# Patient Record
Sex: Female | Born: 1950 | Race: White | Hispanic: No | Marital: Married | Smoking: Never smoker
Health system: Southern US, Community
[De-identification: ages and names within clinical notes are randomized; demographics above are authoritative.]

## PROBLEM LIST (undated history)

## (undated) DIAGNOSIS — M1990 Unspecified osteoarthritis, unspecified site: Secondary | ICD-10-CM

## (undated) DIAGNOSIS — M19079 Primary osteoarthritis, unspecified ankle and foot: Secondary | ICD-10-CM

## (undated) DIAGNOSIS — K2270 Barrett's esophagus without dysplasia: Secondary | ICD-10-CM

## (undated) DIAGNOSIS — N951 Menopausal and female climacteric states: Secondary | ICD-10-CM

## (undated) DIAGNOSIS — J45990 Exercise induced bronchospasm: Secondary | ICD-10-CM

## (undated) DIAGNOSIS — Z860100 Personal history of colon polyps, unspecified: Secondary | ICD-10-CM

## (undated) DIAGNOSIS — IMO0002 Reserved for concepts with insufficient information to code with codable children: Secondary | ICD-10-CM

## (undated) DIAGNOSIS — N6019 Diffuse cystic mastopathy of unspecified breast: Secondary | ICD-10-CM

## (undated) DIAGNOSIS — D649 Anemia, unspecified: Secondary | ICD-10-CM

## (undated) DIAGNOSIS — B019 Varicella without complication: Secondary | ICD-10-CM

## (undated) DIAGNOSIS — D72819 Decreased white blood cell count, unspecified: Secondary | ICD-10-CM

## (undated) DIAGNOSIS — G4700 Insomnia, unspecified: Secondary | ICD-10-CM

## (undated) DIAGNOSIS — K5909 Other constipation: Secondary | ICD-10-CM

## (undated) DIAGNOSIS — E669 Obesity, unspecified: Secondary | ICD-10-CM

## (undated) DIAGNOSIS — I10 Essential (primary) hypertension: Secondary | ICD-10-CM

## (undated) DIAGNOSIS — K219 Gastro-esophageal reflux disease without esophagitis: Secondary | ICD-10-CM

## (undated) DIAGNOSIS — A630 Anogenital (venereal) warts: Secondary | ICD-10-CM

## (undated) DIAGNOSIS — C50919 Malignant neoplasm of unspecified site of unspecified female breast: Secondary | ICD-10-CM

## (undated) DIAGNOSIS — B059 Measles without complication: Secondary | ICD-10-CM

## (undated) DIAGNOSIS — B269 Mumps without complication: Secondary | ICD-10-CM

## (undated) DIAGNOSIS — E785 Hyperlipidemia, unspecified: Secondary | ICD-10-CM

## (undated) DIAGNOSIS — H269 Unspecified cataract: Secondary | ICD-10-CM

## (undated) DIAGNOSIS — F329 Major depressive disorder, single episode, unspecified: Secondary | ICD-10-CM

## (undated) DIAGNOSIS — J45909 Unspecified asthma, uncomplicated: Secondary | ICD-10-CM

## (undated) DIAGNOSIS — J40 Bronchitis, not specified as acute or chronic: Secondary | ICD-10-CM

## (undated) DIAGNOSIS — F419 Anxiety disorder, unspecified: Secondary | ICD-10-CM

## (undated) DIAGNOSIS — F3289 Other specified depressive episodes: Secondary | ICD-10-CM

## (undated) DIAGNOSIS — G43909 Migraine, unspecified, not intractable, without status migrainosus: Secondary | ICD-10-CM

## (undated) HISTORY — PX: OTHER SURGICAL HISTORY: SHX169

## (undated) HISTORY — DX: Unspecified cataract: H26.9

## (undated) HISTORY — PX: CHOLECYSTECTOMY: SHX55

## (undated) HISTORY — PX: NASAL SEPTUM SURGERY: SHX37

## (undated) HISTORY — DX: Menopausal and female climacteric states: N95.1

## (undated) HISTORY — DX: Essential (primary) hypertension: I10

## (undated) HISTORY — PX: COLONOSCOPY: SHX174

## (undated) HISTORY — DX: Obesity, unspecified: E66.9

## (undated) HISTORY — DX: Unspecified asthma, uncomplicated: J45.909

## (undated) HISTORY — DX: Measles without complication: B05.9

## (undated) HISTORY — DX: Anemia, unspecified: D64.9

## (undated) HISTORY — DX: Malignant neoplasm of unspecified site of unspecified female breast: C50.919

## (undated) HISTORY — PX: FOOT SURGERY: SHX648

## (undated) HISTORY — DX: Personal history of colonic polyps: Z86.010

## (undated) HISTORY — DX: Hyperlipidemia, unspecified: E78.5

## (undated) HISTORY — DX: Primary osteoarthritis, unspecified ankle and foot: M19.079

## (undated) HISTORY — DX: Varicella without complication: B01.9

## (undated) HISTORY — PX: CATARACT EXTRACTION, BILATERAL: SHX1313

## (undated) HISTORY — DX: Unspecified osteoarthritis, unspecified site: M19.90

## (undated) HISTORY — DX: Other specified depressive episodes: F32.89

## (undated) HISTORY — DX: Decreased white blood cell count, unspecified: D72.819

## (undated) HISTORY — DX: Diffuse cystic mastopathy of unspecified breast: N60.19

## (undated) HISTORY — PX: EYE SURGERY: SHX253

## (undated) HISTORY — PX: JOINT REPLACEMENT: SHX530

## (undated) HISTORY — PX: BUNIONECTOMY: SHX129

## (undated) HISTORY — DX: Mumps without complication: B26.9

## (undated) HISTORY — DX: Anogenital (venereal) warts: A63.0

## (undated) HISTORY — DX: Gastro-esophageal reflux disease without esophagitis: K21.9

## (undated) HISTORY — DX: Anxiety disorder, unspecified: F41.9

## (undated) HISTORY — DX: Insomnia, unspecified: G47.00

## (undated) HISTORY — PX: BREAST SURGERY: SHX581

## (undated) HISTORY — PX: TOTAL HIP ARTHROPLASTY: SHX124

## (undated) HISTORY — DX: Reserved for concepts with insufficient information to code with codable children: IMO0002

## (undated) HISTORY — DX: Migraine, unspecified, not intractable, without status migrainosus: G43.909

## (undated) HISTORY — DX: Personal history of colon polyps, unspecified: Z86.0100

## (undated) HISTORY — DX: Barrett's esophagus without dysplasia: K22.70

## (undated) HISTORY — DX: Major depressive disorder, single episode, unspecified: F32.9

## (undated) HISTORY — DX: Exercise induced bronchospasm: J45.990

---

## 1976-06-22 HISTORY — PX: DILATION AND CURETTAGE OF UTERUS: SHX78

## 1998-01-02 ENCOUNTER — Other Ambulatory Visit: Admission: RE | Admit: 1998-01-02 | Discharge: 1998-01-02 | Payer: Self-pay | Admitting: Gastroenterology

## 1999-02-05 ENCOUNTER — Other Ambulatory Visit: Admission: RE | Admit: 1999-02-05 | Discharge: 1999-02-05 | Payer: Self-pay | Admitting: Obstetrics and Gynecology

## 2000-03-01 ENCOUNTER — Other Ambulatory Visit: Admission: RE | Admit: 2000-03-01 | Discharge: 2000-03-01 | Payer: Self-pay | Admitting: Gastroenterology

## 2000-09-30 ENCOUNTER — Encounter: Payer: Self-pay | Admitting: Orthopaedic Surgery

## 2000-10-07 ENCOUNTER — Inpatient Hospital Stay (HOSPITAL_COMMUNITY): Admission: RE | Admit: 2000-10-07 | Discharge: 2000-10-12 | Payer: Self-pay | Admitting: Orthopaedic Surgery

## 2001-04-25 ENCOUNTER — Other Ambulatory Visit: Admission: RE | Admit: 2001-04-25 | Discharge: 2001-04-25 | Payer: Self-pay | Admitting: Obstetrics and Gynecology

## 2002-04-27 ENCOUNTER — Other Ambulatory Visit: Admission: RE | Admit: 2002-04-27 | Discharge: 2002-04-27 | Payer: Self-pay | Admitting: Obstetrics and Gynecology

## 2003-05-22 ENCOUNTER — Other Ambulatory Visit: Admission: RE | Admit: 2003-05-22 | Discharge: 2003-05-22 | Payer: Self-pay | Admitting: Obstetrics and Gynecology

## 2009-04-22 LAB — HM MAMMOGRAPHY

## 2010-04-16 LAB — HM PAP SMEAR: HM Pap smear: NORMAL

## 2011-03-23 LAB — HM PAP SMEAR: HM Pap smear: NORMAL

## 2011-07-03 ENCOUNTER — Ambulatory Visit (INDEPENDENT_AMBULATORY_CARE_PROVIDER_SITE_OTHER): Payer: 59

## 2011-07-03 ENCOUNTER — Encounter: Payer: Self-pay | Admitting: Gastroenterology

## 2011-07-03 DIAGNOSIS — D649 Anemia, unspecified: Secondary | ICD-10-CM

## 2011-08-04 ENCOUNTER — Ambulatory Visit (AMBULATORY_SURGERY_CENTER): Payer: Self-pay | Admitting: *Deleted

## 2011-08-04 VITALS — Ht 64.0 in | Wt 154.0 lb

## 2011-08-04 DIAGNOSIS — Z1211 Encounter for screening for malignant neoplasm of colon: Secondary | ICD-10-CM

## 2011-08-04 MED ORDER — PEG-KCL-NACL-NASULF-NA ASC-C 100 G PO SOLR: ORAL | Status: DC

## 2011-08-05 LAB — LIPID PANEL
HDL: 88 mg/dL — AB (ref 35–70)
LDL Cholesterol: 95 mg/dL
Triglycerides: 60 mg/dL (ref 40–160)

## 2011-08-13 ENCOUNTER — Other Ambulatory Visit: Payer: Self-pay | Admitting: Gastroenterology

## 2011-08-21 LAB — HM COLONOSCOPY

## 2011-08-25 ENCOUNTER — Encounter: Payer: Self-pay | Admitting: Gastroenterology

## 2011-08-25 ENCOUNTER — Ambulatory Visit (AMBULATORY_SURGERY_CENTER): Payer: 59 | Admitting: Gastroenterology

## 2011-08-25 VITALS — BP 118/56 | HR 57 | Temp 96.3°F | Resp 18 | Ht 64.0 in | Wt 154.0 lb

## 2011-08-25 DIAGNOSIS — Z1211 Encounter for screening for malignant neoplasm of colon: Secondary | ICD-10-CM

## 2011-08-25 MED ORDER — SODIUM CHLORIDE 0.9 % IV SOLN: 500.0000 mL | INTRAVENOUS | Status: DC

## 2011-08-25 MED ORDER — DEXTROSE 5 % IV SOLN: INTRAVENOUS | Status: DC

## 2011-08-25 NOTE — Progress Notes (Signed)
Patient did not have preoperative order for IV antibiotic SSI prophylaxis. (G8918)  Patient did not experience any of the following events: a burn prior to discharge; a fall within the facility; wrong site/side/patient/procedure/implant event; or a hospital transfer or hospital admission upon discharge from the facility. (G8907)  

## 2011-08-25 NOTE — Progress Notes (Signed)
PT STATES THAT SHE HAS LOST 75LBS AND HER DIABETES IS UNDER CONTROL BUT SHE STILL TAKES HER METFORMIN HERE AND THERE. SHE HASNT TAKEN IT IN SEVERAL DAYS .  BLOOD SUGAR IS 75 IN THE ADMITTING AREA. PT DENIES ANY S/S. STATES SHE FEELS FINE. DR Russella Dar MADE AWARE OF SITUATION AND GAVE ORDER TO HANG D5W ONLY. EWM  REPEAT BLOOD SUGAR 109. PT CONTINUES TO STATE SHE IS ASYMPTOMATIC. EWM

## 2011-08-25 NOTE — Op Note (Signed)
[facility] [address] [place], [place]  [address]  COLONOSCOPY PROCEDURE REPORT  PATIENT:  Alisha, Matthews  MR#:  [id number] BIRTHDATE:  [birth year], 60 yrs. old  GENDER:  female ENDOSCOPIST:  Judie Petit T. Russella Dar, MD, [facility]  PROCEDURE DATE:  08/25/2011 PROCEDURE:  Colonoscopy 19147 ASA CLASS:  Class II INDICATIONS:  1) Routine Risk Screening MEDICATIONS:   MAC sedation, administered by CRNA, propofol (Diprivan) 120 mg IV DESCRIPTION OF PROCEDURE:   After the risks benefits and alternatives of the procedure were thoroughly explained, informed consent was obtained.  Digital rectal exam was performed and revealed external hemorrhoids.   The LB [id number]-[id number] endoscope was introduced through the anus and advanced to the cecum, which was identified by both the appendix and ileocecal valve, without limitations.  The quality of the prep was good, using MoviPrep.  The instrument was then slowly withdrawn as the colon was fully examined. <<PROCEDUREIMAGES>> FINDINGS:  Moderate diverticulosis was found in the sigmoid colon. Otherwise normal colonoscopy without other polyps, masses, vascular ectasias, or inflammatory changes.   Retroflexed views in the rectum revealed internal hemorrhoids, moderate.  The time to cecum =  3  minutes. The scope was then withdrawn (time =  11 min) from the patient and the procedure completed.  COMPLICATIONS:  None  ENDOSCOPIC IMPRESSION: 1) Moderate diverticulosis in the sigmoid colon 2) Internal and external hemorrhoids  RECOMMENDATIONS: 1) High fiber diet with liberal fluid intake. 2) Continue current colorectal screening for "routine risk" patients with a repeat colonoscopy in 10 years.  Venita Lick. Russella Dar, MD, Clement[facility] Graham  CC:  Nilda Simmer, MD  n. Rosalie DoctorVenita Lick. Tahjanae Blankenburg at 08/25/2011 11:18 AM  [place], [name], [place] [phone]

## 2011-08-25 NOTE — Patient Instructions (Signed)

## 2011-08-26 ENCOUNTER — Telehealth: Payer: Self-pay | Admitting: *Deleted

## 2011-08-26 NOTE — Telephone Encounter (Signed)
  Follow up Call-  Call back number 08/25/2011  Post procedure Call Back phone  # [phone]  Permission to leave phone message Yes     Patient questions:  Do you have a fever, pain , or abdominal swelling? no Pain Score  0 *  Have you tolerated food without any problems? yes  Have you been able to return to your normal activities? yes  Do you have any questions about your discharge instructions: Diet   no Medications  no Follow up visit  no  Do you have questions or concerns about your Care? no  Actions: * If pain score is 4 or above: No action needed, pain <4.

## 2011-11-04 LAB — BASIC METABOLIC PANEL
Glucose: 101 mg/dL
Potassium: 4.2 mmol/L (ref 3.4–5.3)

## 2011-11-04 LAB — HEMOGLOBIN A1C: Hgb A1c MFr Bld: 6 % (ref 4.0–6.0)

## 2011-11-04 LAB — LIPID PANEL
LDL Cholesterol: 98 mg/dL
Triglycerides: 61 mg/dL (ref 40–160)

## 2011-11-04 LAB — CBC AND DIFFERENTIAL
HCT: 39 % (ref 36–46)
Neutrophils Absolute: 1 /uL
Platelets: 202 10*3/uL (ref 150–399)
WBC: 3.3 10^3/mL

## 2012-01-31 ENCOUNTER — Encounter: Payer: Self-pay | Admitting: Family Medicine

## 2012-02-02 ENCOUNTER — Encounter: Payer: Self-pay | Admitting: *Deleted

## 2012-02-08 ENCOUNTER — Ambulatory Visit (INDEPENDENT_AMBULATORY_CARE_PROVIDER_SITE_OTHER): Payer: 59 | Admitting: Family Medicine

## 2012-02-08 VITALS — BP 112/70 | HR 68 | Temp 99.0°F | Resp 18 | Ht 62.5 in | Wt 149.0 lb

## 2012-02-08 DIAGNOSIS — I10 Essential (primary) hypertension: Secondary | ICD-10-CM

## 2012-02-08 DIAGNOSIS — E7800 Pure hypercholesterolemia, unspecified: Secondary | ICD-10-CM

## 2012-02-08 DIAGNOSIS — F411 Generalized anxiety disorder: Secondary | ICD-10-CM

## 2012-02-08 DIAGNOSIS — E119 Type 2 diabetes mellitus without complications: Secondary | ICD-10-CM

## 2012-02-08 DIAGNOSIS — E785 Hyperlipidemia, unspecified: Secondary | ICD-10-CM

## 2012-02-08 DIAGNOSIS — F329 Major depressive disorder, single episode, unspecified: Secondary | ICD-10-CM

## 2012-02-08 LAB — CK: Total CK: 153 U/L (ref 7–177)

## 2012-02-08 LAB — CBC WITH DIFFERENTIAL/PLATELET
HCT: 36.8 % (ref 36.0–46.0)
Hemoglobin: 12.6 g/dL (ref 12.0–15.0)
Lymphs Abs: 1.6 10*3/uL (ref 0.7–4.0)
Monocytes Absolute: 0.3 10*3/uL (ref 0.1–1.0)
Monocytes Relative: 8 % (ref 3–12)
Neutro Abs: 1.7 10*3/uL (ref 1.7–7.7)
Neutrophils Relative %: 45 % (ref 43–77)
RBC: 4.27 MIL/uL (ref 3.87–5.11)

## 2012-02-08 LAB — COMPREHENSIVE METABOLIC PANEL
AST: 24 U/L (ref 0–37)
Alkaline Phosphatase: 53 U/L (ref 39–117)
BUN: 7 mg/dL (ref 6–23)
Creat: 0.69 mg/dL (ref 0.50–1.10)
Potassium: 4.3 mEq/L (ref 3.5–5.3)
Sodium: 136 mEq/L (ref 135–145)
Total Bilirubin: 0.7 mg/dL (ref 0.3–1.2)

## 2012-02-08 LAB — LIPID PANEL: HDL: 79 mg/dL (ref >39)

## 2012-02-08 LAB — POCT GLYCOSYLATED HEMOGLOBIN (HGB A1C): Hemoglobin A1C: 5.7

## 2012-02-08 NOTE — Progress Notes (Signed)
Subjective:    Patient ID: Alisha Matthews, female    DOB: 1950/07/05, 61 y.o.   MRN: [id number]  HPIThis 61 y.o. female presents for three month follow-up for the following:  1.  DMII:  No change in medications at last visit three months ago; taking500mg  Metformin daily; will take one extra tablet if splurges. Last HgbA1c of 6.0.  Compliance with medication; good tolerance of medication; good symptom control.  No hypoglycemic episodes.  Denies polydipsia, polyuria, weight changes, numbness/tingling of extremities.   Exercises stair stepper daily.    2. Hyperlipidemia:  Last visit three months ago; no changes to management made at last visit; reports good compliance with medication, good tolerance of medication, and good symptom control. Denies chest pain, palpitations, DOE, leg swelling, headache, dizziness, focal weakness, or numbness/tingling.  3.  Depression: three month follow-up for depression; plan at last visit was to decrease Paxil to 40mg  1/2 tablet daily; always worried about husband and his commute.  Going down to 2 days of work per week on 06/2012.  Taking Paxil 20mg  daily.  Taking Benadryl and Xanax alternating for insomnia; needs to sleep.  Excessive worry.  Working 32 hours per week currently; work is main stressor at this time.  Has started painting again; also taking [place] lessons with husband; plans to take dance lessons with husband.  Has lots of plans when goes down to part time.  Wants husband to retire in 07/2012.  4.  Migraines: having one every three months due to intensity of the job; stress is job related.  Hopes that decreasing number of work hours per week will decrease stress.  5.  HTN: three month follow-up of HTN; home/work blood pressure running 116/80-100/68.  Rare dizziness.  Compliance with medication; good tolerance of medication; good symptom control.      Review of Systems  Constitutional: Negative for fever, fatigue and unexpected weight change.  Eyes:  Negative for visual disturbance.  Respiratory: Negative for cough, chest tightness and shortness of breath.   Cardiovascular: Negative for chest pain, palpitations and leg swelling.  Musculoskeletal: Positive for arthralgias.  Neurological: Positive for headaches. Negative for dizziness, syncope, facial asymmetry, speech difficulty, weakness, light-headedness and numbness.  Psychiatric/Behavioral: Positive for disturbed wake/sleep cycle. Negative for suicidal ideas, self-injury and dysphoric mood. The patient is nervous/anxious.     Past Medical History  Diagnosis Date  . Anxiety   . Arthritis   . Diabetes mellitus     borderline  . GERD (gastroesophageal reflux disease)   . Hyperlipidemia   . Insomnia, unspecified   . Leukocytopenia, unspecified   . Barrett's esophagus   . Breast fibrocystic disorder   . Genital warts   . Osteoarthrosis, unspecified whether generalized or localized, ankle and foot     ankle/foot  . Exercise induced bronchospasm   . Personal history of colonic polyps   . Migraine, unspecified, without mention of intractable migraine without mention of status migrainosus   . Symptomatic menopausal or female climacteric states   . Obesity, unspecified   . Essential hypertension, benign   . Depressive disorder, not elsewhere classified   . Anemia, unspecified   . Chicken pox   . Measles   . Mumps     Past Surgical History  Procedure Date  . Cholecystectomy   . Bunionectomy     3 surgeries on both feet  . Nasal septum surgery   . Total hip arthroplasty     left      Perthes disease  .  Foot surgery     Left-revision  . Dilation and curettage of uterus 1978    Prior to Admission medications   Medication Sig Start Date End Date Taking? Authorizing Provider  albuterol (PROVENTIL HFA;VENTOLIN HFA) 108 (90 BASE) MCG/ACT inhaler Inhale 2 puffs into the lungs every 6 (six) hours as needed. Asthmatic attack   Yes Historical Provider, MD  ALPRAZolam Prudy Feeler) 0.5  MG tablet Take 0.5 mg by mouth at bedtime as needed.   Yes Historical Provider, MD  atorvastatin (LIPITOR) 40 MG tablet Take 40 mg by mouth daily.   Yes Historical Provider, MD  clindamycin (CLEOCIN) 150 MG capsule Take 150 mg by mouth as needed. Takes 4 as needed prior to dental appt   Yes Historical Provider, MD  diclofenac sodium (VOLTAREN) 1 % GEL Apply 1 application topically as needed. Joint pain ankle   Yes Historical Provider, MD  Diclofenac-Misoprostol (ARTHROTEC PO) Take 1 tablet by mouth 3 (three) times daily.   Yes Historical Provider, MD  diphenhydrAMINE (BENADRYL) 25 MG tablet Take 25 mg by mouth at bedtime as needed. When not taking xanax.   Yes Historical Provider, MD  esomeprazole (NEXIUM) 40 MG capsule Take 40 mg by mouth 2 (two) times daily.   Yes Historical Provider, MD  estradiol (ESTRACE) 0.1 MG/GM vaginal cream Place 2 g vaginally once a week.   Yes Historical Provider, MD  frovatriptan (FROVA) 2.5 MG tablet Take 2.5 mg by mouth as needed. If recurs, may repeat after 2 hours. Max of 3 tabs in 24 hours.   Yes Historical Provider, MD  lisinopril (PRINIVIL,ZESTRIL) 5 MG tablet Take 5 mg by mouth daily.   Yes Historical Provider, MD  metFORMIN (GLUMETZA) 500 MG (MOD) 24 hr tablet Take 500 mg by mouth daily with breakfast.   Yes Historical Provider, MD  PARoxetine (PAXIL) 40 MG tablet Take 40 mg by mouth daily.   Yes Historical Provider, MD  aspirin 81 MG tablet Take 81 mg by mouth daily.    Historical Provider, MD    Allergies  Allergen Reactions  . Penicillins Rash    History   Social History  . Marital Status: Married    Spouse Name: N/A    Number of Children: 0  . 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 Smoking status: Never Smoker   . Smokeless tobacco: Never Used  . Alcohol Use: No  . Drug Use: No  . Sexually Active: Not on file   Other Topics Concern  . Not on file   Social  History Narrative   Married x 16 years happily, second marriage. No domestic abuse; 6 cats; no children. Moderate caffeine use. Exercise plan: gardening and walking.    Family History  Problem Relation Age of Onset  . Hypertension Mother   . Diabetes Mother   . Heart failure Father   . Heart failure Mother   . Depression Brother        Objective:   Physical Exam  Constitutional: She is oriented to person, place, and time. She appears well-developed and well-nourished. No distress.  HENT:  Head: Normocephalic and atraumatic.  Eyes: Conjunctivae and EOM are normal. Pupils are equal, round, and reactive to light.  Neck: Normal range of motion. Neck supple. No thyromegaly present.  Cardiovascular: Normal rate, regular rhythm, normal heart sounds and intact distal pulses.   No murmur heard. Pulmonary/Chest: Effort normal and breath sounds normal.  Abdominal: Soft. Bowel sounds are normal. She exhibits no distension and no mass. There is no tenderness. There is no rebound and no guarding.  Lymphadenopathy:    She has no cervical adenopathy.  Neurological: She is alert and oriented to person, place, and time. No cranial nerve deficit. She exhibits normal muscle tone.  Skin: Skin is warm and dry. She is not diaphoretic.  Psychiatric: She has a normal mood and affect. Her behavior is normal. Judgment and thought content normal.           Results for orders placed in visit on 02/08/12  CBC WITH DIFFERENTIAL      Component Value Range   WBC 3.7 (*) 4.0 - 10.5 K/uL   RBC 4.27  3.87 - 5.11 MIL/uL   Hemoglobin 12.6  12.0 - 15.0 g/dL   HCT 91.4  78.2 - 95.6 %   MCV 86.2  78.0 - 100.0 fL   MCH 29.5  26.0 - 34.0 pg   MCHC 34.2  30.0 - 36.0 g/dL   RDW 21.3  08.6 - 57.8 %   Platelets 202  150 - 400 K/uL   Neutrophils Relative 45  43 - 77 %   Neutro Abs 1.7  1.7 - 7.7 K/uL   Lymphocytes Relative 43  12 - 46 %   Lymphs Abs 1.6  0.7 - 4.0 K/uL   Monocytes Relative 8  3 - 12 %    Monocytes Absolute 0.3  0.1 - 1.0 K/uL   Eosinophils Relative 3  0 - 5 %   Eosinophils Absolute 0.1  0.0 - 0.7 K/uL   Basophils Relative 1  0 - 1 %   Basophils Absolute 0.0  0.0 - 0.1 K/uL   Smear Review Criteria for review not met    CK      Component Value Range   Total CK 153  7 - 177 U/L  COMPREHENSIVE METABOLIC PANEL      Component Value Range   Sodium 136  135 - 145 mEq/L   Potassium 4.3  3.5 - 5.3 mEq/L   Chloride 102  96 - 112 mEq/L   CO2 28  19 - 32 mEq/L   Glucose, Bld 95  70 - 99 mg/dL   BUN 7  6 - 23 mg/dL   Creat 4.69  6.29 - 5.28 mg/dL   Total Bilirubin 0.7  0.3 - 1.2 mg/dL   Alkaline Phosphatase 53  39 - 117 U/L   AST 24  0 - 37 U/L   ALT 22  0 - 35 U/L   Total Protein 6.7  6.0 - 8.3 g/dL   Albumin 4.4  3.5 - 5.2 g/dL   Calcium 9.3  8.4 - 41.3 mg/dL  POCT GLYCOSYLATED HEMOGLOBIN (HGB A1C)      Component Value Range   Hemoglobin A1C 5.7    LIPID PANEL      Component Value Range   Cholesterol 186  0 - 200 mg/dL   Triglycerides 75  <244 mg/dL   HDL 79  >01 mg/dL   Total CHOL/HDL Ratio 2.4     VLDL 15  0 - 40 mg/dL   LDL Cholesterol 92  0 - 99 mg/dL   Assessment & Plan:   1. Diabetes mellitus  CBC with Differential, CK, Comprehensive metabolic panel, HgB A1c  2. Hyperlipidemia  CBC with Differential, CK, Comprehensive metabolic panel, Lipid panel  3. Hypertension    4. Anxiety and depression  1. DMII:  Controlled; no change in medications; obtain labs.  F/u 3 months. 2.  HTN: controlled; no change in medications; obtain labs; goal BP<130/80 due to DMII. 3.  Hyperlipidemia:  Controlled; no change in medications; obtain labs.  Goal LDL< 100 due to DMII. 4.  Depression with anxiety: controlled; no change in medications; continue Paxil 20mg  daily.  Continue Xanax alternating with Benadryl qhs PRN insomnia.  Plans to decrease work hours to two days per week in 06/2012.

## 2012-02-11 NOTE — Progress Notes (Signed)
Reviewed and agree.

## 2012-04-25 ENCOUNTER — Telehealth: Payer: Self-pay | Admitting: Family Medicine

## 2012-04-25 NOTE — Telephone Encounter (Signed)
DR Katrinka Blazing,  THIS IS JUST A TEST

## 2012-05-09 ENCOUNTER — Ambulatory Visit (INDEPENDENT_AMBULATORY_CARE_PROVIDER_SITE_OTHER): Payer: 59 | Admitting: Family Medicine

## 2012-05-09 ENCOUNTER — Encounter: Payer: Self-pay | Admitting: Family Medicine

## 2012-05-09 VITALS — BP 106/74 | HR 70 | Temp 99.4°F | Resp 16 | Ht 62.5 in | Wt 149.0 lb

## 2012-05-09 DIAGNOSIS — E119 Type 2 diabetes mellitus without complications: Secondary | ICD-10-CM

## 2012-05-09 DIAGNOSIS — F418 Other specified anxiety disorders: Secondary | ICD-10-CM

## 2012-05-09 DIAGNOSIS — F341 Dysthymic disorder: Secondary | ICD-10-CM

## 2012-05-09 DIAGNOSIS — I10 Essential (primary) hypertension: Secondary | ICD-10-CM

## 2012-05-09 DIAGNOSIS — E7800 Pure hypercholesterolemia, unspecified: Secondary | ICD-10-CM

## 2012-05-09 LAB — CBC WITH DIFFERENTIAL/PLATELET
Eosinophils Relative: 2 % (ref 0–5)
HCT: 36 % (ref 36.0–46.0)
Lymphocytes Relative: 48 % — ABNORMAL HIGH (ref 12–46)
Lymphs Abs: 2 10*3/uL (ref 0.7–4.0)
MCV: 88 fL (ref 78.0–100.0)
Monocytes Absolute: 0.3 10*3/uL (ref 0.1–1.0)
RDW: 12.9 % (ref 11.5–15.5)
WBC: 4.3 10*3/uL (ref 4.0–10.5)

## 2012-05-09 LAB — COMPREHENSIVE METABOLIC PANEL
Albumin: 4.5 g/dL (ref 3.5–5.2)
BUN: 9 mg/dL (ref 6–23)
CO2: 28 mEq/L (ref 19–32)
Calcium: 9.4 mg/dL (ref 8.4–10.5)
Chloride: 102 mEq/L (ref 96–112)
Glucose, Bld: 95 mg/dL (ref 70–99)
Potassium: 4 mEq/L (ref 3.5–5.3)

## 2012-05-09 LAB — HEMOGLOBIN A1C: Mean Plasma Glucose: 128 mg/dL — ABNORMAL HIGH (ref <117)

## 2012-05-09 LAB — LIPID PANEL: Cholesterol: 181 mg/dL (ref 0–200)

## 2012-05-09 NOTE — Patient Instructions (Addendum)
1. Type II or unspecified type diabetes mellitus without mention of complication, not stated as uncontrolled  CBC with Differential, Hemoglobin A1c  2. Pure hypercholesterolemia  Lipid panel  3. Essential hypertension, benign  CK, Comprehensive metabolic panel  4. Depression with anxiety

## 2012-05-09 NOTE — Progress Notes (Signed)
[address]   [place], [place]  [address]   [phone]  Subjective:    Patient ID: Alisha Matthews, female    DOB: 1950-09-06, 61 y.o.   MRN: [id number]  HPIThis 61 y.o. female presents for evaluation of the following:  1.  DMII:  Three month follow-up; no changes to management made at last visit.  Not checking sugars regularly.  Reports good compliance with medication; good tolerance to medication; good symptom control.  2.  HTN:  Three month follow-up; no changes to management made at last visit; reports good compliance and good tolerance to medications.  3.  Hyperlipidemia:  Three month follow-up; reports good tolerance to medication; good symptom control; good compliance.  Asymptomatic.  4.  Depression with anxiety:  Lots of stressors and struggling with stressors.  Previously worked at [employer]; great team work; worked there x 15 years.  Faith helps with positive attitude.  Increased Paxil to 40mg  daily.  Had a rough time.  Still struggling with death of father.   Review of Systems  Constitutional: Negative for fever, chills, diaphoresis and fatigue.  Respiratory: Negative for shortness of breath, wheezing and stridor.   Cardiovascular: Negative for chest pain, palpitations and leg swelling.  Gastrointestinal: Negative for nausea, vomiting, abdominal pain and constipation.  Skin: Negative for rash and wound.  Neurological: Negative for dizziness, tremors, syncope, facial asymmetry, speech difficulty, weakness, light-headedness, numbness and headaches.  Psychiatric/Behavioral: Positive for sleep disturbance and dysphoric mood. Negative for suicidal ideas and self-injury. The patient is nervous/anxious.         Past Medical History  Diagnosis Date  . Anxiety   . Arthritis   . Diabetes mellitus     borderline  . GERD (gastroesophageal reflux disease)   . Hyperlipidemia   . Insomnia, unspecified   . Leukocytopenia, unspecified   . Barrett's esophagus   . Breast  fibrocystic disorder   . Genital warts   . Osteoarthrosis, unspecified whether generalized or localized, ankle and foot     ankle/foot  . Exercise induced bronchospasm   . Personal history of colonic polyps   . Migraine, unspecified, without mention of intractable migraine without mention of status migrainosus   . Symptomatic menopausal or female climacteric states   . Obesity, unspecified   . Essential hypertension, benign   . Depressive disorder, not elsewhere classified   . Anemia, unspecified   . Chicken pox   . Measles   . Mumps     Past Surgical History  Procedure Date  . Cholecystectomy   . Bunionectomy     3 surgeries on both feet  . Nasal septum surgery   . Total hip arthroplasty     left      Perthes disease  . Foot surgery     Left-revision  . Dilation and curettage of uterus 1978    Prior to Admission medications   Medication Sig Start Date End Date Taking? Authorizing Provider  albuterol (PROVENTIL HFA;VENTOLIN HFA) 108 (90 BASE) MCG/ACT inhaler Inhale 2 puffs into the lungs every 6 (six) hours as needed. Asthmatic attack   Yes Historical Provider, MD  ALPRAZolam Prudy Feeler) 0.5 MG tablet Take 0.5 mg by mouth at bedtime as needed.   Yes Historical Provider, MD  aspirin 81 MG tablet Take 81 mg by mouth daily.   Yes Historical Provider, MD  atorvastatin (LIPITOR) 40 MG tablet Take 40 mg by mouth daily.   Yes Historical Provider, MD  clindamycin (CLEOCIN) 150 MG capsule  Take 150 mg by mouth as needed. Takes 4 as needed prior to dental appt   Yes Historical Provider, MD  diclofenac sodium (VOLTAREN) 1 % GEL Apply 1 application topically as needed. Joint pain ankle   Yes Historical Provider, MD  Diclofenac-Misoprostol (ARTHROTEC PO) Take 1 tablet by mouth 3 (three) times daily.   Yes Historical Provider, MD  diphenhydrAMINE (BENADRYL) 25 MG tablet Take 25 mg by mouth at bedtime as needed. When not taking xanax.   Yes Historical Provider, MD  esomeprazole (NEXIUM) 40 MG  capsule Take 40 mg by mouth 2 (two) times daily.   Yes Historical Provider, MD  estradiol (ESTRACE) 0.1 MG/GM vaginal cream Place 2 g vaginally once a week.   Yes Historical Provider, MD  frovatriptan (FROVA) 2.5 MG tablet Take 2.5 mg by mouth as needed. If recurs, may repeat after 2 hours. Max of 3 tabs in 24 hours.   Yes Historical Provider, MD  lisinopril (PRINIVIL,ZESTRIL) 5 MG tablet Take 5 mg by mouth daily.   Yes Historical Provider, MD  metFORMIN (GLUMETZA) 500 MG (MOD) 24 hr tablet Take 500 mg by mouth daily with breakfast.   Yes Historical Provider, MD  PARoxetine (PAXIL) 40 MG tablet Take 40 mg by mouth daily.   Yes Historical Provider, MD    Allergies  Allergen Reactions  . Penicillins Rash    History   Social History  . Marital Status: Married    Spouse Name: N/A    Number of Children: 0  . 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 Smoking status: Never Smoker   . Smokeless tobacco: Never Used  . Alcohol Use: No  . Drug Use: No  . Sexually Active: Not on file   Other Topics Concern  . Not on file   Social History Narrative   Married x 16 years happily, second marriage. No domestic abuse; 6 cats; no children. Moderate caffeine use. Exercise plan: gardening and walking.    Family History  Problem Relation Age of Onset  . Hypertension Mother   . Diabetes Mother   . Heart failure Father   . Heart failure Mother   . Depression Brother     Objective:   Physical Exam  Nursing note and vitals reviewed. Constitutional: She is oriented to person, place, and time. She appears well-developed and well-nourished. No distress.  Eyes: Conjunctivae are normal. Pupils are equal, round, and reactive to light.  Neck: Normal range of motion. Neck supple. No JVD present. No thyromegaly present.  Cardiovascular: Normal rate, regular rhythm, normal heart sounds and intact distal pulses.  Exam reveals  no gallop and no friction rub.   No murmur heard. Pulmonary/Chest: Effort normal and breath sounds normal. She has no wheezes. She has no rales.  Abdominal: Soft. Bowel sounds are normal. There is no tenderness. There is no rebound and no guarding.  Lymphadenopathy:    She has no cervical adenopathy.  Neurological: She is alert and oriented to person, place, and time. No cranial nerve deficit. She exhibits normal muscle tone. Coordination normal.  Skin: Skin is warm and dry. No rash noted. She is not diaphoretic. No erythema.  Psychiatric: She has a normal mood and affect. Her behavior is normal. Judgment and thought content normal.       Assessment & Plan:  Type II or unspecified type diabetes mellitus without mention of complication, not stated as uncontrolled - Plan: CBC  with Differential, Hemoglobin A1c  Pure hypercholesterolemia - Plan: Lipid panel  Essential hypertension, benign - Plan: CK, Comprehensive metabolic panel  Depression with anxiety   1.  DMII: controlled; obtain labs; continue current medications. 2.  Hyperlipidemia:  Controlled; obtain labs; continue current medications. 3.  HTN: controlled; obtain labs; continue current medications. 4.  Depression with anxiety: worsening which is expected with multiple stressors over past 3 years; counseling provided; no change to medications at this time.   No orders of the defined types were placed in this encounter.

## 2012-05-12 ENCOUNTER — Encounter: Payer: Self-pay | Admitting: Radiology

## 2012-05-12 ENCOUNTER — Other Ambulatory Visit: Payer: Self-pay | Admitting: Radiology

## 2012-05-16 ENCOUNTER — Other Ambulatory Visit (HOSPITAL_COMMUNITY): Payer: Self-pay | Admitting: Radiology

## 2012-05-16 DIAGNOSIS — C50919 Malignant neoplasm of unspecified site of unspecified female breast: Secondary | ICD-10-CM

## 2012-05-20 ENCOUNTER — Ambulatory Visit (HOSPITAL_COMMUNITY)
Admission: RE | Admit: 2012-05-20 | Discharge: 2012-05-20 | Disposition: A | Discharge status: Home / Self Care | Payer: 59 | Source: Ambulatory Visit | Attending: Radiology | Admitting: Radiology

## 2012-05-20 ENCOUNTER — Ambulatory Visit (HOSPITAL_COMMUNITY): Admission: RE | Admit: 2012-05-20 | Payer: 59 | Source: Ambulatory Visit

## 2012-05-20 DIAGNOSIS — C50919 Malignant neoplasm of unspecified site of unspecified female breast: Secondary | ICD-10-CM | POA: Insufficient documentation

## 2012-05-20 MED ORDER — GADOBENATE DIMEGLUMINE 529 MG/ML IV SOLN
13.0000 mL | Freq: Once | INTRAVENOUS | Status: AC | PRN
  Administered 2012-05-20: 13 mL via INTRAVENOUS

## 2012-05-21 ENCOUNTER — Other Ambulatory Visit: Payer: Self-pay | Admitting: *Deleted

## 2012-05-21 ENCOUNTER — Ambulatory Visit (INDEPENDENT_AMBULATORY_CARE_PROVIDER_SITE_OTHER): Payer: 59 | Admitting: Physician Assistant

## 2012-05-21 VITALS — BP 106/72 | HR 70 | Temp 98.6°F | Resp 16 | Ht 63.0 in | Wt 148.0 lb

## 2012-05-21 DIAGNOSIS — N61 Mastitis without abscess: Secondary | ICD-10-CM

## 2012-05-21 DIAGNOSIS — N611 Abscess of the breast and nipple: Secondary | ICD-10-CM

## 2012-05-21 DIAGNOSIS — B9562 Methicillin resistant Staphylococcus aureus infection as the cause of diseases classified elsewhere: Secondary | ICD-10-CM

## 2012-05-21 MED ORDER — CEPHALEXIN 500 MG PO CAPS: 500.0000 mg | ORAL_CAPSULE | Freq: Three times a day (TID) | ORAL | Status: DC

## 2012-05-21 NOTE — Progress Notes (Signed)
   [address] [place] [address]   Phone [phone]  Subjective:    Patient ID: Alisha Matthews, female    DOB: Jul 27, 1950, 61 y.o.   MRN: [id number]  HPI  Pt presents to clinic with concerns about infection at biopsy site on R breast.  Pt had a bx last week and was diagnosed with invasive breast cancer - in situ and took the steri strip off and noticed some yellow d/c.  She is scheduled to see the surgeon on Monday and is worried to wait that long.  She otherwise feels fine, without fever, just local discomfort at the biopsy site.  She is sad and mad about her diagnosis.  She is unable to concentrate since being told.  It was found on routine mammogram screening.  Review of Systems  Constitutional: Negative for fever and chills.  Skin: Positive for wound.       Objective:   Physical Exam  Constitutional: She appears well-developed and well-nourished.       Tearful at times.  HENT:  Head: Normocephalic and atraumatic.  Right Ear: External ear normal.  Left Ear: External ear normal.  Pulmonary/Chest: Effort normal.  Skin:       Ecchymosis inferior to biopsy site.  Biopsy site - with pressure yellow purulence expressed.  Minimal erythema around biopsy site.  Some minimal induration.       Assessment & Plan:   1. Cellulitis and abscess of breast  Wound culture, DISCONTINUED: cephALEXin (KEFLEX) 500 MG capsule   Warm compresses.  Keflex 500mg  tid for 10 days.  Continue with plans for surgeon appt on Monday.  Answered pt questions.  Reassured pt that her currently feelings regarding her diagnosis are normal.

## 2012-05-23 ENCOUNTER — Ambulatory Visit (INDEPENDENT_AMBULATORY_CARE_PROVIDER_SITE_OTHER): Payer: Commercial Managed Care - PPO | Admitting: General Surgery

## 2012-05-23 ENCOUNTER — Encounter (INDEPENDENT_AMBULATORY_CARE_PROVIDER_SITE_OTHER): Payer: Self-pay | Admitting: General Surgery

## 2012-05-23 ENCOUNTER — Telehealth (INDEPENDENT_AMBULATORY_CARE_PROVIDER_SITE_OTHER): Payer: Self-pay | Admitting: General Surgery

## 2012-05-23 VITALS — BP 130/88 | HR 67 | Temp 98.2°F | Ht 64.0 in | Wt 153.2 lb

## 2012-05-23 DIAGNOSIS — C50219 Malignant neoplasm of upper-inner quadrant of unspecified female breast: Secondary | ICD-10-CM

## 2012-05-23 NOTE — Progress Notes (Signed)
Patient ID: Alisha Matthews, female   DOB: 05-08-1951, 61 y.o.   MRN: [id number]  Chief Complaint  Patient presents with  . Pre-op Exam    eval Rt br ca    HPI Alisha Matthews is a 61 y.o. female.  Referred by Dr. Tilda Burrow HPI 32 yof with no history related to breast or any breast related complaints who underwent routine screening mmg that showed new calcifications on the right.  Additional views shoed a cluster of numerous mildly pleomorphic calcifications.  Stereotactic biopsy was performed with clip placement.  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  This is 100% er positive, 22% pr positive and Ki67 is 6%.  Her2 neu is pending. She had a mild infection of biopsy site afterwards which is getting better now on keflex. She has also undergone mr with results below.  There is some evidence of the infection and biopsy tract but this is solitary lesion on right. There is also likely hepatic cyst recommended for u/s as well as abnormal enhancement in the left breast recommended for biopsy.  Past Medical History  Diagnosis Date  . Anxiety   . Arthritis   . Diabetes mellitus     borderline  . GERD (gastroesophageal reflux disease)   . Hyperlipidemia   . Insomnia, unspecified   . Leukocytopenia, unspecified   . Barrett's esophagus   . Breast fibrocystic disorder   . Genital warts   . Osteoarthrosis, unspecified whether generalized or localized, ankle and foot     ankle/foot  . Exercise induced bronchospasm   . Personal history of colonic polyps   . Migraine, unspecified, without mention of intractable migraine without mention of status migrainosus   . Symptomatic menopausal or female climacteric states   . Obesity, unspecified   . Essential hypertension, benign   . Depressive disorder, not elsewhere classified   . Anemia, unspecified   . Chicken pox   . Measles   . Mumps   . Cancer     Past Surgical History  Procedure Date  . Cholecystectomy   . Bunionectomy       3 surgeries on both feet  . Nasal septum surgery   . Total hip arthroplasty     left      Perthes disease  . Foot surgery     Left-revision  . Dilation and curettage of uterus 1978    Family History  Problem Relation Age of Onset  . Hypertension Mother   . Diabetes Mother   . Heart failure Father   . Heart failure Mother   . Depression Brother     Social History History  Substance Use Topics  . Smoking status: Never Smoker   . Smokeless tobacco: Never Used  . Alcohol Use: No    Allergies  Allergen Reactions  . Penicillins Rash    Current Outpatient Prescriptions  Medication Sig Dispense Refill  . albuterol (PROVENTIL HFA;VENTOLIN HFA) 108 (90 BASE) MCG/ACT inhaler Inhale 2 puffs into the lungs every 6 (six) hours as needed. Asthmatic attack      . ALPRAZolam (XANAX) 0.5 MG tablet Take 0.5 mg by mouth at bedtime as needed.      Marland Kitchen aspirin 81 MG tablet Take 81 mg by mouth daily.      Marland Kitchen atorvastatin (LIPITOR) 40 MG tablet Take 40 mg by mouth daily.      . cephALEXin (KEFLEX) 500 MG capsule Take 1 capsule (500 mg total) by mouth 3 (three) times daily.  30 capsule  0  . clindamycin (CLEOCIN) 150 MG capsule Take 150 mg by mouth as needed. Takes 4 as needed prior to dental appt      . diclofenac sodium (VOLTAREN) 1 % GEL Apply 1 application topically as needed. Joint pain ankle      . Diclofenac-Misoprostol (ARTHROTEC PO) Take 1 tablet by mouth 3 (three) times daily.      . diphenhydrAMINE (BENADRYL) 25 MG tablet Take 25 mg by mouth at bedtime as needed. When not taking xanax.      . esomeprazole (NEXIUM) 40 MG capsule Take 40 mg by mouth 2 (two) times daily.      Marland Kitchen estradiol (ESTRACE) 0.1 MG/GM vaginal cream Place 2 g vaginally once a week.      . frovatriptan (FROVA) 2.5 MG tablet Take 2.5 mg by mouth as needed. If recurs, may repeat after 2 hours. Max of 3 tabs in 24 hours.      Marland Kitchen lisinopril (PRINIVIL,ZESTRIL) 5 MG tablet Take 5 mg by mouth daily.      . metFORMIN  (GLUMETZA) 500 MG (MOD) 24 hr tablet Take 500 mg by mouth daily with breakfast.      . PARoxetine (PAXIL) 40 MG tablet Take 40 mg by mouth daily.       Current Facility-Administered Medications  Medication Dose Route Frequency Provider Last Rate Last Dose  . dextrose 5 % solution   Intravenous Continuous Alisha Dare, MD,FACG        Review of Systems Review of Systems  Constitutional: Negative for fever, chills and unexpected weight change.  HENT: Negative for hearing loss, congestion, sore throat, trouble swallowing and voice change.   Eyes: Negative for visual disturbance.  Respiratory: Negative for cough and wheezing.   Cardiovascular: Negative for chest pain, palpitations and leg swelling.  Gastrointestinal: Negative for nausea, vomiting, abdominal pain, diarrhea, constipation, blood in stool, abdominal distention and anal bleeding.  Genitourinary: Negative for hematuria, vaginal bleeding and difficulty urinating.  Musculoskeletal: Positive for arthralgias.  Skin: Negative for rash and wound.  Neurological: Negative for seizures, syncope and headaches.  Hematological: Negative for adenopathy. Does not bruise/bleed easily.  Psychiatric/Behavioral: Negative for confusion.    Blood pressure 130/88, pulse 67, temperature 98.2 F (36.8 C), temperature source Temporal, height 5\' 4"  (1.626 m), weight 153 lb 3.2 oz (69.491 kg), SpO2 96.00%.  Physical Exam Physical Exam  Vitals reviewed. Constitutional: She appears well-developed and well-nourished.  Neck: Neck supple.  Cardiovascular: Normal rate, regular rhythm and normal heart sounds.   Pulmonary/Chest: Effort normal and breath sounds normal. She has no wheezes. She has no rales. Right breast exhibits no inverted nipple, no mass, no nipple discharge, no skin change and no tenderness. Left breast exhibits no inverted nipple, no mass, no nipple discharge, no skin change and no tenderness. Breasts are symmetrical.  Lymphadenopathy:     She has no cervical adenopathy.    She has no axillary adenopathy.       Right: No supraclavicular adenopathy present.       Left: No supraclavicular adenopathy present.    Data Reviewed Mmg/us/mri reviewed *RADIOLOGY REPORT*  Clinical Data: new diagnosis of right breast dcis and invasive  ductal carcinoma related to calcifications in the 1 o'clock  position  BILATERAL BREAST MRI WITH AND WITHOUT CONTRAST  Technique: Multiplanar, multisequence MR images of both breasts  were obtained prior to and following the intravenous administration  of 13ml of Multihance. Three dimensional images were evaluated at  the independent  Dyna[employer] workstation.  Comparison: 05/09/12 mammograms performed at [facility]  Findings: There is minimal background parenchymal enhancement.  On the right, there is a biopsy marker clip in the 1 o'clock  position at the middle third depth related to recent stereotactic  biopsy. This is present along the anterior and superior margin of  an area of ill-defined enhancement showing moderate initial  enhancement and persistent delayed phase kinetics. This area of  enhancement measures 25 x 7 x 15mm and would appear to correspond  to the distribution of the calcifications that were biopsied. The  biopsy tract extends to the skin surface in the 2 o'clock position,  where is there is focal skin edema, thickening, and enhancement  over an area measuring 2.5cm in length. This likely represent post-  biopsy change.  On the left, there is clumped linear enhancement in the 4 o'clock  position at the middle third depth. This measures 10 x 4 x 4mm,  and demonstrates moderate initial and persistent delayed kinetics,  similar to the biopsy-proven carcinoma in the contralateral breast.  There are bilaterally symmetric axillary lymph nodes within normal  range for size and appearance.  There is a 13mm T2-hyperintense lesion in the right lobe of the  liver. It does not appear to enhance,  although the enhancement  properties of the lesion are not well characterized on this  examination.  IMPRESSION:  1. Abnormal enhancement upper inner quadrant right breast  corresponding to biopsy-proven carcinoma.  2. Focus of suspicious enhancement 4 o'clock position left breast.  3. Liver lesion, not completely characterized by this study. It  may represent a cyst, alhtough solid and potentially neoplastic  mass is not excluded.  BI-RADS CATEGORY 4: Suspicious abnormality - biopsy should be  considered.  RECOMMENDATION:  1. Recommend MRI-guided biopsy of the suspicious left breast  enhancement.  2. Recommend further imaging evaluation of right liver lesion.  Consider beginning with ultrasound to determine if the lesion may  represent a cyst.  3. Proceed with treatment planning regarding right breast  carcinoma.   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  Will await results of above prior to proceeding with surgery.  I think she is candidate for surgery first.  her2neu is still pending right now also. I will see back next week after left breast biopsy is completed. We discussed diagnosis and treatment options for almost an hour We discussed the staging and pathophysiology of breast cancer. We discussed all of the different options for treatment for breast cancer including surgery, chemotherapy, radiation therapy, Herceptin, and antiestrogen therapy.   We discussed a sentinel lymph node biopsy as she does not appear to having lymph node involvement right now. We discussed the performance of that with injection of radioactive tracer and blue dye. We discussed that she would have an incision underneath her axillary hairline. We discussed that there is a bout a 10-20% chance of having a positive node with a sentinel lymph  node biopsy and we will await the permanent pathology to make any other first further decisions in terms of her treatment. One of these options might be to return to the operating room to perform an axillary lymph node dissection. We discussed about a 1-2% risk lifetime of chronic shoulder pain as well as lymphedema associated with a sentinel  lymph node biopsy.  We discussed the options for treatment of the breast cancer which included lumpectomy versus a mastectomy. We discussed the performance of the lumpectomy with a wire placement. We discussed a 10-20% chance of a positive margin requiring reexcision in the operating room. We also discussed that she may need radiation therapy or antiestrogen therapy or both if she undergoes lumpectomy. We discussed the mastectomy and the postoperative care for that as well. We discussed that there is no difference in her survival whether she undergoes lumpectomy with radiation therapy or antiestrogen therapy versus a mastectomy. There is a slight difference in the local recurrence rate being 3-5% with lumpectomy and about 1% with a mastectomy. We discussed the risks of operation including bleeding, infection, possible reoperation. She understands her further therapy will be based on what her stages at the time of her operation.         Laquenta Whitsell 05/23/2012, 9:50 PM

## 2012-05-23 NOTE — Telephone Encounter (Signed)
Spoke with Olegario Messier and informed her that I needed to order a MRI guided Left breast bx on this patient to be done this week per Dr. Dwain Sarna.  She said she would work on getting her in for Friday and that she would call me and the patient to set this up.

## 2012-05-23 NOTE — Telephone Encounter (Signed)
Patient called office back, patient reports call was disconnected earlier when speaking with Alisha Matthews.  Patient given appointment information for Abd [place] 05/24/12 @ 8:00, MR Biopsy wire localization on 12/6 @ 8:15, Follow up appointment on 12/9 @ 8:45 w/Dr. Dwain Sarna.  Patient given address and phone number to both  [facility] locations for her procedures.

## 2012-05-24 ENCOUNTER — Ambulatory Visit
Admission: RE | Admit: 2012-05-24 | Discharge: 2012-05-24 | Disposition: A | Discharge status: Home / Self Care | Payer: 59 | Source: Ambulatory Visit | Attending: General Surgery | Admitting: General Surgery

## 2012-05-24 DIAGNOSIS — C50219 Malignant neoplasm of upper-inner quadrant of unspecified female breast: Secondary | ICD-10-CM

## 2012-05-24 LAB — WOUND CULTURE
Gram Stain: NONE SEEN
Gram Stain: NONE SEEN

## 2012-05-24 MED ORDER — SULFAMETHOXAZOLE-TRIMETHOPRIM 800-160 MG PO TABS: 1.0000 | ORAL_TABLET | Freq: Two times a day (BID) | ORAL | Status: DC

## 2012-05-24 NOTE — Addendum Note (Signed)
Addended by: Morrell Riddle on: 05/24/2012 02:37 PM   Modules accepted: Orders

## 2012-05-25 ENCOUNTER — Telehealth (INDEPENDENT_AMBULATORY_CARE_PROVIDER_SITE_OTHER): Payer: Self-pay | Admitting: General Surgery

## 2012-05-25 NOTE — Telephone Encounter (Signed)
Message copied by Littie Deeds on Wed May 25, 2012  9:32 AM ------      Message from: [place], [place]      Created: Tue May 24, 2012  8:38 AM       This is a liver cyst.      ----- Message -----         From: Rad Results In Interface         Sent: 05/24/2012   8:34 AM           To: Emelia Loron, MD

## 2012-05-25 NOTE — Telephone Encounter (Signed)
Spoke with pt and informed ehr that her [place] only showed a liver cyst.  She was pleased with this.

## 2012-05-27 ENCOUNTER — Ambulatory Visit
Admission: RE | Admit: 2012-05-27 | Discharge: 2012-05-27 | Disposition: A | Discharge status: Home / Self Care | Payer: 59 | Source: Ambulatory Visit | Attending: General Surgery | Admitting: General Surgery

## 2012-05-27 ENCOUNTER — Other Ambulatory Visit: Payer: 59

## 2012-05-27 DIAGNOSIS — C50219 Malignant neoplasm of upper-inner quadrant of unspecified female breast: Secondary | ICD-10-CM

## 2012-05-27 MED ORDER — GADOBENATE DIMEGLUMINE 529 MG/ML IV SOLN
13.0000 mL | Freq: Once | INTRAVENOUS | Status: AC | PRN
  Administered 2012-05-27: 13 mL via INTRAVENOUS

## 2012-05-30 ENCOUNTER — Ambulatory Visit (INDEPENDENT_AMBULATORY_CARE_PROVIDER_SITE_OTHER): Payer: Commercial Managed Care - PPO | Admitting: General Surgery

## 2012-05-30 ENCOUNTER — Encounter (INDEPENDENT_AMBULATORY_CARE_PROVIDER_SITE_OTHER): Payer: Self-pay | Admitting: General Surgery

## 2012-05-30 VITALS — BP 132/80 | HR 72 | Resp 16 | Ht 64.0 in | Wt 156.0 lb

## 2012-05-30 DIAGNOSIS — C50219 Malignant neoplasm of upper-inner quadrant of unspecified female breast: Secondary | ICD-10-CM

## 2012-05-30 NOTE — Progress Notes (Signed)
Subjective:     Patient ID: Alisha Matthews, female   DOB: 10/22/1950, 61 y.o.   MRN: [id number]  HPI This is a 61-year-old female I saw last week after a newly diagnosed right breast cancer. She reports no changes today. She comes back in followup for several things. On her MR she had an area on her liver that was recommended for further evaluation. This ends up being a complex cyst of her liver requiring no further evaluation. Additionally her HER-2/neu is not amplified. She also had a suspicious left breast enhancement. This has undergone MR biopsy and shows this to be a fibroadenoma with no atypia or malignancy. This is concordant and does not need any further evaluation. We discussed previously a lumpectomy as well as sentinel node biopsy after discussing all the options and that is what she would like to proceed with today. Additionally we did talk a little bit oabout diet which she states she is become mostly a vegetarian and is now has a heavily soy-based diet. I did recommend her we'll talk again a later date do not plan on for the long-term having a heavy soy-based diet.  Review of Systems     Objective:   Physical Exam deferred    Assessment:     Right breast cancer, clinical stage II    Plan:     Right breast lumpectomy, snbx as discussed last week.       

## 2012-06-01 ENCOUNTER — Telehealth: Payer: Self-pay

## 2012-06-01 ENCOUNTER — Encounter (INDEPENDENT_AMBULATORY_CARE_PROVIDER_SITE_OTHER): Payer: Self-pay | Admitting: General Surgery

## 2012-06-01 NOTE — Telephone Encounter (Signed)
Spoke with Erskine Squibb. Left note on her locker for her to get tomorrow

## 2012-06-01 NOTE — Telephone Encounter (Signed)
Please write work note excusing pt from work 11/29, 11/30, 12/1, 12/2.  May return to work 12/3.  KMS

## 2012-06-01 NOTE — Telephone Encounter (Signed)
Babita called and stated that she needs Dr Katrinka Blazing to write her a letter, if possible, for FMLA that states she was supposed to be excused from work on 11/29, 11/30, 12/1, and 05/23/12, AND that it is OK for her to RTW on 05/24/12. We can call her on cell # at [phone] when ready. Dr Katrinka Blazing, pt is supposed to have this letter today, and she is also contacting her surgeon's office because she knows you are not scheduled to be in the office today. Please let me know if I can help, I'll be glad to write the letter with your OK, or can print off and contact pt if you write it.

## 2012-06-03 ENCOUNTER — Encounter (HOSPITAL_BASED_OUTPATIENT_CLINIC_OR_DEPARTMENT_OTHER): Payer: Self-pay | Admitting: *Deleted

## 2012-06-03 ENCOUNTER — Encounter (HOSPITAL_BASED_OUTPATIENT_CLINIC_OR_DEPARTMENT_OTHER)
Admission: RE | Admit: 2012-06-03 | Discharge: 2012-06-03 | Disposition: A | Discharge status: Home / Self Care | Payer: 59 | Source: Ambulatory Visit | Attending: General Surgery | Admitting: General Surgery

## 2012-06-03 NOTE — Progress Notes (Signed)
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

## 2012-06-07 ENCOUNTER — Other Ambulatory Visit (INDEPENDENT_AMBULATORY_CARE_PROVIDER_SITE_OTHER): Payer: Self-pay | Admitting: General Surgery

## 2012-06-07 ENCOUNTER — Telehealth (INDEPENDENT_AMBULATORY_CARE_PROVIDER_SITE_OTHER): Payer: Self-pay | Admitting: General Surgery

## 2012-06-07 ENCOUNTER — Telehealth: Payer: Self-pay | Admitting: *Deleted

## 2012-06-07 DIAGNOSIS — C50219 Malignant neoplasm of upper-inner quadrant of unspecified female breast: Secondary | ICD-10-CM

## 2012-06-07 NOTE — Telephone Encounter (Signed)
Left message for pt to return my call so I can schedule a Med Onc appt. 

## 2012-06-07 NOTE — Telephone Encounter (Signed)
LMOM asking pt to return my call.  This is so that I may inform her we have put in a ref to med onc and rad onc and that they should be contacting her soon to set those appts up for her.

## 2012-06-08 ENCOUNTER — Ambulatory Visit (HOSPITAL_BASED_OUTPATIENT_CLINIC_OR_DEPARTMENT_OTHER): Payer: 59 | Admitting: *Deleted

## 2012-06-08 ENCOUNTER — Encounter (HOSPITAL_BASED_OUTPATIENT_CLINIC_OR_DEPARTMENT_OTHER): Payer: Self-pay | Admitting: *Deleted

## 2012-06-08 ENCOUNTER — Encounter (HOSPITAL_COMMUNITY)
Admission: RE | Admit: 2012-06-08 | Discharge: 2012-06-08 | Disposition: A | Discharge status: Home / Self Care | Payer: 59 | Source: Ambulatory Visit | Attending: General Surgery | Admitting: General Surgery

## 2012-06-08 ENCOUNTER — Encounter (HOSPITAL_BASED_OUTPATIENT_CLINIC_OR_DEPARTMENT_OTHER)
Admission: RE | Disposition: A | Discharge status: Home / Self Care | Payer: Self-pay | Source: Ambulatory Visit | Attending: General Surgery

## 2012-06-08 ENCOUNTER — Ambulatory Visit (HOSPITAL_BASED_OUTPATIENT_CLINIC_OR_DEPARTMENT_OTHER)
Admission: RE | Admit: 2012-06-08 | Discharge: 2012-06-08 | Disposition: A | Discharge status: Home / Self Care | Payer: 59 | Source: Ambulatory Visit | Attending: General Surgery | Admitting: General Surgery

## 2012-06-08 DIAGNOSIS — E119 Type 2 diabetes mellitus without complications: Secondary | ICD-10-CM | POA: Insufficient documentation

## 2012-06-08 DIAGNOSIS — C50219 Malignant neoplasm of upper-inner quadrant of unspecified female breast: Secondary | ICD-10-CM | POA: Insufficient documentation

## 2012-06-08 DIAGNOSIS — Z01810 Encounter for preprocedural cardiovascular examination: Secondary | ICD-10-CM | POA: Insufficient documentation

## 2012-06-08 DIAGNOSIS — D0590 Unspecified type of carcinoma in situ of unspecified breast: Secondary | ICD-10-CM

## 2012-06-08 DIAGNOSIS — Z01812 Encounter for preprocedural laboratory examination: Secondary | ICD-10-CM | POA: Insufficient documentation

## 2012-06-08 DIAGNOSIS — I10 Essential (primary) hypertension: Secondary | ICD-10-CM | POA: Insufficient documentation

## 2012-06-08 HISTORY — PX: BREAST LUMPECTOMY WITH NEEDLE LOCALIZATION AND AXILLARY SENTINEL LYMPH NODE BX: SHX5760

## 2012-06-08 LAB — GLUCOSE, CAPILLARY: Glucose-Capillary: 87 mg/dL (ref 70–99)

## 2012-06-08 SURGERY — BREAST LUMPECTOMY WITH NEEDLE LOCALIZATION AND AXILLARY SENTINEL LYMPH NODE BX
Anesthesia: General | Site: Breast | Laterality: Right | Wound class: Clean

## 2012-06-08 MED ORDER — LACTATED RINGERS IV SOLN
INTRAVENOUS | Status: DC
Start: 2012-06-08 — End: 2012-06-08
  Administered 2012-06-08: 14:00:00 via INTRAVENOUS
  Administered 2012-06-08: 10 mL/h via INTRAVENOUS
  Administered 2012-06-08: 13:00:00 via INTRAVENOUS

## 2012-06-08 MED ORDER — PROPOFOL 10 MG/ML IV BOLUS
INTRAVENOUS | Status: DC | PRN
  Administered 2012-06-08: 200 mg via INTRAVENOUS

## 2012-06-08 MED ORDER — ACETAMINOPHEN 10 MG/ML IV SOLN
INTRAVENOUS | Status: DC | PRN
  Administered 2012-06-08: 1000 mg via INTRAVENOUS

## 2012-06-08 MED ORDER — ONDANSETRON HCL 4 MG/2ML IJ SOLN
INTRAMUSCULAR | Status: DC | PRN
  Administered 2012-06-08: 4 mg via INTRAVENOUS

## 2012-06-08 MED ORDER — OXYCODONE-ACETAMINOPHEN 5-325 MG PO TABS: 1.0000 | ORAL_TABLET | ORAL | Status: DC | PRN

## 2012-06-08 MED ORDER — ACETAMINOPHEN 10 MG/ML IV SOLN
1000.0000 mg | Freq: Once | INTRAVENOUS | Status: AC
  Administered 2012-06-08: 1000 mg via INTRAVENOUS

## 2012-06-08 MED ORDER — HYDROMORPHONE HCL PF 1 MG/ML IJ SOLN
0.2500 mg | INTRAMUSCULAR | Status: DC | PRN
  Administered 2012-06-08 (×4): 0.5 mg via INTRAVENOUS

## 2012-06-08 MED ORDER — LIDOCAINE HCL (CARDIAC) 20 MG/ML IV SOLN
INTRAVENOUS | Status: DC | PRN
  Administered 2012-06-08: 60 mg via INTRAVENOUS

## 2012-06-08 MED ORDER — VANCOMYCIN HCL IN DEXTROSE 1-5 GM/200ML-% IV SOLN
1000.0000 mg | INTRAVENOUS | Status: AC
  Administered 2012-06-08: 1000 mg via INTRAVENOUS

## 2012-06-08 MED ORDER — MIDAZOLAM HCL 2 MG/2ML IJ SOLN
0.5000 mg | INTRAMUSCULAR | Status: DC | PRN
  Administered 2012-06-08: 2 mg via INTRAVENOUS

## 2012-06-08 MED ORDER — OXYCODONE HCL 5 MG PO TABS
5.0000 mg | ORAL_TABLET | Freq: Once | ORAL | Status: AC | PRN
  Administered 2012-06-08: 5 mg via ORAL

## 2012-06-08 MED ORDER — DEXAMETHASONE SODIUM PHOSPHATE 4 MG/ML IJ SOLN
INTRAMUSCULAR | Status: DC | PRN
  Administered 2012-06-08: 10 mg via INTRAVENOUS

## 2012-06-08 MED ORDER — BUPIVACAINE HCL (PF) 0.25 % IJ SOLN
INTRAMUSCULAR | Status: DC | PRN
  Administered 2012-06-08: 16 mL

## 2012-06-08 MED ORDER — OXYCODONE HCL 5 MG/5ML PO SOLN: 5.0000 mg | Freq: Once | ORAL | Status: AC | PRN

## 2012-06-08 MED ORDER — FENTANYL CITRATE 0.05 MG/ML IJ SOLN
INTRAMUSCULAR | Status: DC | PRN
  Administered 2012-06-08: 25 ug via INTRAVENOUS
  Administered 2012-06-08: 100 ug via INTRAVENOUS

## 2012-06-08 MED ORDER — EPHEDRINE SULFATE 50 MG/ML IJ SOLN
INTRAMUSCULAR | Status: DC | PRN
  Administered 2012-06-08: 5 mg via INTRAVENOUS
  Administered 2012-06-08: 15 mg via INTRAVENOUS

## 2012-06-08 MED ORDER — TECHNETIUM TC 99M SULFUR COLLOID FILTERED
1.0000 | Freq: Once | INTRAVENOUS | Status: AC | PRN
  Administered 2012-06-08: 1 via INTRADERMAL

## 2012-06-08 MED ORDER — FENTANYL CITRATE 0.05 MG/ML IJ SOLN
50.0000 ug | INTRAMUSCULAR | Status: DC | PRN
  Administered 2012-06-08: 100 ug via INTRAVENOUS

## 2012-06-08 SURGICAL SUPPLY — 67 items
ADH SKN CLS APL DERMABOND .7 (GAUZE/BANDAGES/DRESSINGS) ×1
APL SKNCLS STERI-STRIP NONHPOA (GAUZE/BANDAGES/DRESSINGS)
APPLIER CLIP 9.375 MED OPEN (MISCELLANEOUS) ×2
APR CLP MED 9.3 20 MLT OPN (MISCELLANEOUS) ×1
BENZOIN TINCTURE PRP APPL 2/3 (GAUZE/BANDAGES/DRESSINGS) ×1 IMPLANT
BINDER BREAST LRG (GAUZE/BANDAGES/DRESSINGS) ×1 IMPLANT
BINDER BREAST MEDIUM (GAUZE/BANDAGES/DRESSINGS) IMPLANT
BINDER BREAST XLRG (GAUZE/BANDAGES/DRESSINGS) IMPLANT
BINDER BREAST XXLRG (GAUZE/BANDAGES/DRESSINGS) IMPLANT
BLADE SURG 15 STRL LF DISP TIS (BLADE) ×1 IMPLANT
BLADE SURG 15 STRL SS (BLADE) ×2
BNDG COHESIVE 4X5 TAN STRL (GAUZE/BANDAGES/DRESSINGS) IMPLANT
CANISTER SUCTION 1200CC (MISCELLANEOUS) ×2 IMPLANT
CHLORAPREP W/TINT 26ML (MISCELLANEOUS) ×2 IMPLANT
CLIP APPLIE 9.375 MED OPEN (MISCELLANEOUS) ×1 IMPLANT
CLOTH BEACON ORANGE TIMEOUT ST (SAFETY) ×2 IMPLANT
COVER MAYO STAND STRL (DRAPES) ×2 IMPLANT
COVER PROBE W GEL 5X96 (DRAPES) ×2 IMPLANT
COVER TABLE BACK 60X90 (DRAPES) ×2 IMPLANT
DECANTER SPIKE VIAL GLASS SM (MISCELLANEOUS) IMPLANT
DERMABOND ADVANCED (GAUZE/BANDAGES/DRESSINGS) ×1
DERMABOND ADVANCED .7 DNX12 (GAUZE/BANDAGES/DRESSINGS) IMPLANT
DEVICE DUBIN W/COMP PLATE 8390 (MISCELLANEOUS) IMPLANT
DRAIN CHANNEL 19F RND (DRAIN) IMPLANT
DRAPE LAPAROSCOPIC ABDOMINAL (DRAPES) ×1 IMPLANT
DRAPE U-SHAPE 76X120 STRL (DRAPES) IMPLANT
DRSG TEGADERM 4X4.75 (GAUZE/BANDAGES/DRESSINGS) ×4 IMPLANT
ELECT COATED BLADE 2.86 ST (ELECTRODE) ×2 IMPLANT
ELECT REM PT RETURN 9FT ADLT (ELECTROSURGICAL) ×2
ELECTRODE REM PT RTRN 9FT ADLT (ELECTROSURGICAL) ×1 IMPLANT
EVACUATOR SILICONE 100CC (DRAIN) IMPLANT
GAUZE SPONGE 4X4 12PLY STRL LF (GAUZE/BANDAGES/DRESSINGS) ×1 IMPLANT
GLOVE BIO SURGEON STRL SZ7 (GLOVE) ×4 IMPLANT
GLOVE BIOGEL PI IND STRL 7.5 (GLOVE) ×1 IMPLANT
GLOVE BIOGEL PI INDICATOR 7.5 (GLOVE) ×1
GLOVE INDICATOR 7.0 STRL GRN (GLOVE) ×1 IMPLANT
GOWN PREVENTION PLUS XLARGE (GOWN DISPOSABLE) ×3 IMPLANT
KIT MARKER MARGIN INK (KITS) ×2 IMPLANT
NDL HYPO 25X1 1.5 SAFETY (NEEDLE) ×2 IMPLANT
NDL SAFETY ECLIPSE 18X1.5 (NEEDLE) ×1 IMPLANT
NEEDLE HYPO 18GX1.5 SHARP (NEEDLE)
NEEDLE HYPO 25X1 1.5 SAFETY (NEEDLE) IMPLANT
NS IRRIG 1000ML POUR BTL (IV SOLUTION) IMPLANT
PACK BASIN DAY SURGERY FS (CUSTOM PROCEDURE TRAY) ×2 IMPLANT
PENCIL BUTTON HOLSTER BLD 10FT (ELECTRODE) ×2 IMPLANT
PIN SAFETY STERILE (MISCELLANEOUS) IMPLANT
SLEEVE SCD COMPRESS KNEE MED (MISCELLANEOUS) ×2 IMPLANT
SPONGE LAP 18X18 X RAY DECT (DISPOSABLE) IMPLANT
SPONGE LAP 4X18 X RAY DECT (DISPOSABLE) ×2 IMPLANT
STAPLER VISISTAT 35W (STAPLE) ×2 IMPLANT
STOCKINETTE IMPERVIOUS LG (DRAPES) IMPLANT
STRIP CLOSURE SKIN 1/2X4 (GAUZE/BANDAGES/DRESSINGS) ×2 IMPLANT
SUT MNCRL AB 4-0 PS2 18 (SUTURE) ×3 IMPLANT
SUT MON AB 5-0 PS2 18 (SUTURE) IMPLANT
SUT SILK 2 0 SH (SUTURE) ×1 IMPLANT
SUT VIC AB 2-0 SH 27 (SUTURE) ×4
SUT VIC AB 2-0 SH 27XBRD (SUTURE) ×1 IMPLANT
SUT VIC AB 3-0 SH 27 (SUTURE) ×4
SUT VIC AB 3-0 SH 27X BRD (SUTURE) ×1 IMPLANT
SUT VIC AB 5-0 PS2 18 (SUTURE) IMPLANT
SUT VICRYL AB 3 0 TIES (SUTURE) IMPLANT
SYR CONTROL 10ML LL (SYRINGE) ×4 IMPLANT
TOWEL OR 17X24 6PK STRL BLUE (TOWEL DISPOSABLE) ×2 IMPLANT
TOWEL OR NON WOVEN STRL DISP B (DISPOSABLE) ×2 IMPLANT
TUBE CONNECTING 20X1/4 (TUBING) ×2 IMPLANT
WATER STERILE IRR 1000ML POUR (IV SOLUTION) ×1 IMPLANT
YANKAUER SUCT BULB TIP NO VENT (SUCTIONS) ×2 IMPLANT

## 2012-06-08 NOTE — Anesthesia Preprocedure Evaluation (Signed)
Anesthesia Evaluation  Patient identified by MRN, date of birth, ID band Patient awake    Reviewed: Allergy & Precautions, H&P , NPO status , Patient's Chart, lab work & pertinent test results  Airway Mallampati: II TM Distance: >3 FB Neck ROM: Full    Dental No notable dental hx. (+) Teeth Intact and Dental Advisory Given   Pulmonary asthma ,  breath sounds clear to auscultation  Pulmonary exam normal       Cardiovascular hypertension, On Medications Rhythm:Regular Rate:Normal     Neuro/Psych PSYCHIATRIC DISORDERS negative neurological ROS     GI/Hepatic Neg liver ROS, GERD-  Medicated and Controlled,  Endo/Other  diabetes, Well Controlled, Type 2, Oral Hypoglycemic Agents  Renal/GU negative Renal ROS  negative genitourinary   Musculoskeletal   Abdominal   Peds  Hematology negative hematology ROS (+)   Anesthesia Other Findings   Reproductive/Obstetrics negative OB ROS                           Anesthesia Physical Anesthesia Plan  ASA: II  Anesthesia Plan: General   Post-op Pain Management:    Induction: Intravenous  Airway Management Planned: LMA  Additional Equipment:   Intra-op Plan:   Post-operative Plan: Extubation in OR  Informed Consent: I have reviewed the patients History and Physical, chart, labs and discussed the procedure including the risks, benefits and alternatives for the proposed anesthesia with the patient or authorized representative who has indicated his/her understanding and acceptance.   Dental advisory given  Plan Discussed with: CRNA  Anesthesia Plan Comments:         Anesthesia Quick Evaluation

## 2012-06-08 NOTE — H&P (View-Only) (Signed)
Subjective:     Patient ID: Alisha Matthews, female   DOB: 03/24/1951, 61 y.o.   MRN: [id number]  HPI This is a 61-year-old female I saw last week after a newly diagnosed right breast cancer. She reports no changes today. She comes back in followup for several things. On her MR she had an area on her liver that was recommended for further evaluation. This ends up being a complex cyst of her liver requiring no further evaluation. Additionally her HER-2/neu is not amplified. She also had a suspicious left breast enhancement. This has undergone MR biopsy and shows this to be a fibroadenoma with no atypia or malignancy. This is concordant and does not need any further evaluation. We discussed previously a lumpectomy as well as sentinel node biopsy after discussing all the options and that is what she would like to proceed with today. Additionally we did talk a little bit oabout diet which she states she is become mostly a vegetarian and is now has a heavily soy-based diet. I did recommend her we'll talk again a later date do not plan on for the long-term having a heavy soy-based diet.  Review of Systems     Objective:   Physical Exam deferred    Assessment:     Right breast cancer, clinical stage II    Plan:     Right breast lumpectomy, snbx as discussed last week.       

## 2012-06-08 NOTE — Interval H&P Note (Signed)
History and Physical Interval Note:  06/08/2012 1:05 PM  Alisha Matthews  has presented today for surgery, with the diagnosis of breast cancer  The various methods of treatment have been discussed with the patient and family. After consideration of risks, benefits and other options for treatment, the patient has consented to  Procedure(s) (LRB) with comments: BREAST LUMPECTOMY WITH NEEDLE LOCALIZATION AND AXILLARY SENTINEL LYMPH NODE BX (Right) as a surgical intervention .  The patient's history has been reviewed, patient examined, no change in status, stable for surgery.  I have reviewed the patient's chart and labs.  Questions were answered to the patient's satisfaction.     [name]

## 2012-06-08 NOTE — Op Note (Signed)
Preoperative diagnosis: Clinical stage II right breast cancer Postoperative diagnosis: Same as above Procedure: #1 right breast wire-guided lumpectomy #2 right axillary sentinel lymph node biopsy Surgeon: Dr. Harden Mo Anesthesia: Gen. Specimens: #1 right breast tissue marked with paint #2 posterior margin that was unmarked #3 additional superior margin marked short stitch superior, long stitch lateral, and double stitch deep Estimated blood loss: Minimal Complications: None Drains: None Sponge and needle count correct x2 at end of operation Disposition to recovery stable  Indications: This is a 61 year old female who presents after undergoing a screening mammogram showing a right breast mass. This underwent a biopsy which showed invasive ductal carcinoma. She is undergone a thorough evaluation . She has elected to attempt to undergo breast conservation therapy.We discussed a right breast wire-guided lumpectomy as well as a right axillary sentinel lymph node biopsy. She understands the risks of need for further operation for margins or possibly for positive nodes. She also understands decisions for adjuvant therapy will be made on the results of surgery. She understands she will need radiation therapy.  Procedure: After informed consent was obtained the patient first went to the breast center where she had a wire placed. I discussed this with Dr. Tilda Burrow. This was an 8 cm wire basically. She was then brought to day surgery.She was injected with technetium in the standard periareolar fashion. She had ciprofloxacin administered. Sequential compression devices were on her legs. She was placed under general anesthesia without complication. Her right breast and axilla were then prepped and draped in the standard sterile surgical fashion.  I identified where the wire appeared to end. I made a curvilinear incision overlying the lesion in the superior portion of her breast. I then used cautery to  excise the wire as well as the tissue surrounding it. I brought the wire in from its entry site remotely. I then took this all the way down to the pectoralis muscle. There is a small unoriented piece of tissue that is the posterior margin and  I did this because I lost the orientation. The remainder of the tissue was then placed in the Faxitron mammogram and confirmed removal of the clip, wire and the mass. The wire was a decent amount away from the clip in the mass. This is not what appeared to be on the mammograms. I did excise an additional superior margin as it looked like this was very close on the specimen mammogram after I evaluated it. I marked this as above. I then obtained hemostasis. I placed 2 clips on the muscle. I placed one clip in each position around the cavity. I then mobilized the breast tissue and close this with 2-0 Vicryl suture. I closed the dermis with 3-0 Vicryl and the skin with 4-0 Monocryl.  I then identified the location of the sentinel node. I made a 2 cm incision below the axillary hairline. I carried this through the axillary fascia. I then identified 2 sentinel lymph nodes that were hot. The counts were 1452 and 1100. These were both removed at the same time. The background radioactivity was less than 20. There were no other nodes that I can identify. Hemostasis was observed. I then closed the axillary fascia with 2-0 Vicryl. I closed the dermis with 3-0 Vicryl and the skin with 4 Monocryl. I infiltrated both incisions with Marcaine. I then placed steristrips and sterile dressings. A breast binder was placed. She tolerated this well was extubated and transferred to the recovery room in stable condition.

## 2012-06-08 NOTE — Progress Notes (Signed)
Nuc med injection done by radiology staff. Versed and fentanyl given for sedation. Pt tol well. Husband, Onalee Hua, came in earlier and will remain in lobby until surgery is done.

## 2012-06-08 NOTE — Transfer of Care (Signed)
Immediate Anesthesia Transfer of Care Note  Patient: Alisha Matthews  Procedure(s) Performed: Procedure(s) (LRB) with comments: BREAST LUMPECTOMY WITH NEEDLE LOCALIZATION AND AXILLARY SENTINEL LYMPH NODE BX (Right)  Patient Location: PACU  Anesthesia Type:General  Level of Consciousness: awake, alert  and oriented  Airway & Oxygen Therapy: Patient Spontanous Breathing and Patient connected to face mask oxygen  Post-op Assessment: Report given to PACU RN, Post -op Vital signs reviewed and stable and Patient moving all extremities  Post vital signs: Reviewed and stable  Complications: No apparent anesthesia complications

## 2012-06-08 NOTE — Anesthesia Postprocedure Evaluation (Signed)
  Anesthesia Post-op Note  Patient: Alisha Matthews  Procedure(s) Performed: Procedure(s) (LRB) with comments: BREAST LUMPECTOMY WITH NEEDLE LOCALIZATION AND AXILLARY SENTINEL LYMPH NODE BX (Right)  Patient Location: PACU  Anesthesia Type:General  Level of Consciousness: awake, alert  and oriented  Airway and Oxygen Therapy: Patient Spontanous Breathing and Patient connected to face mask oxygen  Post-op Pain: mild  Post-op Assessment: Post-op Vital signs reviewed, Patient's Cardiovascular Status Stable, Respiratory Function Stable, Patent Airway and No signs of Nausea or vomiting  Post-op Vital Signs: Reviewed and stable  Complications: No apparent anesthesia complications

## 2012-06-08 NOTE — Anesthesia Procedure Notes (Signed)
Procedure Name: LMA Insertion Date/Time: 06/08/2012 1:51 PM Performed by: Meyer Russel Pre-anesthesia Checklist: Patient identified, Emergency Drugs available, Suction available and Patient being monitored Patient Re-evaluated:Patient Re-evaluated prior to inductionOxygen Delivery Method: Circle System Utilized Preoxygenation: Pre-oxygenation with 100% oxygen Intubation Type: IV induction Ventilation: Mask ventilation without difficulty LMA: LMA inserted LMA Size: 4.0 Number of attempts: 1 Airway Equipment and Method: bite block Placement Confirmation: positive ETCO2 and breath sounds checked- equal and bilateral Tube secured with: Tape Dental Injury: Teeth and Oropharynx as per pre-operative assessment

## 2012-06-09 ENCOUNTER — Telehealth: Payer: Self-pay | Admitting: *Deleted

## 2012-06-09 ENCOUNTER — Encounter (HOSPITAL_BASED_OUTPATIENT_CLINIC_OR_DEPARTMENT_OTHER): Payer: Self-pay | Admitting: General Surgery

## 2012-06-09 ENCOUNTER — Other Ambulatory Visit: Payer: Self-pay | Admitting: *Deleted

## 2012-06-09 DIAGNOSIS — C50219 Malignant neoplasm of upper-inner quadrant of unspecified female breast: Secondary | ICD-10-CM

## 2012-06-09 LAB — GLUCOSE, CAPILLARY: Glucose-Capillary: 78 mg/dL (ref 70–99)

## 2012-06-09 NOTE — Telephone Encounter (Signed)
Confirmed 06/25/12 appt w/ pt.  Mailed before appt letter & packet to pt.  Emailed Musician at [facility] to make aware.  Emailed Clydie Braun for [employer].  Took paperwork to Med Rec for chart.

## 2012-06-13 ENCOUNTER — Other Ambulatory Visit (INDEPENDENT_AMBULATORY_CARE_PROVIDER_SITE_OTHER): Payer: Self-pay | Admitting: General Surgery

## 2012-06-16 ENCOUNTER — Encounter (INDEPENDENT_AMBULATORY_CARE_PROVIDER_SITE_OTHER): Payer: Self-pay

## 2012-06-17 ENCOUNTER — Telehealth (INDEPENDENT_AMBULATORY_CARE_PROVIDER_SITE_OTHER): Payer: Self-pay | Admitting: General Surgery

## 2012-06-17 ENCOUNTER — Encounter (HOSPITAL_BASED_OUTPATIENT_CLINIC_OR_DEPARTMENT_OTHER): Payer: Self-pay | Admitting: *Deleted

## 2012-06-17 NOTE — Progress Notes (Signed)
To go to cancer center 06/23/12-they are doing labs

## 2012-06-17 NOTE — Telephone Encounter (Signed)
Pt called to ask about Sullenger swelling under the arm.  Husband is a PA and examined it; told her it does not look infected or problematic.  Applied heat to the area last night.  Reassured pt that swelling is not unexpected and will resolve over time.  Heat is ok to use.  Pt asked for refill of pain meds; she understands it will be for Hydrocodone 5/325 mg,  #30, 1-2 Q4-6H prn pain, no refill.  Called in meds to [facility]:  [phone].

## 2012-06-21 ENCOUNTER — Encounter (INDEPENDENT_AMBULATORY_CARE_PROVIDER_SITE_OTHER): Payer: Self-pay | Admitting: General Surgery

## 2012-06-21 ENCOUNTER — Ambulatory Visit (INDEPENDENT_AMBULATORY_CARE_PROVIDER_SITE_OTHER): Payer: Commercial Managed Care - PPO | Admitting: General Surgery

## 2012-06-21 VITALS — BP 142/80 | HR 76 | Temp 98.1°F | Resp 18 | Ht 64.0 in | Wt 154.1 lb

## 2012-06-21 DIAGNOSIS — C50219 Malignant neoplasm of upper-inner quadrant of unspecified female breast: Secondary | ICD-10-CM

## 2012-06-21 NOTE — Progress Notes (Signed)
Subjective:     Patient ID: Alisha Matthews, female   DOB: 1950/11/20, 61 y.o.   MRN: [id number]  HPI The patient is a 25 her white female who is about 2 weeks status post right lumpectomy and sentinel node mapping by Dr. Dwain Sarna. She has developed some swelling of the right arm that is uncomfortable for her. She denies any fevers or chills.  Review of Systems     Objective:   Physical Exam On exam her right breast and axillary incisions are healing nicely with no sign of infection. She does have a moderate sized seroma in the right axilla. Her skin was prepped with ChloraPrep and infiltrated with 1% lidocaine. We were able to draw off approximately 120 cc of serous fluid. She tolerated this well.    Assessment:     2 weeks status post right lumpectomy for breast cancer    Plan:     At this point she has an appointment on Monday to go back to the operating room to re\re excise the margin. If she has further buildup of seroma fluid in the axilla I believe it can be drained at that time.

## 2012-06-21 NOTE — Patient Instructions (Signed)
Plan for surgery monday

## 2012-06-23 ENCOUNTER — Other Ambulatory Visit (HOSPITAL_BASED_OUTPATIENT_CLINIC_OR_DEPARTMENT_OTHER): Payer: 59 | Admitting: Lab

## 2012-06-23 ENCOUNTER — Encounter: Payer: Self-pay | Admitting: Oncology

## 2012-06-23 ENCOUNTER — Ambulatory Visit (HOSPITAL_BASED_OUTPATIENT_CLINIC_OR_DEPARTMENT_OTHER): Payer: 59

## 2012-06-23 ENCOUNTER — Ambulatory Visit (INDEPENDENT_AMBULATORY_CARE_PROVIDER_SITE_OTHER): Payer: Commercial Managed Care - PPO | Admitting: General Surgery

## 2012-06-23 ENCOUNTER — Encounter (INDEPENDENT_AMBULATORY_CARE_PROVIDER_SITE_OTHER): Payer: Self-pay | Admitting: General Surgery

## 2012-06-23 ENCOUNTER — Ambulatory Visit (HOSPITAL_BASED_OUTPATIENT_CLINIC_OR_DEPARTMENT_OTHER): Payer: 59 | Admitting: Oncology

## 2012-06-23 VITALS — BP 133/81 | HR 91 | Temp 98.4°F | Resp 20 | Ht 64.0 in | Wt 155.5 lb

## 2012-06-23 VITALS — BP 142/60 | HR 72 | Temp 97.8°F | Resp 16 | Ht 64.0 in | Wt 156.8 lb

## 2012-06-23 DIAGNOSIS — C50219 Malignant neoplasm of upper-inner quadrant of unspecified female breast: Secondary | ICD-10-CM

## 2012-06-23 DIAGNOSIS — M899 Disorder of bone, unspecified: Secondary | ICD-10-CM

## 2012-06-23 DIAGNOSIS — Z170 Estrogen receptor positive status [ER+]: Secondary | ICD-10-CM

## 2012-06-23 LAB — CBC WITH DIFFERENTIAL/PLATELET
BASO%: 0.6 % (ref 0.0–2.0)
EOS%: 1.1 % (ref 0.0–7.0)
HCT: 34.2 % — ABNORMAL LOW (ref 34.8–46.6)
LYMPH%: 25.9 % (ref 14.0–49.7)
MCH: 30.9 pg (ref 25.1–34.0)
MCHC: 33.8 g/dL (ref 31.5–36.0)
MONO#: 0.4 10*3/uL (ref 0.1–0.9)
NEUT%: 66.6 % (ref 38.4–76.8)
Platelets: 194 10*3/uL (ref 145–400)
RBC: 3.74 10*6/uL (ref 3.70–5.45)
WBC: 6.2 10*3/uL (ref 3.9–10.3)

## 2012-06-23 LAB — COMPREHENSIVE METABOLIC PANEL (CC13)
ALT: 24 U/L (ref 0–55)
AST: 26 U/L (ref 5–34)
Alkaline Phosphatase: 68 U/L (ref 40–150)
Creatinine: 0.9 mg/dL (ref 0.6–1.1)
Sodium: 137 mEq/L (ref 136–145)
Total Bilirubin: 0.44 mg/dL (ref 0.20–1.20)
Total Protein: 6.6 g/dL (ref 6.4–8.3)

## 2012-06-23 NOTE — Patient Instructions (Signed)
Pampering for 1-2 weeks!!!  Discuss seroma with Dr. Dwain Sarna.

## 2012-06-23 NOTE — Assessment & Plan Note (Signed)
120 mL seroma aspirated.    Clear yellow fluid.  Has surgery Monday for margin reexcision with Dr. Dwain Sarna.  He can make determination for drain.

## 2012-06-23 NOTE — Progress Notes (Signed)
Checked in new patient. No financial issues. °

## 2012-06-23 NOTE — Progress Notes (Signed)
HISTORY: Pt is s/p right BCT.  She has reacumulation of seroma since 12/31.  She is having some pain at the site.  She has been doing significant activity.      EXAM: General:  Well groomed.   Incision:  Well healed.  Large seroma in right axilla   PATHOLOGY: Positive margins, negative nodes.     ASSESSMENT AND PLAN:   Cancer of upper-inner quadrant of female breast 120 mL seroma aspirated.    Clear yellow fluid.  Has surgery Monday for margin reexcision with Dr. Dwain Sarna.  He can make determination for drain.        Maudry Diego, MD Surgical Oncology, General & Endocrine Surgery [facility], Eliezer Bottom, MD Ethelda Chick, MD

## 2012-06-23 NOTE — Progress Notes (Signed)
ID: Alisha Matthews   DOB: 1951-02-11  MR#: [id number]  GEX#:[id number]  PCP: Alisha Simmer, MD GYN:  Alisha Matthews OTHER MD: Alisha Matthews, Alisha Matthews   HISTORY OF PRESENT ILLNESS: The patient had screening mammography at [facility] showing a worrisome area of calcifications.. I do not have a copy of that study or the following diagnostic mammography, but on 05/12/2012 the patient underwent right breast biopsy showing ([id number]) and invasive ductal carcinoma, E-cadherin positive, estrogen receptor 100% and progesterone receptor 22% positive, with an MIB-1 of 6%, and no amplification of HER-2.  On 05/20/2012 the patient underwent bilateral breast MRI, and this showed, on the right side, and 25 mm area of moderate enhancement, and on the left a 10 mm area of clumped linear enhancement. There was no abnormal lymphadenopathy noted. In the right lobe of the liver a 13 mm hyperintense lesion was incidentally noted.  On 05/24/2012 the patient underwent abdominal ultrasonography showing a complex cyst in the right liver lobe measuring 1.7 cm. There was no solid component. The liver was otherwise minimally inhomogeneous. 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  Accordingly on 06/08/2012 the patient underwent left lumpectomy and sentinel lymph node sampling under [facility]. The final pathology ([id number]) showed a grade 2 invasive ductal carcinoma measuring 5 mm, with focally positive/very near margins. Both sentinel lymph nodes were clear. Further surgery for margin clearance is planned prior to definitive radiation treatments. The patient's subsequent history is as detailed below.  INTERVAL HISTORY: Alisha Matthews was seen at the breast clinic 06/23/2012. Since her lumpectomy she has had 2 seroma drainages, most recently earlier the same day.  REVIEW OF SYSTEMS: Aside from the seroma issue she has  done well with the surgery, without significant pain, fever, erythema, or other complications. She has a history of heartburn, anxiety, and glucose intolerance. She tells me she lost 85 pounds over a period of a year to bring down her A1c, which was approaching 7. She exercises regularly unless stairs step. A detailed review of systems today was otherwise entirely negative.  PAST MEDICAL HISTORY: Past Medical History  Diagnosis Date  . Anxiety   . Arthritis   . Diabetes mellitus     borderline  . GERD (gastroesophageal reflux disease)   . Hyperlipidemia   . Insomnia, unspecified   . Leukocytopenia, unspecified   . Barrett's esophagus   . Breast fibrocystic disorder   . Genital warts   . Osteoarthrosis, unspecified whether generalized or localized, ankle and foot     ankle/foot  . Exercise induced bronchospasm   . Personal history of colonic polyps   . Migraine, unspecified, without mention of intractable migraine without mention of status migrainosus   . Symptomatic menopausal or female climacteric states   . Obesity, unspecified   . Essential hypertension, benign   . Depressive disorder, not elsewhere classified   . Anemia, unspecified   . Chicken pox   . Measles   . Mumps   . Cancer     PAST SURGICAL HISTORY: Past Surgical History  Procedure Date  . Cholecystectomy   . Bunionectomy     3 surgeries on both feet  . Nasal septum surgery   . Total hip arthroplasty     left      Perthes disease  . Foot surgery     Left-revision  . Dilation and curettage of uterus 1978  . Breast  lumpectomy with needle localization and axillary sentinel lymph node bx 06/08/2012    Procedure: BREAST LUMPECTOMY WITH NEEDLE LOCALIZATION AND AXILLARY SENTINEL LYMPH NODE BX;  Surgeon: Alisha Loron, MD;  Location: [facility];  Service: General;  Laterality: Right;    FAMILY HISTORY Family History  Problem Relation Age of Onset  . Hypertension Mother   . Diabetes Mother     . Heart failure Father   . Heart failure Mother   . Depression Brother    the patient knows little about her father, other than the fact that he died at the age of 71, causes unknown to her. The patient's mother died from complications of diabetes at the age of 64. The patient has 2 full and one half brother. She has no sisters. There is no history of breast or ovarian cancer in the family.  GYNECOLOGIC HISTORY: Menarche age 65. She is GX P0. Menopause approximately 2001. She never took hormone replacement.  SOCIAL HISTORY: She has worked as a Production assistant, radio and was involved with the [facility] transplant program while I was clear. Her husband Alisha Matthews is a Surveyor, mining in a pain management clinic in [place]. At home with them are upwards of a dozen cats. Also 1 dog.   ADVANCED DIRECTIVES: Alisha Matthews has a living will and her husband is her healthcare power of attorney.  HEALTH MAINTENANCE: History  Substance Use Topics  . Smoking status: Never Smoker   . Smokeless tobacco: Never Used  . Alcohol Use: No     Colonoscopy: 2013, under Alisha Matthews  PAP: November 2012  Bone density: 2004, showing osteopenia  Lipid panel:  Allergies  Allergen Reactions  . Penicillins Rash    Current Outpatient Prescriptions  Medication Sig Dispense Refill  . albuterol (PROVENTIL HFA;VENTOLIN HFA) 108 (90 BASE) MCG/ACT inhaler Inhale 2 puffs into the lungs every 6 (six) hours as needed. Asthmatic attack      . ALPRAZolam (XANAX) 0.5 MG tablet Take 0.5 mg by mouth at bedtime as needed.      . clindamycin (CLEOCIN) 150 MG capsule Take 150 mg by mouth as needed. Takes 4 as needed prior to dental appt      . diclofenac sodium (VOLTAREN) 1 % GEL Apply 1 application topically as needed. Joint pain ankle      . Diclofenac-Misoprostol (ARTHROTEC PO) Take 1 tablet by mouth 3 (three) times daily.      . diphenhydrAMINE (BENADRYL) 25 MG tablet Take 25 mg by mouth at bedtime as needed. When  not taking xanax.      . esomeprazole (NEXIUM) 40 MG capsule Take 40 mg by mouth 2 (two) times daily.      Marland Kitchen estradiol (ESTRACE) 0.1 MG/GM vaginal cream Place 2 g vaginally once a week.      . frovatriptan (FROVA) 2.5 MG tablet Take 2.5 mg by mouth as needed. If recurs, may repeat after 2 hours. Max of 3 tabs in 24 hours.      Marland Kitchen lisinopril (PRINIVIL,ZESTRIL) 5 MG tablet Take 5 mg by mouth daily.      . metFORMIN (GLUMETZA) 500 MG (MOD) 24 hr tablet Take 500 mg by mouth daily with breakfast.      . oxyCODONE-acetaminophen (ROXICET) 5-325 MG per tablet Take 1 tablet by mouth every 4 (four) hours as needed for pain.  30 tablet  0  . PARoxetine (PAXIL) 40 MG tablet Take 40 mg by mouth daily.  OBJECTIVE: Middle-aged white woman in no acute distress Filed Vitals:   06/23/12 1637  BP: 133/81  Pulse: 91  Temp: 98.4 F (36.9 C)  Resp: 20     Body mass index is 26.69 kg/(m^2).    ECOG FS: 1  Sclerae unicteric Oropharynx clear No cervical or supraclavicular adenopathy Lungs no rales or rhonchi Heart regular rate and rhythm Abd benign MSK no focal spinal tenderness, no peripheral edema Neuro: nonfocal Breasts: The right breast is status post recent lumpectomy. The incision is healing very nicely and the cosmetic result is good. The right axilla is status post recent drainage procedure. There is minimal erythema, no tenderness. The left breast is unremarkable.   LAB RESULTS: Lab Results  Component Value Date   WBC 6.2 06/23/2012   NEUTROABS 4.1 06/23/2012   HGB 11.6 06/23/2012   HCT 34.2* 06/23/2012   MCV 91.4 06/23/2012   PLT 194 06/23/2012      Chemistry      Component Value Date/Time   NA 137 06/23/2012 1618   NA 139 05/09/2012 1032   NA 139 11/04/2011   K 3.6 06/23/2012 1618   K 4.0 05/09/2012 1032   CL 102 06/23/2012 1618   CL 102 05/09/2012 1032   CO2 22 06/23/2012 1618   CO2 28 05/09/2012 1032   BUN 10.0 06/23/2012 1618   BUN 9 05/09/2012 1032   BUN 6 11/04/2011   CREATININE 0.9  06/23/2012 1618   CREATININE 0.72 05/09/2012 1032   CREATININE 0.7 11/04/2011   GLU 101 11/04/2011      Component Value Date/Time   CALCIUM 9.2 06/23/2012 1618   CALCIUM 9.4 05/09/2012 1032   ALKPHOS 68 06/23/2012 1618   ALKPHOS 54 05/09/2012 1032   AST 26 06/23/2012 1618   AST 27 05/09/2012 1032   ALT 24 06/23/2012 1618   ALT 25 05/09/2012 1032   BILITOT 0.44 06/23/2012 1618   BILITOT 0.6 05/09/2012 1032       Lab Results  Component Value Date   LABCA2 23 06/03/2012    No components found with this basename: [id number]    No results found for this basename: INR:1;PROTIME:1 in the last 168 hours  Urinalysis No results found for this basename: colorurine, appearanceur, labspec, phurine, glucoseu, hgbur, bilirubinur, ketonesur, proteinur, urobilinogen, nitrite, leukocytesur    STUDIES: Mr Biopsy/wire Localization  05/31/2012  **ADDENDUM** CREATED: 05/31/2012 08:56:22  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 This was found to be concordant by Dr. Norva Pavlov. The patient had an appointment with Dr. Emelia Matthews on May 30, 2012, and he gave her the results. She has recently been diagnosed with right breast cancer and is to follow her treatment plan.  Pathology results are dictated by Sonnie Alamo RN, BSN on May 31, 2012.  **END ADDENDUM** SIGNED BY: Blair Hailey. Manson Passey, M.D.   05/27/2012  *RADIOLOGY REPORT*  Clinical Data:  Known malignancy in the right breast.  Area of clumped enhancement in the lower outer quadrant of the left breast. The patient presents for biopsy.  MRI GUIDED VACUUM ASSISTED BIOPSY OF THE LEFT BREAST WITHOUT AND WITH CONTRAST  Comparison: Previous exams.  Technique: Multiplanar, multisequence MR images of the left breast were obtained prior to and following the intravenous administration of 13 ml of Mulithance.  I met with the patient, and we discussed the procedure of MRI guided biopsy, including risks, benefits, and alternatives. Specifically,  we discussed the risks of infection, bleeding, tissue injury, clip migration, and inadequate sampling.  Informed, written consent was given.  Using sterile technique, 2% Lidocaine, MRI guidance, and a 9 gauge vacuum assisted device, biopsy was performed of there is nodular enhancement in the lower outer quadrant of the left breast using a lateral approach.  At the conclusion of the procedure, an hourglass shaped tissue marker clip was deployed into the biopsy cavity.  IMPRESSION: MRI guided biopsy of the left breast. No apparent complications. The patient is scheduled to see Dr. Dwain Sarna for surgical consultation on 05/30/2012 at 8 o'clock a.m.  THREE-DIMENSIONAL MR IMAGE RENDERING ON INDEPENDENT WORKSTATION:  Three-dimensional MR images were rendered by post-processing of the original MR data on an independent workstation.  The three- dimensional MR images were interpreted, and findings were reported in the accompanying complete MRI report for this study.   Original Report Authenticated By: Norva Pavlov, M.D.    Nm Sentinel Node Inj-no Rpt (breast)  06/08/2012  CLINICAL DATA: right axillary lymph node biopsy   Sulfur colloid was injected intradermally by the nuclear medicine  technologist for breast cancer sentinel node localization.     Mm Digital Diagnostic Unilat L  05/27/2012  *RADIOLOGY REPORT*  Clinical Data:  Status post MR guided core biopsy of clumped nodular enhance in the lower quadrant of the left breast.  DIGITAL DIAGNOSTIC LEFT MAMMOGRAM  Comparison:  MRI on 05/27/2012  Findings:  Films are performed following MRI guided biopsy of enhancement in the lower outer quadrant of the left breast.  For an hourglass shaped clip is identified in the lower outer quadrant, in the expected location after biopsy.  IMPRESSION: Tissue marker clip is in the dilatation after biopsy.   Original Report Authenticated By: Norva Pavlov, M.D.     ASSESSMENT: 62 y.o. [place] woman status post right breast  biopsy December 2013 for a pT1a pN0, stage IA invasive ductal carcinoma, grade 2, estrogen receptor 100% positive, progesterone receptor 22% positive, with an MIB-1 of 6% and no HER-2 amplification  PLAN: We spent the better part of her hour plus visit discussing the biology of breast cancer in general and her own breast cancer in particular. She understands the recurrence rate of T1a breast cancers with local treatment only is less than 10% within 10 years. Mortality of course is practically unheard of. Accordingly the NCCN guidelines recommend only that endocrine treatment be considered.  We discussed aromatase inhibitors and tamoxifen, and I gave her written information on these drugs. She understands we will not start antiestrogens until she completes radiation, and given the fact that she needs to have further margin clearance surgery and possibly other drainage procedures, the start of her radiation treatments may be delayed. For this reason I have scheduled her to see me again in April. By then she should have recovered from her local treatments and will be ready to start antiestrogen therapy. Her initial reaction is that she will be interested in tamoxifen because of the osteopenia issue. Otherwise she knows to call for any problems that may develop before the next visit.   MAGRINAT,GUSTAV C    06/23/2012

## 2012-06-25 ENCOUNTER — Telehealth: Payer: Self-pay | Admitting: Oncology

## 2012-06-25 NOTE — Telephone Encounter (Signed)
lmonvm adviisng the pt of his April appt

## 2012-06-27 ENCOUNTER — Encounter (HOSPITAL_BASED_OUTPATIENT_CLINIC_OR_DEPARTMENT_OTHER): Payer: Self-pay | Admitting: Certified Registered Nurse Anesthetist

## 2012-06-27 ENCOUNTER — Encounter (HOSPITAL_BASED_OUTPATIENT_CLINIC_OR_DEPARTMENT_OTHER): Payer: Self-pay

## 2012-06-27 ENCOUNTER — Ambulatory Visit (HOSPITAL_BASED_OUTPATIENT_CLINIC_OR_DEPARTMENT_OTHER): Payer: 59 | Admitting: Certified Registered Nurse Anesthetist

## 2012-06-27 ENCOUNTER — Ambulatory Visit: Payer: 59 | Admitting: Radiation Oncology

## 2012-06-27 ENCOUNTER — Encounter (HOSPITAL_BASED_OUTPATIENT_CLINIC_OR_DEPARTMENT_OTHER)
Admission: RE | Disposition: A | Discharge status: Home / Self Care | Payer: Self-pay | Source: Ambulatory Visit | Attending: General Surgery

## 2012-06-27 ENCOUNTER — Ambulatory Visit (HOSPITAL_BASED_OUTPATIENT_CLINIC_OR_DEPARTMENT_OTHER)
Admission: RE | Admit: 2012-06-27 | Discharge: 2012-06-27 | Disposition: A | Discharge status: Home / Self Care | Payer: 59 | Source: Ambulatory Visit | Attending: General Surgery | Admitting: General Surgery

## 2012-06-27 ENCOUNTER — Ambulatory Visit: Payer: 59

## 2012-06-27 DIAGNOSIS — J45909 Unspecified asthma, uncomplicated: Secondary | ICD-10-CM | POA: Insufficient documentation

## 2012-06-27 DIAGNOSIS — E119 Type 2 diabetes mellitus without complications: Secondary | ICD-10-CM | POA: Insufficient documentation

## 2012-06-27 DIAGNOSIS — C50919 Malignant neoplasm of unspecified site of unspecified female breast: Secondary | ICD-10-CM

## 2012-06-27 DIAGNOSIS — I10 Essential (primary) hypertension: Secondary | ICD-10-CM | POA: Insufficient documentation

## 2012-06-27 HISTORY — PX: RE-EXCISION OF BREAST CANCER,SUPERIOR MARGINS: SHX6047

## 2012-06-27 LAB — POCT HEMOGLOBIN-HEMACUE: Hemoglobin: 11.8 g/dL — ABNORMAL LOW (ref 12.0–15.0)

## 2012-06-27 SURGERY — RE-EXCISION OF BREAST CANCER,SUPERIOR MARGINS
Anesthesia: General | Site: Breast | Laterality: Right | Wound class: Clean

## 2012-06-27 MED ORDER — HYDROMORPHONE HCL PF 1 MG/ML IJ SOLN
0.2500 mg | INTRAMUSCULAR | Status: DC | PRN
  Administered 2012-06-27 (×3): 0.5 mg via INTRAVENOUS

## 2012-06-27 MED ORDER — FENTANYL CITRATE 0.05 MG/ML IJ SOLN
INTRAMUSCULAR | Status: DC | PRN
  Administered 2012-06-27: 50 ug via INTRAVENOUS

## 2012-06-27 MED ORDER — LACTATED RINGERS IV SOLN
INTRAVENOUS | Status: DC
  Administered 2012-06-27 (×3): via INTRAVENOUS

## 2012-06-27 MED ORDER — OXYCODONE HCL 5 MG/5ML PO SOLN: 5.0000 mg | Freq: Once | ORAL | Status: AC | PRN

## 2012-06-27 MED ORDER — LIDOCAINE HCL (CARDIAC) 20 MG/ML IV SOLN
INTRAVENOUS | Status: DC | PRN
  Administered 2012-06-27: 60 mg via INTRAVENOUS

## 2012-06-27 MED ORDER — PROPOFOL 10 MG/ML IV BOLUS
INTRAVENOUS | Status: DC | PRN
  Administered 2012-06-27: 160 mg via INTRAVENOUS

## 2012-06-27 MED ORDER — BUPIVACAINE HCL (PF) 0.25 % IJ SOLN
INTRAMUSCULAR | Status: DC | PRN
  Administered 2012-06-27: 10 mL

## 2012-06-27 MED ORDER — DEXAMETHASONE SODIUM PHOSPHATE 4 MG/ML IJ SOLN
INTRAMUSCULAR | Status: DC | PRN
  Administered 2012-06-27: 10 mg via INTRAVENOUS

## 2012-06-27 MED ORDER — MIDAZOLAM HCL 5 MG/5ML IJ SOLN
INTRAMUSCULAR | Status: DC | PRN
  Administered 2012-06-27: 1 mg via INTRAVENOUS

## 2012-06-27 MED ORDER — EPHEDRINE SULFATE 50 MG/ML IJ SOLN
INTRAMUSCULAR | Status: DC | PRN
  Administered 2012-06-27 (×3): 10 mg via INTRAVENOUS

## 2012-06-27 MED ORDER — OXYCODONE HCL 5 MG PO TABS
5.0000 mg | ORAL_TABLET | Freq: Once | ORAL | Status: AC | PRN
  Administered 2012-06-27: 5 mg via ORAL

## 2012-06-27 MED ORDER — ONDANSETRON HCL 4 MG/2ML IJ SOLN
INTRAMUSCULAR | Status: DC | PRN
  Administered 2012-06-27: 4 mg via INTRAVENOUS

## 2012-06-27 MED ORDER — ACETAMINOPHEN 10 MG/ML IV SOLN
1000.0000 mg | Freq: Once | INTRAVENOUS | Status: AC
  Administered 2012-06-27: 1000 mg via INTRAVENOUS

## 2012-06-27 MED ORDER — OXYCODONE-ACETAMINOPHEN 10-325 MG PO TABS: 1.0000 | ORAL_TABLET | Freq: Four times a day (QID) | ORAL | Status: DC | PRN

## 2012-06-27 MED ORDER — VANCOMYCIN HCL IN DEXTROSE 1-5 GM/200ML-% IV SOLN
1000.0000 mg | INTRAVENOUS | Status: AC
  Administered 2012-06-27 (×2): 1000 mg via INTRAVENOUS

## 2012-06-27 SURGICAL SUPPLY — 54 items
ADH SKN CLS APL DERMABOND .7 (GAUZE/BANDAGES/DRESSINGS) ×1
APL SKNCLS STERI-STRIP NONHPOA (GAUZE/BANDAGES/DRESSINGS) ×1
APPLIER CLIP 9.375 MED OPEN (MISCELLANEOUS)
APR CLP MED 9.3 20 MLT OPN (MISCELLANEOUS)
BENZOIN TINCTURE PRP APPL 2/3 (GAUZE/BANDAGES/DRESSINGS) ×2 IMPLANT
BINDER BREAST LRG (GAUZE/BANDAGES/DRESSINGS) IMPLANT
BINDER BREAST MEDIUM (GAUZE/BANDAGES/DRESSINGS) IMPLANT
BINDER BREAST XLRG (GAUZE/BANDAGES/DRESSINGS) IMPLANT
BINDER BREAST XXLRG (GAUZE/BANDAGES/DRESSINGS) IMPLANT
BLADE SURG 15 STRL LF DISP TIS (BLADE) ×1 IMPLANT
BLADE SURG 15 STRL SS (BLADE) ×2
CANISTER SUCTION 1200CC (MISCELLANEOUS) IMPLANT
CHLORAPREP W/TINT 26ML (MISCELLANEOUS) ×2 IMPLANT
CLIP APPLIE 9.375 MED OPEN (MISCELLANEOUS) IMPLANT
CLOTH BEACON ORANGE TIMEOUT ST (SAFETY) ×2 IMPLANT
COVER MAYO STAND STRL (DRAPES) ×2 IMPLANT
COVER TABLE BACK 60X90 (DRAPES) ×2 IMPLANT
DECANTER SPIKE VIAL GLASS SM (MISCELLANEOUS) ×1 IMPLANT
DERMABOND ADVANCED (GAUZE/BANDAGES/DRESSINGS) ×1
DERMABOND ADVANCED .7 DNX12 (GAUZE/BANDAGES/DRESSINGS) IMPLANT
DEVICE DUBIN W/COMP PLATE 8390 (MISCELLANEOUS) IMPLANT
DRAPE PED LAPAROTOMY (DRAPES) ×2 IMPLANT
DRSG TEGADERM 4X4.75 (GAUZE/BANDAGES/DRESSINGS) ×2 IMPLANT
ELECT COATED BLADE 2.86 ST (ELECTRODE) ×2 IMPLANT
ELECT REM PT RETURN 9FT ADLT (ELECTROSURGICAL) ×2
ELECTRODE REM PT RTRN 9FT ADLT (ELECTROSURGICAL) ×1 IMPLANT
GAUZE SPONGE 4X4 12PLY STRL LF (GAUZE/BANDAGES/DRESSINGS) ×2 IMPLANT
GLOVE BIO SURGEON STRL SZ7 (GLOVE) ×3 IMPLANT
GLOVE BIOGEL PI IND STRL 7.5 (GLOVE) ×1 IMPLANT
GLOVE BIOGEL PI INDICATOR 7.5 (GLOVE) ×1
GOWN PREVENTION PLUS XLARGE (GOWN DISPOSABLE) ×3 IMPLANT
NDL HYPO 25X1 1.5 SAFETY (NEEDLE) ×1 IMPLANT
NEEDLE HYPO 25X1 1.5 SAFETY (NEEDLE) ×2 IMPLANT
NS IRRIG 1000ML POUR BTL (IV SOLUTION) ×1 IMPLANT
PACK BASIN DAY SURGERY FS (CUSTOM PROCEDURE TRAY) ×2 IMPLANT
PENCIL BUTTON HOLSTER BLD 10FT (ELECTRODE) ×2 IMPLANT
SLEEVE SCD COMPRESS KNEE MED (MISCELLANEOUS) ×2 IMPLANT
SPONGE LAP 4X18 X RAY DECT (DISPOSABLE) ×2 IMPLANT
STRIP CLOSURE SKIN 1/2X4 (GAUZE/BANDAGES/DRESSINGS) ×2 IMPLANT
SUT MNCRL AB 3-0 PS2 18 (SUTURE) IMPLANT
SUT MNCRL AB 4-0 PS2 18 (SUTURE) ×1 IMPLANT
SUT SILK 2 0 SH (SUTURE) ×2 IMPLANT
SUT VIC AB 2-0 SH 27 (SUTURE) ×4
SUT VIC AB 2-0 SH 27XBRD (SUTURE) ×1 IMPLANT
SUT VIC AB 3-0 SH 27 (SUTURE) ×2
SUT VIC AB 3-0 SH 27X BRD (SUTURE) ×1 IMPLANT
SUT VIC AB 5-0 PS2 18 (SUTURE) IMPLANT
SUT VICRYL AB 3 0 TIES (SUTURE) IMPLANT
SYR CONTROL 10ML LL (SYRINGE) ×2 IMPLANT
TOWEL OR 17X24 6PK STRL BLUE (TOWEL DISPOSABLE) ×2 IMPLANT
TOWEL OR NON WOVEN STRL DISP B (DISPOSABLE) ×1 IMPLANT
TUBE CONNECTING 20X1/4 (TUBING) IMPLANT
WATER STERILE IRR 1000ML POUR (IV SOLUTION) ×1 IMPLANT
YANKAUER SUCT BULB TIP NO VENT (SUCTIONS) IMPLANT

## 2012-06-27 NOTE — Anesthesia Procedure Notes (Signed)
Procedure Name: LMA Insertion Date/Time: 06/27/2012 7:41 AM Performed by: Jett Fukuda D Pre-anesthesia Checklist: Patient identified, Emergency Drugs available, Suction available and Patient being monitored Patient Re-evaluated:Patient Re-evaluated prior to inductionOxygen Delivery Method: Circle System Utilized Preoxygenation: Pre-oxygenation with 100% oxygen Intubation Type: IV induction Ventilation: Mask ventilation without difficulty LMA: LMA inserted LMA Size: 4.0 Number of attempts: 1 Airway Equipment and Method: bite block Placement Confirmation: positive ETCO2 Tube secured with: Tape Dental Injury: Teeth and Oropharynx as per pre-operative assessment

## 2012-06-27 NOTE — Interval H&P Note (Signed)
History and Physical Interval Note:  06/27/2012 7:33 AM Path showed a positive superior margin which I cleared before with excision of more margin.  Her anterior margin is still positive and I think we need to make sure this is negative and reexcise.  She also had axillary seroma that has been drained twice but is now gone. Alisha Matthews  has presented today for surgery, with the diagnosis of right breast cancer  The various methods of treatment have been discussed with the patient and family. After consideration of risks, benefits and other options for treatment, the patient has consented to  Procedure(s) (LRB) with comments: RE-EXCISION OF BREAST CANCER,SUPERIOR MARGINS (Right) as a surgical intervention .  The patient's history has been reviewed, patient examined, no change in status, stable for surgery.  I have reviewed the patient's chart and labs.  Questions were answered to the patient's satisfaction.     Benoit Meech

## 2012-06-27 NOTE — H&P (View-Only) (Signed)
Subjective:     Patient ID: Alisha Matthews, female   DOB: [birth year], 62 y.o.   MRN: [id number]  HPI This is a 62 year old female I saw last week after a newly diagnosed right breast cancer. She reports no changes today. She comes back in followup for several things. On her MR she had an area on her liver that was recommended for further evaluation. This ends up being a complex cyst of her liver requiring no further evaluation. Additionally her HER-2/neu is not amplified. She also had a suspicious left breast enhancement. This has undergone MR biopsy and shows this to be a fibroadenoma with no atypia or malignancy. This is concordant and does not need any further evaluation. We discussed previously a lumpectomy as well as sentinel node biopsy after discussing all the options and that is what she would like to proceed with today. Additionally we did talk a little bit oabout diet which she states she is become mostly a vegetarian and is now has a heavily soy-based diet. I did recommend her we'll talk again a later date do not plan on for the long-term having a heavy soy-based diet.  Review of Systems     Objective:   Physical Exam deferred    Assessment:     Right breast cancer, clinical stage II    Plan:     Right breast lumpectomy, snbx as discussed last week.

## 2012-06-27 NOTE — Anesthesia Postprocedure Evaluation (Signed)
  Anesthesia Post-op Note  Patient: Alisha Matthews  Procedure(s) Performed: Procedure(s) (LRB) with comments: RE-EXCISION OF BREAST CANCER,SUPERIOR MARGINS (Right)  Patient Location: PACU  Anesthesia Type:General  Level of Consciousness: awake, alert  and oriented  Airway and Oxygen Therapy: Patient Spontanous Breathing  Post-op Pain: mild  Post-op Assessment: Post-op Vital signs reviewed, Patient's Cardiovascular Status Stable, Respiratory Function Stable, Patent Airway and No signs of Nausea or vomiting  Post-op Vital Signs: Reviewed and stable  Complications: No apparent anesthesia complications

## 2012-06-27 NOTE — Op Note (Signed)
Preoperative diagnosis: Clinical stage I right breast cancer, Status post lumpectomy and sentinel lymph node biopsy with positive margin Postoperative diagnosis: Same as above Procedure: Reexcision of right breast anterior margin Surgeon: Dr. Harden Mo Anesthesia: Gen. With LMA Estimated blood loss: Minimal Specimens: Right breast tissue marked with paint to pathology Complications: None Drains: None Sponge and needle count was correct x2 at operation Disposition to recovery in stable condition  Indications: This is a 62 year old female who I recently did a lumpectomy and sentinel lymph node biopsy for a stage I right breast cancer. In the operating room I noticed I was close to my superior margin and I reexcised this to make this clear. On the final pathology this is focally positive at her anterior margin. I discussed with her returning to excise a little bit more this anterior margin to make sure we have clear margins. She was agreeable to this. She also in the interim had an axillary seroma that has been drained twice that is now resolved.  Procedure: After informed consent was obtained the patient was taken to the operating room. She was administered vancomycin due to her penicillin allergy. Sequential compression devices were on her legs. She was placed under general anesthesia with an LMA. Her right breast was prepped and draped in the standard sterile surgical fashion. A surgical timeout was performed.  I then reentered the old incision and excised a portion of her skin. I then used cautery to excise the anterior margin in the region of the tumor. I entered the seroma cavity which was fairly small during this also. This was marked with paint. I then obtained hemostasis. Irrigation was performed. I then reclosed the seroma cavity with 2-0 Vicryl suture. The deep breast tissue and dermis were closed with 3-0 Vicryl as well. The skin was closed for Monocryl. I infiltrated Marcaine  throughout this area. I then placed Steri-Strips and Dermabond overlying this. She tolerated this well and was transferred to recovery.

## 2012-06-27 NOTE — Anesthesia Preprocedure Evaluation (Signed)
Anesthesia Evaluation  Patient identified by MRN, date of birth, ID band Patient awake    Reviewed: Allergy & Precautions, H&P , NPO status , Patient's Chart, lab work & pertinent test results  Airway Mallampati: I TM Distance: >3 FB Neck ROM: Full    Dental No notable dental hx. (+) Teeth Intact and Dental Advisory Given   Pulmonary asthma ,  breath sounds clear to auscultation  Pulmonary exam normal       Cardiovascular hypertension, Rhythm:Regular Rate:Normal     Neuro/Psych PSYCHIATRIC DISORDERS negative neurological ROS     GI/Hepatic Neg liver ROS, Medicated and Controlled,  Endo/Other  diabetes, Type 2, Oral Hypoglycemic Agents  Renal/GU negative Renal ROS  negative genitourinary   Musculoskeletal   Abdominal   Peds  Hematology negative hematology ROS (+)   Anesthesia Other Findings   Reproductive/Obstetrics negative OB ROS                           Anesthesia Physical Anesthesia Plan  ASA: II  Anesthesia Plan: General   Post-op Pain Management:    Induction: Intravenous  Airway Management Planned: LMA  Additional Equipment:   Intra-op Plan:   Post-operative Plan: Extubation in OR  Informed Consent: I have reviewed the patients History and Physical, chart, labs and discussed the procedure including the risks, benefits and alternatives for the proposed anesthesia with the patient or authorized representative who has indicated his/her understanding and acceptance.   Dental advisory given  Plan Discussed with: CRNA  Anesthesia Plan Comments:         Anesthesia Quick Evaluation

## 2012-06-27 NOTE — Transfer of Care (Signed)
Immediate Anesthesia Transfer of Care Note  Patient: Alisha Matthews  Procedure(s) Performed: Procedure(s) (LRB) with comments: RE-EXCISION OF BREAST CANCER,SUPERIOR MARGINS (Right)  Patient Location: PACU  Anesthesia Type:General  Level of Consciousness: awake, alert , oriented and patient cooperative  Airway & Oxygen Therapy: Patient Spontanous Breathing and Patient connected to face mask oxygen  Post-op Assessment: Report given to PACU RN and Post -op Vital signs reviewed and stable  Post vital signs: Reviewed and stable  Complications: No apparent anesthesia complications

## 2012-06-28 ENCOUNTER — Encounter (HOSPITAL_BASED_OUTPATIENT_CLINIC_OR_DEPARTMENT_OTHER): Payer: Self-pay | Admitting: General Surgery

## 2012-06-29 ENCOUNTER — Telehealth (INDEPENDENT_AMBULATORY_CARE_PROVIDER_SITE_OTHER): Payer: Self-pay

## 2012-06-29 NOTE — Telephone Encounter (Signed)
Called pt to notify her of the path results were benign per Dr Dwain Sarna.

## 2012-07-01 ENCOUNTER — Encounter (INDEPENDENT_AMBULATORY_CARE_PROVIDER_SITE_OTHER): Payer: Commercial Managed Care - PPO | Admitting: General Surgery

## 2012-07-04 ENCOUNTER — Encounter: Payer: Self-pay | Admitting: *Deleted

## 2012-07-04 NOTE — Progress Notes (Signed)
Mailed after appt letter to pt. 

## 2012-07-07 ENCOUNTER — Telehealth (INDEPENDENT_AMBULATORY_CARE_PROVIDER_SITE_OTHER): Payer: Self-pay | Admitting: General Surgery

## 2012-07-07 NOTE — Telephone Encounter (Signed)
Pt called to request Rx be FAXd to [place] at the [facility], so she can attend the ABC class.  Dr. Derrell Lolling signed for Dr. Dwain Sarna and script then [facility] to [phone].

## 2012-07-11 ENCOUNTER — Ambulatory Visit: Payer: 59

## 2012-07-11 ENCOUNTER — Ambulatory Visit: Payer: 59 | Admitting: Radiation Oncology

## 2012-07-12 ENCOUNTER — Encounter: Payer: Self-pay | Admitting: Dietician

## 2012-07-12 NOTE — Progress Notes (Signed)
Breast Cancer Nutrition Class Attendance Note  Date: 07/12/2012  Pt attended [facility]'s Breast Cancer Nutrition Class, "Food For Your Fight". Pt was educated on basic cancer nutrition principles, including plant based diet and principles from [employer]  [employer] for [employer]) about latest nutrition findings and recommendations. Questions answered. Handouts and recipes provided.   Melody Haver, RD, LDN Pager: [phone]

## 2012-07-19 ENCOUNTER — Encounter: Payer: Self-pay | Admitting: Radiation Oncology

## 2012-07-19 ENCOUNTER — Encounter (INDEPENDENT_AMBULATORY_CARE_PROVIDER_SITE_OTHER): Payer: Self-pay

## 2012-07-19 NOTE — Progress Notes (Signed)
62 year old. Female.   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 S/P Re-excision following right breast lumpectomy and sentinel node biopsy on 06/27/12. To follow up with Dr. Darnelle Catalan in April to discuss antiestrogen therapy further.   AX: penicillin  No hx of radiation therapy No indication of a pacemaker

## 2012-07-20 ENCOUNTER — Encounter (INDEPENDENT_AMBULATORY_CARE_PROVIDER_SITE_OTHER): Payer: Self-pay | Admitting: General Surgery

## 2012-07-20 ENCOUNTER — Ambulatory Visit
Admission: RE | Admit: 2012-07-20 | Discharge: 2012-07-20 | Disposition: A | Discharge status: Home / Self Care | Payer: 59 | Source: Ambulatory Visit | Attending: Radiation Oncology | Admitting: Radiation Oncology

## 2012-07-20 ENCOUNTER — Encounter: Payer: Self-pay | Admitting: Radiation Oncology

## 2012-07-20 ENCOUNTER — Telehealth: Payer: Self-pay | Admitting: Radiation Oncology

## 2012-07-20 ENCOUNTER — Ambulatory Visit (INDEPENDENT_AMBULATORY_CARE_PROVIDER_SITE_OTHER): Payer: Commercial Managed Care - PPO | Admitting: General Surgery

## 2012-07-20 VITALS — BP 115/79 | HR 82 | Temp 98.3°F | Resp 18 | Ht 63.0 in | Wt 154.5 lb

## 2012-07-20 VITALS — BP 120/72 | HR 72 | Temp 99.6°F | Resp 16 | Ht 64.0 in | Wt 150.0 lb

## 2012-07-20 DIAGNOSIS — C50219 Malignant neoplasm of upper-inner quadrant of unspecified female breast: Secondary | ICD-10-CM | POA: Insufficient documentation

## 2012-07-20 DIAGNOSIS — E119 Type 2 diabetes mellitus without complications: Secondary | ICD-10-CM | POA: Insufficient documentation

## 2012-07-20 DIAGNOSIS — Z79899 Other long term (current) drug therapy: Secondary | ICD-10-CM | POA: Insufficient documentation

## 2012-07-20 DIAGNOSIS — K219 Gastro-esophageal reflux disease without esophagitis: Secondary | ICD-10-CM | POA: Insufficient documentation

## 2012-07-20 DIAGNOSIS — E785 Hyperlipidemia, unspecified: Secondary | ICD-10-CM | POA: Insufficient documentation

## 2012-07-20 NOTE — Addendum Note (Signed)
Encounter addended by: Billie Lade, MD on: 07/20/2012 12:19 PM<BR>     Documentation filed: Visit Diagnoses, Inpatient Notes, Notes Section

## 2012-07-20 NOTE — Addendum Note (Signed)
Encounter addended by: Agnes Lawrence, RN on: 07/20/2012 10:38 AM<BR>     Documentation filed: Notes Section, Vitals Section

## 2012-07-20 NOTE — Progress Notes (Deleted)
See progress note under physician encounter. 

## 2012-07-20 NOTE — Progress Notes (Signed)
See progress note under physician encounter. 

## 2012-07-20 NOTE — Telephone Encounter (Signed)
Faxed dictated note from today's consultation with Dr. Roselind Messier attention Rashidu Illene Bolus as requested by the patient. Confirmation fax of deliver obtained.

## 2012-07-20 NOTE — Addendum Note (Signed)
Encounter addended by: Agnes Lawrence, RN on: 07/20/2012  1:06 PM<BR>     Documentation filed: Charges VN, Inpatient Patient Education, Inpatient Document Flowsheet, Notes Section

## 2012-07-20 NOTE — Progress Notes (Signed)
Patient presents to the clinic today accompanied by her husband for a consultation with Dr. Roselind Messier to discuss the role of radiation therapy in the treatment of right breast ca. Patient is alert and oriented to person, place, and time. No distress noted. Steady gait noted. Pleasant affect noted. Patient denies pain in her right breast at this time. Patient denies nipple discharge.Patient's right breast surgical incisions are well approximated without redness, drainage, or edema. Patient demonstrates full ROM of upper extremities. No edema of the right arm noted. Patient denies nausea, vomiting, headache, dizziness, or diarrhea. Reported all findings to Dr. Roselind Messier.

## 2012-07-20 NOTE — Progress Notes (Signed)
Complete PATIENT MEASURE OF DISTRESS worksheet with a score of 4 submitted to social work.

## 2012-07-20 NOTE — Addendum Note (Signed)
Encounter addended by: Agnes Lawrence, RN on: 07/20/2012 10:13 AM<BR>     Documentation filed: Vitals Section, Chief Complaint Section

## 2012-07-20 NOTE — Progress Notes (Addendum)
Radiation Oncology         [phone] ________________________________  Initial outpatient Consultation  Name: Alisha Matthews MRN: [id number]  Date: 07/20/2012  DOB: 1951/01/20  WJ:XBJYN,WGNFAO, MD  Emelia Loron, MD   REFERRING PHYSICIAN: Emelia Loron, MD  DIAGNOSIS:  Stage IA (pT1a, pN0, Mx), grade 2 invasive ductal carcinoma the right breast  HISTORY OF PRESENT ILLNESS::Alisha Matthews is a 62 y.o. female who is seen out of the courtesy of Dr. Emelia Loron for an opinion concerning radiation therapy as part of management of patient's recently diagnosed stage I right breast cancer.   last year on routine screening mammography the patient was found to have an area of concern in the upper inner aspect of the right breast.. Additional imaging confirmed this area and a biopsy was performed which revealed invasive ductal carcinoma with associated ductal carcinoma in situ. The tumor was estrogen receptor positive at 100% and progesterone receptor moderately staining intensity of 22%. HER-2/neu showed no calcification. An MRI was performed which revealed abnormal enhancement in the upper-inner quadrant of the right breast corresponding to the biopsy-proven carcinoma. Within the left breast there was a focus of  suspicious enhancement in the 4:00 position. This was biopsied and returned benign disease.  the patient proceeded to undergo right breast lumpectomy and sentinel procedure on december 18th 2013. This showed invasive ductal carcinoma, grade 2 measuring 0.5 cm in greatest dimension. The superior margin was close but additional tissue was obtained clearing this margin. There was invasive carcinoma  noted focally at the anterior margin.  The sentinel node showed no evidence of metastasis.  The patient was taken back to the operating room at a later date and underwent reexcision of the right breast. This showed benign breast parenchyma with fat necrosis but no residual malignancy. Patient  has recovered well from her surgeries and is now seen in radiation oncology for breast conservation therapy.Marland Kitchen  PREVIOUS RADIATION THERAPY: No  PAST MEDICAL HISTORY:  has a past medical history of Anxiety; Arthritis; Diabetes mellitus; GERD (gastroesophageal reflux disease); Hyperlipidemia; Insomnia, unspecified; Leukocytopenia, unspecified; Barrett's esophagus; Breast fibrocystic disorder; Genital warts; Osteoarthrosis, unspecified whether generalized or localized, ankle and foot; Exercise induced bronchospasm; Personal history of colonic polyps; Migraine, unspecified, without mention of intractable migraine without mention of status migrainosus; Symptomatic menopausal or female climacteric states; Obesity, unspecified; Essential hypertension, benign; Depressive disorder, not elsewhere classified; Anemia, unspecified; Chicken pox; Measles; Mumps; and Breast cancer.    PAST SURGICAL HISTORY: Past Surgical History  Procedure Date  . Cholecystectomy   . Bunionectomy     3 surgeries on both feet  . Nasal septum surgery   . Total hip arthroplasty     left      Perthes disease  . Foot surgery     Left-revision  . Dilation and curettage of uterus 1978  . Breast lumpectomy with needle localization and axillary sentinel lymph node bx 06/08/2012    Procedure: BREAST LUMPECTOMY WITH NEEDLE LOCALIZATION AND AXILLARY SENTINEL LYMPH NODE BX;  Surgeon: Emelia Loron, MD;  Location: [facility];  Service: General;  Laterality: Right;  . Re-excision of breast cancer,superior margins 06/27/2012    Procedure: RE-EXCISION OF BREAST CANCER,SUPERIOR MARGINS;  Surgeon: Emelia Loron, MD;  Location: [facility];  Service: General;  Laterality: Right;    FAMILY HISTORY: family history includes Cancer (age of onset:62) in her maternal grandmother; Depression in her brother; Diabetes in her mother; Heart failure in her father and mother; and Hypertension in  her mother.  SOCIAL  HISTORY:  reports that she has never smoked. She has never used smokeless tobacco. She reports that she does not drink alcohol or use illicit drugs.  ALLERGIES: Penicillins  MEDICATIONS:  Current Outpatient Prescriptions  Medication Sig Dispense Refill  . albuterol (PROVENTIL HFA;VENTOLIN HFA) 108 (90 BASE) MCG/ACT inhaler Inhale 2 puffs into the lungs every 6 (six) hours as needed. Asthmatic attack      . ALPRAZolam (XANAX) 0.5 MG tablet Take 0.5 mg by mouth at bedtime as needed.      . clindamycin (CLEOCIN) 150 MG capsule Take 150 mg by mouth as needed. Takes 4 as needed prior to dental appt      . diclofenac sodium (VOLTAREN) 1 % GEL Apply 1 application topically as needed. Joint pain ankle      . Diclofenac-Misoprostol (ARTHROTEC PO) Take 1 tablet by mouth 3 (three) times daily.      . diphenhydrAMINE (BENADRYL) 25 MG tablet Take 25 mg by mouth at bedtime as needed. When not taking xanax.      . esomeprazole (NEXIUM) 40 MG capsule Take 40 mg by mouth 2 (two) times daily.      Marland Kitchen estradiol (ESTRACE) 0.1 MG/GM vaginal cream Place 2 g vaginally once a week.      . frovatriptan (FROVA) 2.5 MG tablet Take 2.5 mg by mouth as needed. If recurs, may repeat after 2 hours. Max of 3 tabs in 24 hours.      Marland Kitchen lisinopril (PRINIVIL,ZESTRIL) 5 MG tablet Take 5 mg by mouth daily.      . metFORMIN (GLUMETZA) 500 MG (MOD) 24 hr tablet Take 500 mg by mouth daily with breakfast.      . PARoxetine (PAXIL) 40 MG tablet Take 40 mg by mouth daily.      Marland Kitchen oxyCODONE-acetaminophen (PERCOCET) 10-325 MG per tablet Take 1 tablet by mouth every 6 (six) hours as needed for pain.  20 tablet  0  . oxyCODONE-acetaminophen (ROXICET) 5-325 MG per tablet Take 1 tablet by mouth every 4 (four) hours as needed for pain.  30 tablet  0    REVIEW OF SYSTEMS:  A 15 point review of systems is documented in the electronic medical record. This was obtained by the nursing staff. However, I reviewed this with the patient to discuss relevant  findings and make appropriate changes. She denies any pain within the right breast area nipple discharge or bleeding. She denies any problems with swelling in her right arm or hand. Prior to diagnosis the patient denies any pain within the breast nipple discharge or bleeding. Patient denies any new bony pain headaches dizziness or blurred vision   PHYSICAL EXAM:  height is 5\' 3"  (1.6 m) and weight is 154 lb 8 oz (70.081 kg). Her oral temperature is 98.3 F (36.8 C). Her blood pressure is 115/79 and her pulse is 82. Her respiration is 18 and oxygen saturation is 100%.   BP 115/79  Pulse 82  Temp 98.3 F (36.8 C) (Oral)  Resp 18  Ht 5\' 3"  (1.6 m)  Wt 154 lb 8 oz (70.081 kg)  BMI 27.37 kg/m2  SpO2 100%  General Appearance:    Alert, cooperative, no distress, appears stated age  Head:    Normocephalic, without obvious abnormality, atraumatic  Eyes:    PERRL, conjunctiva/corneas clear, EOM's intact,       Nose:   Nares normal, septum midline, mucosa normal, no drainage    or sinus tenderness  Throat:  Lips, mucosa, and tongue normal; teeth and gums normal  Neck:   Supple, symmetrical, trachea midline, no adenopathy;    thyroid:  no enlargement/tenderness/nodules; no carotid   bruit or JVD  Back:     Symmetric, no curvature, ROM normal, no CVA tenderness  Lungs:     Clear to auscultation bilaterally, respirations unlabored  Chest Wall:    No tenderness or deformity   Heart:     Regular rate and rhythm, , no murmur, rub   or gallop  Breast Exam:    No tenderness, masses, or nipple abnormality along the left breast, the right breast area shows a lumpectomy scar in the upper central aspect which is healed well without signs of drainage or infection, the right axilla shows a well healing scar from the patient's sentinel node procedure  Abdomen:     Soft, non-tender, bowel sounds active all four quadrants,    no masses, no organomegaly        Extremities:   Extremities normal, atraumatic, no  cyanosis or edema, or v. veins along the left lower extremity   Pulses:   2+ and symmetric all extremities  Skin:   Skin color, texture, turgor normal, no rashes or lesions  Lymph nodes:   Cervical, supraclavicular, and axillary nodes normal  Neurologic:   normal strength throughout    ECOG = 0    LABORATORY DATA:  Lab Results  Component Value Date   WBC 6.2 06/23/2012   HGB 11.8* 06/27/2012   HCT 34.2* 06/23/2012   MCV 91.4 06/23/2012   PLT 194 06/23/2012   Lab Results  Component Value Date   NA 137 06/23/2012   K 3.6 06/23/2012   CL 102 06/23/2012   CO2 22 06/23/2012   Lab Results  Component Value Date   ALT 24 06/23/2012   AST 26 06/23/2012   ALKPHOS 68 06/23/2012   BILITOT 0.44 06/23/2012     RADIOGRAPHY: MRI results as above    IMPRESSION: Stage IA (pT1a, pN0, Mx), grade 2 invasive ductal carcinoma the right breast. Patient would be a excellent candidate for breast conserving therapy with radiation therapy encompassing the right breast area.  PLAN: Simulation and planning on 07/27/2011. I spent 60 minutes minutes face to face with the patient and more than 50% of that time was spent in counseling and/or coordination of care.   ------------------------------------------------  -----------------------------------  Billie Lade, PhD, MD

## 2012-07-20 NOTE — Progress Notes (Signed)
Subjective:     Patient ID: Alisha Matthews, female   DOB: May 24, 1951, 62 y.o.   MRN: [id number]  HPI 52 yof who underwent left breast lumpectomy and sentinel node biopsy with positive margin.  She then underwent re-excision of positive margin with the result being negative margins.  She has stage I left breast cancer with node negative and margins negative now.  She is hormone receptor positive and her2 not amplified.  She is doing well after surgery and comes in today without any complaints.  She has seen med onc and rad onc to discuss adjuvant therapy  She had decided postop that she was not going to do either but she has now agreed to xrt but is still considering antiestrogen therapy.  She has been to cancer nutrition class.  Review of Systems     Objective:   Physical Exam Healing left breast and left axillary incision without infection, no seroma present (she had axillary seroma drained previously)    Assessment:     Left breast cancer    Plan:     She can begin radiation from my standpoint at any time now without delay.  I released her to full activity.  She can go to abc class.  I will see back in 6 months or sooner if needed. We discussed for about 30 minutes adjuvant therapy and the indications for both radiation as well as consideration of antiestrogen therapy.

## 2012-07-20 NOTE — Patient Instructions (Signed)

## 2012-07-26 ENCOUNTER — Ambulatory Visit
Admission: RE | Admit: 2012-07-26 | Discharge: 2012-07-26 | Disposition: A | Discharge status: Home / Self Care | Payer: 59 | Source: Ambulatory Visit | Attending: Radiation Oncology | Admitting: Radiation Oncology

## 2012-07-26 DIAGNOSIS — C50219 Malignant neoplasm of upper-inner quadrant of unspecified female breast: Secondary | ICD-10-CM | POA: Insufficient documentation

## 2012-07-26 DIAGNOSIS — R5381 Other malaise: Secondary | ICD-10-CM | POA: Insufficient documentation

## 2012-07-26 DIAGNOSIS — Z510 Encounter for antineoplastic radiation therapy: Secondary | ICD-10-CM | POA: Insufficient documentation

## 2012-07-26 DIAGNOSIS — Z79899 Other long term (current) drug therapy: Secondary | ICD-10-CM | POA: Insufficient documentation

## 2012-07-27 NOTE — Progress Notes (Signed)
  Radiation Oncology         [phone] ________________________________  Name: Alisha Matthews MRN: [id number]  Date: 07/26/2012  DOB: [birth year]  SIMULATION AND TREATMENT PLANNING NOTE  DIAGNOSIS: Stage I invasive ductal carcinoma of the right breast  NARRATIVE:  The patient was brought to the CT Simulation planning suite.  Identity was confirmed.  All relevant records and images related to the planned course of therapy were reviewed.  The patient freely provided informed written consent to proceed with treatment after reviewing the details related to the planned course of therapy. The consent form was witnessed and verified by the simulation staff.  Then, the patient was set-up in a stable reproducible  supine position for radiation therapy.  CT images were obtained.  Surface markings were placed.  The CT images were loaded into the planning software.  Then the target and avoidance structures were contoured.  Treatment planning then occurred.  The radiation prescription was entered and confirmed.  Then, I designed and supervised the construction of a total of 3 medically necessary complex treatment devices.  I have requested : Isodose Plan.  I have ordered:dose calc.  PLAN:  The patient will receive 60.4 Gy in 33 fractions.  ________________________________  -----------------------------------  Billie Lade, PhD, MD

## 2012-08-02 ENCOUNTER — Telehealth: Payer: Self-pay | Admitting: Radiation Oncology

## 2012-08-02 ENCOUNTER — Ambulatory Visit
Admission: RE | Admit: 2012-08-02 | Discharge: 2012-08-02 | Disposition: A | Discharge status: Home / Self Care | Payer: 59 | Source: Ambulatory Visit | Attending: Radiation Oncology | Admitting: Radiation Oncology

## 2012-08-02 DIAGNOSIS — C50219 Malignant neoplasm of upper-inner quadrant of unspecified female breast: Secondary | ICD-10-CM

## 2012-08-02 NOTE — Telephone Encounter (Signed)
Met w patient to discuss RO billing. 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  Dx: Cancer of upper-inner quadrant of female breast - Primary 174.2  Attending Rad: JK  Rad Tx: Daily

## 2012-08-02 NOTE — Progress Notes (Signed)
  Radiation Oncology         [phone]) [phone] ________________________________  Name: Alisha Matthews MRN: [id number]  Date: 08/02/2012  DOB: Jan 13, 1951  Simulation Verification Note  Status: outpatient  NARRATIVE: The patient was brought to the treatment unit and placed in the planned treatment position. The clinical setup was verified. Then port films were obtained and uploaded to the radiation oncology medical record software.  The treatment beams were carefully compared against the planned radiation fields. The position location and shape of the radiation fields was reviewed. They targeted volume of tissue appears to be appropriately covered by the radiation beams. Organs at risk appear to be excluded as planned.  Based on my personal review, I approved the simulation verification. The patient's treatment will proceed as planned.  -----------------------------------  Billie Lade, PhD, MD

## 2012-08-03 ENCOUNTER — Ambulatory Visit
Admission: RE | Admit: 2012-08-03 | Discharge: 2012-08-03 | Disposition: A | Discharge status: Home / Self Care | Payer: 59 | Source: Ambulatory Visit | Attending: Radiation Oncology | Admitting: Radiation Oncology

## 2012-08-04 ENCOUNTER — Ambulatory Visit: Payer: 59

## 2012-08-05 ENCOUNTER — Ambulatory Visit
Admission: RE | Admit: 2012-08-05 | Discharge: 2012-08-05 | Disposition: A | Discharge status: Home / Self Care | Payer: 59 | Source: Ambulatory Visit | Attending: Radiation Oncology | Admitting: Radiation Oncology

## 2012-08-05 ENCOUNTER — Ambulatory Visit: Payer: 59

## 2012-08-06 NOTE — Progress Notes (Signed)
Reviewed and agree.

## 2012-08-08 ENCOUNTER — Ambulatory Visit (INDEPENDENT_AMBULATORY_CARE_PROVIDER_SITE_OTHER): Payer: 59 | Admitting: Family Medicine

## 2012-08-08 ENCOUNTER — Ambulatory Visit
Admission: RE | Admit: 2012-08-08 | Discharge: 2012-08-08 | Disposition: A | Discharge status: Home / Self Care | Payer: 59 | Source: Ambulatory Visit | Attending: Radiation Oncology | Admitting: Radiation Oncology

## 2012-08-08 ENCOUNTER — Encounter: Payer: Self-pay | Admitting: Family Medicine

## 2012-08-08 VITALS — BP 124/70 | HR 68 | Temp 98.7°F | Resp 16 | Ht 63.5 in | Wt 149.0 lb

## 2012-08-08 DIAGNOSIS — E119 Type 2 diabetes mellitus without complications: Secondary | ICD-10-CM | POA: Insufficient documentation

## 2012-08-08 DIAGNOSIS — F32A Depression, unspecified: Secondary | ICD-10-CM | POA: Insufficient documentation

## 2012-08-08 DIAGNOSIS — F419 Anxiety disorder, unspecified: Secondary | ICD-10-CM

## 2012-08-08 DIAGNOSIS — F329 Major depressive disorder, single episode, unspecified: Secondary | ICD-10-CM | POA: Insufficient documentation

## 2012-08-08 DIAGNOSIS — Z0000 Encounter for general adult medical examination without abnormal findings: Secondary | ICD-10-CM

## 2012-08-08 DIAGNOSIS — E7800 Pure hypercholesterolemia, unspecified: Secondary | ICD-10-CM

## 2012-08-08 DIAGNOSIS — C50219 Malignant neoplasm of upper-inner quadrant of unspecified female breast: Secondary | ICD-10-CM

## 2012-08-08 DIAGNOSIS — F411 Generalized anxiety disorder: Secondary | ICD-10-CM

## 2012-08-08 LAB — COMPREHENSIVE METABOLIC PANEL
ALT: 27 U/L (ref 0–35)
Alkaline Phosphatase: 56 U/L (ref 39–117)
Creat: 0.63 mg/dL (ref 0.50–1.10)
Sodium: 139 mEq/L (ref 135–145)
Total Bilirubin: 0.4 mg/dL (ref 0.3–1.2)
Total Protein: 6.6 g/dL (ref 6.0–8.3)

## 2012-08-08 LAB — CBC WITH DIFFERENTIAL/PLATELET
Basophils Absolute: 0 10*3/uL (ref 0.0–0.1)
Basophils Relative: 1 % (ref 0–1)
Eosinophils Absolute: 0.2 10*3/uL (ref 0.0–0.7)
Eosinophils Relative: 4 % (ref 0–5)
MCH: 29 pg (ref 26.0–34.0)
MCHC: 33.4 g/dL (ref 30.0–36.0)
Neutrophils Relative %: 48 % (ref 43–77)
Platelets: 219 10*3/uL (ref 150–400)
RBC: 4.2 MIL/uL (ref 3.87–5.11)
RDW: 13 % (ref 11.5–15.5)

## 2012-08-08 LAB — POCT URINALYSIS DIPSTICK
Bilirubin, UA: NEGATIVE
Leukocytes, UA: NEGATIVE
Nitrite, UA: NEGATIVE
Protein, UA: NEGATIVE
Urobilinogen, UA: 0.2
pH, UA: 6.5

## 2012-08-08 LAB — LIPID PANEL
HDL: 74 mg/dL (ref >39)
LDL Cholesterol: 107 mg/dL — ABNORMAL HIGH (ref 0–99)
Total CHOL/HDL Ratio: 2.7 Ratio
Triglycerides: 80 mg/dL (ref <150)

## 2012-08-08 LAB — CK: Total CK: 115 U/L (ref 7–177)

## 2012-08-08 LAB — TSH: TSH: 2.527 u[IU]/mL (ref 0.350–4.500)

## 2012-08-08 MED ORDER — METFORMIN HCL ER (MOD) 500 MG PO TB24: 500.0000 mg | ORAL_TABLET | Freq: Every day | ORAL | Status: DC

## 2012-08-08 MED ORDER — DICLOFENAC SODIUM 1 % TD GEL: 2.0000 g | Freq: Four times a day (QID) | TRANSDERMAL | Status: DC

## 2012-08-08 MED ORDER — ESOMEPRAZOLE MAGNESIUM 40 MG PO CPDR: 40.0000 mg | DELAYED_RELEASE_CAPSULE | Freq: Two times a day (BID) | ORAL | Status: DC

## 2012-08-08 MED ORDER — ALPRAZOLAM 0.5 MG PO TABS: 0.5000 mg | ORAL_TABLET | Freq: Every evening | ORAL | Status: DC | PRN

## 2012-08-08 MED ORDER — ALBUTEROL SULFATE HFA 108 (90 BASE) MCG/ACT IN AERS: 2.0000 | INHALATION_SPRAY | Freq: Four times a day (QID) | RESPIRATORY_TRACT | Status: DC | PRN

## 2012-08-08 MED ORDER — FROVATRIPTAN SUCCINATE 2.5 MG PO TABS: 2.5000 mg | ORAL_TABLET | ORAL | Status: DC | PRN

## 2012-08-08 MED ORDER — PAROXETINE HCL 40 MG PO TABS: 40.0000 mg | ORAL_TABLET | Freq: Every day | ORAL | Status: DC

## 2012-08-08 MED ORDER — LISINOPRIL 5 MG PO TABS: 5.0000 mg | ORAL_TABLET | Freq: Every day | ORAL | Status: DC

## 2012-08-08 NOTE — Assessment & Plan Note (Signed)
New.  S/p lumpectomy with node resection.  Initiated radiation last week.  Coping well with diagnosis.

## 2012-08-08 NOTE — Assessment & Plan Note (Signed)
Controlled; obtain labs. Refill provided.

## 2012-08-08 NOTE — Patient Instructions (Addendum)
Annual physical exam - Plan: Microalbumin, urine, POCT urinalysis dipstick, Vitamin D 25 hydroxy, CBC with Differential, Comprehensive metabolic panel, Hemoglobin A1c, Lipid panel, TSH, Vitamin B12, Folate, EKG 12-Lead, CK  Cancer of upper-inner quadrant of female breast

## 2012-08-08 NOTE — Assessment & Plan Note (Signed)
Controlled; obtain urine microalbumin, labs.  Monofilament intact.  Appointment this month with ophthalmology.  Refill of medication provided.

## 2012-08-08 NOTE — Progress Notes (Signed)
[address]   [place], [place]  [address]   [phone]  Subjective:    Patient ID: Alisha Matthews, female    DOB: Dec 28, 1950, 62 y.o.   MRN: [id number]  HPI This 62 y.o. female presents for evaluation for CPE.  Last physical  Pap smear 2013; scheduled to see Farrel Gobble of gyn this month. Mammogram 04/2012. Colonoscopy 2013. Bone density scan 2013. TDAP unknown 6-7 years. Pneumovax s/p 2 in past. Hepatitis B series in past. Zostavax never and not interested. Influenza 2013. Eye exam scheduled 08-19-12 optometrist [facility].  +glasses.  No g/c. Dental exam every six months; due in March 2014.   Breast Cancer:  Undergoing radiation daily M-F for 33 treatments. Completed three treatments of radiation.    Receiving one minute of radiation at 8:00am.  Recommending Tamoxifen but not sure if going to take it.  Appointment with [facility] is 10-06-12.  Has gyn appointment with [facility] on 08-18-12; has stopped vaginal suppository since breast cancer diagnosis.  Diet drastically changed.  Eating much healthier.  To return to work next but has not completely decided to return to work; multiple stressors related to work.  Emotionally doing very well with cancer diagnosis, treatment.  Excellent family support and friend support.  Sleeping well with Benadryl alternating with Xanax.     Review of Systems  Constitutional: Negative for fever, chills, diaphoresis, activity change, appetite change, fatigue and unexpected weight change.  HENT: Negative for hearing loss, ear pain, nosebleeds, congestion, sore throat, facial swelling, rhinorrhea, sneezing, drooling, mouth sores, trouble swallowing, neck pain, neck stiffness, dental problem, voice change, postnasal drip, sinus pressure, tinnitus and ear discharge.   Eyes: Negative for photophobia, pain, discharge, redness, itching and visual disturbance.  Respiratory: Positive for cough. Negative for apnea, choking, chest tightness, shortness of breath, wheezing and  stridor.   Cardiovascular: Negative for chest pain, palpitations and leg swelling.  Gastrointestinal: Negative for nausea, vomiting, abdominal pain, diarrhea, constipation, blood in stool, abdominal distention, anal bleeding and rectal pain.  Endocrine: Negative for cold intolerance, heat intolerance, polydipsia, polyphagia and polyuria.  Genitourinary: Negative for dysuria, urgency, frequency, hematuria, flank pain, decreased urine volume, vaginal bleeding, vaginal discharge, enuresis, difficulty urinating, genital sores, menstrual problem, pelvic pain and dyspareunia.  Musculoskeletal: Positive for arthralgias and gait problem. Negative for myalgias, back pain and joint swelling.  Skin: Negative for color change, pallor, rash and wound.  Allergic/Immunologic: Negative for environmental allergies, food allergies and immunocompromised state.  Neurological: Negative for dizziness, tremors, seizures, syncope, facial asymmetry, speech difficulty, weakness, light-headedness, numbness and headaches.  Hematological: Negative for adenopathy. Does not bruise/bleed easily.  Psychiatric/Behavioral: Positive for sleep disturbance and dysphoric mood. Negative for suicidal ideas, hallucinations, behavioral problems, confusion, self-injury, decreased concentration and agitation. The patient is nervous/anxious. The patient is not hyperactive.         Past Medical History  Diagnosis Date  . Anxiety   . Arthritis   . Diabetes mellitus     borderline  . GERD (gastroesophageal reflux disease)   . Hyperlipidemia   . Insomnia, unspecified   . Leukocytopenia, unspecified   . Barrett's esophagus   . Breast fibrocystic disorder   . Genital warts   . Osteoarthrosis, unspecified whether generalized or localized, ankle and foot     ankle/foot  . Exercise induced bronchospasm   . Personal history of colonic polyps   . Migraine, unspecified, without mention of intractable migraine without mention of status  migrainosus   . Symptomatic menopausal or female climacteric  states   . Obesity, unspecified   . Essential hypertension, benign   . Depressive disorder, not elsewhere classified   . Anemia, unspecified   . Chicken pox   . Measles   . Mumps   . Breast cancer     right breast invasive ductal carcinoma     Past Surgical History  Procedure Laterality Date  . Cholecystectomy    . Bunionectomy      3 surgeries on both feet  . Nasal septum surgery    . Total hip arthroplasty      left      Perthes disease  . Foot surgery      Left-revision  . Dilation and curettage of uterus  1978  . Breast lumpectomy with needle localization and axillary sentinel lymph node bx  06/08/2012    Procedure: BREAST LUMPECTOMY WITH NEEDLE LOCALIZATION AND AXILLARY SENTINEL LYMPH NODE BX;  Surgeon: Emelia Loron, MD;  Location: [facility];  Service: General;  Laterality: Right;  . Re-excision of breast cancer,superior margins  06/27/2012    Procedure: RE-EXCISION OF BREAST CANCER,SUPERIOR MARGINS;  Surgeon: Emelia Loron, MD;  Location: [facility];  Service: General;  Laterality: Right;    Prior to Admission medications   Medication Sig Start Date End Date Taking? Authorizing Provider  albuterol (PROVENTIL HFA;VENTOLIN HFA) 108 (90 BASE) MCG/ACT inhaler Inhale 2 puffs into the lungs every 6 (six) hours as needed. Asthmatic attack   Yes Historical Provider, MD  ALPRAZolam Prudy Feeler) 0.5 MG tablet Take 0.5 mg by mouth at bedtime as needed.   Yes Historical Provider, MD  diclofenac sodium (VOLTAREN) 1 % GEL Apply 1 application topically as needed. Joint pain ankle   Yes Historical Provider, MD  Diclofenac-Misoprostol (ARTHROTEC PO) Take 1 tablet by mouth 3 (three) times daily.   Yes Historical Provider, MD  diphenhydrAMINE (BENADRYL) 25 MG tablet Take 25 mg by mouth at bedtime as needed. When not taking xanax.   Yes Historical Provider, MD  esomeprazole (NEXIUM) 40 MG capsule Take  40 mg by mouth 2 (two) times daily.   Yes Historical Provider, MD  frovatriptan (FROVA) 2.5 MG tablet Take 2.5 mg by mouth as needed. If recurs, may repeat after 2 hours. Max of 3 tabs in 24 hours.   Yes Historical Provider, MD  lisinopril (PRINIVIL,ZESTRIL) 5 MG tablet Take 5 mg by mouth daily.   Yes Historical Provider, MD  metFORMIN (GLUMETZA) 500 MG (MOD) 24 hr tablet Take 500 mg by mouth daily with breakfast.   Yes Historical Provider, MD  PARoxetine (PAXIL) 40 MG tablet Take 40 mg by mouth daily.   Yes Historical Provider, MD  clindamycin (CLEOCIN) 150 MG capsule Take 150 mg by mouth as needed. Takes 4 as needed prior to dental appt    Historical Provider, MD  estradiol (ESTRACE) 0.1 MG/GM vaginal cream Place 2 g vaginally once a week.    Historical Provider, MD  oxyCODONE-acetaminophen (PERCOCET) 10-325 MG per tablet Take 1 tablet by mouth every 6 (six) hours as needed for pain. 06/27/12 06/27/13  Emelia Loron, MD  oxyCODONE-acetaminophen (ROXICET) 5-325 MG per tablet Take 1 tablet by mouth every 4 (four) hours as needed for pain. 06/08/12   Emelia Loron, MD    Allergies  Allergen Reactions  . Penicillins Rash    History   Social History  . Marital Status: Married    Spouse Name: N/A    Number of Children: 0  . Years of Education: college  Occupational History  . medical assistant [employer]   Social History Main Topics  . Smoking status: Never Smoker   . Smokeless tobacco: Never Used  . Alcohol Use: No  . Drug Use: No  . Sexually Active: Not on file   Other Topics Concern  . Not on file   Social History Narrative   Married x 16 years happily, second marriage. No domestic abuse; 6 cats; no children. Moderate caffeine use. Exercise plan: gardening and walking.    Family History  Problem Relation Age of Onset  . Hypertension Mother   . Diabetes Mother   . Heart failure Mother   . Heart failure Father   . Depression Brother   . Cancer Maternal Grandmother  45    non hodgkins lymphoma    Objective:   Physical Exam  Nursing note and vitals reviewed. Constitutional: She is oriented to person, place, and time. She appears well-developed and well-nourished. No distress.  HENT:  Head: Normocephalic and atraumatic.  Right Ear: External ear normal.  Left Ear: External ear normal.  Nose: Nose normal.  Mouth/Throat: Oropharynx is clear and moist.  Eyes: Conjunctivae and EOM are normal. Pupils are equal, round, and reactive to light.  Neck: Normal range of motion. Neck supple. No JVD present. No thyromegaly present.  Cardiovascular: Normal rate, regular rhythm, normal heart sounds and intact distal pulses.  Exam reveals no gallop and no friction rub.   No murmur heard. Pulmonary/Chest: Effort normal and breath sounds normal. No respiratory distress. She has no wheezes. She has no rales.  Abdominal: Soft. Bowel sounds are normal. She exhibits no distension and no mass. There is no tenderness. There is no rebound and no guarding.  Musculoskeletal:       Right shoulder: Normal.       Left shoulder: Normal.       Cervical back: Normal.  Lymphadenopathy:    She has no cervical adenopathy.  Neurological: She is alert and oriented to person, place, and time. She has normal reflexes. No sensory deficit. She exhibits normal muscle tone. Coordination normal.  Monofilament intact BLE.  Skin: Skin is warm and dry. No rash noted. She is not diaphoretic. No erythema. No pallor.  Psychiatric: She has a normal mood and affect. Her behavior is normal. Judgment and thought content normal.   EKG:  NSR.     Assessment & Plan:  Annual physical exam - Plan: Microalbumin, urine, POCT urinalysis dipstick, Vitamin D 25 hydroxy, CBC with Differential, Comprehensive metabolic panel, Hemoglobin A1c, Lipid panel, TSH, Vitamin B12, Folate, EKG 12-Lead, CK  Cancer of upper-inner quadrant of female breast

## 2012-08-08 NOTE — Assessment & Plan Note (Signed)
Stable despite recent diagnosis of breast cancer with current treatment.  Coping well. Counseled face to face for 30 minutes with 50% of time committed to counseling and coordination of care.  Refills of medication provided.

## 2012-08-08 NOTE — Assessment & Plan Note (Signed)
Anticipatory guidance provided --- exercise, weight maintenance.  Pap smear and mammogram UTD.  Appointment this month with local gyn.  Colonoscopy UTD.  Immunizations UTD other than Zostavax and declined.  Obtain labs.

## 2012-08-09 ENCOUNTER — Encounter: Payer: Self-pay | Admitting: Radiation Oncology

## 2012-08-09 ENCOUNTER — Ambulatory Visit
Admission: RE | Admit: 2012-08-09 | Discharge: 2012-08-09 | Disposition: A | Discharge status: Home / Self Care | Payer: 59 | Source: Ambulatory Visit | Attending: Radiation Oncology | Admitting: Radiation Oncology

## 2012-08-09 VITALS — BP 112/51 | HR 79 | Temp 98.2°F | Resp 20 | Wt 155.0 lb

## 2012-08-09 DIAGNOSIS — C50219 Malignant neoplasm of upper-inner quadrant of unspecified female breast: Secondary | ICD-10-CM

## 2012-08-09 LAB — MICROALBUMIN, URINE: Microalb, Ur: 0.5 mg/dL (ref 0.00–1.89)

## 2012-08-09 MED ORDER — ALRA NON-METALLIC DEODORANT (RAD-ONC)
1.0000 "application " | Freq: Once | TOPICAL | Status: AC
  Administered 2012-08-09: 1 via TOPICAL

## 2012-08-09 MED ORDER — RADIAPLEXRX EX GEL
Freq: Once | CUTANEOUS | Status: AC
Start: 2012-08-09 — End: 2012-08-09
  Administered 2012-08-09: 10:00:00 via TOPICAL

## 2012-08-09 NOTE — Progress Notes (Signed)
[facility]    Radiation Oncology [address] [place]     Maryln Gottron, M.D. [place], [place] [address]               Billie Lade, M.D., Ph.D. Phone: [phone]      Molli Hazard A. Kathrynn Running, M.D. Fax: [phone]      Radene Gunning, M.D., Ph.D.         Lurline Hare, M.D.         Grayland Jack, M.D Weekly Treatment Management Note  Name: Alisha Matthews     MRN: [id number]        CSN: [id number] Date: 08/09/2012      DOB: 1950/12/22  CC: Nilda Simmer, MD         Katrinka Blazing    Status: Outpatient  Diagnosis: The encounter diagnosis was Cancer of upper-inner quadrant of female breast.  Current Dose: 7.2 Gy  Current Fraction: 4  Planned Dose: 60.4 Gy  Narrative: Alisha Matthews was seen today for weekly treatment management. The chart was checked and port films  were reviewed. She is tolerating her radiation therapy well. She has noticed some mild sensitivity to the right breast. She has had some mild fatigue.  Penicillins  Current Outpatient Prescriptions  Medication Sig Dispense Refill  . albuterol (PROVENTIL HFA;VENTOLIN HFA) 108 (90 BASE) MCG/ACT inhaler Inhale 2 puffs into the lungs every 6 (six) hours as needed. Asthmatic attack  18 g  3  . ALPRAZolam (XANAX) 0.5 MG tablet Take 1 tablet (0.5 mg total) by mouth at bedtime as needed.  30 tablet  5  . clindamycin (CLEOCIN) 150 MG capsule Take 150 mg by mouth as needed. Takes 4 as needed prior to dental appt      . diclofenac sodium (VOLTAREN) 1 % GEL Apply 2 g topically 4 (four) times daily. Joint pain ankle  100 g  11  . Diclofenac-Misoprostol (ARTHROTEC PO) Take 1 tablet by mouth 3 (three) times daily.      . diphenhydrAMINE (BENADRYL) 25 MG tablet Take 25 mg by mouth at bedtime as needed. When not taking xanax.      . esomeprazole (NEXIUM) 40 MG capsule Take 1 capsule (40 mg total) by mouth 2 (two) times daily.  180 capsule  3  . estradiol (ESTRACE) 0.1 MG/GM vaginal cream Place 2 g vaginally once a week.      .  frovatriptan (FROVA) 2.5 MG tablet Take 1 tablet (2.5 mg total) by mouth as needed. If recurs, may repeat after 2 hours. Max of 3 tabs in 24 hours.  10 tablet  2  . lisinopril (PRINIVIL,ZESTRIL) 5 MG tablet Take 1 tablet (5 mg total) by mouth daily.  90 tablet  3  . metFORMIN (GLUMETZA) 500 MG (MOD) 24 hr tablet Take 1 tablet (500 mg total) by mouth daily with breakfast.  90 tablet  3  . PARoxetine (PAXIL) 40 MG tablet Take 1 tablet (40 mg total) by mouth daily.  90 tablet  3  . hyaluronate sodium (RADIAPLEXRX) GEL Apply topically 2 (two) times daily.      . non-metallic deodorant Thornton Papas) MISC Apply 1 application topically daily as needed.       No current facility-administered medications for this encounter.   Labs:  Lab Results  Component Value Date   WBC 3.6* 08/08/2012   HGB 12.2 08/08/2012   HCT 36.5 08/08/2012   MCV 86.9 08/08/2012   PLT 219 08/08/2012   Lab Results  Component  Value Date   CREATININE 0.63 08/08/2012   BUN 8 08/08/2012   NA 139 08/08/2012   K 3.8 08/08/2012   CL 104 08/08/2012   CO2 28 08/08/2012   Lab Results  Component Value Date   ALT 27 08/08/2012   AST 27 08/08/2012   BILITOT 0.4 08/08/2012    Physical Examination:  weight is 155 lb (70.308 kg). Her oral temperature is 98.2 F (36.8 C). Her blood pressure is 112/51 and her pulse is 79. Her respiration is 20.    Wt Readings from Last 3 Encounters:  08/09/12 155 lb (70.308 kg)  08/08/12 149 lb (67.586 kg)  07/20/12 150 lb (68.04 kg)    The right breast area shows no significant radiation reaction at this time  Lungs - Normal respiratory effort, chest expands symmetrically. Lungs are clear to auscultation, no crackles or wheezes.  Heart has regular rhythm and rate  Abdomen is soft and non tender with normal bowel sounds  Assessment:  Patient tolerating treatments well  Plan: Continue treatment per original radiation prescription

## 2012-08-09 NOTE — Progress Notes (Addendum)
Pt denies pain, loss of appetite. She does report fatigue and some slight tingling in her right breast. Post sim ed completed; charted under pt education appt.

## 2012-08-09 NOTE — Progress Notes (Signed)
Post sim ed completed w/pt. Gave pt "Radiation and You" booklet w/all pertinent information marked and discussed, re: fatigue, skin irritation/care, pain, nutrition. Gave pt Radiaplex, [place] w/instructions for proper use. Pt verbalized understanding.

## 2012-08-10 ENCOUNTER — Telehealth: Payer: Self-pay | Admitting: Radiation Oncology

## 2012-08-10 ENCOUNTER — Ambulatory Visit
Admission: RE | Admit: 2012-08-10 | Discharge: 2012-08-10 | Disposition: A | Discharge status: Home / Self Care | Payer: 59 | Source: Ambulatory Visit | Attending: Radiation Oncology | Admitting: Radiation Oncology

## 2012-08-10 NOTE — Telephone Encounter (Signed)
Letter for leave of absence to machine 08/10/12.

## 2012-08-10 NOTE — Telephone Encounter (Signed)
Phoned patient to inquire if she was able to complete all necessary short term disability paperwork yesterday. Patient confirms that she did. Routed this message to Dr. Roselind Messier.

## 2012-08-11 ENCOUNTER — Ambulatory Visit
Admission: RE | Admit: 2012-08-11 | Discharge: 2012-08-11 | Disposition: A | Discharge status: Home / Self Care | Payer: 59 | Source: Ambulatory Visit | Attending: Radiation Oncology | Admitting: Radiation Oncology

## 2012-08-12 ENCOUNTER — Ambulatory Visit
Admission: RE | Admit: 2012-08-12 | Discharge: 2012-08-12 | Disposition: A | Discharge status: Home / Self Care | Payer: 59 | Source: Ambulatory Visit | Attending: Radiation Oncology | Admitting: Radiation Oncology

## 2012-08-13 ENCOUNTER — Encounter: Payer: Self-pay | Admitting: *Deleted

## 2012-08-15 ENCOUNTER — Ambulatory Visit
Admission: RE | Admit: 2012-08-15 | Discharge: 2012-08-15 | Disposition: A | Discharge status: Home / Self Care | Payer: 59 | Source: Ambulatory Visit | Attending: Radiation Oncology | Admitting: Radiation Oncology

## 2012-08-16 ENCOUNTER — Ambulatory Visit
Admission: RE | Admit: 2012-08-16 | Discharge: 2012-08-16 | Disposition: A | Discharge status: Home / Self Care | Payer: 59 | Source: Ambulatory Visit | Attending: Radiation Oncology | Admitting: Radiation Oncology

## 2012-08-16 ENCOUNTER — Encounter: Payer: Self-pay | Admitting: Radiation Oncology

## 2012-08-16 VITALS — BP 106/72 | HR 66 | Temp 98.8°F | Resp 20 | Wt 152.1 lb

## 2012-08-16 DIAGNOSIS — C50211 Malignant neoplasm of upper-inner quadrant of right female breast: Secondary | ICD-10-CM

## 2012-08-16 MED ORDER — RADIAPLEXRX EX GEL
Freq: Once | CUTANEOUS | Status: AC
  Administered 2012-08-16: 10:00:00 via TOPICAL

## 2012-08-16 NOTE — Addendum Note (Signed)
Encounter addended by: Glennie Hawk, RN on: 08/16/2012 10:09 AM<BR>     Documentation filed: Inpatient MAR, Orders

## 2012-08-16 NOTE — Progress Notes (Signed)
[facility]    Radiation Oncology [address] [place]     Maryln Gottron, M.D. [place], [place] [address]               Billie Lade, M.D., Ph.D. Phone: [phone]      Molli Hazard A. Kathrynn Running, M.D. Fax: [phone]      Radene Gunning, M.D., Ph.D.         Lurline Hare, M.D.         Grayland Jack, M.D Weekly Treatment Management Note  Name: Alisha Matthews     MRN: [id number]        CSN: [id number] Date: 08/16/2012      DOB: [birth year]  CC: Nilda Simmer, MD         Katrinka Blazing    Status: Outpatient  Diagnosis: The encounter diagnosis was Cancer of upper-inner quadrant of female breast, right.  Current Dose: 16.2 Gy  Current Fraction: 9  Planned Dose: 60.4 Gy  Narrative: Erskine Squibb E Cerra was seen today for weekly treatment management. The chart was checked and port films  were reviewed. She is tolerating the treatments well at this time. She has noticed some slight fatigue and. She denies any significant itching or discomfort in the breast area.  Penicillins  Current Outpatient Prescriptions  Medication Sig Dispense Refill  . albuterol (PROVENTIL HFA;VENTOLIN HFA) 108 (90 BASE) MCG/ACT inhaler Inhale 2 puffs into the lungs every 6 (six) hours as needed. Asthmatic attack  18 g  3  . ALPRAZolam (XANAX) 0.5 MG tablet Take 1 tablet (0.5 mg total) by mouth at bedtime as needed.  30 tablet  5  . clindamycin (CLEOCIN) 150 MG capsule Take 150 mg by mouth as needed. Takes 4 as needed prior to dental appt      . diclofenac sodium (VOLTAREN) 1 % GEL Apply 2 g topically 4 (four) times daily. Joint pain ankle  100 g  11  . Diclofenac-Misoprostol (ARTHROTEC PO) Take 1 tablet by mouth 3 (three) times daily.      . diphenhydrAMINE (BENADRYL) 25 MG tablet Take 25 mg by mouth at bedtime as needed. When not taking xanax.      . esomeprazole (NEXIUM) 40 MG capsule Take 1 capsule (40 mg total) by mouth 2 (two) times daily.  180 capsule  3  . estradiol (ESTRACE) 0.1 MG/GM vaginal cream Place 2 g  vaginally once a week.      . frovatriptan (FROVA) 2.5 MG tablet Take 1 tablet (2.5 mg total) by mouth as needed. If recurs, may repeat after 2 hours. Max of 3 tabs in 24 hours.  10 tablet  2  . lisinopril (PRINIVIL,ZESTRIL) 5 MG tablet Take 1 tablet (5 mg total) by mouth daily.  90 tablet  3  . metFORMIN (GLUMETZA) 500 MG (MOD) 24 hr tablet Take 1 tablet (500 mg total) by mouth daily with breakfast.  90 tablet  3  . PARoxetine (PAXIL) 40 MG tablet Take 1 tablet (40 mg total) by mouth daily.  90 tablet  3  . hyaluronate sodium (RADIAPLEXRX) GEL Apply topically 2 (two) times daily.      . non-metallic deodorant Thornton Papas) MISC Apply 1 application topically daily as needed.       No current facility-administered medications for this encounter.   Labs:  Lab Results  Component Value Date   WBC 3.6* 08/08/2012   HGB 12.2 08/08/2012   HCT 36.5 08/08/2012   MCV 86.9 08/08/2012   PLT 219 08/08/2012  Lab Results  Component Value Date   CREATININE 0.63 08/08/2012   BUN 8 08/08/2012   NA 139 08/08/2012   K 3.8 08/08/2012   CL 104 08/08/2012   CO2 28 08/08/2012   Lab Results  Component Value Date   ALT 27 08/08/2012   AST 27 08/08/2012   BILITOT 0.4 08/08/2012    Physical Examination:  weight is 152 lb 1.6 oz (68.992 kg). Her oral temperature is 98.8 F (37.1 C). Her blood pressure is 106/72 and her pulse is 66. Her respiration is 20.    Wt Readings from Last 3 Encounters:  08/16/12 152 lb 1.6 oz (68.992 kg)  08/09/12 155 lb (70.308 kg)  08/08/12 149 lb (67.586 kg)    The right breast area shows mild erythema in the upper inner aspect. There's no signs of infection in the breast area. Lungs - Normal respiratory effort, chest expands symmetrically. Lungs are clear to auscultation, no crackles or wheezes.  Heart has regular rhythm and rate  Abdomen is soft and non tender with normal bowel sounds  Assessment:  Patient tolerating treatments well  Plan: Continue treatment per original radiation  prescription

## 2012-08-16 NOTE — Progress Notes (Signed)
Pt denies pain, fatigue, loss of appetite. Applying Radiaplex to right breast tx area.

## 2012-08-17 ENCOUNTER — Ambulatory Visit
Admission: RE | Admit: 2012-08-17 | Discharge: 2012-08-17 | Disposition: A | Discharge status: Home / Self Care | Payer: 59 | Source: Ambulatory Visit | Attending: Radiation Oncology | Admitting: Radiation Oncology

## 2012-08-18 ENCOUNTER — Ambulatory Visit
Admission: RE | Admit: 2012-08-18 | Discharge: 2012-08-18 | Disposition: A | Discharge status: Home / Self Care | Payer: 59 | Source: Ambulatory Visit | Attending: Radiation Oncology | Admitting: Radiation Oncology

## 2012-08-19 ENCOUNTER — Ambulatory Visit
Admission: RE | Admit: 2012-08-19 | Discharge: 2012-08-19 | Disposition: A | Discharge status: Home / Self Care | Payer: 59 | Source: Ambulatory Visit | Attending: Radiation Oncology | Admitting: Radiation Oncology

## 2012-08-22 ENCOUNTER — Ambulatory Visit
Admission: RE | Admit: 2012-08-22 | Discharge: 2012-08-22 | Disposition: A | Discharge status: Home / Self Care | Payer: 59 | Source: Ambulatory Visit | Attending: Radiation Oncology | Admitting: Radiation Oncology

## 2012-08-23 ENCOUNTER — Ambulatory Visit
Admission: RE | Admit: 2012-08-23 | Discharge: 2012-08-23 | Disposition: A | Discharge status: Home / Self Care | Payer: 59 | Source: Ambulatory Visit | Attending: Radiation Oncology | Admitting: Radiation Oncology

## 2012-08-23 ENCOUNTER — Encounter: Payer: Self-pay | Admitting: Radiation Oncology

## 2012-08-23 VITALS — BP 108/63 | HR 75 | Resp 18 | Wt 156.4 lb

## 2012-08-23 NOTE — Progress Notes (Signed)
Patient presents to the clinic today unaccompanied for PUT with Dr. Roselind Messier. Patient is alert and oriented to person, place and time. No distress noted. Steady gait noted. Pleasant affect noted. Patient denies pain at this time. Patient reports that her right breast occasional achy. No skin changes noted to right breast. Patient reports using radiaplex bid as directed on her right breast. Patient reports that over the weekend she slept for 36 hours. Patient reports she was exhausted. Patient reports that she is feeling much better this week after that weekend of rest. Reported all findings to Dr. Roselind Messier.

## 2012-08-23 NOTE — Progress Notes (Signed)
  Radiation Oncology         [phone] ________________________________  Name: Alisha Matthews MRN: [id number]  Date: 08/23/2012  DOB: 10/29/1950  Weekly Radiation Therapy Management  Current Dose: 25.2 Gy     Planned Dose:  60.4 Gy  Narrative . . . . . . . . The patient presents for routine under treatment assessment.                                                     The patient is without complaint. She had a lot of fatigue last weekend but is doing better at this time. She denies any itching or discomfort in the breast.                                 Set-up films were reviewed.                                 The chart was checked. Physical Findings. . .  weight is 156 lb 6.4 oz (70.943 kg). Her blood pressure is 108/63 and her pulse is 75. Her respiration is 18. . Weight essentially stable.  No significant changes.  The right breast area shows mild erythema. Impression . . . . . . . The patient is  tolerating radiation. Plan . . . . . . . . . . . . Continue treatment as planned.  ________________________________   Billie Lade, PhD, MD

## 2012-08-24 ENCOUNTER — Ambulatory Visit
Admission: RE | Admit: 2012-08-24 | Discharge: 2012-08-24 | Disposition: A | Discharge status: Home / Self Care | Payer: 59 | Source: Ambulatory Visit | Attending: Radiation Oncology | Admitting: Radiation Oncology

## 2012-08-25 ENCOUNTER — Ambulatory Visit
Admission: RE | Admit: 2012-08-25 | Discharge: 2012-08-25 | Disposition: A | Discharge status: Home / Self Care | Payer: 59 | Source: Ambulatory Visit | Attending: Radiation Oncology | Admitting: Radiation Oncology

## 2012-08-25 ENCOUNTER — Telehealth: Payer: Self-pay | Admitting: Radiation Oncology

## 2012-08-25 NOTE — Telephone Encounter (Signed)
Returned patient's call from 1118. Patient reports mild episode of nausea earlier. Patient reports that she had not eaten and wondered if it was associated with that. She reports eating a small amount of food and that she is now feeling a little better. Requested patient contact staff if nausea persist. Reassured her the nausea was unrelated to radiation to her breast. Routed message to Dr. Roselind Messier.

## 2012-08-26 ENCOUNTER — Ambulatory Visit
Admission: RE | Admit: 2012-08-26 | Discharge: 2012-08-26 | Disposition: A | Discharge status: Home / Self Care | Payer: 59 | Source: Ambulatory Visit | Attending: Radiation Oncology | Admitting: Radiation Oncology

## 2012-08-29 ENCOUNTER — Ambulatory Visit
Admission: RE | Admit: 2012-08-29 | Discharge: 2012-08-29 | Disposition: A | Discharge status: Home / Self Care | Payer: 59 | Source: Ambulatory Visit | Attending: Radiation Oncology | Admitting: Radiation Oncology

## 2012-08-30 ENCOUNTER — Telehealth: Payer: Self-pay | Admitting: Radiation Oncology

## 2012-08-30 ENCOUNTER — Ambulatory Visit
Admission: RE | Admit: 2012-08-30 | Discharge: 2012-08-30 | Disposition: A | Discharge status: Home / Self Care | Payer: 59 | Source: Ambulatory Visit | Attending: Radiation Oncology | Admitting: Radiation Oncology

## 2012-08-30 VITALS — BP 114/54 | HR 70 | Temp 98.7°F | Wt 154.5 lb

## 2012-08-30 NOTE — Progress Notes (Signed)
[facility]    Radiation Oncology [address] [place]     Maryln Gottron, M.D. [place], [place] [address]               Billie Lade, M.D., Ph.D. Phone: [phone]      Molli Hazard A. Kathrynn Running, M.D. Fax: [phone]      Radene Gunning, M.D., Ph.D.         Lurline Hare, M.D.         Grayland Jack, M.D Weekly Treatment Management Note  Name: Alisha Matthews     MRN: [id number]        CSN: [id number] Date: 08/30/2012      DOB: 10-07-1950  CC: Nilda Simmer, MD         Katrinka Blazing    Status: Outpatient  Diagnosis: The encounter diagnosis was Cancer of upper-inner quadrant of female breast, right.  Current Dose: 34.2 Gy  Current Fraction: 19  Planned Dose: 60.4 Gy  Narrative: Alisha Matthews was seen today for weekly treatment management. The chart was checked and port films  were reviewed. She is starting to have itching and discomfort in the breast area. She also has significant fatigue particularly later in the week.  She sleeps significantly over the weekend, 15 hrs per night .  In light of this she would not be able to work her usual schedule.  Penicillins-Allergy  Current Outpatient Prescriptions  Medication Sig Dispense Refill  . albuterol (PROVENTIL HFA;VENTOLIN HFA) 108 (90 BASE) MCG/ACT inhaler Inhale 2 puffs into the lungs every 6 (six) hours as needed. Asthmatic attack  18 g  3  . ALPRAZolam (XANAX) 0.5 MG tablet Take 1 tablet (0.5 mg total) by mouth at bedtime as needed.  30 tablet  5  . clindamycin (CLEOCIN) 150 MG capsule Take 150 mg by mouth as needed. Takes 4 as needed prior to dental appt      . diclofenac sodium (VOLTAREN) 1 % GEL Apply 2 g topically 4 (four) times daily. Joint pain ankle  100 g  11  . Diclofenac-Misoprostol (ARTHROTEC PO) Take 1 tablet by mouth 3 (three) times daily.      . diphenhydrAMINE (BENADRYL) 25 MG tablet Take 25 mg by mouth at bedtime as needed. When not taking xanax.      . esomeprazole (NEXIUM) 40 MG capsule Take 1 capsule (40 mg  total) by mouth 2 (two) times daily.  180 capsule  3  . estradiol (ESTRACE) 0.1 MG/GM vaginal cream Place 2 g vaginally once a week.      . frovatriptan (FROVA) 2.5 MG tablet Take 1 tablet (2.5 mg total) by mouth as needed. If recurs, may repeat after 2 hours. Max of 3 tabs in 24 hours.  10 tablet  2  . hyaluronate sodium (RADIAPLEXRX) GEL Apply topically 2 (two) times daily.      Marland Kitchen lisinopril (PRINIVIL,ZESTRIL) 5 MG tablet Take 1 tablet (5 mg total) by mouth daily.  90 tablet  3  . metFORMIN (GLUMETZA) 500 MG (MOD) 24 hr tablet Take 1 tablet (500 mg total) by mouth daily with breakfast.  90 tablet  3  . non-metallic deodorant (ALRA) MISC Apply 1 application topically daily as needed.      Marland Kitchen PARoxetine (PAXIL) 40 MG tablet Take 1 tablet (40 mg total) by mouth daily.  90 tablet  3   No current facility-administered medications for this encounter.     Physical Examination:  weight is 154 lb 8 oz (70.081  kg). Her temperature is 98.7 F (37.1 C). Her blood pressure is 114/54 and her pulse is 70.    Wt Readings from Last 3 Encounters:  08/30/12 154 lb 8 oz (70.081 kg)  08/23/12 156 lb 6.4 oz (70.943 kg)  08/16/12 152 lb 1.6 oz (68.992 kg)    The right breast area shows some erythema but no skin breakdown is appreciated. Lungs - Normal respiratory effort, chest expands symmetrically. Lungs are clear to auscultation, no crackles or wheezes.  Heart has regular rhythm and rate  Abdomen is soft and non tender with normal bowel sounds  Assessment:  Patient tolerating treatments reasonably well except for issues as above.  Plan: Continue treatment per original radiation prescription

## 2012-08-30 NOTE — Telephone Encounter (Signed)
UT note 08/30/12 faxed to [facility], [phone].  Received confirmation.

## 2012-08-30 NOTE — Progress Notes (Signed)
Patient here for routine weekly assessment of right breast radiation treatment.Completed 19 of 28 treatments.Mild discoloration with some tenderness.Increased fatigue relieved with rest.

## 2012-08-31 ENCOUNTER — Ambulatory Visit
Admission: RE | Admit: 2012-08-31 | Discharge: 2012-08-31 | Disposition: A | Discharge status: Home / Self Care | Payer: 59 | Source: Ambulatory Visit | Attending: Radiation Oncology | Admitting: Radiation Oncology

## 2012-09-01 ENCOUNTER — Ambulatory Visit
Admission: RE | Admit: 2012-09-01 | Discharge: 2012-09-01 | Disposition: A | Discharge status: Home / Self Care | Payer: 59 | Source: Ambulatory Visit | Attending: Radiation Oncology | Admitting: Radiation Oncology

## 2012-09-01 NOTE — Progress Notes (Signed)
   Department of Radiation Oncology  Phone:  [phone] Fax:        [phone]  Electron beam simulation note  Earlier today the patient underwent additional planning for radiation therapy directed at the right breast area. Patient's treatment planning CT scan was reviewed and she had set of a custom electron cutout field directed at the site of presentation in the upper inner aspect of the breast. Patient will be treated with 12 Me V. electrons prescribed to the 97% isodose line. Prescription will be 2 gray per day for 5 treatments. A special port plan is requested for treatment.  -----------------------------------  Billie Lade, PhD, MD

## 2012-09-02 ENCOUNTER — Ambulatory Visit
Admission: RE | Admit: 2012-09-02 | Discharge: 2012-09-02 | Disposition: A | Discharge status: Home / Self Care | Payer: 59 | Source: Ambulatory Visit | Attending: Radiation Oncology | Admitting: Radiation Oncology

## 2012-09-05 ENCOUNTER — Ambulatory Visit
Admission: RE | Admit: 2012-09-05 | Discharge: 2012-09-05 | Disposition: A | Discharge status: Home / Self Care | Payer: 59 | Source: Ambulatory Visit | Attending: Radiation Oncology | Admitting: Radiation Oncology

## 2012-09-06 ENCOUNTER — Ambulatory Visit
Admission: RE | Admit: 2012-09-06 | Discharge: 2012-09-06 | Disposition: A | Discharge status: Home / Self Care | Payer: 59 | Source: Ambulatory Visit | Attending: Radiation Oncology | Admitting: Radiation Oncology

## 2012-09-06 ENCOUNTER — Encounter: Payer: 59 | Admitting: Obstetrics and Gynecology

## 2012-09-06 VITALS — BP 118/56 | HR 67 | Temp 98.6°F | Wt 154.0 lb

## 2012-09-06 MED ORDER — RADIAPLEXRX EX GEL
Freq: Once | CUTANEOUS | Status: AC
  Administered 2012-09-06: 12:00:00 via TOPICAL

## 2012-09-06 NOTE — Progress Notes (Signed)
[facility]    Radiation Oncology [address] [place]     Maryln Gottron, M.D. [place], [place] [address]               Alisha Matthews, M.D., Ph.D. Phone: [phone]      Molli Hazard A. Kathrynn Running, M.D. Fax: [phone]      Alisha Matthews, M.D., Ph.D.         Alisha Matthews, M.D.         Alisha Matthews, M.D Weekly Treatment Management Note  Name: Alisha Matthews     MRN: [id number]        CSN: [id number] Date: 09/06/2012      DOB: [birth year]  CC: Nilda Simmer, MD         Katrinka Blazing    Status: Outpatient  Diagnosis: The encounter diagnosis was Cancer of upper-inner quadrant of female breast, right.  Current Dose: 43.2 Gy  Current Fraction: 24  Planned Dose: 60.4 Gy  Narrative: Alisha Matthews was seen today for weekly treatment management. The chart was checked and port films  were reviewed. She continues to tolerate her treatments well.  She does sleep a significant amount at night but is well rested during the daytime. She is able to carry on her usual activities. She denies any itching or discomfort in the breast area.  Penicillins  Current Outpatient Prescriptions  Medication Sig Dispense Refill  . albuterol (PROVENTIL HFA;VENTOLIN HFA) 108 (90 BASE) MCG/ACT inhaler Inhale 2 puffs into the lungs every 6 (six) hours as needed. Asthmatic attack  18 g  3  . ALPRAZolam (XANAX) 0.5 MG tablet Take 1 tablet (0.5 mg total) by mouth at bedtime as needed.  30 tablet  5  . clindamycin (CLEOCIN) 150 MG capsule Take 150 mg by mouth as needed. Takes 4 as needed prior to dental appt      . diclofenac sodium (VOLTAREN) 1 % GEL Apply 2 g topically 4 (four) times daily. Joint pain ankle  100 g  11  . Diclofenac-Misoprostol (ARTHROTEC PO) Take 1 tablet by mouth 3 (three) times daily.      . diphenhydrAMINE (BENADRYL) 25 MG tablet Take 25 mg by mouth at bedtime as needed. When not taking xanax.      . esomeprazole (NEXIUM) 40 MG capsule Take 1 capsule (40 mg total) by mouth 2 (two) times daily.   180 capsule  3  . estradiol (ESTRACE) 0.1 MG/GM vaginal cream Place 2 g vaginally once a week.      . frovatriptan (FROVA) 2.5 MG tablet Take 1 tablet (2.5 mg total) by mouth as needed. If recurs, may repeat after 2 hours. Max of 3 tabs in 24 hours.  10 tablet  2  . hyaluronate sodium (RADIAPLEXRX) GEL Apply topically 2 (two) times daily.      Marland Kitchen lisinopril (PRINIVIL,ZESTRIL) 5 MG tablet Take 1 tablet (5 mg total) by mouth daily.  90 tablet  3  . metFORMIN (GLUMETZA) 500 MG (MOD) 24 hr tablet Take 1 tablet (500 mg total) by mouth daily with breakfast.  90 tablet  3  . non-metallic deodorant (ALRA) MISC Apply 1 application topically daily as needed.      Marland Kitchen PARoxetine (PAXIL) 40 MG tablet Take 1 tablet (40 mg total) by mouth daily.  90 tablet  3   No current facility-administered medications for this encounter.   Labs:    Physical Examination:  weight is 154 lb (69.854 kg). Her temperature is 98.6 F (37  C). Her blood pressure is 118/56 and her pulse is 67. Her oxygen saturation is 100%.    Wt Readings from Last 3 Encounters:  09/06/12 154 lb (69.854 kg)  08/30/12 154 lb 8 oz (70.081 kg)  08/23/12 156 lb 6.4 oz (70.943 kg)    The right breast area shows some hyperpigmentation changes and mild erythema but really no significant reaction at this time. Lungs - Normal respiratory effort, chest expands symmetrically. Lungs are clear to auscultation, no crackles or wheezes.  Heart has regular rhythm and rate  Abdomen is soft and non tender with normal bowel sounds  Assessment:  Patient tolerating treatments well  Plan: Continue treatment per original radiation prescription

## 2012-09-06 NOTE — Progress Notes (Signed)
Patient here for weekly assessment of radiation to right breast.Completed 24 of 28 treatments.Skin looks good.Generalized fatigue.Sleeps up 12 to 16 hours.Does regular activities as usual but still sleeps a lot.

## 2012-09-07 ENCOUNTER — Ambulatory Visit
Admission: RE | Admit: 2012-09-07 | Discharge: 2012-09-07 | Disposition: A | Discharge status: Home / Self Care | Payer: 59 | Source: Ambulatory Visit | Attending: Radiation Oncology | Admitting: Radiation Oncology

## 2012-09-08 ENCOUNTER — Ambulatory Visit
Admission: RE | Admit: 2012-09-08 | Discharge: 2012-09-08 | Disposition: A | Discharge status: Home / Self Care | Payer: 59 | Source: Ambulatory Visit | Attending: Radiation Oncology | Admitting: Radiation Oncology

## 2012-09-09 ENCOUNTER — Ambulatory Visit
Admission: RE | Admit: 2012-09-09 | Discharge: 2012-09-09 | Disposition: A | Discharge status: Home / Self Care | Payer: 59 | Source: Ambulatory Visit | Attending: Radiation Oncology | Admitting: Radiation Oncology

## 2012-09-12 ENCOUNTER — Ambulatory Visit
Admission: RE | Admit: 2012-09-12 | Discharge: 2012-09-12 | Disposition: A | Discharge status: Home / Self Care | Payer: 59 | Source: Ambulatory Visit | Attending: Radiation Oncology | Admitting: Radiation Oncology

## 2012-09-13 ENCOUNTER — Ambulatory Visit
Admission: RE | Admit: 2012-09-13 | Discharge: 2012-09-13 | Disposition: A | Discharge status: Home / Self Care | Payer: 59 | Source: Ambulatory Visit | Attending: Radiation Oncology | Admitting: Radiation Oncology

## 2012-09-13 ENCOUNTER — Encounter: Payer: Self-pay | Admitting: Radiation Oncology

## 2012-09-13 VITALS — BP 110/74 | HR 71 | Temp 98.8°F | Resp 20 | Wt 154.0 lb

## 2012-09-13 DIAGNOSIS — C50211 Malignant neoplasm of upper-inner quadrant of right female breast: Secondary | ICD-10-CM

## 2012-09-13 NOTE — Progress Notes (Signed)
   Department of Radiation Oncology  Phone:  [phone] Fax:        [phone]  The patient's electron setup was checked on the linear accelerator treatment table today. This showed accurate set up compared to the patient's plan.

## 2012-09-13 NOTE — Progress Notes (Signed)
Patient here weekly rad tx right breast, on boost, 29/33 completed, erythema, skin intact, no c/o pain at present, alert,oriented x3, just fatigue ,:i sleep a lot' Not otherwise examined today. 9:39 AM

## 2012-09-13 NOTE — Progress Notes (Signed)
[facility]    Radiation Oncology [address] [place]     Maryln Gottron, M.D. [place], [place] [address]               Billie Lade, M.D., Ph.D. Phone: [phone]      Molli Hazard A. Kathrynn Running, M.D. Fax: [phone]      Radene Gunning, M.D., Ph.D.         Lurline Hare, M.D.         Grayland Jack, M.D Weekly Treatment Management Note  Name: Alisha Matthews     MRN: [id number]        CSN: [id number] Date: 09/13/2012      DOB: 08-16-1950  CC: Alisha Simmer, MD         Katrinka Blazing    Status: Outpatient  Diagnosis: The encounter diagnosis was Cancer of upper-inner quadrant of female breast, right.  Current Dose: 52.4 Gy  Current Fraction: 29  Planned Dose: 60.4 Gy  Narrative: Alisha Matthews was seen today for weekly treatment management. The chart was checked and port films  were reviewed. She continues to tolerate treatments well this time except for fatigue. She denies any itching or discomfort in the breast area.  Penicillins Current Outpatient Prescriptions  Medication Sig Dispense Refill  . albuterol (PROVENTIL HFA;VENTOLIN HFA) 108 (90 BASE) MCG/ACT inhaler Inhale 2 puffs into the lungs every 6 (six) hours as needed. Asthmatic attack  18 g  3  . ALPRAZolam (XANAX) 0.5 MG tablet Take 1 tablet (0.5 mg total) by mouth at bedtime as needed.  30 tablet  5  . clindamycin (CLEOCIN) 150 MG capsule Take 150 mg by mouth as needed. Takes 4 as needed prior to dental appt      . diclofenac sodium (VOLTAREN) 1 % GEL Apply 2 g topically 4 (four) times daily. Joint pain ankle  100 g  11  . Diclofenac-Misoprostol (ARTHROTEC PO) Take 1 tablet by mouth 3 (three) times daily.      . diphenhydrAMINE (BENADRYL) 25 MG tablet Take 25 mg by mouth at bedtime as needed. When not taking xanax.      . esomeprazole (NEXIUM) 40 MG capsule Take 1 capsule (40 mg total) by mouth 2 (two) times daily.  180 capsule  3  . estradiol (ESTRACE) 0.1 MG/GM vaginal cream Place 2 g vaginally once a week.      .  frovatriptan (FROVA) 2.5 MG tablet Take 1 tablet (2.5 mg total) by mouth as needed. If recurs, may repeat after 2 hours. Max of 3 tabs in 24 hours.  10 tablet  2  . hyaluronate sodium (RADIAPLEXRX) GEL Apply topically 2 (two) times daily.      Marland Kitchen lisinopril (PRINIVIL,ZESTRIL) 5 MG tablet Take 1 tablet (5 mg total) by mouth daily.  90 tablet  3  . metFORMIN (GLUMETZA) 500 MG (MOD) 24 hr tablet Take 1 tablet (500 mg total) by mouth daily with breakfast.  90 tablet  3  . non-metallic deodorant (ALRA) MISC Apply 1 application topically daily as needed.      Marland Kitchen PARoxetine (PAXIL) 40 MG tablet Take 1 tablet (40 mg total) by mouth daily.  90 tablet  3   No current facility-administered medications for this encounter.      Physical Examination:  weight is 154 lb (69.854 kg). Her oral temperature is 98.8 F (37.1 C). Her blood pressure is 110/74 and her pulse is 71. Her respiration is 20.    Wt Readings from Last  3 Encounters:  09/13/12 154 lb (69.854 kg)  09/06/12 154 lb (69.854 kg)  08/30/12 154 lb 8 oz (70.081 kg)    The right breast area shows minimal erythema and hyperpigmentation changes. There is no skin breakdown appreciated. Lungs - Normal respiratory effort, chest expands symmetrically. Lungs are clear to auscultation, no crackles or wheezes.  Heart has regular rhythm and rate  Abdomen is soft and non tender with normal bowel sounds  Assessment:  Patient tolerating treatments well  Plan: Continue treatment per original radiation prescription

## 2012-09-14 ENCOUNTER — Ambulatory Visit
Admission: RE | Admit: 2012-09-14 | Discharge: 2012-09-14 | Disposition: A | Discharge status: Home / Self Care | Payer: 59 | Source: Ambulatory Visit | Attending: Radiation Oncology | Admitting: Radiation Oncology

## 2012-09-15 ENCOUNTER — Ambulatory Visit
Admission: RE | Admit: 2012-09-15 | Discharge: 2012-09-15 | Disposition: A | Discharge status: Home / Self Care | Payer: 59 | Source: Ambulatory Visit | Attending: Radiation Oncology | Admitting: Radiation Oncology

## 2012-09-16 ENCOUNTER — Ambulatory Visit
Admission: RE | Admit: 2012-09-16 | Discharge: 2012-09-16 | Disposition: A | Discharge status: Home / Self Care | Payer: 59 | Source: Ambulatory Visit | Attending: Radiation Oncology | Admitting: Radiation Oncology

## 2012-09-16 ENCOUNTER — Ambulatory Visit: Payer: 59

## 2012-09-19 ENCOUNTER — Ambulatory Visit
Admission: RE | Admit: 2012-09-19 | Discharge: 2012-09-19 | Disposition: A | Discharge status: Home / Self Care | Payer: 59 | Source: Ambulatory Visit | Attending: Radiation Oncology | Admitting: Radiation Oncology

## 2012-09-19 ENCOUNTER — Encounter: Payer: Self-pay | Admitting: Radiation Oncology

## 2012-09-19 ENCOUNTER — Ambulatory Visit: Payer: 59

## 2012-09-19 VITALS — BP 98/58 | HR 78 | Resp 16 | Wt 154.0 lb

## 2012-09-19 DIAGNOSIS — C50211 Malignant neoplasm of upper-inner quadrant of right female breast: Secondary | ICD-10-CM

## 2012-09-19 MED ORDER — RADIAPLEXRX EX GEL
Freq: Once | CUTANEOUS | Status: AC
  Administered 2012-09-19: 11:00:00 via TOPICAL

## 2012-09-19 NOTE — Progress Notes (Signed)
[facility]    Radiation Oncology [address] [place]     Maryln Gottron, M.D. [place], [place] [address]               Billie Lade, M.D., Ph.D. Phone: [phone]      Molli Hazard A. Kathrynn Running, M.D. Fax: [phone]      Radene Gunning, M.D., Ph.D.         Lurline Hare, M.D.         Grayland Jack, M.D Weekly Treatment Management Note  Name: Alisha Matthews     MRN: [id number]        CSN: [id number] Date: 09/19/2012      DOB: 1951/05/29  CC: Nilda Simmer, MD         Katrinka Blazing    Status: Outpatient  Diagnosis: The encounter diagnosis was Cancer of upper-inner quadrant of female breast, right.  Current Dose: 60.4 Gy  Current Fraction: 33  Planned Dose: 60.4 Gy  Narrative: Alisha Matthews was seen today for weekly treatment management. The chart was checked and port films  were reviewed. She is happy to complete her radiation therapy today.  She denies any significant itching or discomfort in the breast area.  Penicillins  Current Outpatient Prescriptions  Medication Sig Dispense Refill  . albuterol (PROVENTIL HFA;VENTOLIN HFA) 108 (90 BASE) MCG/ACT inhaler Inhale 2 puffs into the lungs every 6 (six) hours as needed. Asthmatic attack  18 g  3  . ALPRAZolam (XANAX) 0.5 MG tablet Take 1 tablet (0.5 mg total) by mouth at bedtime as needed.  30 tablet  5  . clindamycin (CLEOCIN) 150 MG capsule Take 150 mg by mouth as needed. Takes 4 as needed prior to dental appt      . diclofenac sodium (VOLTAREN) 1 % GEL Apply 2 g topically 4 (four) times daily. Joint pain ankle  100 g  11  . Diclofenac-Misoprostol (ARTHROTEC PO) Take 1 tablet by mouth 3 (three) times daily.      . diphenhydrAMINE (BENADRYL) 25 MG tablet Take 25 mg by mouth at bedtime as needed. When not taking xanax.      . esomeprazole (NEXIUM) 40 MG capsule Take 1 capsule (40 mg total) by mouth 2 (two) times daily.  180 capsule  3  . estradiol (ESTRACE) 0.1 MG/GM vaginal cream Place 2 g vaginally once a week.      .  frovatriptan (FROVA) 2.5 MG tablet Take 1 tablet (2.5 mg total) by mouth as needed. If recurs, may repeat after 2 hours. Max of 3 tabs in 24 hours.  10 tablet  2  . hyaluronate sodium (RADIAPLEXRX) GEL Apply topically 2 (two) times daily.      Marland Kitchen lisinopril (PRINIVIL,ZESTRIL) 5 MG tablet Take 1 tablet (5 mg total) by mouth daily.  90 tablet  3  . metFORMIN (GLUMETZA) 500 MG (MOD) 24 hr tablet Take 1 tablet (500 mg total) by mouth daily with breakfast.  90 tablet  3  . non-metallic deodorant (ALRA) MISC Apply 1 application topically daily as needed.      Marland Kitchen PARoxetine (PAXIL) 40 MG tablet Take 1 tablet (40 mg total) by mouth daily.  90 tablet  3   No current facility-administered medications for this encounter.   Labs:    Physical Examination:  weight is 154 lb (69.854 kg). Her blood pressure is 98/58 and her pulse is 78. Her respiration is 16.    Wt Readings from Last 3 Encounters:  09/19/12 154 lb (  69.854 kg)  09/13/12 154 lb (69.854 kg)  09/06/12 154 lb (69.854 kg)     Lungs - Normal respiratory effort, chest expands symmetrically. Lungs are clear to auscultation, no crackles or wheezes.  Heart has regular rhythm and rate  Abdomen is soft and non tender with normal bowel sounds  Assessment:  Patient tolerated treatments guite well  Plan: Routine followup in one month.

## 2012-09-19 NOTE — Progress Notes (Signed)
Patient presents to the clinic today unaccompanied for PUT with Dr. Roselind Messier following final treatment. Patient is alert and oriented to person, place, and time. No distress noted. Steady gait noted. Pleasant affect noted. Patient denies pain at this time. Patient reports using radiaplex bid as directed. Encouraged patient to continues use for next two weeks. Provided patient with additional tube. Faint hyperpigmentation without desquamation of right breast noted. Provided patient with appointment card to return in one month for follow up. Provided patient with [facility] and [facility] flyer then, reviewed pertinent information. Provided patient with appointment card and encouraged to call with future needs. Reported all findings to Dr. Roselind Messier.

## 2012-09-20 ENCOUNTER — Ambulatory Visit: Payer: 59

## 2012-09-20 ENCOUNTER — Encounter: Payer: 59 | Admitting: Obstetrics and Gynecology

## 2012-09-25 ENCOUNTER — Encounter: Payer: Self-pay | Admitting: Radiation Oncology

## 2012-09-25 NOTE — Progress Notes (Signed)
  Radiation Oncology         [phone] ________________________________  Name: ALIAH ERIKSSON MRN: [id number]  Date: 09/25/2012  DOB: 09-12-1950  End of Treatment Note  Diagnosis:    Cancer of upper-inner quadrant of female breast, right.    Indication for treatment:  Breast conservation therapy       Radiation treatment dates:   08/03/2012 through 09/19/2012  Site/dose:   Right breast 50.4 Gy in 28 fractions, the upper inner aspect of the breast was boosted to a cumulative dose of 60.4 Gy  Beams/energy:   Tangential beams encompass in the right breast for her initial treatment, custom electron cutout field for the boost treatment, 12 MeV electrons  Narrative: The patient tolerated radiation treatment relatively well.   She did have some fatigue as she progressed to the treatment area. This fatigue resulted in significant sleeping on the weekends. She had minimal itching and discomfort in the breast area. No moist desquamation.  Plan: The patient has completed radiation treatment. The patient will return to radiation oncology clinic for routine followup in one month. I advised them to call or return sooner if they have any questions or concerns related to their recovery or treatment.  -----------------------------------  Billie Lade, PhD, MD

## 2012-09-27 ENCOUNTER — Encounter: Payer: Self-pay | Admitting: Obstetrics and Gynecology

## 2012-09-27 ENCOUNTER — Ambulatory Visit (INDEPENDENT_AMBULATORY_CARE_PROVIDER_SITE_OTHER): Payer: 59 | Admitting: Obstetrics and Gynecology

## 2012-09-27 VITALS — BP 120/74 | Ht 62.0 in | Wt 154.0 lb

## 2012-09-27 DIAGNOSIS — Z01419 Encounter for gynecological examination (general) (routine) without abnormal findings: Secondary | ICD-10-CM

## 2012-09-27 NOTE — Patient Instructions (Addendum)

## 2012-09-27 NOTE — Progress Notes (Signed)
62 y.o.  Married  Caucasian female   G3P0030 here for annual exam.  PCP has been doing exams.  But recently dx/d with Rt breast cancer in Nov 2013, had lumpectomy, and was only 5 mm Dr Dwain Sarna, had repeat excision and then margins were clear.  Started Rad tx, finished last Monday.  Next week sees Dr. Darnelle Catalan for consideration of tamoxifen, but patient very frightened of side effects of tamoxifen.  Patient doing naturopathic remedies, and has prayed about it, and plans to just go listen to Dr. Darnelle Catalan.  Pt here mostly because she wanted a second opinion about whether she should take the tamoxifen, and also because she needs a pap.  Pt has had much stress over the past several years, losing both her parents, and her husband having had a CVA and a disection AAA.  He is now recovered and back to work.  Dennie Bible has lost 80 pounds over the past about 8 years.      Patient's last menstrual period was 06/22/2000.          Sexually active: yes  The current method of family planning is post menopausal status.    Exercising: yes cardio Last mammogram:  05/04/2012 Rt ductal invasive stage 1A Last pap smear: 03/23/2011 normal History of abnormal pap: 20years ago ascus pap CIN 1(cryo sx) Smoking:no Alcohol:occ glass of wine Last colonoscopy:07/24/2011 normal return in 18yrs Last Bone Density:  Osteopenia 2004 Last tetanus shot:1997 Last cholesterol check: 07/2012 normal  Hgb:  pcp            Urine:pcp    Health Maintenance  Topic Date Due  . Tetanus/tdap  11/28/1967  . Zostavax  11/27/2008  . Influenza Vaccine  02/20/2013  . Pap Smear  04/16/2013  . Mammogram  05/27/2014  . Colonoscopy  08/24/2021    Family History  Problem Relation Age of Onset  . Adopted: Yes  . Hypertension Mother   . Diabetes Mother   . Heart failure Mother   . Cancer Maternal Grandmother 51    non hodgkins lymphoma    Patient Active Problem List  Diagnosis  . Cancer of upper-inner quadrant of female breast  . Annual  physical exam  . Type II or unspecified type diabetes mellitus without mention of complication, not stated as uncontrolled  . Anxiety and depression  . Pure hypercholesterolemia    Past Medical History  Diagnosis Date  . Anxiety   . Arthritis   . Diabetes mellitus     borderline  . GERD (gastroesophageal reflux disease)   . Hyperlipidemia   . Insomnia, unspecified   . Leukocytopenia, unspecified   . Barrett's esophagus   . Breast fibrocystic disorder   . Genital warts   . Osteoarthrosis, unspecified whether generalized or localized, ankle and foot     ankle/foot  . Exercise induced bronchospasm   . Personal history of colonic polyps   . Migraine, unspecified, without mention of intractable migraine without mention of status migrainosus   . Symptomatic menopausal or female climacteric states   . Obesity, unspecified   . Essential hypertension, benign   . Depressive disorder, not elsewhere classified   . Anemia, unspecified   . Chicken pox   . Measles   . Mumps   . Breast cancer     right breast invasive ductal carcinoma     Past Surgical History  Procedure Laterality Date  . Cholecystectomy    . Bunionectomy      3 surgeries on both feet  .  Nasal septum surgery    . Total hip arthroplasty      left      Perthes disease  . Foot surgery      Left-revision  . Dilation and curettage of uterus  1978  . Breast lumpectomy with needle localization and axillary sentinel lymph node bx  06/08/2012    Procedure: BREAST LUMPECTOMY WITH NEEDLE LOCALIZATION AND AXILLARY SENTINEL LYMPH NODE BX;  Surgeon: Emelia Loron, MD;  Location: [facility];  Service: General;  Laterality: Right;  . Re-excision of breast cancer,superior margins  06/27/2012    Procedure: RE-EXCISION OF BREAST CANCER,SUPERIOR MARGINS;  Surgeon: Emelia Loron, MD;  Location: [facility];  Service: General;  Laterality: Right;    Allergies: Penicillins  Current Outpatient  Prescriptions  Medication Sig Dispense Refill  . albuterol (PROVENTIL HFA;VENTOLIN HFA) 108 (90 BASE) MCG/ACT inhaler Inhale 2 puffs into the lungs every 6 (six) hours as needed. Asthmatic attack  18 g  3  . ALPRAZolam (XANAX) 0.5 MG tablet Take 1 tablet (0.5 mg total) by mouth at bedtime as needed.  30 tablet  5  . diclofenac sodium (VOLTAREN) 1 % GEL Apply 2 g topically 4 (four) times daily. Joint pain ankle  100 g  11  . Diclofenac-Misoprostol (ARTHROTEC PO) Take 1 tablet by mouth 3 (three) times daily.      . diphenhydrAMINE (BENADRYL) 25 MG tablet Take 25 mg by mouth at bedtime as needed. When not taking xanax.      . esomeprazole (NEXIUM) 40 MG capsule Take 1 capsule (40 mg total) by mouth 2 (two) times daily.  180 capsule  3  . frovatriptan (FROVA) 2.5 MG tablet Take 1 tablet (2.5 mg total) by mouth as needed. If recurs, may repeat after 2 hours. Max of 3 tabs in 24 hours.  10 tablet  2  . hyaluronate sodium (RADIAPLEXRX) GEL Apply topically 2 (two) times daily.      Marland Kitchen lisinopril (PRINIVIL,ZESTRIL) 5 MG tablet Take 1 tablet (5 mg total) by mouth daily.  90 tablet  3  . metFORMIN (GLUMETZA) 500 MG (MOD) 24 hr tablet Take 1 tablet (500 mg total) by mouth daily with breakfast.  90 tablet  3  . non-metallic deodorant (ALRA) MISC Apply 1 application topically daily as needed.      Marland Kitchen PARoxetine (PAXIL) 40 MG tablet Take 1 tablet (40 mg total) by mouth daily.  90 tablet  3  . clindamycin (CLEOCIN) 150 MG capsule Take 150 mg by mouth as needed. Takes 4 as needed prior to dental appt      . estradiol (ESTRACE) 0.1 MG/GM vaginal cream Place 2 g vaginally once a week.       No current facility-administered medications for this visit.    ROS: Pertinent items are noted in HPI.  Exam:    BP 120/74  Ht 5\' 2"  (1.575 m)  Wt 154 lb (69.854 kg)  BMI 28.16 kg/m2  LMP 06/22/2000   Wt Readings from Last 3 Encounters:  09/27/12 154 lb (69.854 kg)  09/19/12 154 lb (69.854 kg)  09/13/12 154 lb (69.854  kg)     Ht Readings from Last 3 Encounters:  09/27/12 5\' 2"  (1.575 m)  08/08/12 5' 3.5" (1.613 m)  07/20/12 5\' 4"  (1.626 m)    General appearance: alert, cooperative and appears stated age Head: Normocephalic, without obvious abnormality, atraumatic Neck: no adenopathy, supple, symmetrical, trachea midline and thyroid not enlarged, symmetric, no tenderness/mass/nodules Lungs: clear to auscultation  bilaterally Breasts: Inspection right breast with healed scar across upper quadrant, nipple and areola red and flaking., No nipple retraction or dimpling, No nipple discharge or bleeding, No axillary or supraclavicular adenopathy, Normal to palpation without dominant masses Heart: regular rate and rhythm Abdomen: soft, non-tender; bowel sounds normal; no masses,  no organomegaly Extremities: extremities normal, atraumatic, no cyanosis or edema Skin: Skin color, texture, turgor normal. No rashes or lesions Lymph nodes: Cervical, supraclavicular, and axillary nodes normal. No abnormal inguinal nodes palpated Neurologic: Grossly normal   Pelvic: External genitalia:  no lesions              Urethra:  normal appearing urethra with no masses, tenderness or lesions              Bartholins and Skenes: normal                 Vagina: normal appearing vagina with normal color and discharge, no lesions              Cervix: normal appearance              Pap taken: yes        Bimanual Exam:  Uterus:  uterus is normal size, shape, consistency and nontender, RF, non tender                                      Adnexa: normal adnexa in size, nontender and no masses                                      Rectovaginal: Confirms                                      Anus:  normal sphincter tone, no lesions  A: normal gyn exam     Recently diagnosed right breast caner, s/p lumpectomy and RT, may start tamoxifen     P: mammogram pap smear counseled on breast self exam, mammography screening, adequate intake  of calcium and vitamin D return annually or prn   Discussed risks of tamoxifen and many many questions answered.  Pt states she feels more "at peace" now about tamoxifen.    An After Visit Summary was printed and given to the patient.

## 2012-09-30 LAB — IPS PAP TEST WITH HPV

## 2012-10-06 ENCOUNTER — Encounter: Payer: Self-pay | Admitting: Radiation Oncology

## 2012-10-06 ENCOUNTER — Telehealth: Payer: Self-pay | Admitting: *Deleted

## 2012-10-06 ENCOUNTER — Ambulatory Visit (HOSPITAL_BASED_OUTPATIENT_CLINIC_OR_DEPARTMENT_OTHER): Payer: 59 | Admitting: Oncology

## 2012-10-06 VITALS — BP 122/79 | HR 83 | Temp 98.7°F | Resp 20 | Ht 62.0 in | Wt 153.4 lb

## 2012-10-06 DIAGNOSIS — C50219 Malignant neoplasm of upper-inner quadrant of unspecified female breast: Secondary | ICD-10-CM

## 2012-10-06 DIAGNOSIS — C50211 Malignant neoplasm of upper-inner quadrant of right female breast: Secondary | ICD-10-CM

## 2012-10-06 MED ORDER — TAMOXIFEN CITRATE 20 MG PO TABS: 20.0000 mg | ORAL_TABLET | Freq: Every day | ORAL | Status: DC

## 2012-10-06 MED ORDER — FLUCONAZOLE 100 MG PO TABS: 100.0000 mg | ORAL_TABLET | Freq: Every day | ORAL | Status: DC

## 2012-10-06 MED ORDER — MICONAZOLE NITRATE 2 % EX CREA: TOPICAL_CREAM | Freq: Two times a day (BID) | CUTANEOUS | Status: DC

## 2012-10-06 NOTE — Telephone Encounter (Signed)
gv appt for 01/03/13@ 9am @ the pt request...td

## 2012-10-06 NOTE — Progress Notes (Signed)
ID: Alisha Matthews   DOB: 25-Jun-1950  MR#: [id number]  VHQ#:[id number]  PCP: Nilda Simmer, MD GYN: Meredeth Ide SU: Emelia Loron OTHER MD: Rogelia Mire, Claudette Head   HISTORY OF PRESENT ILLNESS: The patient had screening mammography at [facility] showing a worrisome area of calcifications.. I do not have a copy of that study or the following diagnostic mammography, but on 05/12/2012 the patient underwent right breast biopsy showing ([id number]) and invasive ductal carcinoma, E-cadherin positive, estrogen receptor 100% and progesterone receptor 22% positive, with an MIB-1 of 6%, and no amplification of HER-2.  On 05/20/2012 the patient underwent bilateral breast MRI, and this showed, on the right side, and 25 mm area of moderate enhancement, and on the left a 10 mm area of clumped linear enhancement. There was no abnormal lymphadenopathy noted. In the right lobe of the liver a 13 mm hyperintense lesion was incidentally noted.  On 05/24/2012 the patient underwent abdominal ultrasonography showing a complex cyst in the right liver lobe measuring 1.7 cm. There was no solid component. The liver was otherwise minimally inhomogeneous. On 05/27/2012 the patient underwent biopsy of this suspicious area on the left breast, and this turned out to be a fibroadenoma ([id number]).  Accordingly on 06/08/2012 the patient underwent left lumpectomy and sentinel lymph node sampling under [facility]. The final pathology ([id number]) showed a grade 2 invasive ductal carcinoma measuring 5 mm, with focally positive/very near margins. Both sentinel lymph nodes were clear. Further surgery for margin clearance is planned prior to definitive radiation treatments. The patient's subsequent history is as detailed below.  INTERVAL HISTORY: Alisha Matthews returns today for followup of her breast cancer. Since her last visit here she completed her radiation therapy.  REVIEW OF SYSTEMS: Generally she did well with treatment. She  did have significant fatigue but has already resumed her usual very vigorous 10 minutes today on the treadmill. The "burning" of the skin is almost completely resolved. She continues to have aching in her scalp and the back of her neck and also at the base of her spine. She had some swollen lymph nodes she tells me in the back of her neck but they are now "down". There was no fever or other systemic symptoms associated with this. A detailed review of systems was otherwise noncontributory  PAST MEDICAL HISTORY: Past Medical History  Diagnosis Date  . Anxiety   . Arthritis   . Diabetes mellitus     borderline  . GERD (gastroesophageal reflux disease)   . Hyperlipidemia   . Insomnia, unspecified   . Leukocytopenia, unspecified   . Barrett's esophagus   . Breast fibrocystic disorder   . Genital warts   . Osteoarthrosis, unspecified whether generalized or localized, ankle and foot     ankle/foot  . Exercise induced bronchospasm   . Personal history of colonic polyps   . Migraine, unspecified, without mention of intractable migraine without mention of status migrainosus   . Symptomatic menopausal or female climacteric states   . Obesity, unspecified   . Essential hypertension, benign   . Depressive disorder, not elsewhere classified   . Anemia, unspecified   . Chicken pox   . Measles   . Mumps   . Breast cancer     right breast invasive ductal carcinoma     PAST SURGICAL HISTORY: Past Surgical History  Procedure Laterality Date  . Cholecystectomy    . Bunionectomy      3 surgeries on both feet  . Nasal septum surgery    .  Total hip arthroplasty      left      Perthes disease  . Foot surgery      Left-revision  . Dilation and curettage of uterus  1978  . Breast lumpectomy with needle localization and axillary sentinel lymph node bx  06/08/2012    Procedure: BREAST LUMPECTOMY WITH NEEDLE LOCALIZATION AND AXILLARY SENTINEL LYMPH NODE BX;  Surgeon: Emelia Loron, MD;   Location: [facility];  Service: General;  Laterality: Right;  . Re-excision of breast cancer,superior margins  06/27/2012    Procedure: RE-EXCISION OF BREAST CANCER,SUPERIOR MARGINS;  Surgeon: Emelia Loron, MD;  Location: [facility];  Service: General;  Laterality: Right;    FAMILY HISTORY Family History  Problem Relation Age of Onset  . Adopted: Yes  . Hypertension Mother   . Diabetes Mother   . Heart failure Mother   . Cancer Maternal Grandmother 39    non hodgkins lymphoma   the patient knows little about her father, other than the fact that he died at the age of 46, causes unknown to her. The patient's mother died from complications of diabetes at the age of 41. The patient has 2 full and one half brother. She has no sisters. There is no history of breast or ovarian cancer in the family.  GYNECOLOGIC HISTORY: Menarche age 2. She is GX P0. Menopause approximately 2001. She never took hormone replacement.  SOCIAL HISTORY: She has worked as a Production assistant, radio and was involved with the [facility] transplant program while I was there. Her husband Verneda Skill Okane is a Surveyor, mining in a pain management clinic in [place]. Hertha has told me the story of his ascending aortic dissecting aneurysm and his miraculous recovery. At home with them are numerous cats. Also 1 dog.   ADVANCED DIRECTIVES: Sharanya has a living will and her husband is her healthcare power of attorney.  HEALTH MAINTENANCE: History  Substance Use Topics  . Smoking status: Never Smoker   . Smokeless tobacco: Never Used  . Alcohol Use: 0.5 oz/week    1 drink(s) per week     Comment: 1-2 glasses of wine a year     Colonoscopy: 2013, under Claudette Head  PAP: November 2012  Bone density: 2004, showing osteopenia  Lipid panel:  Allergies  Allergen Reactions  . Penicillins Rash    Current Outpatient Prescriptions  Medication Sig Dispense Refill  . albuterol (PROVENTIL  HFA;VENTOLIN HFA) 108 (90 BASE) MCG/ACT inhaler Inhale 2 puffs into the lungs every 6 (six) hours as needed. Asthmatic attack  18 g  3  . ALPRAZolam (XANAX) 0.5 MG tablet Take 1 tablet (0.5 mg total) by mouth at bedtime as needed.  30 tablet  5  . clindamycin (CLEOCIN) 150 MG capsule Take 150 mg by mouth as needed. Takes 4 as needed prior to dental appt      . diclofenac sodium (VOLTAREN) 1 % GEL Apply 2 g topically 4 (four) times daily. Joint pain ankle  100 g  11  . Diclofenac-Misoprostol (ARTHROTEC PO) Take 1 tablet by mouth 3 (three) times daily.      . diphenhydrAMINE (BENADRYL) 25 MG tablet Take 25 mg by mouth at bedtime as needed. When not taking xanax.      . esomeprazole (NEXIUM) 40 MG capsule Take 1 capsule (40 mg total) by mouth 2 (two) times daily.  180 capsule  3  . estradiol (ESTRACE) 0.1 MG/GM vaginal cream Place 2 g vaginally once a  week.      . frovatriptan (FROVA) 2.5 MG tablet Take 1 tablet (2.5 mg total) by mouth as needed. If recurs, may repeat after 2 hours. Max of 3 tabs in 24 hours.  10 tablet  2  . hyaluronate sodium (RADIAPLEXRX) GEL Apply topically 2 (two) times daily.      Marland Kitchen lisinopril (PRINIVIL,ZESTRIL) 5 MG tablet Take 1 tablet (5 mg total) by mouth daily.  90 tablet  3  . metFORMIN (GLUMETZA) 500 MG (MOD) 24 hr tablet Take 1 tablet (500 mg total) by mouth daily with breakfast.  90 tablet  3  . non-metallic deodorant (ALRA) MISC Apply 1 application topically daily as needed.      Marland Kitchen PARoxetine (PAXIL) 40 MG tablet Take 1 tablet (40 mg total) by mouth daily.  90 tablet  3   No current facility-administered medications for this visit.    OBJECTIVE: Middle-aged white woman who appears well Filed Vitals:   10/06/12 1318  BP: 122/79  Pulse: 83  Temp: 98.7 F (37.1 C)  Resp: 20     Body mass index is 28.05 kg/(m^2).    ECOG FS: 0  Sclerae unicteric Oropharynx clear No cervical or supraclavicular adenopathy Lungs no rales or rhonchi Heart regular rate and  rhythm Abd soft, nontender, positive bowel sounds MSK no focal spinal tenderness, no peripheral edema Neuro: nonfocal, well oriented Breasts: The right breast is status post lumpectomy and radiation. There is minimal erythema persisting. The right axilla is benign. The left breast is unremarkable.   LAB RESULTS: Lab Results  Component Value Date   WBC 3.6* 08/08/2012   NEUTROABS 1.7 08/08/2012   HGB 12.2 08/08/2012   HCT 36.5 08/08/2012   MCV 86.9 08/08/2012   PLT 219 08/08/2012      Chemistry      Component Value Date/Time   NA 139 08/08/2012 0855   NA 137 06/23/2012 1618   NA 139 11/04/2011   K 3.8 08/08/2012 0855   K 3.6 06/23/2012 1618   CL 104 08/08/2012 0855   CL 102 06/23/2012 1618   CO2 28 08/08/2012 0855   CO2 22 06/23/2012 1618   BUN 8 08/08/2012 0855   BUN 10.0 06/23/2012 1618   BUN 6 11/04/2011   CREATININE 0.63 08/08/2012 0855   CREATININE 0.9 06/23/2012 1618   CREATININE 0.7 11/04/2011   GLU 101 11/04/2011      Component Value Date/Time   CALCIUM 9.3 08/08/2012 0855   CALCIUM 9.2 06/23/2012 1618   ALKPHOS 56 08/08/2012 0855   ALKPHOS 68 06/23/2012 1618   AST 27 08/08/2012 0855   AST 26 06/23/2012 1618   ALT 27 08/08/2012 0855   ALT 24 06/23/2012 1618   BILITOT 0.4 08/08/2012 0855   BILITOT 0.44 06/23/2012 1618       Lab Results  Component Value Date   LABCA2 23 06/03/2012    No components found with this basename: LABCA125    No results found for this basename: INR,  in the last 168 hours  Urinalysis No results found for this basename: colorurine,  appearanceur,  labspec,  phurine,  glucoseu,  hgbur,  bilirubinur,  ketonesur,  proteinur,  urobilinogen,  nitrite,  leukocytesur    STUDIES: No results found.  ASSESSMENT: 62 y.o. [place] woman status post right breast biopsy December 2013 for a pT1a pN0, stage IA invasive ductal carcinoma, grade 2, estrogen receptor 100% positive, progesterone receptor 22% positive, with an MIB-1 of 6% and no HER-2 amplification  (1)  completed adjuvant radiation 09/19/2012  (2) started tamoxifen in mid April 2014  PLAN:  We discussed her prognosis in detail and she understands it is sufficiently good that I would not be uncomfortable if she decided not to take anti-estrogens. On the other hand my recommendation is that she consider tamoxifen. This will not only reduce the very small risk of this cancer coming back, but more importantly cut in half the risk of a new breast cancer developing. We reviewed the possible toxicities, side effects and complications. She is willing to try it and she will call us with any problems. She will see me again in 3 months. If she tolerates it well we can start seeing her underwent a year basis. She knows to call for any problems that develop before the next visit. MAGRINAT,GUSTAV C    10/06/2012

## 2012-10-06 NOTE — Progress Notes (Signed)
   Department of Radiation Oncology  Phone:  [phone] Fax:        [phone]  [facility] Benefit Specialist [facility] company  Fax #[phone]  Claim #[id number] [id number]   Patient  Alisha Matthews. Roger          date of birth 05-25-1951  Re: continuance of long-term disability from April 18 to  10/17/2012  Dear Ms. Illene Bolus,  I recently saw [name] for followup of her breast cancer treatment. She continues to have significant fatigue and would not be able to work at this time. I am requesting an extension of her long-term disability  from 10/07/2012 through 10/17/2012.  I have enclosed my recent office note.  Sincerely,   Billie Lade,  M.D. PhD

## 2012-10-07 ENCOUNTER — Telehealth: Payer: Self-pay

## 2012-10-07 DIAGNOSIS — B350 Tinea barbae and tinea capitis: Secondary | ICD-10-CM

## 2012-10-07 DIAGNOSIS — L299 Pruritus, unspecified: Secondary | ICD-10-CM

## 2012-10-07 MED ORDER — HYDROXYZINE HCL 25 MG PO TABS: 12.5000 mg | ORAL_TABLET | Freq: Three times a day (TID) | ORAL | Status: DC | PRN

## 2012-10-07 MED ORDER — KETOCONAZOLE 2 % EX SHAM: MEDICATED_SHAMPOO | CUTANEOUS | Status: DC

## 2012-10-07 NOTE — Telephone Encounter (Signed)
Pt called and reported that she saw her oncologist yesterday and he Dxd a fungal infection on her scalp and lower back and Rxd an oral antifungal and also topical for lower back. Pt reports that the itching is driving her crazy and benedryl is not helping at all. She requests that Dr Katrinka Blazing Rx an oral medication (ie hydroxyzine or anything Dr Katrinka Blazing prefers) and also an antifungal shampoo she can use for her scalp. The notes from her OV w/ Dr Darnelle Catalan and meds he Rxd are in [facility] for Dr Michaelle Copas review.

## 2012-10-07 NOTE — Telephone Encounter (Signed)
Called patient to advise  °

## 2012-10-07 NOTE — Telephone Encounter (Signed)
Rx for hydroxyzine/atarax and Ketoconazole shampoo sent to [facility] in [place].

## 2012-10-07 NOTE — Telephone Encounter (Signed)
Alisha Matthews and asked that Rxs be sent to the [facility] in [place] this time bc [facility] pharmacies are closed today. Pt reports she is in agony, the itching is so bad.

## 2012-10-10 ENCOUNTER — Telehealth: Payer: Self-pay | Admitting: *Deleted

## 2012-10-10 NOTE — Telephone Encounter (Signed)
On 10-10-12 fax letter and end of treatment note to Rashidah Grimsley Benefit Specialist at [facility].

## 2012-10-12 ENCOUNTER — Ambulatory Visit (INDEPENDENT_AMBULATORY_CARE_PROVIDER_SITE_OTHER): Payer: 59 | Admitting: Family Medicine

## 2012-10-12 VITALS — BP 110/74 | HR 80 | Temp 98.2°F | Resp 18 | Ht 63.0 in | Wt 151.0 lb

## 2012-10-12 DIAGNOSIS — E119 Type 2 diabetes mellitus without complications: Secondary | ICD-10-CM

## 2012-10-12 DIAGNOSIS — B350 Tinea barbae and tinea capitis: Secondary | ICD-10-CM

## 2012-10-12 DIAGNOSIS — C50219 Malignant neoplasm of upper-inner quadrant of unspecified female breast: Secondary | ICD-10-CM

## 2012-10-12 DIAGNOSIS — C50211 Malignant neoplasm of upper-inner quadrant of right female breast: Secondary | ICD-10-CM

## 2012-10-12 LAB — GLUCOSE, POCT (MANUAL RESULT ENTRY): POC Glucose: 117 mg/dl — AB (ref 70–99)

## 2012-10-12 MED ORDER — CETIRIZINE HCL 10 MG PO TABS: 10.0000 mg | ORAL_TABLET | Freq: Every day | ORAL | Status: DC

## 2012-10-12 MED ORDER — FLUCONAZOLE 100 MG PO TABS: 100.0000 mg | ORAL_TABLET | Freq: Every day | ORAL | Status: DC

## 2012-10-12 NOTE — Progress Notes (Signed)
[address]   [place], [place]  [address]   [phone]  Subjective:    Patient ID: Alisha Matthews, female    DOB: 02/09/1951, 62 y.o.   MRN: [id number]  HPI This 62 y.o. female presents for evaluation of the following:  1.  R breast cancer: s/p radiation; no blistering of skin.  Eating very healthy; exercises regularly.  Severe fatigue with radiation; slept a lot.  After second surgery on 06/27/12, laying on back a lot and showered less.  S/p recent hematology consult.   Discussed Tamoxifen at length, plans to try Tamoxifen for three months.  If does not tolerate medication, plans to stop it.  Eating a lot of broccoli.    2.  Itching scalp:  Started bathing every other day after surgery in January 2014; has suffered wtih itchy scalp; area size of 1/2 dollar since 06/2012.   Now, having itching posterior scalp; +pink rash and radiating into back.  S/p [facility] follow-up last week.  Diagnosed with fungal infection; prescribed Diflucan 100mg  daily x 7 days.  Also prescribed topical cream.  Only taking Atorvastatin qod since taking Diflucan.  Increased Metformin to bid.   Using ketoconazole shampoo twice weekly; Atarax not helpful.  Alcohol and cold packs give relief.  Itching actually resolved for one day; then did yard work and itching returned but smaller area.  Using Atarax sparingly.  Tried Zyrtec but no effectiveness.  50% improved from last week.  Will take last Diflucan tomorrow; thinks would benefit from further treatment.  Very frustrated by persistent itching.  Also taking Ranitidine.  No hair loss.  3.  Gynecology consultation:  S/p pap smear by [facility].   Gynecology recommended against Estrace cream for one year; Dr. Arlice Colt recommended waiting three months after Tamoxifen initiation to restart Estrace cream.    Review of Systems  Constitutional: Negative for fever, chills, diaphoresis and fatigue.  Endocrine: Negative for cold intolerance, heat intolerance, polydipsia, polyphagia and  polyuria.  Skin: Positive for color change and rash. Negative for wound.  Hematological: Positive for adenopathy.  Psychiatric/Behavioral: Negative for dysphoric mood. The patient is not nervous/anxious.        Past Medical History  Diagnosis Date  . Anxiety   . Arthritis   . Diabetes mellitus     borderline  . GERD (gastroesophageal reflux disease)   . Hyperlipidemia   . Insomnia, unspecified   . Leukocytopenia, unspecified   . Barrett's esophagus   . Breast fibrocystic disorder   . Genital warts   . Osteoarthrosis, unspecified whether generalized or localized, ankle and foot     ankle/foot  . Exercise induced bronchospasm   . Personal history of colonic polyps   . Migraine, unspecified, without mention of intractable migraine without mention of status migrainosus   . Symptomatic menopausal or female climacteric states   . Obesity, unspecified   . Essential hypertension, benign   . Depressive disorder, not elsewhere classified   . Anemia, unspecified   . Chicken pox   . Measles   . Mumps   . Breast cancer     right breast invasive ductal carcinoma    Past Surgical History  Procedure Laterality Date  . Cholecystectomy    . Bunionectomy      3 surgeries on both feet  . Nasal septum surgery    . Total hip arthroplasty      left      Perthes disease  . Foot surgery      Left-revision  .  Dilation and curettage of uterus  1978  . Breast lumpectomy with needle localization and axillary sentinel lymph node bx  06/08/2012    Procedure: BREAST LUMPECTOMY WITH NEEDLE LOCALIZATION AND AXILLARY SENTINEL LYMPH NODE BX;  Surgeon: Emelia Loron, MD;  Location: [facility];  Service: General;  Laterality: Right;  . Re-excision of breast cancer,superior margins  06/27/2012    Procedure: RE-EXCISION OF BREAST CANCER,SUPERIOR MARGINS;  Surgeon: Emelia Loron, MD;  Location: [facility];  Service: General;  Laterality: Right;   Current Outpatient  Prescriptions on File Prior to Visit  Medication Sig Dispense Refill  . albuterol (PROVENTIL HFA;VENTOLIN HFA) 108 (90 BASE) MCG/ACT inhaler Inhale 2 puffs into the lungs every 6 (six) hours as needed. Asthmatic attack  18 g  3  . ALPRAZolam (XANAX) 0.5 MG tablet Take 1 tablet (0.5 mg total) by mouth at bedtime as needed.  30 tablet  5  . Diclofenac-Misoprostol (ARTHROTEC PO) Take 1 tablet by mouth 3 (three) times daily.      . diphenhydrAMINE (BENADRYL) 25 MG tablet Take 25 mg by mouth at bedtime as needed. When not taking xanax.      . esomeprazole (NEXIUM) 40 MG capsule Take 1 capsule (40 mg total) by mouth 2 (two) times daily.  180 capsule  3  . estradiol (ESTRACE) 0.1 MG/GM vaginal cream Place 2 g vaginally once a week.      . frovatriptan (FROVA) 2.5 MG tablet Take 1 tablet (2.5 mg total) by mouth as needed. If recurs, may repeat after 2 hours. Max of 3 tabs in 24 hours.  10 tablet  2  . hyaluronate sodium (RADIAPLEXRX) GEL Apply topically 2 (two) times daily.      . hydrOXYzine (ATARAX/VISTARIL) 25 MG tablet Take 0.5-1 tablets (12.5-25 mg total) by mouth every 8 (eight) hours as needed for itching.  30 tablet  0  . ketoconazole (NIZORAL) 2 % shampoo Apply topically 2 (two) times a week.  120 mL  0  . lisinopril (PRINIVIL,ZESTRIL) 5 MG tablet Take 1 tablet (5 mg total) by mouth daily.  90 tablet  3  . metFORMIN (GLUMETZA) 500 MG (MOD) 24 hr tablet Take 1 tablet (500 mg total) by mouth daily with breakfast.  90 tablet  3  . miconazole (MICATIN) 2 % cream Apply topically 2 (two) times daily. Apply to affected area twice a day until clear  28.35 g  0  . non-metallic deodorant (ALRA) MISC Apply 1 application topically daily as needed.      Marland Kitchen PARoxetine (PAXIL) 40 MG tablet Take 1 tablet (40 mg total) by mouth daily.  90 tablet  3  . clindamycin (CLEOCIN) 150 MG capsule Take 150 mg by mouth as needed. Takes 4 as needed prior to dental appt      . diclofenac sodium (VOLTAREN) 1 % GEL Apply 2 g  topically 4 (four) times daily. Joint pain ankle  100 g  11  . tamoxifen (NOLVADEX) 20 MG tablet Take 1 tablet (20 mg total) by mouth daily.  90 tablet  12   No current facility-administered medications on file prior to visit.    Objective:   Physical Exam  Nursing note and vitals reviewed. Constitutional: She appears well-developed and well-nourished. No distress.  Eyes: Conjunctivae are normal. Pupils are equal, round, and reactive to light.  Neck: Normal range of motion. Neck supple. No thyromegaly present.  Cardiovascular: Normal rate and regular rhythm.   Pulmonary/Chest: Effort normal and breath sounds normal.  Lymphadenopathy:    She has no cervical adenopathy.  Skin: Rash noted. She is not diaphoretic.  Erythematous rash posterior scalp and post-auricular regions scalp.  Diffuse faint erythematous slightly raised rash posterior neck and upper back.  Lower back with 8 cm x 5 cm raised, scaling, erythematous rash with defined border.  Psychiatric: She has a normal mood and affect. Her behavior is normal. Judgment and thought content normal.       Results for orders placed in visit on 10/12/12  GLUCOSE, POCT (MANUAL RESULT ENTRY)      Result Value Range   POC Glucose 117 (*) 70 - 99 mg/dl    Assessment & Plan:  Tinea capitis - Plan: fluconazole (DIFLUCAN) 100 MG tablet, DISCONTINUED: fluconazole (DIFLUCAN) 100 MG tablet  Cancer of upper-inner quadrant of female breast, right - Plan: fluconazole (DIFLUCAN) 100 MG tablet, DISCONTINUED: fluconazole (DIFLUCAN) 100 MG tablet  Type II or unspecified type diabetes mellitus without mention of complication, not stated as uncontrolled - Plan: POCT glucose (manual entry)   1. Tinea Capitus and Corporis:  New to this provider; 50% improved by report from onset; refill of Diflucan provided for ten additional days; continue Ketoconazole shampoo twice weekly; can use topical Miconazole cream to rash on torso.  Recommend Zyrtec daily for  itching; Benadryl qhs for itching. 2.  R breast cancer: stable; s/p lumpectomy with radiation; to start Tamoxifen in upcoming two weeks.  Feeling well. 3.  DMII: stable; controlled; non-fasting sugar today.    Meds ordered this encounter  Medications  . DISCONTD: fluconazole (DIFLUCAN) 100 MG tablet    Sig: Take 1 tablet (100 mg total) by mouth daily.    Dispense:  10 tablet    Refill:  0  . DISCONTD: cetirizine (ZYRTEC) 10 MG tablet    Sig: Take 1 tablet (10 mg total) by mouth daily.    Dispense:  30 tablet    Refill:  11  . fluconazole (DIFLUCAN) 100 MG tablet    Sig: Take 1 tablet (100 mg total) by mouth daily.    Dispense:  10 tablet    Refill:  0  . cetirizine (ZYRTEC) 10 MG tablet    Sig: Take 1 tablet (10 mg total) by mouth daily.    Dispense:  30 tablet    Refill:  11

## 2012-10-16 ENCOUNTER — Telehealth: Payer: Self-pay | Admitting: Family Medicine

## 2012-10-16 DIAGNOSIS — C50211 Malignant neoplasm of upper-inner quadrant of right female breast: Secondary | ICD-10-CM

## 2012-10-16 DIAGNOSIS — B350 Tinea barbae and tinea capitis: Secondary | ICD-10-CM

## 2012-10-16 NOTE — Telephone Encounter (Signed)
Patient request another week worth of Diflucan. When she was seen by you last thought 50% better and now thinks probably 65%. [facility] Please advise

## 2012-10-17 ENCOUNTER — Encounter: Payer: Self-pay | Admitting: Radiation Oncology

## 2012-10-17 ENCOUNTER — Ambulatory Visit
Admission: RE | Admit: 2012-10-17 | Discharge: 2012-10-17 | Disposition: A | Discharge status: Home / Self Care | Payer: 59 | Source: Ambulatory Visit | Attending: Radiation Oncology | Admitting: Radiation Oncology

## 2012-10-17 VITALS — BP 116/51 | HR 74 | Temp 98.1°F | Resp 16 | Wt 152.5 lb

## 2012-10-17 DIAGNOSIS — C50211 Malignant neoplasm of upper-inner quadrant of right female breast: Secondary | ICD-10-CM

## 2012-10-17 MED ORDER — FLUCONAZOLE 100 MG PO TABS: 100.0000 mg | ORAL_TABLET | Freq: Every day | ORAL | Status: DC

## 2012-10-17 NOTE — Telephone Encounter (Signed)
Thanks I have called her to advise.  °

## 2012-10-17 NOTE — Progress Notes (Addendum)
Patient presents to the clinic today unaccompanied for follow up appointment with Dr. Roselind Messier. Patient is alert and oriented to person, place, and time. No distress noted. Steady gait noted. Pleasant affect noted. Patient denies pain at this time. Patient has script from [facility] for tamoxifen but, hasn't started it yet because she is trying to heal tinna capitus. Patient taking atarax and clindamycin for this tinna capitus. Skin of right breast has returned to normal texture and color. Patient reports that she continues to use radiaplex bid. Patient reports that Dr. Darnelle Catalan told her she has a "1/2 dollar size fluid filled sac) to the left of her right nipple. Patient denies edema or warmth of the right breast. Patient denies nipple discharge or bleeding. No edema of right hand or arm noted. Patient's only complaint is fatigue. Reported all findings to Dr. Roselind Messier.

## 2012-10-17 NOTE — Telephone Encounter (Signed)
Rx for refill of Diflucan sent to pharmacy.

## 2012-10-17 NOTE — Progress Notes (Signed)
Radiation Oncology         [phone] ________________________________  Name: Alisha Matthews MRN: [id number]  Date: 10/17/2012  DOB: 06-15-1951  Follow-Up Visit Note  CC: Nilda Simmer, MD  Emelia Loron, MD  Diagnosis:   Right breast cancer  Interval Since Last Radiation:  One month   Narrative:  The patient returns today for routine follow-up.  She continues to have a significant amount of fatigue. She sleeps approximately 12-13 hours per day.  This amount of fatigue is unusual for radiation therapy. Patient however was diagnosed with a significant fungal infection which may be explaining some of her symptoms.  This is being treated at this time and patient is starting to feel better.  She has minimal discomfort in the right breast. She denies any nipple discharge or bleeding. She denies any swelling in her right arm or hand.                              ALLERGIES:  is allergic to penicillins.  Meds: Current Outpatient Prescriptions  Medication Sig Dispense Refill  . albuterol (PROVENTIL HFA;VENTOLIN HFA) 108 (90 BASE) MCG/ACT inhaler Inhale 2 puffs into the lungs every 6 (six) hours as needed. Asthmatic attack  18 g  3  . ALPRAZolam (XANAX) 0.5 MG tablet Take 1 tablet (0.5 mg total) by mouth at bedtime as needed.  30 tablet  5  . cetirizine (ZYRTEC) 10 MG tablet Take 1 tablet (10 mg total) by mouth daily.  30 tablet  11  . diclofenac sodium (VOLTAREN) 1 % GEL Apply 2 g topically 4 (four) times daily. Joint pain ankle  100 g  11  . Diclofenac-Misoprostol (ARTHROTEC PO) Take 1 tablet by mouth 3 (three) times daily.      . diphenhydrAMINE (BENADRYL) 25 MG tablet Take 25 mg by mouth at bedtime as needed. When not taking xanax.      . esomeprazole (NEXIUM) 40 MG capsule Take 1 capsule (40 mg total) by mouth 2 (two) times daily.  180 capsule  3  . estradiol (ESTRACE) 0.1 MG/GM vaginal cream Place 2 g vaginally once a week.      . fluconazole (DIFLUCAN) 100 MG tablet Take 1 tablet (100  mg total) by mouth daily.  10 tablet  0  . frovatriptan (FROVA) 2.5 MG tablet Take 1 tablet (2.5 mg total) by mouth as needed. If recurs, may repeat after 2 hours. Max of 3 tabs in 24 hours.  10 tablet  2  . hyaluronate sodium (RADIAPLEXRX) GEL Apply topically 2 (two) times daily.      . hydrOXYzine (ATARAX/VISTARIL) 25 MG tablet Take 0.5-1 tablets (12.5-25 mg total) by mouth every 8 (eight) hours as needed for itching.  30 tablet  0  . ketoconazole (NIZORAL) 2 % shampoo Apply topically 2 (two) times a week.  120 mL  0  . lisinopril (PRINIVIL,ZESTRIL) 5 MG tablet Take 1 tablet (5 mg total) by mouth daily.  90 tablet  3  . metFORMIN (GLUMETZA) 500 MG (MOD) 24 hr tablet Take 1 tablet (500 mg total) by mouth daily with breakfast.  90 tablet  3  . miconazole (MICATIN) 2 % cream Apply topically 2 (two) times daily. Apply to affected area twice a day until clear  28.35 g  0  . non-metallic deodorant (ALRA) MISC Apply 1 application topically daily as needed.      Marland Kitchen PARoxetine (PAXIL) 40 MG tablet Take 1 tablet (  40 mg total) by mouth daily.  90 tablet  3  . tamoxifen (NOLVADEX) 20 MG tablet Take 1 tablet (20 mg total) by mouth daily.  90 tablet  12  . clindamycin (CLEOCIN) 150 MG capsule Take 150 mg by mouth as needed. Takes 4 as needed prior to dental appt       No current facility-administered medications for this encounter.    Physical Findings: The patient is in no acute distress. Patient is alert and oriented.  weight is 152 lb 8 oz (69.174 kg). Her oral temperature is 98.1 F (36.7 C). Her blood pressure is 116/51 and her pulse is 74. Her respiration is 16 and oxygen saturation is 100%. . No palpable supraclavicular or axillary adenopathy. The lungs are clear to auscultation. The heart has a regular rhythm and rate. Examination left breast reveals no mass or nipple discharge. Examination of the right breast reveals mild hyperpigmentation changes. The patient's skin is well healed at this time. She  appears to have a seroma along the medial aspect of her lumpectomy scar which measures approximately 2 cm in size. This is mildly tender with palpation. There is no dominant mass appreciated in the breast nipple discharge or bleeding.   Impression:  The patient is recovering from the effects of radiation.  She continues to have significant fatigue which may be explained in part by her significant fungal infection.  Plan:  Routine followup in 5 months.  _____________________________________  -----------------------------------  Billie Lade, PhD, MD

## 2012-10-23 ENCOUNTER — Telehealth: Payer: Self-pay

## 2012-10-23 DIAGNOSIS — B350 Tinea barbae and tinea capitis: Secondary | ICD-10-CM

## 2012-10-23 DIAGNOSIS — C50211 Malignant neoplasm of upper-inner quadrant of right female breast: Secondary | ICD-10-CM

## 2012-10-23 MED ORDER — FLUCONAZOLE 100 MG PO TABS: 100.0000 mg | ORAL_TABLET | Freq: Every day | ORAL | Status: DC

## 2012-10-23 NOTE — Telephone Encounter (Signed)
Rx sent to pharmacy; pt advised.

## 2012-10-23 NOTE — Telephone Encounter (Signed)
Dr Katrinka Blazing, pt called about her tinea capitus and has been treated with Diflucan. She would like another 7-10 days of Diflucan called to her pharmacy. She has enough to get her through Sat but would like some more called in. She is about 70 percent better. She has an appointment with you next Monday. Please advise.

## 2012-10-25 ENCOUNTER — Telehealth: Payer: Self-pay | Admitting: *Deleted

## 2012-10-25 NOTE — Telephone Encounter (Signed)
Patient returning message left by [facility], RN. Patient explains that her original doctor's note from Dr. Roselind Messier has her returning to work on 11/09/2012. Patient requesting a doctor's note that states she "can't return to work until after 11/09/2012." Patient goes on to say that she feels her career is done and that she cleaned out her locker yesterday. Patient explains that her [employer] insurance will begin 11/18/2012 because her [employer] benefits will stop 11/09/2012. Patient expresses that she is unable to return to work due to fatigue, rash, fungal infection, recent root canal reversal, and pending knee replacement. Routed this message to [facility], [place] and Dr. Roselind Messier. Informed patient one of our staff would be in touch once the document was ready for pick up and she verbalized understanding.

## 2012-10-26 ENCOUNTER — Encounter: Payer: Self-pay | Admitting: Radiation Oncology

## 2012-10-31 ENCOUNTER — Ambulatory Visit (INDEPENDENT_AMBULATORY_CARE_PROVIDER_SITE_OTHER): Payer: 59 | Admitting: Family Medicine

## 2012-10-31 ENCOUNTER — Encounter: Payer: Self-pay | Admitting: Family Medicine

## 2012-10-31 ENCOUNTER — Encounter: Payer: Self-pay | Admitting: Radiation Oncology

## 2012-10-31 ENCOUNTER — Telehealth: Payer: Self-pay | Admitting: *Deleted

## 2012-10-31 VITALS — BP 119/70 | HR 63 | Temp 98.0°F | Resp 16 | Ht 63.0 in | Wt 149.0 lb

## 2012-10-31 DIAGNOSIS — F329 Major depressive disorder, single episode, unspecified: Secondary | ICD-10-CM

## 2012-10-31 DIAGNOSIS — E7800 Pure hypercholesterolemia, unspecified: Secondary | ICD-10-CM

## 2012-10-31 DIAGNOSIS — E119 Type 2 diabetes mellitus without complications: Secondary | ICD-10-CM

## 2012-10-31 DIAGNOSIS — B350 Tinea barbae and tinea capitis: Secondary | ICD-10-CM | POA: Insufficient documentation

## 2012-10-31 DIAGNOSIS — I10 Essential (primary) hypertension: Secondary | ICD-10-CM

## 2012-10-31 LAB — COMPREHENSIVE METABOLIC PANEL
AST: 31 U/L (ref 0–37)
Albumin: 4.4 g/dL (ref 3.5–5.2)
Alkaline Phosphatase: 52 U/L (ref 39–117)
BUN: 9 mg/dL (ref 6–23)
Potassium: 3.8 mEq/L (ref 3.5–5.3)
Sodium: 138 mEq/L (ref 135–145)
Total Bilirubin: 0.4 mg/dL (ref 0.3–1.2)

## 2012-10-31 LAB — CBC WITH DIFFERENTIAL/PLATELET
Basophils Absolute: 0 10*3/uL (ref 0.0–0.1)
Basophils Relative: 1 % (ref 0–1)
Eosinophils Relative: 7 % — ABNORMAL HIGH (ref 0–5)
Lymphocytes Relative: 35 % (ref 12–46)
MCHC: 34.3 g/dL (ref 30.0–36.0)
Neutro Abs: 1.4 10*3/uL — ABNORMAL LOW (ref 1.7–7.7)
Platelets: 197 10*3/uL (ref 150–400)
RDW: 14 % (ref 11.5–15.5)
WBC: 2.9 10*3/uL — ABNORMAL LOW (ref 4.0–10.5)

## 2012-10-31 LAB — LIPID PANEL
HDL: 67 mg/dL (ref >39)
LDL Cholesterol: 191 mg/dL — ABNORMAL HIGH (ref 0–99)
Total CHOL/HDL Ratio: 4.1 Ratio
Triglycerides: 100 mg/dL (ref <150)
VLDL: 20 mg/dL (ref 0–40)

## 2012-10-31 LAB — POCT GLYCOSYLATED HEMOGLOBIN (HGB A1C): Hemoglobin A1C: 5.7

## 2012-10-31 MED ORDER — TERBINAFINE HCL 250 MG PO TABS: 250.0000 mg | ORAL_TABLET | Freq: Every day | ORAL | Status: DC

## 2012-10-31 MED ORDER — KETOCONAZOLE 2 % EX SHAM: MEDICATED_SHAMPOO | CUTANEOUS | Status: DC

## 2012-10-31 MED ORDER — LISINOPRIL 5 MG PO TABS: 5.0000 mg | ORAL_TABLET | Freq: Every day | ORAL | Status: DC

## 2012-10-31 NOTE — Assessment & Plan Note (Signed)
Improving but persistent.  S/p Diflucan 100mg  daily for three weeks.  Stop Diflucan; start Lamisil therapy.  Refill of Ketoconazole shampoo provided. If persists after Lamisil, will refer to dermatology.

## 2012-10-31 NOTE — Assessment & Plan Note (Signed)
Stable; coping well with stressors; no change in medications.

## 2012-10-31 NOTE — Assessment & Plan Note (Signed)
Stable; coping well; plans to start Tamoxifen once tinea infection resolved.

## 2012-10-31 NOTE — Assessment & Plan Note (Signed)
Controlled; obtain labs; rx for glucometer/test strips/lancets provided.

## 2012-10-31 NOTE — Telephone Encounter (Signed)
Patient's pharmacy called about the prescription lamisil 250 mg, this medication will decrease the the affect of taomifen.  I called the pharmacy Back and told megan the medication has not been started yet, per dr Katrinka Blazing.

## 2012-10-31 NOTE — Progress Notes (Signed)
[address]   [place], [place]  [address]   [phone]  Subjective:    Patient ID: Alisha Matthews, female    DOB: 1950-11-26, 62 y.o.   MRN: [id number]  HPI This 62 y.o. female presents with husband for evaluation/ three month follow-up:  1.  Oral surgery: s/p suture removal this week.  Clearance; no follow-up warranted; doing well.  2.  Anxiety with depression:  Doing really well emotionally; resigned position at [employer]; no insurance as of next week.  After 11/19/12, will not have prescription coverage.  Really at peace currently with life.  No anxiety or depressive symptoms.  3.  Tinea Capitus and corporis: two week follow-up.   Persistent rash and itching along the scalp and lower back.  Has been taking Diflucan orally daily for three weeks.  Needs refill on Ketoconazole shampoo; has been using shampoo regularly. Applying alcohol to scalp with itching. Itching worsens with sweating.   4. Hyperlipidemia:  Three month follow-up; no changes to management made at last visit.  Has been holding statin for three weeks since starting Diflucan; non-fasting; ate french toast at 6:30.    5.  DMII: three month follow-up; no changes to management made at last visit; reports good compliance, good tolerance to medication; good symptom control.  Not checking sugars.   No meter at home; not checking sugars.    6.  L knee OA:  Getting TKR in 02/2013.  Oncologist wants pt to wait six months after radiation therapy to undergo a second surgery/stressor.    7.  Breast cancer:  Has not started Tamoxifen yet.  Follow up with oncology 11/05/12.  S/p radiation therapy.  Doing really well.       Review of Systems  Constitutional: Negative for fever, chills, diaphoresis and fatigue.  Respiratory: Negative for cough, shortness of breath, wheezing and stridor.   Cardiovascular: Negative for chest pain, palpitations and leg swelling.  Gastrointestinal: Negative for nausea, vomiting, abdominal pain and diarrhea.    Musculoskeletal: Positive for arthralgias.  Skin: Positive for rash.  Neurological: Negative for dizziness, tremors, seizures, syncope, speech difficulty, weakness, light-headedness, numbness and headaches.  Psychiatric/Behavioral: Negative for sleep disturbance and dysphoric mood. The patient is not nervous/anxious.     Past Medical History  Diagnosis Date  . Anxiety   . Arthritis   . Diabetes mellitus     borderline  . GERD (gastroesophageal reflux disease)   . Hyperlipidemia   . Insomnia, unspecified   . Leukocytopenia, unspecified   . Barrett's esophagus   . Breast fibrocystic disorder   . Genital warts   . Osteoarthrosis, unspecified whether generalized or localized, ankle and foot     ankle/foot  . Exercise induced bronchospasm   . Personal history of colonic polyps   . Migraine, unspecified, without mention of intractable migraine without mention of status migrainosus   . Symptomatic menopausal or female climacteric states   . Obesity, unspecified   . Essential hypertension, benign   . Depressive disorder, not elsewhere classified   . Anemia, unspecified   . Chicken pox   . Measles   . Mumps   . Breast cancer     right breast invasive ductal carcinoma     Past Surgical History  Procedure Laterality Date  . Cholecystectomy    . Bunionectomy      3 surgeries on both feet  . Nasal septum surgery    . Total hip arthroplasty      left  Perthes disease  . Foot surgery      Left-revision  . Dilation and curettage of uterus  1978  . Breast lumpectomy with needle localization and axillary sentinel lymph node bx  06/08/2012    Procedure: BREAST LUMPECTOMY WITH NEEDLE LOCALIZATION AND AXILLARY SENTINEL LYMPH NODE BX;  Surgeon: Emelia Loron, MD;  Location: [facility];  Service: General;  Laterality: Right;  . Re-excision of breast cancer,superior margins  06/27/2012    Procedure: RE-EXCISION OF BREAST CANCER,SUPERIOR MARGINS;  Surgeon: Emelia Loron, MD;  Location: [facility];  Service: General;  Laterality: Right;    Prior to Admission medications   Medication Sig Start Date End Date Taking? Authorizing Provider  albuterol (PROVENTIL HFA;VENTOLIN HFA) 108 (90 BASE) MCG/ACT inhaler Inhale 2 puffs into the lungs every 6 (six) hours as needed. Asthmatic attack 08/08/12  Yes Ethelda Chick, MD  ALPRAZolam Prudy Feeler) 0.5 MG tablet Take 1 tablet (0.5 mg total) by mouth at bedtime as needed. 08/08/12  Yes Ethelda Chick, MD  cetirizine (ZYRTEC) 10 MG tablet Take 1 tablet (10 mg total) by mouth daily. 10/12/12  Yes Ethelda Chick, MD  clindamycin (CLEOCIN) 150 MG capsule Take 150 mg by mouth as needed. Takes 4 as needed prior to dental appt   Yes Historical Provider, MD  diclofenac sodium (VOLTAREN) 1 % GEL Apply 2 g topically 4 (four) times daily. Joint pain ankle 08/08/12  Yes Ethelda Chick, MD  Diclofenac-Misoprostol (ARTHROTEC PO) Take 1 tablet by mouth 3 (three) times daily.   Yes Historical Provider, MD  diphenhydrAMINE (BENADRYL) 25 MG tablet Take 25 mg by mouth at bedtime as needed. When not taking xanax.   Yes Historical Provider, MD  esomeprazole (NEXIUM) 40 MG capsule Take 1 capsule (40 mg total) by mouth 2 (two) times daily. 08/08/12  Yes Ethelda Chick, MD  fluconazole (DIFLUCAN) 100 MG tablet Take 1 tablet (100 mg total) by mouth daily. 10/23/12  Yes Ethelda Chick, MD  frovatriptan (FROVA) 2.5 MG tablet Take 1 tablet (2.5 mg total) by mouth as needed. If recurs, may repeat after 2 hours. Max of 3 tabs in 24 hours. 08/08/12  Yes Ethelda Chick, MD  ketoconazole (NIZORAL) 2 % shampoo Apply topically 2 (two) times a week. 10/31/12  Yes Ethelda Chick, MD  metFORMIN (GLUMETZA) 500 MG (MOD) 24 hr tablet Take 1 tablet (500 mg total) by mouth daily with breakfast. 08/08/12  Yes Ethelda Chick, MD  miconazole (MICATIN) 2 % cream Apply topically 2 (two) times daily. Apply to affected area twice a day until clear 10/06/12  Yes Lowella Dell, MD  PARoxetine (PAXIL) 40 MG tablet Take 1 tablet (40 mg total) by mouth daily. 08/08/12  Yes Ethelda Chick, MD  atorvastatin (LIPITOR) 20 MG tablet Take 20 mg by mouth daily.    Historical Provider, MD  estradiol (ESTRACE) 0.1 MG/GM vaginal cream Place 2 g vaginally once a week.    Historical Provider, MD  hyaluronate sodium (RADIAPLEXRX) GEL Apply topically 2 (two) times daily.    Historical Provider, MD  hydrOXYzine (ATARAX/VISTARIL) 25 MG tablet Take 0.5-1 tablets (12.5-25 mg total) by mouth every 8 (eight) hours as needed for itching. 10/07/12   Ethelda Chick, MD  lisinopril (PRINIVIL,ZESTRIL) 5 MG tablet Take 1 tablet (5 mg total) by mouth daily. 10/31/12   Ethelda Chick, MD  non-metallic deodorant Thornton Papas) MISC Apply 1 application topically daily as needed.    Historical  Provider, MD  tamoxifen (NOLVADEX) 20 MG tablet Take 1 tablet (20 mg total) by mouth daily. 10/06/12   Lowella Dell, MD  terbinafine (LAMISIL) 250 MG tablet Take 1 tablet (250 mg total) by mouth daily. 10/31/12   Ethelda Chick, MD    Allergies  Allergen Reactions  . Penicillins Rash    History   Social History  . Marital Status: Married    Spouse Name: N/A    Number of Children: 0  . Years of Education: college   Occupational History  . medical assistant [employer]   Social History Main Topics  . Smoking status: Never Smoker   . Smokeless tobacco: Never Used  . Alcohol Use: 0.5 oz/week    1 drink(s) per week     Comment: 1-2 glasses of wine a year  . Drug Use: No  . Sexually Active: Yes -- Female partner(s)     Comment: post menopausal   Other Topics Concern  . Not on file   Social History Narrative   Marital status:  Married x 17 years happily, second marriage. No domestic abuse; 5 cats; no children.      Lives: with husband, 5 cats.      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      Tobacco: none       Alcohol: none      Drugs: none      Exercise:  Stair stepper  x 15 minutes five days per week.  Spin gym with weights.     Moderate caffeine use.     Family History  Problem Relation Age of Onset  . Adopted: Yes  . Hypertension Mother   . Diabetes Mother   . Heart failure Mother   . Cancer Maternal Grandmother 51    non hodgkins lymphoma       Objective:   Physical Exam  Nursing note and vitals reviewed. Constitutional: She appears well-developed and well-nourished. No distress.  Eyes: Conjunctivae are normal. Pupils are equal, round, and reactive to light.  Neck: Normal range of motion. Neck supple. No thyromegaly present.  Cardiovascular: Normal rate, regular rhythm, normal heart sounds and intact distal pulses.  Exam reveals no gallop and no friction rub.   No murmur heard. Pulmonary/Chest: Effort normal and breath sounds normal. No respiratory distress. She has no wheezes. She has no rales.  Abdominal: Soft. Bowel sounds are normal. She exhibits no distension and no mass. There is no tenderness. There is no rebound and no guarding.  Lymphadenopathy:    She has no cervical adenopathy.  Skin: Rash noted. She is not diaphoretic.  Posterior scalp with faint erythematous rash L occipital region> R occipital region. Lower back with erythematous raised scaling rash annular with central clearing 8 cm x 9 cm.  Psychiatric: She has a normal mood and affect. Her behavior is normal. Judgment and thought content normal.       Assessment & Plan:  Type II or unspecified type diabetes mellitus without mention of complication, not stated as uncontrolled - Plan: CBC with Differential, Comprehensive metabolic panel, lisinopril (PRINIVIL,ZESTRIL) 5 MG tablet, POCT glycosylated hemoglobin (Hb A1C), CANCELED: Hemoglobin A1C  Pure hypercholesterolemia - Plan: CK, Lipid panel  Tinea capitis - Plan: ketoconazole (NIZORAL) 2 % shampoo, terbinafine (LAMISIL) 250 MG tablet  Anxiety and depression   Meds ordered this encounter  Medications  . atorvastatin  (LIPITOR) 20 MG tablet    Sig: Take  20 mg by mouth daily.  Marland Kitchen ketoconazole (NIZORAL) 2 % shampoo    Sig: Apply topically 2 (two) times a week.    Dispense:  120 mL    Refill:  1  . lisinopril (PRINIVIL,ZESTRIL) 5 MG tablet    Sig: Take 1 tablet (5 mg total) by mouth daily.    Dispense:  90 tablet    Refill:  3    PLEASE REFILL EARLY; PT LOST RX.  . terbinafine (LAMISIL) 250 MG tablet    Sig: Take 1 tablet (250 mg total) by mouth daily.    Dispense:  30 tablet    Refill:  0

## 2012-10-31 NOTE — Assessment & Plan Note (Signed)
Stable; holding Lipitor while taking Diflucan; obtain labs.

## 2012-11-01 ENCOUNTER — Telehealth: Payer: Self-pay | Admitting: Radiation Oncology

## 2012-11-01 NOTE — Telephone Encounter (Signed)
Faxed out of work letter to [facility] [employer] 10/31/12.  Received confirmation.

## 2012-11-08 ENCOUNTER — Encounter: Payer: Self-pay | Admitting: Family Medicine

## 2012-11-15 ENCOUNTER — Encounter: Payer: Self-pay | Admitting: Family Medicine

## 2012-11-18 ENCOUNTER — Encounter: Payer: Self-pay | Admitting: Family Medicine

## 2012-11-18 ENCOUNTER — Encounter (INDEPENDENT_AMBULATORY_CARE_PROVIDER_SITE_OTHER): Payer: Self-pay | Admitting: General Surgery

## 2012-11-18 ENCOUNTER — Ambulatory Visit (INDEPENDENT_AMBULATORY_CARE_PROVIDER_SITE_OTHER): Payer: 59 | Admitting: Family Medicine

## 2012-11-18 VITALS — BP 127/78 | HR 73 | Temp 98.8°F | Resp 16 | Ht 62.2 in | Wt 148.0 lb

## 2012-11-18 DIAGNOSIS — B350 Tinea barbae and tinea capitis: Secondary | ICD-10-CM

## 2012-11-18 DIAGNOSIS — D72819 Decreased white blood cell count, unspecified: Secondary | ICD-10-CM

## 2012-11-18 DIAGNOSIS — D649 Anemia, unspecified: Secondary | ICD-10-CM

## 2012-11-18 DIAGNOSIS — E119 Type 2 diabetes mellitus without complications: Secondary | ICD-10-CM

## 2012-11-18 LAB — POCT CBC
Granulocyte percent: 58 %G (ref 37–80)
MCH, POC: 28.5 pg (ref 27–31.2)
MCHC: 30.8 g/dL — AB (ref 31.8–35.4)
MCV: 92.4 fL (ref 80–97)
MPV: 8.2 fL (ref 0–99.8)
POC Granulocyte: 1.4 — AB (ref 2–6.9)
Platelet Count, POC: 149 10*3/uL (ref 142–424)
RBC: 3.61 M/uL — AB (ref 4.04–5.48)
RDW, POC: 14.1 %

## 2012-11-18 LAB — IFOBT (OCCULT BLOOD): IFOBT: POSITIVE

## 2012-11-18 MED ORDER — PAROXETINE HCL 40 MG PO TABS: 40.0000 mg | ORAL_TABLET | Freq: Every day | ORAL | Status: DC

## 2012-11-18 MED ORDER — ALPRAZOLAM 0.5 MG PO TABS: 0.5000 mg | ORAL_TABLET | Freq: Every evening | ORAL | Status: DC | PRN

## 2012-11-18 MED ORDER — ATORVASTATIN CALCIUM 20 MG PO TABS: 20.0000 mg | ORAL_TABLET | Freq: Every day | ORAL | Status: DC

## 2012-11-18 MED ORDER — ALBUTEROL SULFATE HFA 108 (90 BASE) MCG/ACT IN AERS: 2.0000 | INHALATION_SPRAY | Freq: Four times a day (QID) | RESPIRATORY_TRACT | Status: DC | PRN

## 2012-11-18 MED ORDER — ESOMEPRAZOLE MAGNESIUM 40 MG PO CPDR: 40.0000 mg | DELAYED_RELEASE_CAPSULE | Freq: Two times a day (BID) | ORAL | Status: DC

## 2012-11-18 MED ORDER — METFORMIN HCL ER (MOD) 500 MG PO TB24: 500.0000 mg | ORAL_TABLET | Freq: Every day | ORAL | Status: DC

## 2012-11-18 MED ORDER — HYDROCORTISONE ACETATE 25 MG RE SUPP: 25.0000 mg | Freq: Two times a day (BID) | RECTAL | Status: DC

## 2012-11-18 MED ORDER — FROVATRIPTAN SUCCINATE 2.5 MG PO TABS: 2.5000 mg | ORAL_TABLET | ORAL | Status: DC | PRN

## 2012-11-18 NOTE — Progress Notes (Signed)
[address]   [place], [place]  [address]   [phone]  Subjective:    Patient ID: Alisha Matthews, female    DOB: October 06, 1950, 62 y.o.   MRN: [id number]  HPI This 62 y.o. female presents for two week follow-up:  1.  Leukocytopenia: feels much better; lost of energy; no chills/sweats/fevers.  Sleeping well.  Energy level great.  Exercised today.    2.  Anemia:  Presenting for repeat labs.  Feels better; energy level has increased; denies bloody stools, melena.  Denies n/v/d/c.  No change in bowel habits. Has had a little rectal bleeding from a hemorrhoid or anal fissure?  3.  Tinea capitus and corporis:  Two week follow-up; taking Lamisil daily for two weeks.  Lymph nodes much improved.  Onset of rash posterior scalp.  Then rash really took off after second surgery in January 2014.  One week, noticed that scalp spongy and inflamed from half scalp and back. Mentioned to [place] at visit.  Also showed him lower back rash; takes one look and diagnosed with fungal infection x 10 days.  Recommended follow-up with fungal infection.  Scalp really took off.  LFTs normal at last visit.  4.  Hypercholesterolemia:  Has been holding statin.  Cholesterol very elevated two weeks ago.   Review of Systems  Constitutional: Negative for fever, chills, diaphoresis and fatigue.  Gastrointestinal: Negative for nausea, vomiting, abdominal pain, diarrhea and constipation.  Skin: Positive for color change, rash and wound.  Hematological: Negative for adenopathy. Does not bruise/bleed easily.   Past Medical History  Diagnosis Date  . Anxiety   . Arthritis   . Diabetes mellitus     borderline  . GERD (gastroesophageal reflux disease)   . Hyperlipidemia   . Insomnia, unspecified   . Leukocytopenia, unspecified   . Barrett's esophagus   . Breast fibrocystic disorder   . Genital warts   . Osteoarthrosis, unspecified whether generalized or localized, ankle and foot     ankle/foot  . Exercise induced  bronchospasm   . Personal history of colonic polyps   . Migraine, unspecified, without mention of intractable migraine without mention of status migrainosus   . Symptomatic menopausal or female climacteric states   . Obesity, unspecified   . Essential hypertension, benign   . Depressive disorder, not elsewhere classified   . Anemia, unspecified   . Chicken pox   . Measles   . Mumps   . Breast cancer     right breast invasive ductal carcinoma    Current Outpatient Prescriptions on File Prior to Visit  Medication Sig Dispense Refill  . cetirizine (ZYRTEC) 10 MG tablet Take 1 tablet (10 mg total) by mouth daily.  30 tablet  11  . clindamycin (CLEOCIN) 150 MG capsule Take 150 mg by mouth as needed. Takes 4 as needed prior to dental appt      . diclofenac sodium (VOLTAREN) 1 % GEL Apply 2 g topically 4 (four) times daily. Joint pain ankle  100 g  11  . Diclofenac-Misoprostol (ARTHROTEC PO) Take 1 tablet by mouth 3 (three) times daily.      . diphenhydrAMINE (BENADRYL) 25 MG tablet Take 25 mg by mouth at bedtime as needed. When not taking xanax.      Marland Kitchen ketoconazole (NIZORAL) 2 % shampoo Apply topically 2 (two) times a week.  120 mL  1  . lisinopril (PRINIVIL,ZESTRIL) 5 MG tablet Take 1 tablet (5 mg total) by mouth daily.  90 tablet  3  . miconazole (MICATIN) 2 % cream Apply topically 2 (two) times daily. Apply to affected area twice a day until clear  28.35 g  0  . terbinafine (LAMISIL) 250 MG tablet Take 1 tablet (250 mg total) by mouth daily.  30 tablet  0  . estradiol (ESTRACE) 0.1 MG/GM vaginal cream Place 2 g vaginally once a week.      . fluconazole (DIFLUCAN) 100 MG tablet Take 1 tablet (100 mg total) by mouth daily.  10 tablet  0  . hyaluronate sodium (RADIAPLEXRX) GEL Apply topically 2 (two) times daily.      . hydrOXYzine (ATARAX/VISTARIL) 25 MG tablet Take 0.5-1 tablets (12.5-25 mg total) by mouth every 8 (eight) hours as needed for itching.  30 tablet  0  . non-metallic deodorant  (ALRA) MISC Apply 1 application topically daily as needed.      . tamoxifen (NOLVADEX) 20 MG tablet Take 1 tablet (20 mg total) by mouth daily.  90 tablet  12   No current facility-administered medications on file prior to visit.       Objective:   Physical Exam  Nursing note and vitals reviewed. Constitutional: She is oriented to person, place, and time. She appears well-developed and well-nourished. No distress.  Neck: Neck supple. No thyromegaly present.  Cardiovascular: Normal rate, regular rhythm and normal heart sounds.   Pulmonary/Chest: Effort normal and breath sounds normal.  Genitourinary: Rectal exam shows fissure and tenderness. Rectal exam shows no mass.  Lymphadenopathy:    She has no cervical adenopathy.  Neurological: She is alert and oriented to person, place, and time.  Skin: No rash noted. She is not diaphoretic.  Very minimal rash posterior occipital region of scalp.  Lower back with very scant evidence of erythema at site of previous rash.  Psychiatric: She has a normal mood and affect. Her behavior is normal.   Results for orders placed in visit on 11/18/12  COMPREHENSIVE METABOLIC PANEL      Result Value Range   Sodium 137  135 - 145 mEq/L   Potassium 3.7  3.5 - 5.3 mEq/L   Chloride 103  96 - 112 mEq/L   CO2 22  19 - 32 mEq/L   Glucose, Bld 171 (*) 70 - 99 mg/dL   BUN 9  6 - 23 mg/dL   Creat 4.69  6.29 - 5.28 mg/dL   Total Bilirubin 0.3  0.3 - 1.2 mg/dL   Alkaline Phosphatase 53  39 - 117 U/L   AST 33  0 - 37 U/L   ALT 30  0 - 35 U/L   Total Protein 5.7 (*) 6.0 - 8.3 g/dL   Albumin 3.7  3.5 - 5.2 g/dL   Calcium 8.9  8.4 - 41.3 mg/dL  ANA      Result Value Range   ANA NEG  NEGATIVE  SEDIMENTATION RATE      Result Value Range   Sed Rate 6  0 - 22 mm/hr  IRON      Result Value Range   Iron 75  42 - 145 ug/dL  VITAMIN K44      Result Value Range   Vitamin B-12 1215 (*) 211 - 911 pg/mL  POCT CBC      Result Value Range   WBC 2.5 (*) 4.6 - 10.2  K/uL   Lymph, poc 0.8  0.6 - 3.4   POC LYMPH PERCENT 33.6  10 - 50 %L   MID (cbc) 0.2  0 -  0.9   POC MID % 8.4  0 - 12 %M   POC Granulocyte 1.4 (*) 2 - 6.9   Granulocyte percent 58.0  37 - 80 %G   RBC 3.61 (*) 4.04 - 5.48 M/uL   Hemoglobin 10.3 (*) 12.2 - 16.2 g/dL   HCT, POC 16.1 (*) 09.6 - 47.9 %   MCV 92.4  80 - 97 fL   MCH, POC 28.5  27 - 31.2 pg   MCHC 30.8 (*) 31.8 - 35.4 g/dL   RDW, POC 04.5     Platelet Count, POC 149  142 - 424 K/uL   MPV 8.2  0 - 99.8 fL  IFOBT (OCCULT BLOOD)      Result Value Range   IFOBT Positive         Assessment & Plan:  Anemia, unspecified - Plan: Comprehensive metabolic panel, POCT CBC, IFOBT POC (occult bld, rslt in office), CANCELED: CBC with Differential  Type II or unspecified type diabetes mellitus without mention of complication, not stated as uncontrolled - Plan: Comprehensive metabolic panel, CANCELED: CBC with Differential  Leukocytopenia, unspecified - Plan: ANA, Sedimentation rate, Iron, Vitamin B12, CANCELED: IBC Panel  Tinea capitis  1. Anemia:  Worsening; obtain iron levels and B12 levels; hemosure + but currently with anal fissure versus hemorrhoid that has been bleeding.  If anemia quickly worsens further, will warrant GI referral but considering acute issues with anal irritation, will defer; close follow-up warranted. 2, Leukocytopenia: worsening; will increase protein intake; obtain autoimmune labs to rule out secondary contributing factors; asymptomatic; close follow-up. If no improvement in upcoming weeks, will warrant referral to hematology. 3. Tinea Corporis and capitis: improving.  Advise to stop Lamisil after this week.  Lamisil may be contributing to anemia, leukocytopenia. 4. DMII: stable.  No change in management. 5.  Hypercholesterolemia: holding statin while taking Lamisil; advised to restart statin as soon as completes Lamisil therapy.

## 2012-11-19 LAB — COMPREHENSIVE METABOLIC PANEL
AST: 33 U/L (ref 0–37)
Albumin: 3.7 g/dL (ref 3.5–5.2)
Alkaline Phosphatase: 53 U/L (ref 39–117)
Glucose, Bld: 171 mg/dL — ABNORMAL HIGH (ref 70–99)
Potassium: 3.7 mEq/L (ref 3.5–5.3)
Sodium: 137 mEq/L (ref 135–145)
Total Protein: 5.7 g/dL — ABNORMAL LOW (ref 6.0–8.3)

## 2012-11-21 ENCOUNTER — Telehealth: Payer: Self-pay | Admitting: *Deleted

## 2012-11-21 LAB — ANA: Anti Nuclear Antibody(ANA): NEGATIVE

## 2012-11-21 NOTE — Telephone Encounter (Signed)
Patient called to ask about her recent lab results. She was really worried about her ANA and sed rate. Patient states she cant access her mychart. I looked at her labs and saw that the ana and sed rate were normal, so I let her know that. Patient requests copy of her labs sent to her home.

## 2012-11-22 ENCOUNTER — Telehealth: Payer: Self-pay | Admitting: Oncology

## 2012-11-23 ENCOUNTER — Telehealth: Payer: Self-pay | Admitting: Oncology

## 2012-11-23 ENCOUNTER — Telehealth: Payer: Self-pay | Admitting: *Deleted

## 2012-11-23 NOTE — Telephone Encounter (Signed)
Pt called and wanted Dr. Katrinka Blazing to know that she is still taking the Lamisil daily and did not stop on Monday because her scalp is still itching.  She has some itchy spots around the band of her hairline.  She states she will take until Friday.  Also her husband has been putting clotramizole cream also around her hairline to see if it would help.  She has been using the ketoconozole shampoo every other day in scalp and low back and her back has cleared quicker while using the shampoo and taking Lamisil, so she figures if she uses the clotramizole and Lamisil this should maybe clear her hairline.  She also wanted you to know that she has been taking her statin every other day.  She would like any input you have on this and call her back.

## 2012-11-24 NOTE — Telephone Encounter (Signed)
Patient advised.

## 2012-11-24 NOTE — Telephone Encounter (Signed)
Thanks, I have called her to advise. Left message with her husband to have her call me back.

## 2012-11-24 NOTE — Telephone Encounter (Signed)
1.  OK to stop Lamisil on Friday.  2.  Agreeable with using Clotramizole cream and Ketoconazole shampoo as currently using.  3.  Also agreeable with taking stating every other day but would start taking daily when she stops Lamisil.

## 2012-11-30 ENCOUNTER — Encounter: Payer: Self-pay | Admitting: Family Medicine

## 2012-12-02 ENCOUNTER — Telehealth: Payer: Self-pay

## 2012-12-02 NOTE — Telephone Encounter (Signed)
Call --- this should be fine; this will be three weeks after last blood work (which was obtained on 5/30).  I will see her around 12/12/12.

## 2012-12-02 NOTE — Telephone Encounter (Signed)
Patient called stating because of new [employer] she is unable to come for lab work until June 23rd.  She asked that Dr Katrinka Blazing be notified.  Message forwarded to Dr. Katrinka Blazing.

## 2012-12-03 NOTE — Telephone Encounter (Signed)
Spoke with pt advised Dr Katrinka Blazing message. Pt understood.

## 2012-12-05 ENCOUNTER — Ambulatory Visit: Payer: 59 | Admitting: Family Medicine

## 2012-12-12 ENCOUNTER — Ambulatory Visit (INDEPENDENT_AMBULATORY_CARE_PROVIDER_SITE_OTHER): Payer: 59 | Admitting: Family Medicine

## 2012-12-12 VITALS — BP 123/81 | HR 69 | Temp 98.3°F | Resp 16 | Ht 63.0 in | Wt 148.0 lb

## 2012-12-12 DIAGNOSIS — R42 Dizziness and giddiness: Secondary | ICD-10-CM

## 2012-12-12 DIAGNOSIS — I959 Hypotension, unspecified: Secondary | ICD-10-CM

## 2012-12-12 DIAGNOSIS — D649 Anemia, unspecified: Secondary | ICD-10-CM

## 2012-12-12 DIAGNOSIS — R21 Rash and other nonspecific skin eruption: Secondary | ICD-10-CM

## 2012-12-12 DIAGNOSIS — D72819 Decreased white blood cell count, unspecified: Secondary | ICD-10-CM

## 2012-12-12 DIAGNOSIS — B350 Tinea barbae and tinea capitis: Secondary | ICD-10-CM

## 2012-12-12 LAB — POCT CBC
Granulocyte percent: 54.5 %G (ref 37–80)
MCH, POC: 29.5 pg (ref 27–31.2)
MID (cbc): 0.2 (ref 0–0.9)
MPV: 7.6 fL (ref 0–99.8)
POC MID %: 8 %M (ref 0–12)
Platelet Count, POC: 153 10*3/uL (ref 142–424)
RBC: 4 M/uL — AB (ref 4.04–5.48)
RDW, POC: 13.5 %
WBC: 3 10*3/uL — AB (ref 4.6–10.2)

## 2012-12-12 LAB — COMPREHENSIVE METABOLIC PANEL
ALT: 27 U/L (ref 0–35)
AST: 28 U/L (ref 0–37)
Alkaline Phosphatase: 54 U/L (ref 39–117)
Calcium: 9.1 mg/dL (ref 8.4–10.5)
Chloride: 102 mEq/L (ref 96–112)
Creat: 0.75 mg/dL (ref 0.50–1.10)

## 2012-12-12 NOTE — Progress Notes (Signed)
[address]   [place], [place]  [address]   [phone]  Subjective:    Patient ID: Alisha Matthews, female    DOB: 10-23-1950, 62 y.o.   MRN: [id number]  HPI This 62 y.o. female presents for evaluation of the following:  1.  Leukocytopenia: presenting for repeat WBC count; has increased amount of protein in diet.  2.  Anemia:  Presenting for repeat H/H. Feeling great.  Eating more protein.  3. Rash: rash now resolved; minimal scalp itching; erythema of scalp has resolved.    4. Hypercholesterolemia: to restart statin therapy.  5. HTN: taking Lisinopril; has been a little dizzy; has checked BP and running a bit low.  Review of Systems  Constitutional: Negative for fever, chills, diaphoresis, activity change, appetite change, fatigue and unexpected weight change.  Gastrointestinal: Negative for nausea, vomiting, abdominal pain, diarrhea, constipation, blood in stool, abdominal distention and anal bleeding.  Endocrine: Negative for cold intolerance, heat intolerance, polydipsia, polyphagia and polyuria.  Skin: Negative for color change and rash.  Neurological: Negative for dizziness and light-headedness.  Hematological: Negative for adenopathy. Does not bruise/bleed easily.   Past Medical History  Diagnosis Date  . Anxiety   . Arthritis   . Diabetes mellitus     borderline  . GERD (gastroesophageal reflux disease)   . Hyperlipidemia   . Insomnia, unspecified   . Leukocytopenia, unspecified   . Barrett's esophagus   . Breast fibrocystic disorder   . Genital warts   . Osteoarthrosis, unspecified whether generalized or localized, ankle and foot     ankle/foot  . Exercise induced bronchospasm   . Personal history of colonic polyps   . Migraine, unspecified, without mention of intractable migraine without mention of status migrainosus   . Symptomatic menopausal or female climacteric states   . Obesity, unspecified   . Essential hypertension, benign   . Depressive disorder, not  elsewhere classified   . Anemia, unspecified   . Chicken pox   . Measles   . Mumps   . Breast cancer     right breast invasive ductal carcinoma    Current Outpatient Prescriptions on File Prior to Visit  Medication Sig Dispense Refill  . albuterol (PROVENTIL HFA;VENTOLIN HFA) 108 (90 BASE) MCG/ACT inhaler Inhale 2 puffs into the lungs every 6 (six) hours as needed. Asthmatic attack  18 g  3  . ALPRAZolam (XANAX) 0.5 MG tablet Take 1 tablet (0.5 mg total) by mouth at bedtime as needed.  30 tablet  5  . atorvastatin (LIPITOR) 20 MG tablet Take 1 tablet (20 mg total) by mouth daily.  90 tablet  3  . cetirizine (ZYRTEC) 10 MG tablet Take 1 tablet (10 mg total) by mouth daily.  30 tablet  11  . diclofenac sodium (VOLTAREN) 1 % GEL Apply 2 g topically 4 (four) times daily. Joint pain ankle  100 g  11  . Diclofenac-Misoprostol (ARTHROTEC PO) Take 1 tablet by mouth 3 (three) times daily.      . diphenhydrAMINE (BENADRYL) 25 MG tablet Take 25 mg by mouth at bedtime as needed. When not taking xanax.      . esomeprazole (NEXIUM) 40 MG capsule Take 1 capsule (40 mg total) by mouth 2 (two) times daily.  180 capsule  3  . frovatriptan (FROVA) 2.5 MG tablet Take 1 tablet (2.5 mg total) by mouth as needed. If recurs, may repeat after 2 hours. Max of 3 tabs in 24 hours.  10 tablet  2  .  hydrocortisone (ANUSOL-HC) 25 MG suppository Place 1 suppository (25 mg total) rectally 2 (two) times daily.  24 suppository  0  . metFORMIN (GLUMETZA) 500 MG (MOD) 24 hr tablet Take 1 tablet (500 mg total) by mouth daily with breakfast.  90 tablet  3  . PARoxetine (PAXIL) 40 MG tablet Take 1 tablet (40 mg total) by mouth daily.  90 tablet  3  . clindamycin (CLEOCIN) 150 MG capsule Take 150 mg by mouth as needed. Takes 4 as needed prior to dental appt      . estradiol (ESTRACE) 0.1 MG/GM vaginal cream Place 2 g vaginally once a week.      . fluconazole (DIFLUCAN) 100 MG tablet Take 1 tablet (100 mg total) by mouth daily.  10  tablet  0  . hyaluronate sodium (RADIAPLEXRX) GEL Apply topically 2 (two) times daily.      . hydrOXYzine (ATARAX/VISTARIL) 25 MG tablet Take 0.5-1 tablets (12.5-25 mg total) by mouth every 8 (eight) hours as needed for itching.  30 tablet  0  . ketoconazole (NIZORAL) 2 % shampoo Apply topically 2 (two) times a week.  120 mL  1  . lisinopril (PRINIVIL,ZESTRIL) 5 MG tablet Take 1 tablet (5 mg total) by mouth daily.  90 tablet  3  . miconazole (MICATIN) 2 % cream Apply topically 2 (two) times daily. Apply to affected area twice a day until clear  28.35 g  0  . non-metallic deodorant (ALRA) MISC Apply 1 application topically daily as needed.      . tamoxifen (NOLVADEX) 20 MG tablet Take 1 tablet (20 mg total) by mouth daily.  90 tablet  12  . terbinafine (LAMISIL) 250 MG tablet Take 1 tablet (250 mg total) by mouth daily.  30 tablet  0   No current facility-administered medications on file prior to visit.       Objective:   Physical Exam  Nursing note and vitals reviewed. Constitutional: She appears well-developed and well-nourished. No distress.  HENT:  Head: Normocephalic and atraumatic.  Eyes: Conjunctivae and EOM are normal. Pupils are equal, round, and reactive to light.  Neck: Normal range of motion. Neck supple. No thyromegaly present.  Cardiovascular: Normal rate, regular rhythm and normal heart sounds.   Pulmonary/Chest: Effort normal and breath sounds normal.  Abdominal: Soft. Bowel sounds are normal. She exhibits no distension. There is no tenderness. There is no rebound and no guarding.  Lymphadenopathy:    She has no cervical adenopathy.  Skin: No rash noted. She is not diaphoretic. No erythema.  Scalp without erythema, scaling.  Lower back without erythematous scaling rash.  Psychiatric: She has a normal mood and affect. Her behavior is normal.          Results for orders placed in visit on 12/12/12  POCT CBC      Result Value Range   WBC 3.0 (*) 4.6 - 10.2 K/uL    Lymph, poc 1.1  0.6 - 3.4   POC LYMPH PERCENT 37.5  10 - 50 %L   MID (cbc) 0.2  0 - 0.9   POC MID % 8.0  0 - 12 %M   POC Granulocyte 1.6 (*) 2 - 6.9   Granulocyte percent 54.5  37 - 80 %G   RBC 4.00 (*) 4.04 - 5.48 M/uL   Hemoglobin 11.8 (*) 12.2 - 16.2 g/dL   HCT, POC 16.1 (*) 09.6 - 47.9 %   MCV 93.1  80 - 97 fL   MCH, POC 29.5  27 - 31.2 pg   MCHC 31.7 (*) 31.8 - 35.4 g/dL   RDW, POC 40.9     Platelet Count, POC 153  142 - 424 K/uL   MPV 7.6  0 - 99.8 fL    Assessment & Plan:  Anemia - Plan: POCT CBC  Leukocytopenia, unspecified  Hypotension, unspecified  Dizziness - Plan: Comprehensive metabolic panel  Tinea capitis  Rash and nonspecific skin eruption  1. Anemia: improved slightly but persistent.  Repeat in upcoming 4-6 weeks. 2.  Leukocytopenia: improved but persistent; repeat in 4-6 weeks. 3.  Hypotension: New.  With dizziness; recommend decreasing Lisinopril to 1/2 tablet or holding Lisinopril. 4.  Dizziness;  New. Associated with borderline low BP; obtain labs.   5.  Tinea Capitis: improved and nearly resolved.  6. Tinea corporis: resolved.

## 2012-12-14 ENCOUNTER — Encounter: Payer: Self-pay | Admitting: Family Medicine

## 2013-01-03 ENCOUNTER — Ambulatory Visit: Payer: 59 | Admitting: Oncology

## 2013-01-20 ENCOUNTER — Encounter (INDEPENDENT_AMBULATORY_CARE_PROVIDER_SITE_OTHER): Payer: Self-pay | Admitting: General Surgery

## 2013-01-20 ENCOUNTER — Ambulatory Visit (INDEPENDENT_AMBULATORY_CARE_PROVIDER_SITE_OTHER): Payer: Commercial Managed Care - PPO | Admitting: General Surgery

## 2013-01-20 VITALS — BP 138/72 | HR 72 | Temp 97.8°F | Resp 15 | Ht 64.0 in | Wt 152.2 lb

## 2013-01-20 DIAGNOSIS — C50219 Malignant neoplasm of upper-inner quadrant of unspecified female breast: Secondary | ICD-10-CM

## 2013-01-20 DIAGNOSIS — Z0271 Encounter for disability determination: Secondary | ICD-10-CM

## 2013-01-20 DIAGNOSIS — C50211 Malignant neoplasm of upper-inner quadrant of right female breast: Secondary | ICD-10-CM

## 2013-01-22 NOTE — Progress Notes (Signed)
Subjective:     Patient ID: Alisha Matthews, female   DOB: 09/26/1950, 62 y.o.   MRN: [id number]  HPI 57 yof who underwent left lumpectomy/sn followed by re-excision of margins.  She the completed radiation therapy.  She began tamoxifen but stopped after about a month due to the side effects.  She has no real complaints referable to her breasts except some soreness.  Her arm is doing well without swelling.    Review of Systems     Objective:   Physical Exam  Vitals reviewed. Constitutional: She appears well-developed and well-nourished.  Pulmonary/Chest: Right breast exhibits no inverted nipple, no mass, no nipple discharge, no skin change and no tenderness. Left breast exhibits no inverted nipple, no mass, no nipple discharge, no skin change and no tenderness.    Lymphadenopathy:    She has no cervical adenopathy.       Assessment:     Left breast cancer     Plan:     She has no clinical evidence of recurrence. She has healed well.  She will continue monthly self exams, every six months clinical exam and yearly mm that will be due in 11/14.  She will see me in one year or sooner if needed

## 2013-01-30 ENCOUNTER — Encounter: Payer: Self-pay | Admitting: Family Medicine

## 2013-01-30 ENCOUNTER — Ambulatory Visit (INDEPENDENT_AMBULATORY_CARE_PROVIDER_SITE_OTHER): Payer: 59 | Admitting: Family Medicine

## 2013-01-30 VITALS — BP 116/67 | HR 70 | Temp 98.3°F | Resp 16 | Ht 62.4 in | Wt 154.0 lb

## 2013-01-30 DIAGNOSIS — E7800 Pure hypercholesterolemia, unspecified: Secondary | ICD-10-CM

## 2013-01-30 DIAGNOSIS — D72819 Decreased white blood cell count, unspecified: Secondary | ICD-10-CM

## 2013-01-30 DIAGNOSIS — D649 Anemia, unspecified: Secondary | ICD-10-CM

## 2013-01-30 DIAGNOSIS — E119 Type 2 diabetes mellitus without complications: Secondary | ICD-10-CM

## 2013-01-30 DIAGNOSIS — B350 Tinea barbae and tinea capitis: Secondary | ICD-10-CM

## 2013-01-30 LAB — POCT CBC
Granulocyte percent: 44 %G (ref 37–80)
MCHC: 31.6 g/dL — AB (ref 31.8–35.4)
MID (cbc): 0.2 (ref 0–0.9)
MPV: 8.7 fL (ref 0–99.8)
POC Granulocyte: 1.1 — AB (ref 2–6.9)
POC LYMPH PERCENT: 49.3 %L (ref 10–50)
POC MID %: 6.7 %M (ref 0–12)
Platelet Count, POC: 139 10*3/uL — AB (ref 142–424)
RDW, POC: 14.2 %

## 2013-01-30 LAB — LIPID PANEL
Cholesterol: 152 mg/dL (ref 0–200)
Triglycerides: 71 mg/dL (ref <150)

## 2013-01-30 LAB — HEMOGLOBIN A1C: Hgb A1c MFr Bld: 5.6 % (ref <5.7)

## 2013-01-30 LAB — COMPREHENSIVE METABOLIC PANEL
BUN: 8 mg/dL (ref 6–23)
CO2: 28 mEq/L (ref 19–32)
Calcium: 9 mg/dL (ref 8.4–10.5)
Chloride: 108 mEq/L (ref 96–112)
Creat: 0.67 mg/dL (ref 0.50–1.10)

## 2013-01-30 MED ORDER — DICLOFENAC-MISOPROSTOL 50-0.2 MG PO TBEC: 1.0000 | DELAYED_RELEASE_TABLET | Freq: Three times a day (TID) | ORAL | Status: DC

## 2013-01-30 MED ORDER — LISINOPRIL 5 MG PO TABS: 5.0000 mg | ORAL_TABLET | Freq: Every day | ORAL | Status: DC

## 2013-01-30 NOTE — Progress Notes (Signed)
[address]   [place], [place]  [address]   [phone]  Subjective:    Patient ID: Alisha Matthews, female    DOB: 1950/12/24, 62 y.o.   MRN: [id number]  HPI This 62 y.o. female presents for six week follow-up:  1.  Breast cancer: started Tamoxifen on 12/26/12; developed sharp calf L pain.  Has varicose veins in legs.  Worried about DVT with Tamoxifen.  Worried about sluggish blood flow.  Was massaging calf really hard; has bruising on L calf.  Applied warm compresses; took five ASA 81mg  for clot prevention. On 01/27/13, could not take it anymore; worrying a lot about clot formation.  With exercise, pani subsided.  Has stopped Tamoxifen for three days; pain stopped.  Took medication for one month.  Had peculiar vaginal odor.  Eating really healthy; lots of vegetables and fruits.  Eating much healthier.  Follow-up with oncology 02/21/13.  Follow up with [facility]/General [facility]; bulge present that is either seroma or scar tissue.  Has suffered with breast pain which is typical for post-radiation pain.    2.   Tinea corporis and capitis:  Itching has resolved finally.    3.  Anemia:  Due for repeat labs; denies n/v/d/c; denies melena or bloody stools.    4.  Leukocytopenia:  WBC decreased to 1.8 twenty-five years ago.  Eating more protein now.  Did start Tamoxifen for one month; ?side effect to Tamoxifen.    5.  HTN:  Started Lisinopril again.  Needs refill.  Husband checking BP mornings and evenings; running 120s/80s.  Decreased to 110/78 on Lisinopril.  6. Hyperlipidemia: restarted statin; fasting today; compliance with medication. Review of Systems  Constitutional: Negative for fever, chills, diaphoresis, activity change, appetite change and fatigue.  Respiratory: Negative for shortness of breath, wheezing and stridor.   Cardiovascular: Negative for chest pain, palpitations and leg swelling.  Gastrointestinal: Negative for nausea, vomiting, abdominal pain, diarrhea, constipation, blood in stool,  abdominal distention, anal bleeding and rectal pain.  Endocrine: Negative for polydipsia, polyphagia and polyuria.  Neurological: Negative for dizziness, tremors, seizures, syncope, facial asymmetry, speech difficulty, weakness, light-headedness, numbness and headaches.  Hematological: Does not bruise/bleed easily.  Psychiatric/Behavioral: Positive for sleep disturbance and dysphoric mood. The patient is nervous/anxious.    Past Medical History  Diagnosis Date  . Anxiety   . Arthritis   . Diabetes mellitus     borderline  . GERD (gastroesophageal reflux disease)   . Hyperlipidemia   . Insomnia, unspecified   . Leukocytopenia, unspecified   . Barrett's esophagus   . Breast fibrocystic disorder   . Genital warts   . Osteoarthrosis, unspecified whether generalized or localized, ankle and foot     ankle/foot  . Exercise induced bronchospasm   . Personal history of colonic polyps   . Migraine, unspecified, without mention of intractable migraine without mention of status migrainosus   . Symptomatic menopausal or female climacteric states   . Obesity, unspecified   . Essential hypertension, benign   . Depressive disorder, not elsewhere classified   . Anemia, unspecified   . Chicken pox   . Measles   . Mumps   . Breast cancer     right breast invasive ductal carcinoma    Current Outpatient Prescriptions on File Prior to Visit  Medication Sig Dispense Refill  . albuterol (PROVENTIL HFA;VENTOLIN HFA) 108 (90 BASE) MCG/ACT inhaler Inhale 2 puffs into the lungs every 6 (six) hours as needed. Asthmatic attack  18 g  3  .  ALPRAZolam (XANAX) 0.5 MG tablet Take 1 tablet (0.5 mg total) by mouth at bedtime as needed.  30 tablet  5  . atorvastatin (LIPITOR) 20 MG tablet Take 1 tablet (20 mg total) by mouth daily.  90 tablet  3  . clindamycin (CLEOCIN) 150 MG capsule Take 150 mg by mouth as needed. Takes 4 as needed prior to dental appt      . diclofenac sodium (VOLTAREN) 1 % GEL Apply 2 g  topically 4 (four) times daily. Joint pain ankle  100 g  11  . Diclofenac-Misoprostol (ARTHROTEC PO) Take 1 tablet by mouth 3 (three) times daily.      . diphenhydrAMINE (BENADRYL) 25 MG tablet Take 25 mg by mouth at bedtime as needed. When not taking xanax.      . esomeprazole (NEXIUM) 40 MG capsule Take 1 capsule (40 mg total) by mouth 2 (two) times daily.  180 capsule  3  . frovatriptan (FROVA) 2.5 MG tablet Take 1 tablet (2.5 mg total) by mouth as needed. If recurs, may repeat after 2 hours. Max of 3 tabs in 24 hours.  10 tablet  2  . metFORMIN (GLUMETZA) 500 MG (MOD) 24 hr tablet Take 1 tablet (500 mg total) by mouth daily with breakfast.  90 tablet  3  . PARoxetine (PAXIL) 40 MG tablet Take 1 tablet (40 mg total) by mouth daily.  90 tablet  3  . cetirizine (ZYRTEC) 10 MG tablet Take 1 tablet (10 mg total) by mouth daily.  30 tablet  11  . estradiol (ESTRACE) 0.1 MG/GM vaginal cream Place 2 g vaginally once a week.      . fluconazole (DIFLUCAN) 100 MG tablet Take 1 tablet (100 mg total) by mouth daily.  10 tablet  0  . hyaluronate sodium (RADIAPLEXRX) GEL Apply topically 2 (two) times daily.      . hydrocortisone (ANUSOL-HC) 25 MG suppository Place 1 suppository (25 mg total) rectally 2 (two) times daily.  24 suppository  0  . hydrOXYzine (ATARAX/VISTARIL) 25 MG tablet Take 0.5-1 tablets (12.5-25 mg total) by mouth every 8 (eight) hours as needed for itching.  30 tablet  0  . ketoconazole (NIZORAL) 2 % shampoo Apply topically 2 (two) times a week.  120 mL  1  . miconazole (MICATIN) 2 % cream Apply topically 2 (two) times daily. Apply to affected area twice a day until clear  28.35 g  0  . non-metallic deodorant (ALRA) MISC Apply 1 application topically daily as needed.      . tamoxifen (NOLVADEX) 20 MG tablet Take 1 tablet (20 mg total) by mouth daily.  90 tablet  12  . terbinafine (LAMISIL) 250 MG tablet Take 1 tablet (250 mg total) by mouth daily.  30 tablet  0   No current  facility-administered medications on file prior to visit.   History   Social History  . Marital Status: Married    Spouse Name: N/A    Number of Children: 0  . Years of Education: college   Occupational History  . medical assistant [employer]   Social History Main Topics  . Smoking status: Never Smoker   . Smokeless tobacco: Never Used  . Alcohol Use: 0.5 oz/week    1 drink(s) per week     Comment: 1-2 glasses of wine a year  . Drug Use: No  . Sexually Active: Yes -- Female partner(s)     Comment: post menopausal   Other Topics Concern  . Not  on file   Social History Narrative   Marital status:  Married x 17 years happily, second marriage. No domestic abuse; 5 cats; no children.      Lives: with husband, 5 cats.      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      Tobacco: none       Alcohol: none      Drugs: none      Exercise:  Stair stepper x 15 minutes five days per week.  Spin gym with weights.     Moderate caffeine use.        Objective:   Physical Exam  Nursing note and vitals reviewed. Constitutional: She is oriented to person, place, and time. She appears well-developed and well-nourished. No distress.  HENT:  Head: Normocephalic and atraumatic.  Mouth/Throat: Oropharynx is clear and moist.  Eyes: Conjunctivae are normal. Pupils are equal, round, and reactive to light.  Neck: Normal range of motion. Neck supple. No thyromegaly present.  Cardiovascular: Normal rate, regular rhythm, normal heart sounds and intact distal pulses.  Exam reveals no gallop and no friction rub.   No murmur heard. Pulmonary/Chest: Effort normal and breath sounds normal. She has no wheezes. She has no rales.  Abdominal: Soft. Bowel sounds are normal. She exhibits no distension and no mass. There is no tenderness. There is no rebound and no guarding.  Lymphadenopathy:    She has no cervical adenopathy.  Neurological: She is alert and oriented to person, place, and  time. No cranial nerve deficit. She exhibits normal muscle tone. Coordination normal.  Skin: Skin is warm and dry. She is not diaphoretic.  Psychiatric: She has a normal mood and affect. Her behavior is normal.       Assessment & Plan:  Type II or unspecified type diabetes mellitus without mention of complication, not stated as uncontrolled - Plan: Comprehensive metabolic panel, Hemoglobin A1c, lisinopril (PRINIVIL,ZESTRIL) 5 MG tablet  Pure hypercholesterolemia - Plan: CK, Lipid panel  Anemia, unspecified - Plan: POCT CBC  Tinea capitis  Leukocytopenia, unspecified   1.  DMII: controlled; obtain labs; continue current medications. 2.  Hypercholesterolemia: stable; obtain labs; continue current medications. 3.  Anemia: persistent/unchanged; repeat at next visit; asymptomatic; colonoscopy UTD.  Obtain repeat hemosure next visit. 4.  Leukocytopenia: persistent; recommend discussing with oncology at appointment in three weeks. Chronic mild leukocytopenia; no previous hematological evaluation. 5.  Tinea capitis: resolved. 6. Breast cancer: stable; intolerant to Tamoxifen; to discuss with oncology in upcoming month. 7. HTN: controlled; refill of Lisinopril provided; to continue monitoring BP closely at home.  Meds ordered this encounter  Medications  . lisinopril (PRINIVIL,ZESTRIL) 5 MG tablet    Sig: Take 1 tablet (5 mg total) by mouth daily.    Dispense:  90 tablet    Refill:  3  . Diclofenac-Misoprostol 50-0.2 MG TBEC    Sig: Take 1 tablet by mouth 3 (three) times daily.    Dispense:  180 tablet    Refill:  3

## 2013-02-05 ENCOUNTER — Encounter: Payer: Self-pay | Admitting: Family Medicine

## 2013-02-15 ENCOUNTER — Telehealth: Payer: Self-pay | Admitting: Radiology

## 2013-02-15 NOTE — Telephone Encounter (Signed)
Here are my comments on your recent lab results:   1. CK normal. 2. Sugar 90 and HgbA1c excellent at 5.6. 3. Liver and kidney functions normal. 4. Cholesterol under excellent control.   Left message to advise.

## 2013-02-21 ENCOUNTER — Ambulatory Visit (HOSPITAL_BASED_OUTPATIENT_CLINIC_OR_DEPARTMENT_OTHER): Payer: 59 | Admitting: Oncology

## 2013-02-21 VITALS — BP 131/81 | HR 69 | Temp 97.7°F | Resp 18 | Ht 62.4 in | Wt 154.7 lb

## 2013-02-21 DIAGNOSIS — Z170 Estrogen receptor positive status [ER+]: Secondary | ICD-10-CM

## 2013-02-21 DIAGNOSIS — C50211 Malignant neoplasm of upper-inner quadrant of right female breast: Secondary | ICD-10-CM | POA: Insufficient documentation

## 2013-02-21 DIAGNOSIS — C50219 Malignant neoplasm of upper-inner quadrant of unspecified female breast: Secondary | ICD-10-CM

## 2013-02-21 NOTE — Progress Notes (Signed)
Patient Alisha Matthews: Alisha Matthews, female   DOB: Nov 03, 1950, 62 y.o.   MRN: [id number] Alisha Matthews: Alisha Matthews   DOB: 07/18/1950  MR#: [id number]  OZH#:[id number]  PCP: Alisha Simmer, MD GYN: Alisha Matthews SU: Alisha Matthews OTHER MD: Alisha Matthews, Alisha Matthews   HISTORY OF PRESENT ILLNESS: The patient had screening mammography at [facility] showing a worrisome area of calcifications.. I do not have a copy of that study or the following diagnostic mammography, but on 05/12/2012 the patient underwent right breast biopsy showing ([id number]) and invasive ductal carcinoma, E-cadherin positive, estrogen receptor 100% and progesterone receptor 22% positive, with an MIB-1 of 6%, and no amplification of HER-2.  On 05/20/2012 the patient underwent bilateral breast MRI, and this showed, on the right side, and 25 mm area of moderate enhancement, and on the left a 10 mm area of clumped linear enhancement. There was no abnormal lymphadenopathy noted. In the right lobe of the liver a 13 mm hyperintense lesion was incidentally noted.  On 05/24/2012 the patient underwent abdominal ultrasonography showing a complex cyst in the right liver lobe measuring 1.7 cm. There was no solid component. The liver was otherwise minimally inhomogeneous. On 05/27/2012 the patient underwent biopsy of this suspicious area on the left breast, and this turned out to be a fibroadenoma ([id number]).  Accordingly on 06/08/2012 the patient underwent left lumpectomy and sentinel lymph node sampling under [facility]. 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 Both sentinel lymph nodes were clear. Further surgery for margin clearance is planned prior to definitive radiation treatments. The patient's subsequent history is as detailed below.  INTERVAL HISTORY: Alisha Matthews returns today for followup of her breast cancer accompanied by her husband Alisha Hua. Since her last visit  here she started tamoxifen, with poor tolerance.  REVIEW OF SYSTEMS: She had a "terrible case of skin fungus", treated initially with Diflucan, later with Lamisil. The rash, which was incredibly itchy, eventually resolved, but it has w some residual lab changes including granulocytopenia and minimal thrombocytopenia. Likely that will resolve with time. The problem she had with tamoxifen is a feeling in her calves that her veins were "about the clot". The veins were tender and slightly more prominent. She did not have significant problems with hot flashes or vaginal wetness. Once she stopped the tamoxifen (after approximately one month) those sensations resolved. She has a little bit of tenderness in her surgical breast, which of course is common in this setting. A detailed review of systems today was otherwise noncontributory  PAST MEDICAL HISTORY: Past Medical History  Diagnosis Date  . Anxiety   . Arthritis   . Diabetes mellitus     borderline  . GERD (gastroesophageal reflux disease)   . Hyperlipidemia   . Insomnia, unspecified   . Leukocytopenia, unspecified   . Barrett's esophagus   . Breast fibrocystic disorder   . Genital warts   . Osteoarthrosis, unspecified whether generalized or localized, ankle and foot     ankle/foot  . Exercise induced bronchospasm   . Personal history of colonic polyps   . Migraine, unspecified, without mention of intractable migraine without mention of status migrainosus   . Symptomatic menopausal or female climacteric states   . Obesity, unspecified   . Essential hypertension, benign   . Depressive disorder, not elsewhere classified   . Anemia, unspecified   . Chicken pox   . Measles   . Mumps   . Breast  cancer     right breast invasive ductal carcinoma     PAST SURGICAL HISTORY: Past Surgical History  Procedure Laterality Date  . Cholecystectomy    . Bunionectomy      3 surgeries on both feet  . Nasal septum surgery    . Total hip  arthroplasty      left      Perthes disease  . Foot surgery      Left-revision  . Dilation and curettage of uterus  1978  . Breast lumpectomy with needle localization and axillary sentinel lymph node bx  06/08/2012    Procedure: BREAST LUMPECTOMY WITH NEEDLE LOCALIZATION AND AXILLARY SENTINEL LYMPH NODE BX;  Surgeon: Alisha Loron, MD;  Location: [facility];  Service: General;  Laterality: Right;  . Re-excision of breast cancer,superior margins  06/27/2012    Procedure: RE-EXCISION OF BREAST CANCER,SUPERIOR MARGINS;  Surgeon: Alisha Loron, MD;  Location: [facility];  Service: General;  Laterality: Right;    FAMILY HISTORY Family History  Problem Relation Age of Onset  . Adopted: Yes  . Hypertension Mother   . Diabetes Mother   . Heart failure Mother   . Cancer Maternal Grandmother 34    non hodgkins lymphoma   the patient knows little about her father, other than the fact that he died at the age of 48, causes unknown to her. The patient's mother died from complications of diabetes at the age of 22. The patient has 2 full and one half brother. She has no sisters. There is no history of breast or ovarian cancer in the family.  GYNECOLOGIC HISTORY: Menarche age 43. She is GX P0. Menopause approximately 2001. She never took hormone replacement.  SOCIAL HISTORY: She has worked as a Production assistant, radio and was involved with the [facility] transplant program while I was there. Her husband Alisha Matthews is a Surveyor, mining in a pain management clinic in [place]. Storm has told me the story of his ascending aortic dissecting aneurysm and his miraculous recovery. At home with them are numerous cats. Also 1 dog.   ADVANCED DIRECTIVES: Alisha Matthews has a living will and her husband is her healthcare power of attorney.  HEALTH MAINTENANCE: History  Substance Use Topics  . Smoking status: Never Smoker   . Smokeless tobacco: Never Used  . Alcohol Use: 0.5  oz/week    1 drink(s) per week     Comment: 1-2 glasses of wine a year     Colonoscopy: 2013, under Alisha Matthews  PAP: November 2012  Bone density: 2004, showing osteopenia  Lipid panel:  Allergies  Allergen Reactions  . Penicillins Rash    Current Outpatient Prescriptions  Medication Sig Dispense Refill  . albuterol (PROVENTIL HFA;VENTOLIN HFA) 108 (90 BASE) MCG/ACT inhaler Inhale 2 puffs into the lungs every 6 (six) hours as needed. Asthmatic attack  18 g  3  . ALPRAZolam (XANAX) 0.5 MG tablet Take 1 tablet (0.5 mg total) by mouth at bedtime as needed.  30 tablet  5  . atorvastatin (LIPITOR) 20 MG tablet Take 1 tablet (20 mg total) by mouth daily.  90 tablet  3  . cetirizine (ZYRTEC) 10 MG tablet Take 1 tablet (10 mg total) by mouth daily.  30 tablet  11  . clindamycin (CLEOCIN) 150 MG capsule Take 150 mg by mouth as needed. Takes 4 as needed prior to dental appt      . diclofenac sodium (VOLTAREN) 1 % GEL Apply 2 g  topically 4 (four) times daily. Joint pain ankle  100 g  11  . Diclofenac-Misoprostol (ARTHROTEC PO) Take 1 tablet by mouth 3 (three) times daily.      . Diclofenac-Misoprostol 50-0.2 MG TBEC Take 1 tablet by mouth 3 (three) times daily.  180 tablet  3  . diphenhydrAMINE (BENADRYL) 25 MG tablet Take 25 mg by mouth at bedtime as needed. When not taking xanax.      . esomeprazole (NEXIUM) 40 MG capsule Take 1 capsule (40 mg total) by mouth 2 (two) times daily.  180 capsule  3  . estradiol (ESTRACE) 0.1 MG/GM vaginal cream Place 2 g vaginally once a week.      . fluconazole (DIFLUCAN) 100 MG tablet Take 1 tablet (100 mg total) by mouth daily.  10 tablet  0  . frovatriptan (FROVA) 2.5 MG tablet Take 1 tablet (2.5 mg total) by mouth as needed. If recurs, may repeat after 2 hours. Max of 3 tabs in 24 hours.  10 tablet  2  . hyaluronate sodium (RADIAPLEXRX) GEL Apply topically 2 (two) times daily.      . hydrocortisone (ANUSOL-HC) 25 MG suppository Place 1 suppository (25 mg  total) rectally 2 (two) times daily.  24 suppository  0  . hydrOXYzine (ATARAX/VISTARIL) 25 MG tablet Take 0.5-1 tablets (12.5-25 mg total) by mouth every 8 (eight) hours as needed for itching.  30 tablet  0  . ketoconazole (NIZORAL) 2 % shampoo Apply topically 2 (two) times a week.  120 mL  1  . lisinopril (PRINIVIL,ZESTRIL) 5 MG tablet Take 1 tablet (5 mg total) by mouth daily.  90 tablet  3  . metFORMIN (GLUMETZA) 500 MG (MOD) 24 hr tablet Take 1 tablet (500 mg total) by mouth daily with breakfast.  90 tablet  3  . miconazole (MICATIN) 2 % cream Apply topically 2 (two) times daily. Apply to affected area twice a day until clear  28.35 g  0  . non-metallic deodorant (ALRA) MISC Apply 1 application topically daily as needed.      Marland Kitchen PARoxetine (PAXIL) 40 MG tablet Take 1 tablet (40 mg total) by mouth daily.  90 tablet  3  . tamoxifen (NOLVADEX) 20 MG tablet Take 1 tablet (20 mg total) by mouth daily.  90 tablet  12  . terbinafine (LAMISIL) 250 MG tablet Take 1 tablet (250 mg total) by mouth daily.  30 tablet  0   No current facility-administered medications for this visit.    OBJECTIVE: Middle-aged white woman in no acute distress  Filed Vitals:   02/21/13 1028  BP: 131/81  Pulse: 69  Temp: 97.7 F (36.5 C)  Resp: 18     Body mass index is 27.93 kg/(m^2).    ECOG FS: 0  Sclerae unicteric, pupils equal round and reactive to light Oropharynx clear No cervical or supraclavicular adenopathy Lungs no rales or rhonchi Heart regular rate and rhythm, no murmur appreciated Abd soft, nontender, positive bowel sounds MSK no focal spinal tenderness, no peripheral edema Neuro: nonfocal, well oriented, pleasant affect Breasts: The right breast is status post lumpectomy and radiation. There is minimal tenderness to palpation. There is the expected irregularity in the contour; there is no evidence of local recurrence. The right axilla is benign. The left breast is unremarkable.   LAB RESULTS: Lab  Results  Component Value Date   WBC 2.6* 01/30/2013   NEUTROABS 1.4* 10/31/2012   HGB 11.8* 01/30/2013   HCT 37.3* 01/30/2013   MCV 93.4  01/30/2013   PLT 197 10/31/2012      Chemistry      Component Value Date/Time   NA 137 01/30/2013 0824   NA 137 06/23/2012 1618   NA 139 11/04/2011   K 3.9 01/30/2013 0824   K 3.6 06/23/2012 1618   CL 108 01/30/2013 0824   CL 102 06/23/2012 1618   CO2 28 01/30/2013 0824   CO2 22 06/23/2012 1618   BUN 8 01/30/2013 0824   BUN 10.0 06/23/2012 1618   BUN 6 11/04/2011   CREATININE 0.67 01/30/2013 0824   CREATININE 0.9 06/23/2012 1618   CREATININE 0.7 11/04/2011   GLU 101 11/04/2011      Component Value Date/Time   CALCIUM 9.0 01/30/2013 0824   CALCIUM 9.2 06/23/2012 1618   ALKPHOS 50 01/30/2013 0824   ALKPHOS 68 06/23/2012 1618   AST 24 01/30/2013 0824   AST 26 06/23/2012 1618   ALT 19 01/30/2013 0824   ALT 24 06/23/2012 1618   BILITOT 0.4 01/30/2013 0824   BILITOT 0.44 06/23/2012 1618       Lab Results  Component Value Date   LABCA2 23 06/03/2012    No components found with this basename: LABCA125    No results found for this basename: INR,  in the last 168 hours  Urinalysis No results found for this basename: colorurine,  appearanceur,  labspec,  phurine,  glucoseu,  hgbur,  bilirubinur,  ketonesur,  proteinur,  urobilinogen,  nitrite,  leukocytesur    STUDIES: No results found.   ASSESSMENT: 62 y.o. [place] woman status post right breast biopsy December 2013 for a pT1a pN0, stage IA invasive ductal carcinoma, grade 2, estrogen receptor 100% positive, progesterone receptor 22% positive, with an MIB-1 of 6% and no HER-2 amplification  (1) completed adjuvant radiation 09/19/2012  (2) started tamoxifen in June 2014 but discontinued it after a month because of poor tolerance (varicosities)  PLAN:  She "gave tamoxifen a good try", but she is very uncomfortable with that because of concerns regarding varicosities, so she is off it and there is no plan to go back  on it. We reviewed the fact that for tumors as small as hers the risk of recurrence with local treatment only is so small that national guidelines suggest "consideration" of anti-estrogens, as opposed to recommending them.  She saw Dr. Dwain Sarna in October and will see him again October of next year. Accordingly she will see [place] in February of, which will be 2 or 3 months after her November/December mammography. That way she will see one of [place] every 6 months until she completes her 5 year followup.  She knows to call for any problems that may develop before next visit here.  MAGRINAT,GUSTAV C    02/21/2013

## 2013-02-23 ENCOUNTER — Telehealth: Payer: Self-pay | Admitting: *Deleted

## 2013-02-23 NOTE — Telephone Encounter (Signed)
sw pt gv appt for 07/24/13 w/ labs@ 10:15am and ov @ 10:45am. Pt is aware that i will mail a letter/cal...td

## 2013-03-16 ENCOUNTER — Ambulatory Visit: Payer: 59 | Admitting: Radiation Oncology

## 2013-04-19 NOTE — Addendum Note (Signed)
Encounter addended by: Billie Lade, MD on: 04/19/2013  9:19 AM<BR>     Documentation filed: Notes Section

## 2013-04-27 ENCOUNTER — Other Ambulatory Visit: Payer: Self-pay

## 2013-05-01 ENCOUNTER — Ambulatory Visit: Payer: No Typology Code available for payment source

## 2013-05-01 ENCOUNTER — Ambulatory Visit (INDEPENDENT_AMBULATORY_CARE_PROVIDER_SITE_OTHER): Payer: 59 | Admitting: Family Medicine

## 2013-05-01 ENCOUNTER — Encounter: Payer: Self-pay | Admitting: Family Medicine

## 2013-05-01 VITALS — BP 117/68 | HR 73 | Temp 99.0°F | Resp 16

## 2013-05-01 DIAGNOSIS — M25572 Pain in left ankle and joints of left foot: Secondary | ICD-10-CM

## 2013-05-01 DIAGNOSIS — C50211 Malignant neoplasm of upper-inner quadrant of right female breast: Secondary | ICD-10-CM

## 2013-05-01 DIAGNOSIS — S82409A Unspecified fracture of shaft of unspecified fibula, initial encounter for closed fracture: Secondary | ICD-10-CM

## 2013-05-01 DIAGNOSIS — F329 Major depressive disorder, single episode, unspecified: Secondary | ICD-10-CM

## 2013-05-01 DIAGNOSIS — E7800 Pure hypercholesterolemia, unspecified: Secondary | ICD-10-CM

## 2013-05-01 DIAGNOSIS — C50219 Malignant neoplasm of upper-inner quadrant of unspecified female breast: Secondary | ICD-10-CM

## 2013-05-01 DIAGNOSIS — F341 Dysthymic disorder: Secondary | ICD-10-CM

## 2013-05-01 DIAGNOSIS — E119 Type 2 diabetes mellitus without complications: Secondary | ICD-10-CM

## 2013-05-01 DIAGNOSIS — D61818 Other pancytopenia: Secondary | ICD-10-CM

## 2013-05-01 DIAGNOSIS — M25579 Pain in unspecified ankle and joints of unspecified foot: Secondary | ICD-10-CM

## 2013-05-01 DIAGNOSIS — Z23 Encounter for immunization: Secondary | ICD-10-CM

## 2013-05-01 LAB — LIPID PANEL
Cholesterol: 170 mg/dL (ref 0–200)
HDL: 62 mg/dL (ref >39)
Total CHOL/HDL Ratio: 2.7 Ratio

## 2013-05-01 LAB — POCT CBC
Hemoglobin: 11.3 g/dL — AB (ref 12.2–16.2)
Lymph, poc: 1.1 (ref 0.6–3.4)
MCHC: 30.8 g/dL — AB (ref 31.8–35.4)
MID (cbc): 0.2 (ref 0–0.9)
MPV: 7.3 fL (ref 0–99.8)
POC Granulocyte: 1.3 — AB (ref 2–6.9)
POC MID %: 6.4 %M (ref 0–12)
Platelet Count, POC: 174 10*3/uL (ref 142–424)

## 2013-05-01 LAB — COMPREHENSIVE METABOLIC PANEL
ALT: 26 U/L (ref 0–35)
BUN: 6 mg/dL (ref 6–23)
CO2: 27 mEq/L (ref 19–32)
Calcium: 8.6 mg/dL (ref 8.4–10.5)
Chloride: 103 mEq/L (ref 96–112)
Creat: 0.66 mg/dL (ref 0.50–1.10)

## 2013-05-01 LAB — POCT GLYCOSYLATED HEMOGLOBIN (HGB A1C): Hemoglobin A1C: 5.6

## 2013-05-01 MED ORDER — ZOSTER VACCINE LIVE 19400 UNT/0.65ML ~~LOC~~ SOLR: 0.6500 mL | Freq: Once | SUBCUTANEOUS | Status: DC

## 2013-05-01 NOTE — Progress Notes (Signed)
[address]   [place], [place]  [address]   [phone]  Subjective:    Patient ID: Alisha Matthews, female    DOB: September 05, 1950, 62 y.o.   MRN: [id number]  HPI This 62 y.o. female presents for three month follow-up:  1.  L ankle:  Three weeks ago, started doing ankle exercises on L; pulled something in ankle; continued to perform stair stepping with persistent pain.  Tender and swollen lateral edge; wearing ankle sleeve.  No major injury.  Every morning, pain and swelling is down.  No mis-step; sitting down with exercises.  Now pain anterior and lateral positions. Taking [employer] bid.  Iced initially.    2.  L OA knee: cannot get surgery for another year.  Using cane 50% of the time.  Wants to wait for for husband to retire.  3.  Leukocytopenia: discussed with [place].  To repeat in 07/2013.  Dr. Arlice Colt felt that leukocytopenia secondary to Lamisil and Diflucan.  Feels well.    4. Breast cancer: s/p follow-up with oncology in 01/2013.  Not taking Tamoxifen.  Scheduled repeat mammogram this month; a bit nervous.    5.  DMII:  Patient reports good compliance with medication, good tolerance to medication, and good symptom control.  Not checking sugars.  6.  Hyperlipidemia:  Patient reports good compliance with medication, good tolerance to medication, and good symptom control.     Review of Systems  Constitutional: Negative for fever, chills, diaphoresis and fatigue.  Respiratory: Negative for cough, shortness of breath, wheezing and stridor.   Cardiovascular: Positive for leg swelling. Negative for chest pain and palpitations.  Gastrointestinal: Negative for nausea, vomiting, abdominal pain and diarrhea.  Endocrine: Negative for cold intolerance, heat intolerance, polydipsia, polyphagia and polyuria.  Musculoskeletal: Positive for arthralgias, gait problem and joint swelling.  Neurological: Negative for dizziness, tremors, syncope, speech difficulty, weakness, light-headedness, numbness  and headaches.  Psychiatric/Behavioral: Positive for sleep disturbance. The patient is nervous/anxious.    Past Medical History  Diagnosis Date  . Anxiety   . Arthritis   . Diabetes mellitus     borderline  . GERD (gastroesophageal reflux disease)   . Hyperlipidemia   . Insomnia, unspecified   . Leukocytopenia, unspecified   . Barrett's esophagus   . Breast fibrocystic disorder   . Genital warts   . Osteoarthrosis, unspecified whether generalized or localized, ankle and foot     ankle/foot  . Exercise induced bronchospasm   . Personal history of colonic polyps   . Migraine, unspecified, without mention of intractable migraine without mention of status migrainosus   . Symptomatic menopausal or female climacteric states   . Obesity, unspecified   . Essential hypertension, benign   . Depressive disorder, not elsewhere classified   . Anemia, unspecified   . Chicken pox   . Measles   . Mumps   . Breast cancer     right breast invasive ductal carcinoma    Past Surgical History  Procedure Laterality Date  . Cholecystectomy    . Bunionectomy      3 surgeries on both feet  . Nasal septum surgery    . Total hip arthroplasty      left      Perthes disease  . Foot surgery      Left-revision  . Dilation and curettage of uterus  1978  . Breast lumpectomy with needle localization and axillary sentinel lymph node bx  06/08/2012    Procedure: BREAST LUMPECTOMY WITH NEEDLE LOCALIZATION  AND AXILLARY SENTINEL LYMPH NODE BX;  Surgeon: Emelia Loron, MD;  Location: [facility];  Service: General;  Laterality: Right;  . Re-excision of breast cancer,superior margins  06/27/2012    Procedure: RE-EXCISION OF BREAST CANCER,SUPERIOR MARGINS;  Surgeon: Emelia Loron, MD;  Location: [facility];  Service: General;  Laterality: Right;   Allergies  Allergen Reactions  . Penicillins Rash   History   Social History  . Marital Status: Married    Spouse Name: N/A     Number of Children: 0  . Years of Education: college   Occupational History  . unemployed [employer]    previous CMA at [employer]   Social History Main Topics  . Smoking status: Never Smoker   . Smokeless tobacco: Never Used  . Alcohol Use: 0.5 oz/week    1 drink(s) per week     Comment: 1-2 glasses of wine a year  . Drug Use: No  . Sexual Activity: Yes    Partners: Male     Comment: post menopausal   Other Topics Concern  . Not on file   Social History Narrative   Marital status:  Married x 17 years happily, second marriage. No domestic abuse; 5 cats; no children.      Lives: with husband, 5 cats.      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      Tobacco: none       Alcohol: none      Drugs: none      Exercise:  Stair stepper x 15 minutes five days per week.  Spin gym with weights.     Moderate caffeine use.    Current Outpatient Prescriptions on File Prior to Visit  Medication Sig Dispense Refill  . atorvastatin (LIPITOR) 20 MG tablet Take 1 tablet (20 mg total) by mouth daily.  90 tablet  3  . clindamycin (CLEOCIN) 150 MG capsule Take 150 mg by mouth as needed. Takes 4 as needed prior to dental appt      . diphenhydrAMINE (BENADRYL) 25 MG tablet Take 25 mg by mouth at bedtime as needed. When not taking xanax.      . esomeprazole (NEXIUM) 40 MG capsule Take 1 capsule (40 mg total) by mouth 2 (two) times daily.  180 capsule  3  . lisinopril (PRINIVIL,ZESTRIL) 5 MG tablet Take 1 tablet (5 mg total) by mouth daily.  90 tablet  3  . PARoxetine (PAXIL) 40 MG tablet Take 1 tablet (40 mg total) by mouth daily.  90 tablet  3   No current facility-administered medications on file prior to visit.       Objective:   Physical Exam  Constitutional: She is oriented to person, place, and time. She appears well-developed and well-nourished. No distress.  HENT:  Head: Normocephalic and atraumatic.  Right Ear: External ear normal.  Left Ear:  External ear normal.  Nose: Nose normal.  Mouth/Throat: Oropharynx is clear and moist.  Eyes: Conjunctivae and EOM are normal. Pupils are equal, round, and reactive to light.  Neck: Normal range of motion. Neck supple. Carotid bruit is not present. No thyromegaly present.  Cardiovascular: Normal rate, regular rhythm, normal heart sounds and intact distal pulses.  Exam reveals no gallop and no friction rub.   No murmur heard. Pulmonary/Chest: Effort normal and breath sounds normal. She has no wheezes. She has no rales.  Abdominal: Soft. Bowel sounds are  normal. She exhibits no distension and no mass. There is no tenderness. There is no rebound and no guarding.  Musculoskeletal:       Left ankle: She exhibits decreased range of motion and swelling. She exhibits no ecchymosis, no deformity and no laceration. Tenderness. Lateral malleolus tenderness found. No medial malleolus, no posterior TFL, no head of 5th metatarsal and no proximal fibula tenderness found. Achilles tendon normal.  L ankle:  +swelling lateral malleolus; +TTP lateral malleollus; pain with dorsiflexion and plantarflexion. L foot: no swelling; non-tender.  Lymphadenopathy:    She has no cervical adenopathy.  Neurological: She is alert and oriented to person, place, and time. No cranial nerve deficit.  Skin: Skin is warm and dry. No rash noted. She is not diaphoretic. No erythema. No pallor.  Psychiatric: She has a normal mood and affect. Her behavior is normal.   Results for orders placed in visit on 05/01/13  POCT CBC      Result Value Range   WBC 2.6 (*) 4.6 - 10.2 K/uL   Lymph, poc 1.1  0.6 - 3.4   POC LYMPH PERCENT 42.6  10 - 50 %L   MID (cbc) 0.2  0 - 0.9   POC MID % 6.4  0 - 12 %M   POC Granulocyte 1.3 (*) 2 - 6.9   Granulocyte percent 51.0  37 - 80 %G   RBC 3.85 (*) 4.04 - 5.48 M/uL   Hemoglobin 11.3 (*) 12.2 - 16.2 g/dL   HCT, POC 66.4 (*) 40.3 - 47.9 %   MCV 95.4  80 - 97 fL   MCH, POC 29.4  27 - 31.2 pg   MCHC  30.8 (*) 31.8 - 35.4 g/dL   RDW, POC 47.4     Platelet Count, POC 174  142 - 424 K/uL   MPV 7.3  0 - 99.8 fL  POCT GLYCOSYLATED HEMOGLOBIN (HGB A1C)      Result Value Range   Hemoglobin A1C 5.6     [facility] reading (PRIMARY) by  Dr. Katrinka Blazing.  L ANKLE:  +DISTAL FIBULA FRACTURE NON-DISPLACED.  INFLUENZA VACCINE ADMINISTERED.    Assessment & Plan:  Type II or unspecified type diabetes mellitus without mention of complication, not stated as uncontrolled - Plan: Comprehensive metabolic panel, POCT glycosylated hemoglobin (Hb A1C)  Pure hypercholesterolemia - Plan: Comprehensive metabolic panel, Lipid panel, POCT CBC  Need for influenza vaccination - Plan: Flu Vaccine QUAD 36+ mos IM  Pain, ankle, left - Plan: DG Ankle Complete Left, CANCELED: DG Ankle Complete Left  Need for Zostavax administration - Plan: zoster vaccine live, PF, (ZOSTAVAX) 25956 UNT/0.65ML injection  Closed fibular fracture, unspecified laterality, initial encounter - Plan: Ambulatory referral to Orthopedic Surgery  Anxiety and depression  Breast cancer of upper-inner quadrant of right female breast  Other pancytopenia  1. DMII: controlled; obtain labs; continue current medications. 2.  Hyperlipidemia:  Controlled; obtain labs; continue current medications. 3.  L distal fibular fracture:  New. Refer to ortho.   4.  Anxiety and depression: stable overall; some persistent anxiety due to recent breast cancer. 5.  Pancytopenia: persistent; to discuss and follow-up with oncology/hematology. 6.  Rx for Zostavax provided. 7. S/p flu vaccine.  Meds ordered this encounter  Medications  . zoster vaccine live, PF, (ZOSTAVAX) 38756 UNT/0.65ML injection    Sig: Inject 19,400 Units into the skin once.    Dispense:  1 each    Refill:  0   Nilda Simmer, M.D.  [facility]  [place] [address] [place], [place]  [address] [phone] phone [phone] fax

## 2013-05-03 ENCOUNTER — Encounter: Payer: Self-pay | Admitting: Family Medicine

## 2013-05-03 ENCOUNTER — Encounter: Payer: Self-pay | Admitting: *Deleted

## 2013-05-26 ENCOUNTER — Telehealth: Payer: Self-pay

## 2013-05-26 NOTE — Telephone Encounter (Signed)
Dr Katrinka Blazing,  Patient Saw Dr. Yisroel Ramming Fracture is 75% healed.  Has a f/u ov with him in 3 weeks.   Mammogram is normal - no cancer   She is scheduling an o/v with you.

## 2013-05-28 NOTE — Telephone Encounter (Signed)
Noted  

## 2013-06-13 ENCOUNTER — Telehealth: Payer: Self-pay | Admitting: Oncology

## 2013-06-13 ENCOUNTER — Other Ambulatory Visit: Payer: Self-pay | Admitting: Physician Assistant

## 2013-06-13 NOTE — Telephone Encounter (Signed)
lmonvm advising the pt of her r/s appts from 07/24/2013 to 07/31/2013 due to a change in the md's schedule.

## 2013-07-24 ENCOUNTER — Other Ambulatory Visit: Payer: 59

## 2013-07-24 ENCOUNTER — Ambulatory Visit: Payer: 59 | Admitting: Physician Assistant

## 2013-07-31 ENCOUNTER — Ambulatory Visit (HOSPITAL_BASED_OUTPATIENT_CLINIC_OR_DEPARTMENT_OTHER): Payer: 59 | Admitting: Hematology and Oncology

## 2013-07-31 ENCOUNTER — Other Ambulatory Visit (HOSPITAL_BASED_OUTPATIENT_CLINIC_OR_DEPARTMENT_OTHER): Payer: 59

## 2013-07-31 ENCOUNTER — Telehealth: Payer: Self-pay | Admitting: Oncology

## 2013-07-31 VITALS — BP 150/82 | HR 69 | Temp 99.7°F | Resp 18 | Ht 62.5 in | Wt 156.5 lb

## 2013-07-31 DIAGNOSIS — C50211 Malignant neoplasm of upper-inner quadrant of right female breast: Secondary | ICD-10-CM

## 2013-07-31 DIAGNOSIS — C50219 Malignant neoplasm of upper-inner quadrant of unspecified female breast: Secondary | ICD-10-CM

## 2013-07-31 DIAGNOSIS — D649 Anemia, unspecified: Secondary | ICD-10-CM

## 2013-07-31 DIAGNOSIS — C50919 Malignant neoplasm of unspecified site of unspecified female breast: Secondary | ICD-10-CM

## 2013-07-31 DIAGNOSIS — Z170 Estrogen receptor positive status [ER+]: Secondary | ICD-10-CM

## 2013-07-31 DIAGNOSIS — N644 Mastodynia: Secondary | ICD-10-CM

## 2013-07-31 LAB — COMPREHENSIVE METABOLIC PANEL (CC13)
ALT: 35 U/L (ref 0–55)
ANION GAP: 9 meq/L (ref 3–11)
AST: 33 U/L (ref 5–34)
Albumin: 4 g/dL (ref 3.5–5.0)
Alkaline Phosphatase: 75 U/L (ref 40–150)
BILIRUBIN TOTAL: 0.41 mg/dL (ref 0.20–1.20)
BUN: 9.5 mg/dL (ref 7.0–26.0)
CO2: 27 mEq/L (ref 22–29)
CREATININE: 0.8 mg/dL (ref 0.6–1.1)
Calcium: 9.6 mg/dL (ref 8.4–10.4)
Chloride: 103 mEq/L (ref 98–109)
GLUCOSE: 91 mg/dL (ref 70–140)
Potassium: 4.1 mEq/L (ref 3.5–5.1)
Sodium: 139 mEq/L (ref 136–145)
Total Protein: 6.9 g/dL (ref 6.4–8.3)

## 2013-07-31 LAB — CBC WITH DIFFERENTIAL/PLATELET
BASO%: 0.7 % (ref 0.0–2.0)
BASOS ABS: 0 10*3/uL (ref 0.0–0.1)
EOS ABS: 0.1 10*3/uL (ref 0.0–0.5)
EOS%: 2.3 % (ref 0.0–7.0)
HCT: 35 % (ref 34.8–46.6)
HGB: 11.7 g/dL (ref 11.6–15.9)
LYMPH%: 31.8 % (ref 14.0–49.7)
MCH: 29.5 pg (ref 25.1–34.0)
MCHC: 33.4 g/dL (ref 31.5–36.0)
MCV: 88.2 fL (ref 79.5–101.0)
MONO#: 0.3 10*3/uL (ref 0.1–0.9)
MONO%: 7.9 % (ref 0.0–14.0)
NEUT#: 2.5 10*3/uL (ref 1.5–6.5)
NEUT%: 57.3 % (ref 38.4–76.8)
Platelets: 182 10*3/uL (ref 145–400)
RBC: 3.97 10*6/uL (ref 3.70–5.45)
RDW: 12.8 % (ref 11.2–14.5)
WBC: 4.3 10*3/uL (ref 3.9–10.3)
lymph#: 1.4 10*3/uL (ref 0.9–3.3)

## 2013-07-31 NOTE — Telephone Encounter (Signed)
, °

## 2013-07-31 NOTE — Progress Notes (Signed)
Patient ID: Alisha Matthews, female   DOB: 1950-09-13, 63 y.o.   MRN: [id number] ID: Alisha Matthews   DOB: Sep 21, 1950  MR#: [id number]  ASN#:[id number]  PCP: Reginia Forts, MD GYN: Elyse Hsu SU: Rolm Bookbinder OTHER MD: Christene Slates, Lucio Edward  DIAGNOSIS: Right breast CA  CHIEF COMPLAINT: Breast cancer surveillance  HISTORY OF PRESENT ILLNESS: As mentioned in previous note:  The patient had screening mammography at [facility] showing a worrisome area of calcifications.. I do not have a copy of that study or the following diagnostic mammography, but on 05/12/2012 the patient underwent right breast biopsy showing ([id number]) and invasive ductal carcinoma, E-cadherin positive, estrogen receptor 100% and progesterone receptor 22% positive, with an MIB-1 of 6%, and no amplification of HER-2.  On 05/20/2012 the patient underwent bilateral breast MRI, and this showed, on the right side, and 25 mm area of moderate enhancement, and on the left a 10 mm area of clumped linear enhancement. There was no abnormal lymphadenopathy noted. In the right lobe of the liver a 13 mm hyperintense lesion was incidentally noted.  On 05/24/2012 the patient underwent abdominal ultrasonography showing a complex cyst in the right liver lobe measuring 1.7 cm. There was no solid component. The liver was otherwise minimally inhomogeneous. On 05/27/2012 the patient underwent biopsy of this suspicious area on the left breast, and this turned out to be a fibroadenoma ([id number]).  Accordingly on 06/08/2012 the patient underwent left lumpectomy and sentinel lymph node sampling under [facility]. 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 Both sentinel lymph nodes were clear. Further surgery for margin clearance is planned prior to definitive radiation treatments. The patient's subsequent history is as detailed below.  INTERVAL  HISTORY: Alisha Matthews returns today for followup visit of her breast cancer. She says that she took tamoxifen for one month.In view of side effects her tamoxifen was stopped approximately in the month of June, 2014. At present she says that she's been doing very well and she does diet and exercise. She does not complain of any burning pain  in the legs. She says that she drinks more carbonate soft drinks and also coffee. She was wondering if right breast discomfort is related to her intake of caffeinated products.  She denies any shortness of the breath, constipation, blood in the stool blood in the urine. She is up-to-date with her colonoscopies, Pap smears and and flu vaccines. She says that she does not want to get the DEXA scan her last DEXA scan was  performed in 2004.    REVIEW OF SYSTEMS: A 14 point review of systems is been assessed and pertinent symptoms were mentioned in interval history  PAST MEDICAL HISTORY: Past Medical History  Diagnosis Date  . Anxiety   . Arthritis   . Diabetes mellitus     borderline  . GERD (gastroesophageal reflux disease)   . Hyperlipidemia   . Insomnia, unspecified   . Leukocytopenia, unspecified   . Barrett's esophagus   . Breast fibrocystic disorder   . Genital warts   . Osteoarthrosis, unspecified whether generalized or localized, ankle and foot     ankle/foot  . Exercise induced bronchospasm   . Personal history of colonic polyps   . Migraine, unspecified, without mention of intractable migraine without mention of status migrainosus   . Symptomatic menopausal or female climacteric states   . Obesity, unspecified   . Essential hypertension, benign   .  Depressive disorder, not elsewhere classified   . Anemia, unspecified   . Chicken pox   . Measles   . Mumps   . Breast cancer     right breast invasive ductal carcinoma     PAST SURGICAL HISTORY: Past Surgical History  Procedure Laterality Date  . Cholecystectomy    . Bunionectomy      3  surgeries on both feet  . Nasal septum surgery    . Total hip arthroplasty      left      Perthes disease  . Foot surgery      Left-revision  . Dilation and curettage of uterus  1978  . Breast lumpectomy with needle localization and axillary sentinel lymph node bx  06/08/2012    Procedure: BREAST LUMPECTOMY WITH NEEDLE LOCALIZATION AND AXILLARY SENTINEL LYMPH NODE BX;  Surgeon: Rolm Bookbinder, MD;  Location: [place];  Service: General;  Laterality: Right;  . Re-excision of breast cancer,superior margins  06/27/2012    Procedure: RE-EXCISION OF BREAST CANCER,SUPERIOR MARGINS;  Surgeon: Rolm Bookbinder, MD;  Location: [place];  Service: General;  Laterality: Right;    FAMILY HISTORY Family History  Problem Relation Age of Onset  . Adopted: Yes  . Hypertension Mother   . Diabetes Mother   . Heart failure Mother   . Cancer Maternal Grandmother 76    non hodgkins lymphoma   the patient knows little about her father, other than the fact that he died at the age of 59, causes unknown to her. The patient's mother died from complications of diabetes at the age of 15. The patient has 2 full and one half brother. She has no sisters. There is no history of breast or ovarian cancer in the family.  GYNECOLOGIC HISTORY: Menarche age 17. She is GX P0. Menopause approximately 2001. She never took hormone replacement.  SOCIAL HISTORY: She has worked as a Merchandiser, retail and was involved with the [place] transplant program while I was there. Her husband Bronwen Betters Lemma is a Conservation officer, historic buildings in a pain management clinic in [place]. Toshiko has told me the story of his ascending aortic dissecting aneurysm and his miraculous recovery. At home with them are numerous cats. Also 1 dog.   ADVANCED DIRECTIVES: Fidela has a living will and her husband is her healthcare power of attorney.  HEALTH MAINTENANCE: History  Substance Use Topics  . Smoking status: Never  Smoker   . Smokeless tobacco: Never Used  . Alcohol Use: 0.5 oz/week    1 drink(s) per week     Comment: 1-2 glasses of wine a year     Colonoscopy: 2013, under Lucio Edward  PAP: November 2012  Bone density: 2004, showing osteopenia  Lipid panel:  Allergies  Allergen Reactions  . Penicillins Rash    Current Outpatient Prescriptions  Medication Sig Dispense Refill  . albuterol (PROVENTIL HFA;VENTOLIN HFA) 108 (90 BASE) MCG/ACT inhaler Inhale 2 puffs into the lungs every 6 (six) hours as needed. Asthmatic attack  18 g  3  . ALPRAZolam (XANAX) 0.5 MG tablet Take 1 tablet (0.5 mg total) by mouth at bedtime as needed.  30 tablet  5  . atorvastatin (LIPITOR) 20 MG tablet Take 1 tablet (20 mg total) by mouth daily.  90 tablet  3  . diclofenac sodium (VOLTAREN) 1 % GEL Apply 2 g topically 4 (four) times daily. Joint pain ankle  100 g  11  . Diclofenac-Misoprostol 50-0.2 MG TBEC Take  1 tablet by mouth 3 (three) times daily.  180 tablet  3  . diphenhydrAMINE (BENADRYL) 25 MG tablet Take 25 mg by mouth at bedtime as needed. When not taking xanax.      . esomeprazole (NEXIUM) 40 MG capsule Take 1 capsule (40 mg total) by mouth 2 (two) times daily.  180 capsule  3  . frovatriptan (FROVA) 2.5 MG tablet Take 1 tablet (2.5 mg total) by mouth as needed. If recurs, may repeat after 2 hours. Max of 3 tabs in 24 hours.  10 tablet  2  . lisinopril (PRINIVIL,ZESTRIL) 5 MG tablet Take 1 tablet (5 mg total) by mouth daily.  90 tablet  3  . metFORMIN (GLUMETZA) 500 MG (MOD) 24 hr tablet Take 1 tablet (500 mg total) by mouth daily with breakfast.  90 tablet  3  . PARoxetine (PAXIL) 40 MG tablet Take 1 tablet (40 mg total) by mouth daily.  90 tablet  3  . clindamycin (CLEOCIN) 150 MG capsule Take 150 mg by mouth as needed. Takes 4 as needed prior to dental appt      . zoster vaccine live, PF, (ZOSTAVAX) 23762 UNT/0.65ML injection Inject 19,400 Units into the skin once.  1 each  0   No current  facility-administered medications for this visit.    OBJECTIVE: Middle-aged white woman in no acute distress  Filed Vitals:   07/31/13 1055  BP: 150/82  Pulse: 69  Temp: 99.7 F (37.6 C)  Resp: 18     Body mass index is 28.15 kg/(m^2).    ECOG FS: 0   HEENT PERRLA, sclerae anicteric, conjunctiva no pallor , pupils equal round and reactive to light Oropharynx clear, Moist mucous membranes  No cervical or supraclavicular adenopathy Lungs no rales or rhonchi Heart regular rate and rhythm, no murmur appreciated Abd soft, nontender, positive bowel sounds MSK no focal spinal tenderness, no peripheral edema Neuro: nonfocal, well oriented, pleasant affect Breasts: The right breast is status post lumpectomy and radiation. There is minimal tenderness to palpation. There is the expected irregularity in the contour; there is no evidence of local recurrence. The right axilla is benign. The left breast is unremarkable. Extremities no edema no cyanosis no clubbing Skin intact, no skin lesions noted   LAB RESULTS: Lab Results  Component Value Date   WBC 4.3 07/31/2013   NEUTROABS 2.5 07/31/2013   HGB 11.7 07/31/2013   HCT 35.0 07/31/2013   MCV 88.2 07/31/2013   PLT 182 07/31/2013      Chemistry      Component Value Date/Time   NA 139 07/31/2013 1046   NA 138 05/01/2013 0935   NA 139 11/04/2011   K 4.1 07/31/2013 1046   K 4.3 05/01/2013 0935   CL 103 05/01/2013 0935   CL 102 06/23/2012 1618   CO2 27 07/31/2013 1046   CO2 27 05/01/2013 0935   BUN 9.5 07/31/2013 1046   BUN 6 05/01/2013 0935   BUN 6 11/04/2011   CREATININE 0.8 07/31/2013 1046   CREATININE 0.66 05/01/2013 0935   CREATININE 0.7 11/04/2011   GLU 101 11/04/2011      Component Value Date/Time   CALCIUM 9.6 07/31/2013 1046   CALCIUM 8.6 05/01/2013 0935   ALKPHOS 75 07/31/2013 1046   ALKPHOS 60 05/01/2013 0935   AST 33 07/31/2013 1046   AST 27 05/01/2013 0935   ALT 35 07/31/2013 1046   ALT 26 05/01/2013 0935   BILITOT 0.41 07/31/2013 1046    BILITOT 0.4  05/01/2013 0935       Lab Results  Component Value Date   LABCA2 23 06/03/2012    No components found with this basename: [id number]    No results found for this basename: INR,  in the last 168 hours  Urinalysis No results found for this basename: colorurine,  appearanceur,  labspec,  phurine,  glucoseu,  hgbur,  bilirubinur,  ketonesur,  proteinur,  urobilinogen,  nitrite,  leukocytesur    STUDIES: No results found.   ASSESSMENT: 63 y.o. [place] woman status post right breast biopsy December 2013 for a pT1a pN0, stage IA invasive ductal carcinoma, grade 2, estrogen receptor 100% positive, progesterone receptor 22% positive, with an MIB-1 of 6% and no HER-2 amplification  (1) completed adjuvant radiation 09/19/2012  (2) started tamoxifen in June 2014 but discontinued it after a month because of poor tolerance (varicosities)  PLAN:  I have reviewed Ms. Mckenney's mammogram which was performed in November 2014 and that revealed no  mammographic evidence of any malignancy and annual diagnostic mammogram was recommended.She is at present not on any antiestrogen therapy she understands that in view of her side effects with tamoxifen, she doesn't want to be on any new therapies at this time. She is doing very well clinically without any evidence of disease recurrence. I have also reviewed her laboratory parameters including CBC and CMP performed today and those were within normal range except the mild anemia. She says that she's been having anemia since long time and she says that that is  her best hemoglobin and hematocrit level .   she'll be seeing Dr. Donne Hazel  October of next year.  I have asked her to decrease the intake of caffeinated products and see if the breast discomfort gets better. We will see her in oncology clinic in 3 months with CBC differential and CMP. If her breast discomfort does not get better we will obtain ultrasound of the breast. I have encouraged the  Ms.Whalley to perform monthly self breast examinations Next mammogram due in November 2015 Follow up with primary care physician as scheduled  She is in agreement with the present plan of care. She knows to call for any problems that may develop before next visit here.   Wilmon Arms   Medical oncology   07/31/2013

## 2013-08-07 ENCOUNTER — Ambulatory Visit: Payer: 59 | Admitting: Family Medicine

## 2013-08-14 ENCOUNTER — Ambulatory Visit: Payer: 59 | Admitting: Family Medicine

## 2013-09-07 ENCOUNTER — Other Ambulatory Visit: Payer: Self-pay

## 2013-09-07 NOTE — Telephone Encounter (Signed)
Pended medications- Changed pharmacy.

## 2013-09-07 NOTE — Telephone Encounter (Signed)
Patient called and wants to know if we can change her pharmacy from [facility] in [place] to the [facility]. She needs refills on her xanax, metformin, and diclofenac-misopoyl to be called in. Patient also plans to make an appointment within the next month.

## 2013-09-08 ENCOUNTER — Telehealth: Payer: Self-pay

## 2013-09-08 MED ORDER — METFORMIN HCL ER (MOD) 500 MG PO TB24: 500.0000 mg | ORAL_TABLET | Freq: Every day | ORAL | Status: DC

## 2013-09-08 MED ORDER — DICLOFENAC-MISOPROSTOL 50-0.2 MG PO TBEC: 1.0000 | DELAYED_RELEASE_TABLET | Freq: Three times a day (TID) | ORAL | Status: DC

## 2013-09-08 MED ORDER — ALPRAZOLAM 0.5 MG PO TABS: 0.5000 mg | ORAL_TABLET | Freq: Every evening | ORAL | Status: DC | PRN

## 2013-09-08 NOTE — Telephone Encounter (Signed)
Refills sent this morning.  Advised pt.

## 2013-09-08 NOTE — Telephone Encounter (Signed)
Called in xanax to [facility]

## 2013-09-08 NOTE — Telephone Encounter (Signed)
Approved refills; please call in refill on Xanax.

## 2013-09-08 NOTE — Telephone Encounter (Signed)
PT WOULD LIKE A REFILL ON THE FOLLOWING MEDICATIONS: 1.) ALPRAZELAM 3 MONTHS 2.) METFORMIN  3.) Diclofenac-Misoprostol   SHE STATES THAT SHE WILL BE IN FOR AND OV SOON AND WOULD ALSO LIKE FOR [place] TO CHECK LABS FROM THE CANCER CENTER.  BEST# [phone]

## 2013-09-11 ENCOUNTER — Encounter: Payer: Self-pay | Admitting: *Deleted

## 2013-09-12 NOTE — Telephone Encounter (Signed)
Patient needs metformin and [place]   [phone]

## 2013-09-22 ENCOUNTER — Ambulatory Visit (INDEPENDENT_AMBULATORY_CARE_PROVIDER_SITE_OTHER): Payer: 59 | Admitting: Family Medicine

## 2013-09-22 ENCOUNTER — Encounter: Payer: Self-pay | Admitting: Family Medicine

## 2013-09-22 VITALS — BP 113/76 | HR 74 | Temp 99.1°F | Resp 16 | Ht 62.5 in | Wt 148.0 lb

## 2013-09-22 DIAGNOSIS — E785 Hyperlipidemia, unspecified: Secondary | ICD-10-CM

## 2013-09-22 DIAGNOSIS — C50211 Malignant neoplasm of upper-inner quadrant of right female breast: Secondary | ICD-10-CM

## 2013-09-22 DIAGNOSIS — F329 Major depressive disorder, single episode, unspecified: Secondary | ICD-10-CM

## 2013-09-22 DIAGNOSIS — I10 Essential (primary) hypertension: Secondary | ICD-10-CM

## 2013-09-22 DIAGNOSIS — F341 Dysthymic disorder: Secondary | ICD-10-CM

## 2013-09-22 DIAGNOSIS — C50219 Malignant neoplasm of upper-inner quadrant of unspecified female breast: Secondary | ICD-10-CM

## 2013-09-22 DIAGNOSIS — E7800 Pure hypercholesterolemia, unspecified: Secondary | ICD-10-CM

## 2013-09-22 DIAGNOSIS — F419 Anxiety disorder, unspecified: Secondary | ICD-10-CM

## 2013-09-22 DIAGNOSIS — E119 Type 2 diabetes mellitus without complications: Secondary | ICD-10-CM

## 2013-09-22 DIAGNOSIS — D61818 Other pancytopenia: Secondary | ICD-10-CM

## 2013-09-22 LAB — COMPLETE METABOLIC PANEL WITH GFR
ALBUMIN: 4.1 g/dL (ref 3.5–5.2)
ALK PHOS: 57 U/L (ref 39–117)
ALT: 23 U/L (ref 0–35)
AST: 25 U/L (ref 0–37)
BILIRUBIN TOTAL: 0.5 mg/dL (ref 0.2–1.2)
BUN: 8 mg/dL (ref 6–23)
CO2: 26 mEq/L (ref 19–32)
Calcium: 8.9 mg/dL (ref 8.4–10.5)
Chloride: 102 mEq/L (ref 96–112)
Creat: 0.63 mg/dL (ref 0.50–1.10)
GFR, Est African American: >89 mL/min
GLUCOSE: 106 mg/dL — AB (ref 70–99)
POTASSIUM: 3.9 meq/L (ref 3.5–5.3)
SODIUM: 135 meq/L (ref 135–145)
Total Protein: 6.4 g/dL (ref 6.0–8.3)

## 2013-09-22 LAB — CBC WITH DIFFERENTIAL/PLATELET
BASOS ABS: 0 10*3/uL (ref 0.0–0.1)
BASOS PCT: 1 % (ref 0–1)
EOS ABS: 0.2 10*3/uL (ref 0.0–0.7)
Eosinophils Relative: 5 % (ref 0–5)
HCT: 33.6 % — ABNORMAL LOW (ref 36.0–46.0)
Hemoglobin: 11.6 g/dL — ABNORMAL LOW (ref 12.0–15.0)
Lymphocytes Relative: 36 % (ref 12–46)
Lymphs Abs: 1.2 10*3/uL (ref 0.7–4.0)
MCH: 28.9 pg (ref 26.0–34.0)
MCHC: 34.5 g/dL (ref 30.0–36.0)
MCV: 83.8 fL (ref 78.0–100.0)
MONOS PCT: 9 % (ref 3–12)
Monocytes Absolute: 0.3 10*3/uL (ref 0.1–1.0)
NEUTROS PCT: 49 % (ref 43–77)
Neutro Abs: 1.6 10*3/uL — ABNORMAL LOW (ref 1.7–7.7)
Platelets: 183 10*3/uL (ref 150–400)
RBC: 4.01 MIL/uL (ref 3.87–5.11)
RDW: 13.4 % (ref 11.5–15.5)
WBC: 3.3 10*3/uL — ABNORMAL LOW (ref 4.0–10.5)

## 2013-09-22 LAB — LIPID PANEL
CHOL/HDL RATIO: 2.2 ratio
Cholesterol: 191 mg/dL (ref 0–200)
HDL: 86 mg/dL (ref >39)
LDL Cholesterol: 90 mg/dL (ref 0–99)
Triglycerides: 73 mg/dL (ref <150)
VLDL: 15 mg/dL (ref 0–40)

## 2013-09-22 LAB — POCT GLYCOSYLATED HEMOGLOBIN (HGB A1C): Hemoglobin A1C: 5.7

## 2013-09-22 MED ORDER — METFORMIN HCL ER (MOD) 500 MG PO TB24: 500.0000 mg | ORAL_TABLET | Freq: Every day | ORAL | Status: DC

## 2013-09-22 MED ORDER — ALPRAZOLAM 0.5 MG PO TABS: 0.5000 mg | ORAL_TABLET | Freq: Every evening | ORAL | Status: DC | PRN

## 2013-09-22 MED ORDER — FROVATRIPTAN SUCCINATE 2.5 MG PO TABS
2.5000 mg | ORAL_TABLET | ORAL | Status: DC | PRN
Start: 2013-09-22 — End: 2014-02-09

## 2013-09-22 MED ORDER — METFORMIN HCL ER 500 MG PO TB24: 500.0000 mg | ORAL_TABLET | Freq: Every day | ORAL | Status: DC

## 2013-09-22 MED ORDER — ALBUTEROL SULFATE HFA 108 (90 BASE) MCG/ACT IN AERS: 2.0000 | INHALATION_SPRAY | Freq: Four times a day (QID) | RESPIRATORY_TRACT | Status: DC | PRN

## 2013-09-22 MED ORDER — DICLOFENAC-MISOPROSTOL 50-0.2 MG PO TBEC: 1.0000 | DELAYED_RELEASE_TABLET | Freq: Three times a day (TID) | ORAL | Status: DC

## 2013-09-22 NOTE — Progress Notes (Signed)
Subjective:    Patient ID: Alisha Matthews, female    DOB: Jan 15, 1951, 63 y.o.   MRN: [id number]  09/22/2013  Follow-up, Medication Refill, Depression, Diabetes and Hyperlipidemia   HPI This 63 y.o. female presents for six month follow-up:  1.  DMII: taking Metformin 500mg  daily; will take a second at times.  Mostly 500mg  daily.  Not checking sugars.  Taking Lisinopril 5mg  daily; no low BPs lately.  One 90/60 since last visit; usually 115/70s. Eye exam two years ago.    2. Hyperlipidemia:  Taking Lipitor 20mg  daily; does not miss any.  Patient reports good compliance with medication, good tolerance to medication, and good symptom control.     3. Leukocytopenia: repeat labs by oncology in 07/2013; CBC normal.  Pt and oncology very pleased with results.    4.  Depression with anxiety: alternates 20mg  and 40mg  of Paxil daily; feels better than she has felt in ten years.  Takes Alprazolam qod at nighttime for insomnia; alternates with Benadryl. Some nights will take nothing.  Emotionally doing very well.   5.  L fibula fracture: doing well. [facility] out of network. Has been released.   6. Breast cancer:  S/p follow-up with [facility].   Doing well.    Review of Systems  Constitutional: Negative for fever, chills, diaphoresis and fatigue.  Respiratory: Negative for cough, shortness of breath, wheezing and stridor.   Cardiovascular: Negative for chest pain, palpitations and leg swelling.  Gastrointestinal: Negative for nausea, vomiting, abdominal pain and diarrhea.  Endocrine: Negative for cold intolerance, heat intolerance, polydipsia, polyphagia and polyuria.  Musculoskeletal: Positive for arthralgias.  Skin: Negative for color change, pallor, rash and wound.  Neurological: Negative for dizziness, tremors, seizures, syncope, facial asymmetry, speech difficulty, weakness, light-headedness, numbness and headaches.  Psychiatric/Behavioral: Positive for sleep disturbance. Negative for  suicidal ideas, self-injury and dysphoric mood. The patient is nervous/anxious.     Past Medical History  Diagnosis Date  . Anxiety   . Arthritis   . Diabetes mellitus     borderline  . GERD (gastroesophageal reflux disease)   . Hyperlipidemia   . Insomnia, unspecified   . Leukocytopenia, unspecified   . Barrett's esophagus   . Breast fibrocystic disorder   . Genital warts   . Osteoarthrosis, unspecified whether generalized or localized, ankle and foot     ankle/foot  . Exercise induced bronchospasm   . Personal history of colonic polyps   . Migraine, unspecified, without mention of intractable migraine without mention of status migrainosus   . Symptomatic menopausal or female climacteric states   . Obesity, unspecified   . Essential hypertension, benign   . Depressive disorder, not elsewhere classified   . Anemia, unspecified   . Chicken pox   . Measles   . Mumps   . Breast cancer     right breast invasive ductal carcinoma    Past Surgical History  Procedure Laterality Date  . Cholecystectomy    . Bunionectomy      3 surgeries on both feet  . Nasal septum surgery    . Total hip arthroplasty      left      Perthes disease  . Foot surgery      Left-revision  . Dilation and curettage of uterus  1978  . Breast lumpectomy with needle localization and axillary sentinel lymph node bx  06/08/2012    Procedure: BREAST LUMPECTOMY WITH NEEDLE LOCALIZATION AND AXILLARY SENTINEL LYMPH NODE BX;  Surgeon: Rolm Bookbinder, MD;  Location: [facility];  Service: General;  Laterality: Right;  . Re-excision of breast cancer,superior margins  06/27/2012    Procedure: RE-EXCISION OF BREAST CANCER,SUPERIOR MARGINS;  Surgeon: Rolm Bookbinder, MD;  Location: [facility];  Service: General;  Laterality: Right;    Allergies  Allergen Reactions  . Penicillins Rash   Current Outpatient Prescriptions  Medication Sig Dispense Refill  . albuterol (PROVENTIL  HFA;VENTOLIN HFA) 108 (90 BASE) MCG/ACT inhaler Inhale 2 puffs into the lungs every 6 (six) hours as needed. Asthmatic attack  18 g  3  . ALPRAZolam (XANAX) 0.5 MG tablet Take 1 tablet (0.5 mg total) by mouth at bedtime as needed.  30 tablet  5  . atorvastatin (LIPITOR) 20 MG tablet Take 1 tablet (20 mg total) by mouth daily.  90 tablet  3  . clindamycin (CLEOCIN) 150 MG capsule Take 150 mg by mouth as needed. Takes 4 as needed prior to dental appt      . Diclofenac-Misoprostol 50-0.2 MG TBEC Take 1 tablet by mouth 3 (three) times daily.  270 tablet  2  . diphenhydrAMINE (BENADRYL) 25 MG tablet Take 25 mg by mouth at bedtime as needed. When not taking xanax.      . esomeprazole (NEXIUM) 40 MG capsule Take 1 capsule (40 mg total) by mouth 2 (two) times daily.  180 capsule  3  . frovatriptan (FROVA) 2.5 MG tablet Take 1 tablet (2.5 mg total) by mouth as needed. If recurs, may repeat after 2 hours. Max of 3 tabs in 24 hours.  10 tablet  2  . lisinopril (PRINIVIL,ZESTRIL) 5 MG tablet Take 1 tablet (5 mg total) by mouth daily.  90 tablet  3  . metFORMIN (GLUMETZA) 500 MG (MOD) 24 hr tablet Take 1 tablet (500 mg total) by mouth daily with breakfast.  90 tablet  1  . PARoxetine (PAXIL) 40 MG tablet Take 1 tablet (40 mg total) by mouth daily.  90 tablet  3  . zoster vaccine live, PF, (ZOSTAVAX) 60454 UNT/0.65ML injection Inject 19,400 Units into the skin once.  1 each  0   No current facility-administered medications for this visit.   History   Social History  . Marital Status: Married    Spouse Name: N/A    Number of Children: 0  . Years of Education: college   Occupational History  . unemployed [employer]    previous [employer] at [employer]  . Smoking status: Never Smoker   . Smokeless tobacco: Never Used  . Alcohol Use: 0.5 oz/week    1 drink(s) per week     Comment: 1-2 glasses of wine a year  . Drug Use: No  . Sexual Activity: Yes    Partners: Male     Comment: post  menopausal   Other Topics Concern  . Not on file   Social History Narrative   Marital status:  Married x 17 years happily, second marriage. No domestic abuse; 5 cats; no children.      Lives: with husband, 5 cats.      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      Tobacco: none       Alcohol: none      Drugs: none      Exercise:  Stair stepper x 15 minutes five days per week.  Spin gym with weights.     Moderate  caffeine use.        Objective:    BP 113/76  Pulse 74  Temp(Src) 99.1 F (37.3 C)  Resp 16  Ht 5' 2.5" (1.588 m)  Wt 148 lb (67.132 kg)  BMI 26.62 kg/m2  SpO2 99%  LMP 06/22/2000 Physical Exam  Constitutional: She is oriented to person, place, and time. She appears well-developed and well-nourished. No distress.  HENT:  Head: Normocephalic and atraumatic.  Right Ear: External ear normal.  Left Ear: External ear normal.  Nose: Nose normal.  Mouth/Throat: Oropharynx is clear and moist.  Eyes: Conjunctivae and EOM are normal. Pupils are equal, round, and reactive to light.  Neck: Normal range of motion. Neck supple. Carotid bruit is not present. No thyromegaly present.  Cardiovascular: Normal rate, regular rhythm, normal heart sounds and intact distal pulses.  Exam reveals no gallop and no friction rub.   No murmur heard. Pulmonary/Chest: Effort normal and breath sounds normal. She has no wheezes. She has no rales.  Abdominal: Soft. Bowel sounds are normal. She exhibits no distension and no mass. There is no tenderness. There is no rebound and no guarding.  Lymphadenopathy:    She has no cervical adenopathy.  Neurological: She is alert and oriented to person, place, and time. No cranial nerve deficit.  Skin: Skin is warm and dry. No rash noted. She is not diaphoretic. No erythema. No pallor.  Callus B feet.  Psychiatric: She has a normal mood and affect. Her behavior is normal.   Results for orders placed in visit on  09/22/13  POCT GLYCOSYLATED HEMOGLOBIN (HGB A1C)      Result Value Ref Range   Hemoglobin A1C 5.7         Assessment & Plan:  Type II or unspecified type diabetes mellitus without mention of complication, not stated as uncontrolled - Plan: POCT glycosylated hemoglobin (Hb A1C), HM Diabetes Foot Exam  Essential hypertension, benign - Plan: CBC with Differential, COMPLETE METABOLIC PANEL WITH GFR  Hyperlipidemia - Plan: Lipid panel  Anxiety and depression  Breast cancer of upper-inner quadrant of right female breast  Other pancytopenia  1. DMII; controlled; obtain labs; refill provided.  Recommend annual eye exam. 2.  HTN: controlled; continue medication; refill provided. 3.  Hyperlipidemia:  Controlled; obtain labs; continue current medication. 4.  Anxiety and depression: controlled and improved; refill of Xanax provided. 5.  Pancytopenia:  Improved.  Repeat CBC today. 6.  Breast cancer R: stable; s/p recent follow-up with oncology.  Meds ordered this encounter  Medications  . ALPRAZolam (XANAX) 0.5 MG tablet    Sig: Take 1 tablet (0.5 mg total) by mouth at bedtime as needed.    Dispense:  30 tablet    Refill:  5  . albuterol (PROVENTIL HFA;VENTOLIN HFA) 108 (90 BASE) MCG/ACT inhaler    Sig: Inhale 2 puffs into the lungs every 6 (six) hours as needed. Asthmatic attack    Dispense:  18 g    Refill:  3  . Diclofenac-Misoprostol 50-0.2 MG TBEC    Sig: Take 1 tablet by mouth 3 (three) times daily.    Dispense:  270 tablet    Refill:  2  . frovatriptan (FROVA) 2.5 MG tablet    Sig: Take 1 tablet (2.5 mg total) by mouth as needed. If recurs, may repeat after 2 hours. Max of 3 tabs in 24 hours.    Dispense:  10 tablet    Refill:  2  . metFORMIN (GLUMETZA) 500 MG (MOD) 24 hr  tablet    Sig: Take 1 tablet (500 mg total) by mouth daily with breakfast.    Dispense:  90 tablet    Refill:  1    Return in about 4 months (around 01/22/2014) for complete physical  examiniation.   Reginia Forts, M.D.  [facility] [address] [place], [place]  [address] [phone] phone [phone] fax

## 2013-09-22 NOTE — Addendum Note (Signed)
Addended by: Kem Boroughs D on: 09/22/2013 04:33 PM   Modules accepted: Orders, Medications

## 2013-09-26 ENCOUNTER — Telehealth: Payer: Self-pay

## 2013-09-26 NOTE — Telephone Encounter (Signed)
Dr. Tamala Julian, Opal [facility]les called and I told her about her labs and sent her a copy. She wanted me to let you know that she didn't have any questions.

## 2013-10-06 ENCOUNTER — Telehealth: Payer: Self-pay | Admitting: Obstetrics and Gynecology

## 2013-10-06 ENCOUNTER — Ambulatory Visit (INDEPENDENT_AMBULATORY_CARE_PROVIDER_SITE_OTHER): Payer: 59 | Admitting: Obstetrics and Gynecology

## 2013-10-06 ENCOUNTER — Encounter: Payer: Self-pay | Admitting: Obstetrics and Gynecology

## 2013-10-06 ENCOUNTER — Ambulatory Visit: Payer: 59 | Admitting: Obstetrics and Gynecology

## 2013-10-06 VITALS — BP 120/74 | HR 70 | Ht 62.0 in | Wt 157.0 lb

## 2013-10-06 DIAGNOSIS — F410 Panic disorder [episodic paroxysmal anxiety] without agoraphobia: Secondary | ICD-10-CM

## 2013-10-06 DIAGNOSIS — Z0000 Encounter for general adult medical examination without abnormal findings: Secondary | ICD-10-CM

## 2013-10-06 LAB — POCT URINALYSIS DIPSTICK
Bilirubin, UA: NEGATIVE
Blood, UA: NEGATIVE
Glucose, UA: NEGATIVE
KETONES UA: NEGATIVE
LEUKOCYTES UA: NEGATIVE
NITRITE UA: NEGATIVE
Protein, UA: NEGATIVE
UROBILINOGEN UA: NEGATIVE
pH, UA: 5

## 2013-10-06 NOTE — Telephone Encounter (Signed)
Pt was seen earlier today and had a panic attack. She is calling back to let Dr.Silva know she made it home safely and is doing fine.

## 2013-10-06 NOTE — Telephone Encounter (Signed)
Thank you.  I will close the encounter. 

## 2013-10-06 NOTE — Patient Instructions (Signed)
Please take your Xanax for anxiety but do not drive afterward.

## 2013-10-06 NOTE — Progress Notes (Signed)
Patient ID: Alisha Matthews, female   DOB: Sep 24, 1950, 63 y.o.   MRN: [id number] GYNECOLOGY VISIT  PCP:   Reginia Forts, MD  Referring provider:   HPI: 63 y.o.   Married  Caucasian  female   [id number] with Patient's last menstrual period was 06/22/2000.   here for  AEX.   History of right breast cancer.  Status post lumpectomy and radiation therapy.  Took tamoxifen for one month. Stopped due to lower extremity pain. Has varicose veins.   History of osteopenia.   During interview portion of visit, patient during discussion of her cancer began having a panic attack. Reported that her close friend was just diagnosed with advanced cancer and that she is drained from being her psychological support.  It is making patient worry more about her own cancer history.  States that she has had these before and that it feels exactly the same.  Last panic attack 1 1/2 years ago.  Reports history of anxiety and social anxiety for which she takes Paxil. Has Xanax that she also takes occasionally.   Reported midline epigastric pain, nonradiating that passed after about 20 minutes of observation.  No nausea, vomiting, dizziness, shortness of breath, or diaphoresis.  Denies history of angina, cardiac disease, or respiratory history.  States no one to call to come pick her up. Husband is a PA in [place].   Hgb:    PCP Urine:  Neg  GYNECOLOGIC HISTORY: Patient's last menstrual period was 06/22/2000. Sexually active:  no Partner preference: female Contraception:  postmenopausal  Menopausal hormone therapy: no DES exposure:   no Blood transfusions:  no  Sexually transmitted diseases:   HPV GYN procedures and prior surgeries:  D & C 1978 and Rt. Breast lumpectomy. Last mammogram: 05-11-13 YSA:[id number]                Last pap and high risk HPV testing:09-27-12 wnl:neg HR HPV History of abnormal pap smear:  Hx of ascus pap with CIN I and cryotherapy at 63 years old.  Follow up paps normal.    OB History    Grav Para Term Preterm Abortions TAB SAB Ect Mult Living   3 0   3 3 0   0       LIFESTYLE: Exercise:  Stair stepper and walking             Tobacco:   no Alcohol:      no Drug use:   no  OTHER HEALTH MAINTENANCE: Tetanus/TDap:  Up to date Gardisil:              n/a Influenza:            03/2013 Zostavax:             08-02-13  Bone density:      Had one and is slightly osteopenic but will not had another one. Colonoscopy:       2013 wnl with Dr. Norberto Sorenson stark.  Next colonoscopy due 2023.  Cholesterol check: 08/2013 wnl  Family History  Problem Relation Age of Onset  . Adopted: Yes  . Hypertension Mother   . Diabetes Mother   . Heart failure Mother   . Cancer Maternal Grandmother 55    non hodgkins lymphoma    Patient Active Problem List   Diagnosis Date Noted  . Other pancytopenia 05/01/2013  . Breast cancer of upper-inner quadrant of right female breast 02/21/2013  . Tinea capitis 10/31/2012  . Annual physical exam 08/08/2012  .  Type II or unspecified type diabetes mellitus without mention of complication, not stated as uncontrolled 08/08/2012  . Anxiety and depression 08/08/2012  . Pure hypercholesterolemia 08/08/2012   Past Medical History  Diagnosis Date  . Anxiety   . Arthritis   . Diabetes mellitus     borderline  . GERD (gastroesophageal reflux disease)   . Hyperlipidemia   . Insomnia, unspecified   . Leukocytopenia, unspecified   . Barrett's esophagus   . Breast fibrocystic disorder   . Genital warts   . Osteoarthrosis, unspecified whether generalized or localized, ankle and foot     ankle/foot  . Exercise induced bronchospasm   . Personal history of colonic polyps   . Migraine, unspecified, without mention of intractable migraine without mention of status migrainosus   . Symptomatic menopausal or female climacteric states   . Obesity, unspecified   . Essential hypertension, benign   . Depressive disorder, not elsewhere classified   . Anemia,  unspecified   . Chicken pox   . Measles   . Mumps   . Breast cancer     right breast invasive ductal carcinoma   . Infected postoperative breast seroma     Past Surgical History  Procedure Laterality Date  . Cholecystectomy    . Bunionectomy      3 surgeries on both feet  . Nasal septum surgery    . Total hip arthroplasty      left      Perthes disease  . Foot surgery      Left-revision  . Dilation and curettage of uterus  1978  . Breast lumpectomy with needle localization and axillary sentinel lymph node bx  06/08/2012    Procedure: BREAST LUMPECTOMY WITH NEEDLE LOCALIZATION AND AXILLARY SENTINEL LYMPH NODE BX;  Surgeon: Rolm Bookbinder, MD;  Location: [place];  Service: General;  Laterality: Right;  . Re-excision of breast cancer,superior margins  06/27/2012    Procedure: RE-EXCISION OF BREAST CANCER,SUPERIOR MARGINS;  Surgeon: Rolm Bookbinder, MD;  Location: [place];  Service: General;  Laterality: Right;    ALLERGIES: Penicillins  Current Outpatient Prescriptions  Medication Sig Dispense Refill  . albuterol (PROVENTIL HFA;VENTOLIN HFA) 108 (90 BASE) MCG/ACT inhaler Inhale 2 puffs into the lungs every 6 (six) hours as needed. Asthmatic attack  18 g  3  . ALPRAZolam (XANAX) 0.5 MG tablet Take 1 tablet (0.5 mg total) by mouth at bedtime as needed.  30 tablet  5  . atorvastatin (LIPITOR) 20 MG tablet Take 1 tablet (20 mg total) by mouth daily.  90 tablet  3  . clindamycin (CLEOCIN) 150 MG capsule Take 150 mg by mouth as needed. Takes 4 as needed prior to dental appt      . Diclofenac-Misoprostol 50-0.2 MG TBEC Take 1 tablet by mouth 3 (three) times daily.  270 tablet  2  . diphenhydrAMINE (BENADRYL) 25 MG tablet Take 25 mg by mouth at bedtime as needed. When not taking xanax.      . esomeprazole (NEXIUM) 40 MG capsule Take 1 capsule (40 mg total) by mouth 2 (two) times daily.  180 capsule  3  . frovatriptan (FROVA) 2.5 MG tablet Take 1 tablet  (2.5 mg total) by mouth as needed. If recurs, may repeat after 2 hours. Max of 3 tabs in 24 hours.  10 tablet  2  . lisinopril (PRINIVIL,ZESTRIL) 5 MG tablet Take 1 tablet (5 mg total) by mouth daily.  90 tablet  3  . metFORMIN (  GLUCOPHAGE XR) 500 MG 24 hr tablet Take 1 tablet (500 mg total) by mouth daily with breakfast.  90 tablet  1  . PARoxetine (PAXIL) 40 MG tablet Take 1 tablet (40 mg total) by mouth daily.  90 tablet  3  . zoster vaccine live, PF, (ZOSTAVAX) 29562 UNT/0.65ML injection Inject 19,400 Units into the skin once.  1 each  0   No current facility-administered medications for this visit.     ROS:  Pertinent items are noted in HPI.  SOCIAL HISTORY:    PHYSICAL EXAMINATION:    BP 120/74  Pulse 70  Ht 5\' 2"  (1.575 m)  Wt 157 lb (71.215 kg)  BMI 28.71 kg/m2  LMP 06/22/2000   Wt Readings from Last 3 Encounters:  10/06/13 157 lb (71.215 kg)  09/22/13 148 lb (67.132 kg)  07/31/13 156 lb 8 oz (70.988 kg)     Ht Readings from Last 3 Encounters:  10/06/13 5\' 2"  (1.575 m)  09/22/13 5' 2.5" (1.588 m)  07/31/13 5' 2.5" (1.588 m)   BP 138/80   Pulse 80 - during panic attack. General appearance: alert, cooperative and appears stated age.  Appears calm. Lungs: clear to auscultation bilaterally Heart: regular rate and rhythm Neurologic: Grossly normal  ASSESSMENT  Panic attack - resolved.  Chest discomfort - resolved.  PLAN  Patient escorted to check out.  States she is able to drive to her church to be with friends. To emergency department or call 911 if chest pain returns.  Xanax as prescribed prn.  Do not take Xanax and drive.  Will reschedule annual examination.   35 minutes face to face time at patient's bedside for interview and care during panic attack.     An After Visit Summary was printed and given to the patient.

## 2013-10-31 ENCOUNTER — Telehealth: Payer: Self-pay | Admitting: Oncology

## 2013-10-31 ENCOUNTER — Other Ambulatory Visit: Payer: Self-pay | Admitting: Hematology and Oncology

## 2013-10-31 ENCOUNTER — Other Ambulatory Visit (HOSPITAL_BASED_OUTPATIENT_CLINIC_OR_DEPARTMENT_OTHER): Payer: 59

## 2013-10-31 ENCOUNTER — Ambulatory Visit (HOSPITAL_BASED_OUTPATIENT_CLINIC_OR_DEPARTMENT_OTHER): Payer: 59 | Admitting: Physician Assistant

## 2013-10-31 VITALS — BP 107/72 | HR 86 | Temp 98.6°F | Resp 18 | Ht 62.0 in | Wt 155.5 lb

## 2013-10-31 DIAGNOSIS — C50919 Malignant neoplasm of unspecified site of unspecified female breast: Secondary | ICD-10-CM

## 2013-10-31 DIAGNOSIS — C50219 Malignant neoplasm of upper-inner quadrant of unspecified female breast: Secondary | ICD-10-CM

## 2013-10-31 DIAGNOSIS — Z170 Estrogen receptor positive status [ER+]: Secondary | ICD-10-CM

## 2013-10-31 LAB — CBC WITH DIFFERENTIAL/PLATELET
BASO%: 0.8 % (ref 0.0–2.0)
Basophils Absolute: 0 10*3/uL (ref 0.0–0.1)
EOS%: 4 % (ref 0.0–7.0)
Eosinophils Absolute: 0.1 10*3/uL (ref 0.0–0.5)
HEMATOCRIT: 36.2 % (ref 34.8–46.6)
HGB: 11.9 g/dL (ref 11.6–15.9)
LYMPH#: 1.1 10*3/uL (ref 0.9–3.3)
LYMPH%: 41.8 % (ref 14.0–49.7)
MCH: 29.5 pg (ref 25.1–34.0)
MCHC: 33 g/dL (ref 31.5–36.0)
MCV: 89.5 fL (ref 79.5–101.0)
MONO#: 0.2 10*3/uL (ref 0.1–0.9)
MONO%: 9.6 % (ref 0.0–14.0)
NEUT#: 1.1 10*3/uL — ABNORMAL LOW (ref 1.5–6.5)
NEUT%: 43.8 % (ref 38.4–76.8)
Platelets: 174 10*3/uL (ref 145–400)
RBC: 4.04 10*6/uL (ref 3.70–5.45)
RDW: 13.1 % (ref 11.2–14.5)
WBC: 2.6 10*3/uL — AB (ref 3.9–10.3)

## 2013-10-31 LAB — COMPREHENSIVE METABOLIC PANEL (CC13)
ALT: 34 U/L (ref 0–55)
AST: 28 U/L (ref 5–34)
Albumin: 3.8 g/dL (ref 3.5–5.0)
Alkaline Phosphatase: 71 U/L (ref 40–150)
Anion Gap: 11 mEq/L (ref 3–11)
BILIRUBIN TOTAL: 0.35 mg/dL (ref 0.20–1.20)
BUN: 8.8 mg/dL (ref 7.0–26.0)
CHLORIDE: 105 meq/L (ref 98–109)
CO2: 25 mEq/L (ref 22–29)
Calcium: 9.7 mg/dL (ref 8.4–10.4)
Creatinine: 0.7 mg/dL (ref 0.6–1.1)
Glucose: 98 mg/dl (ref 70–140)
Potassium: 4.3 mEq/L (ref 3.5–5.1)
SODIUM: 141 meq/L (ref 136–145)
TOTAL PROTEIN: 6.7 g/dL (ref 6.4–8.3)

## 2013-10-31 NOTE — Progress Notes (Signed)
Patient ID: Alisha Matthews, female   DOB: [birth year], 63 y.o.   MRN: [id number] ID: Alisha Matthews   DOB: 05-May-1951  MR#: [id number]  CBJ#:[id number]  PCP: Reginia Forts, MD GYN: Elyse Hsu SU: Rolm Bookbinder OTHER MD: Christene Slates, Lucio Edward   HISTORY OF PRESENT ILLNESS: The patient had screening mammography at [facility] showing a worrisome area of calcifications.. I do not have a copy of that study or the following diagnostic mammography, but on 05/12/2012 the patient underwent right breast biopsy showing ([id number]) and invasive ductal carcinoma, E-cadherin positive, estrogen receptor 100% and progesterone receptor 22% positive, with an MIB-1 of 6%, and no amplification of HER-2.  On 05/20/2012 the patient underwent bilateral breast MRI, and this showed, on the right side, and 25 mm area of moderate enhancement, and on the left a 10 mm area of clumped linear enhancement. There was no abnormal lymphadenopathy noted. In the right lobe of the liver a 13 mm hyperintense lesion was incidentally noted.  On 05/24/2012 the patient underwent abdominal ultrasonography showing a complex cyst in the right liver lobe measuring 1.7 cm. There was no solid component. The liver was otherwise minimally inhomogeneous. On 05/27/2012 the patient underwent biopsy of this suspicious area on the left breast, and this turned out to be a fibroadenoma ([id number]).  Accordingly on 06/08/2012 the patient underwent left lumpectomy and sentinel lymph node sampling under [facility]. 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 Both sentinel lymph nodes were clear. Further surgery for margin clearance is planned prior to definitive radiation treatments. The patient's subsequent history is as detailed below.  INTERVAL HISTORY: Alisha Matthews returns today for followup of her breast cancer. When she was seen 3 months ago she had diffuse pain  throughout her breasts. However, once she eliminated caffeine from her diet the pain ceased. She does have a history of mild fibrocystic disease. Marland Kitchen  REVIEW OF SYSTEMS: General: no weight loss, fevers, chills, fatigue or unexplained bruising or bleeding. HEENT: No headaches, dizziness, loss of balance, hallucinations  LUNGS: no shortness of breath, dyspnea, hemoptysis, or wheezing CARDIOVASCULAR: no heart palpitations, chest pain, or pressure GI/GU:  no abdominal pain, nausea, vomiting, constipation, diarrhea, or blood in stool or urine   EXT: no swelling, numbness, cyanosis or tingling  ROS otherwise negative.   PAST MEDICAL HISTORY: Past Medical History  Diagnosis Date  . Anxiety   . Arthritis   . Diabetes mellitus     borderline  . GERD (gastroesophageal reflux disease)   . Hyperlipidemia   . Insomnia, unspecified   . Leukocytopenia, unspecified   . Barrett's esophagus   . Breast fibrocystic disorder   . Genital warts   . Osteoarthrosis, unspecified whether generalized or localized, ankle and foot     ankle/foot  . Exercise induced bronchospasm   . Personal history of colonic polyps   . Migraine, unspecified, without mention of intractable migraine without mention of status migrainosus   . Symptomatic menopausal or female climacteric states   . Obesity, unspecified   . Essential hypertension, benign   . Depressive disorder, not elsewhere classified   . Anemia, unspecified   . Chicken pox   . Measles   . Mumps   . Breast cancer     right breast invasive ductal carcinoma   . Infected postoperative breast seroma     PAST SURGICAL HISTORY: Past Surgical History  Procedure Laterality Date  . Cholecystectomy    .  Bunionectomy      3 surgeries on both feet  . Nasal septum surgery    . Total hip arthroplasty      left      Perthes disease  . Foot surgery      Left-revision  . Dilation and curettage of uterus  1978  . Breast lumpectomy with needle localization and  axillary sentinel lymph node bx  06/08/2012    Procedure: BREAST LUMPECTOMY WITH NEEDLE LOCALIZATION AND AXILLARY SENTINEL LYMPH NODE BX;  Surgeon: Rolm Bookbinder, MD;  Location: [place];  Service: General;  Laterality: Right;  . Re-excision of breast cancer,superior margins  06/27/2012    Procedure: RE-EXCISION OF BREAST CANCER,SUPERIOR MARGINS;  Surgeon: Rolm Bookbinder, MD;  Location: [place];  Service: General;  Laterality: Right;    FAMILY HISTORY Family History  Problem Relation Age of Onset  . Adopted: Yes  . Hypertension Mother   . Diabetes Mother   . Heart failure Mother   . Cancer Maternal Grandmother 75    non hodgkins lymphoma   the patient knows little about her father, other than the fact that he died at the age of 83, causes unknown to her. The patient's mother died from complications of diabetes at the age of 51. The patient has 2 full and one half brother. She has no sisters. There is no history of breast or ovarian cancer in the family.  GYNECOLOGIC HISTORY: Menarche age 42. She is GX P0. Menopause approximately 2001. She never took hormone replacement.  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 Her husband Bronwen Betters Wheelwright is a Conservation officer, historic buildings in a pain management clinic in [place]. Lillianah has told me the story of his ascending aortic dissecting aneurysm and his miraculous recovery. At home with them are numerous cats. Also 1 dog.   ADVANCED DIRECTIVES: Alisha Matthews has a living will and her husband is her healthcare power of attorney.  HEALTH MAINTENANCE: History  Substance Use Topics  . Smoking status: Never Smoker   . Smokeless tobacco: Never Used  . Alcohol Use: No     Colonoscopy: 2013, under Lucio Edward  PAP: November 2012  Bone density: 2004, showing osteopenia  Lipid panel:  Allergies   Allergen Reactions  . Penicillins Rash    Current Outpatient Prescriptions  Medication Sig Dispense Refill  . albuterol (PROVENTIL HFA;VENTOLIN HFA) 108 (90 BASE) MCG/ACT inhaler Inhale 2 puffs into the lungs every 6 (six) hours as needed. Asthmatic attack  18 g  3  . ALPRAZolam (XANAX) 0.5 MG tablet Take 1 tablet (0.5 mg total) by mouth at bedtime as needed.  30 tablet  5  . atorvastatin (LIPITOR) 20 MG tablet Take 1 tablet (20 mg total) by mouth daily.  90 tablet  3  . clindamycin (CLEOCIN) 150 MG capsule Take 150 mg by mouth as needed. Takes 4 as needed prior to dental appt      . Diclofenac-Misoprostol 50-0.2 MG TBEC Take 1 tablet by mouth 3 (three) times daily.  270 tablet  2  . diphenhydrAMINE (BENADRYL) 25 MG tablet Take 25 mg by mouth at bedtime as needed. When not taking xanax.      . esomeprazole (NEXIUM) 40 MG capsule Take 1 capsule (40 mg total) by mouth 2 (two) times daily.  180 capsule  3  . frovatriptan (FROVA) 2.5 MG  tablet Take 1 tablet (2.5 mg total) by mouth as needed. If recurs, may repeat after 2 hours. Max of 3 tabs in 24 hours.  10 tablet  2  . lisinopril (PRINIVIL,ZESTRIL) 5 MG tablet Take 1 tablet (5 mg total) by mouth daily.  90 tablet  3  . metFORMIN (GLUCOPHAGE XR) 500 MG 24 hr tablet Take 1 tablet (500 mg total) by mouth daily with breakfast.  90 tablet  1  . PARoxetine (PAXIL) 40 MG tablet Take 1 tablet (40 mg total) by mouth daily.  90 tablet  3  . zoster vaccine live, PF, (ZOSTAVAX) 07371 UNT/0.65ML injection Inject 19,400 Units into the skin once.  1 each  0   No current facility-administered medications for this visit.    OBJECTIVE: Middle-aged white woman in no acute distress  Filed Vitals:   10/31/13 0934  BP: 107/72  Pulse: 86  Temp: 98.6 F (37 C)  Resp: 18     Body mass index is 28.43 kg/(m^2).    ECOG FS: 0  Sclerae unicteric, pupils equal round and reactive to light Oropharynx clear No cervical or supraclavicular,  adenopathy Lungs no  rales or rhonchi Heart regular rate and rhythm, no murmur appreciated Abd soft, nontender, positive bowel sounds MSK no focal spinal tenderness, no peripheral edema Neuro: nonfocal, well oriented, pleasant affect Breasts: The right breast is status post lumpectomy and radiation. There is minimal tenderness to palpation. There is the expected irregularity in the contour; there is no evidence of local recurrence.; she does have a small  mild seroma which has been stable for a year per the patient. The left breast is unremarkable.    LAB RESULTS: Lab Results  Component Value Date   WBC 2.6* 10/31/2013   NEUTROABS 1.1* 10/31/2013   HGB 11.9 10/31/2013   HCT 36.2 10/31/2013   MCV 89.5 10/31/2013   PLT 174 10/31/2013      Chemistry      Component Value Date/Time   NA 141 10/31/2013 0909   NA 135 09/22/2013 1124   NA 139 11/04/2011   K 4.3 10/31/2013 0909   K 3.9 09/22/2013 1124   CL 102 09/22/2013 1124   CL 102 06/23/2012 1618   CO2 25 10/31/2013 0909   CO2 26 09/22/2013 1124   BUN 8.8 10/31/2013 0909   BUN 8 09/22/2013 1124   BUN 6 11/04/2011   CREATININE 0.7 10/31/2013 0909   CREATININE 0.63 09/22/2013 1124   CREATININE 0.7 11/04/2011   GLU 101 11/04/2011      Component Value Date/Time   CALCIUM 9.7 10/31/2013 0909   CALCIUM 8.9 09/22/2013 1124   ALKPHOS 71 10/31/2013 0909   ALKPHOS 57 09/22/2013 1124   AST 28 10/31/2013 0909   AST 25 09/22/2013 1124   ALT 34 10/31/2013 0909   ALT 23 09/22/2013 1124   BILITOT 0.35 10/31/2013 0909   BILITOT 0.5 09/22/2013 1124       Lab Results  Component Value Date   LABCA2 23 06/03/2012    No components found with this basename: LABCA125    No results found for this basename: INR,  in the last 168 hours  Urinalysis No results found for this basename: colorurine,  appearanceur,  labspec,  phurine,  glucoseu,  hgbur,  bilirubinur,  ketonesur,  proteinur,  urobilinogen,  nitrite,  leukocytesur    STUDIES: No results found.   ASSESSMENT: 63 y.o. 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   She is clinical asymptomatic.   PLAN: She is doing well overall - she is mildly concerned about her WBC being a little bit on the low side although she states she has had periodic low WBC's over the past 25 years with no known etiology. She feels great otherwise and I see nothing in her clinical exam to indicate a need for further investigation, especially given her history of this for over 2 decades. She would like to return in 3 months, although I have encouraged her that she can be seen at 6 month intervals at this time. She plans to return for labs, physical exam and system review in 3 months. If she has labs done incidentally elsewhere in the interim she may reschedule her appt for 6 months.   She knows to call for any problems that may develop before next visit here.  Bill Salinas    10/31/2013

## 2013-10-31 NOTE — Telephone Encounter (Signed)
, °

## 2014-01-15 DIAGNOSIS — M25569 Pain in unspecified knee: Secondary | ICD-10-CM | POA: Diagnosis not present

## 2014-01-22 ENCOUNTER — Ambulatory Visit: Payer: 59 | Admitting: Family Medicine

## 2014-01-29 ENCOUNTER — Other Ambulatory Visit: Payer: Self-pay | Admitting: Orthopaedic Surgery

## 2014-02-06 ENCOUNTER — Other Ambulatory Visit (HOSPITAL_BASED_OUTPATIENT_CLINIC_OR_DEPARTMENT_OTHER): Payer: Medicare Other

## 2014-02-06 ENCOUNTER — Ambulatory Visit (HOSPITAL_BASED_OUTPATIENT_CLINIC_OR_DEPARTMENT_OTHER): Payer: Medicare Other | Admitting: Oncology

## 2014-02-06 ENCOUNTER — Telehealth: Payer: Self-pay | Admitting: Oncology

## 2014-02-06 ENCOUNTER — Telehealth: Payer: Self-pay

## 2014-02-06 VITALS — BP 124/66 | HR 64 | Temp 98.3°F | Resp 20 | Ht 62.0 in | Wt 161.0 lb

## 2014-02-06 DIAGNOSIS — F3289 Other specified depressive episodes: Secondary | ICD-10-CM | POA: Diagnosis not present

## 2014-02-06 DIAGNOSIS — M810 Age-related osteoporosis without current pathological fracture: Secondary | ICD-10-CM

## 2014-02-06 DIAGNOSIS — C50919 Malignant neoplasm of unspecified site of unspecified female breast: Secondary | ICD-10-CM

## 2014-02-06 DIAGNOSIS — Z853 Personal history of malignant neoplasm of breast: Secondary | ICD-10-CM

## 2014-02-06 DIAGNOSIS — C50211 Malignant neoplasm of upper-inner quadrant of right female breast: Secondary | ICD-10-CM

## 2014-02-06 DIAGNOSIS — R5381 Other malaise: Secondary | ICD-10-CM | POA: Diagnosis not present

## 2014-02-06 DIAGNOSIS — E119 Type 2 diabetes mellitus without complications: Secondary | ICD-10-CM

## 2014-02-06 DIAGNOSIS — F329 Major depressive disorder, single episode, unspecified: Secondary | ICD-10-CM

## 2014-02-06 DIAGNOSIS — R5383 Other fatigue: Secondary | ICD-10-CM | POA: Diagnosis not present

## 2014-02-06 LAB — COMPREHENSIVE METABOLIC PANEL (CC13)
ALBUMIN: 3.7 g/dL (ref 3.5–5.0)
ALT: 23 U/L (ref 0–55)
AST: 25 U/L (ref 5–34)
Alkaline Phosphatase: 55 U/L (ref 40–150)
Anion Gap: 7 mEq/L (ref 3–11)
BUN: 11.1 mg/dL (ref 7.0–26.0)
CALCIUM: 9.4 mg/dL (ref 8.4–10.4)
CHLORIDE: 104 meq/L (ref 98–109)
CO2: 28 meq/L (ref 22–29)
CREATININE: 0.8 mg/dL (ref 0.6–1.1)
GLUCOSE: 64 mg/dL — AB (ref 70–140)
POTASSIUM: 4.2 meq/L (ref 3.5–5.1)
Sodium: 139 mEq/L (ref 136–145)
Total Bilirubin: 0.3 mg/dL (ref 0.20–1.20)
Total Protein: 6.7 g/dL (ref 6.4–8.3)

## 2014-02-06 LAB — CBC WITH DIFFERENTIAL/PLATELET
BASO%: 1.1 % (ref 0.0–2.0)
BASOS ABS: 0 10*3/uL (ref 0.0–0.1)
EOS ABS: 0.2 10*3/uL (ref 0.0–0.5)
EOS%: 6.2 % (ref 0.0–7.0)
HEMATOCRIT: 35.6 % (ref 34.8–46.6)
HEMOGLOBIN: 11.9 g/dL (ref 11.6–15.9)
LYMPH#: 1.5 10*3/uL (ref 0.9–3.3)
LYMPH%: 42.2 % (ref 14.0–49.7)
MCH: 29.4 pg (ref 25.1–34.0)
MCHC: 33.4 g/dL (ref 31.5–36.0)
MCV: 87.9 fL (ref 79.5–101.0)
MONO#: 0.4 10*3/uL (ref 0.1–0.9)
MONO%: 10.5 % (ref 0.0–14.0)
NEUT#: 1.4 10*3/uL — ABNORMAL LOW (ref 1.5–6.5)
NEUT%: 40 % (ref 38.4–76.8)
Platelets: 163 10*3/uL (ref 145–400)
RBC: 4.05 10*6/uL (ref 3.70–5.45)
RDW: 12.9 % (ref 11.2–14.5)
WBC: 3.5 10*3/uL — AB (ref 3.9–10.3)
nRBC: 0 % (ref 0–0)

## 2014-02-06 NOTE — Progress Notes (Signed)
Patient ID: Alisha Matthews, female   DOB: 06/03/1951, 63 y.o.   MRN: [id number] ID: [name]   DOB: 1950/08/28  MR#: [id number]  MOL#:[id number]  PCP: Reginia Forts, MD GYN: Elyse Hsu SU: Rolm Bookbinder OTHER MD: Christene Slates, Lucio Edward, Melrose Nakayama  DIAGNOSIS: Right breast CA  CHIEF COMPLAINT: Breast cancer surveillance  HISTORY OF PRESENT ILLNESS: As mentioned in previous note:   The patient had screening mammography at [facility] showing a worrisome area of calcifications.. I do not have a copy of that study or the following diagnostic mammography, but on 05/12/2012 the patient underwent right breast biopsy showing ([id number]) and invasive ductal carcinoma, E-cadherin positive, estrogen receptor 100% and progesterone receptor 22% positive, with an MIB-1 of 6%, and no amplification of HER-2.  On 05/20/2012 the patient underwent bilateral breast MRI, and this showed, on the right side, and 25 mm area of moderate enhancement, and on the left a 10 mm area of clumped linear enhancement. There was no abnormal lymphadenopathy noted. In the right lobe of the liver a 13 mm hyperintense lesion was incidentally noted.  On 05/24/2012 the patient underwent abdominal ultrasonography showing a complex cyst in the right liver lobe measuring 1.7 cm. There was no solid component. The liver was otherwise minimally inhomogeneous. On 05/27/2012 the patient underwent biopsy of this suspicious area on the left breast, and this turned out to be a fibroadenoma ([id number]).  Accordingly on 06/08/2012 the patient underwent left lumpectomy and sentinel lymph node sampling under [facility]. 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 Both sentinel lymph nodes were clear. Further surgery for margin clearance is planned prior to definitive radiation treatments.  The patient's subsequent history is as detailed  below.  INTERVAL HISTORY: [name] returns today for followup visit of her breast cancer accompanied by her husband Shanon Brow. The interval history is significant chiefly for having worsening left knee problems, to the point that she will need a total left knee replacement next month. She is very anxious about this procedure. However it is clearly necessary, as she is unable to exercise because of the knee problem.   REVIEW OF SYSTEMS: She describes herself as moderately fatigued, but adds that it may be due to depression. She says she is depressed chiefly because of the need for surgery, and because of anxiety as to what may happen to [place] when she is not there. (They also have 5 tabs as she worries about them to. Aside from the left knee pain in the depression, she has had some right breast pain which went away when she stopped caffeinated drinks. A detailed review of systems today was otherwise entirely stable.  PAST MEDICAL HISTORY: Past Medical History  Diagnosis Date  . Anxiety   . Arthritis   . Diabetes mellitus     borderline  . GERD (gastroesophageal reflux disease)   . Hyperlipidemia   . Insomnia, unspecified   . Leukocytopenia, unspecified   . Barrett's esophagus   . Breast fibrocystic disorder   . Genital warts   . Osteoarthrosis, unspecified whether generalized or localized, ankle and foot     ankle/foot  . Exercise induced bronchospasm   . Personal history of colonic polyps   . Migraine, unspecified, without mention of intractable migraine without mention of status migrainosus   . Symptomatic menopausal or female climacteric states   . Obesity, unspecified   . Essential hypertension, benign   .  Depressive disorder, not elsewhere classified   . Anemia, unspecified   . Chicken pox   . Measles   . Mumps   . Breast cancer     right breast invasive ductal carcinoma   . Infected postoperative breast seroma     PAST SURGICAL HISTORY: Past Surgical History  Procedure  Laterality Date  . Cholecystectomy    . Bunionectomy      3 surgeries on both feet  . Nasal septum surgery    . Total hip arthroplasty      left      Perthes disease  . Foot surgery      Left-revision  . Dilation and curettage of uterus  1978  . Breast lumpectomy with needle localization and axillary sentinel lymph node bx  06/08/2012    Procedure: BREAST LUMPECTOMY WITH NEEDLE LOCALIZATION AND AXILLARY SENTINEL LYMPH NODE BX;  Surgeon: Rolm Bookbinder, MD;  Location: [place];  Service: General;  Laterality: Right;  . Re-excision of breast cancer,superior margins  06/27/2012    Procedure: RE-EXCISION OF BREAST CANCER,SUPERIOR MARGINS;  Surgeon: Rolm Bookbinder, MD;  Location: [place];  Service: General;  Laterality: Right;    FAMILY HISTORY Family History  Problem Relation Age of Onset  . Adopted: Yes  . Hypertension Mother   . Diabetes Mother   . Heart failure Mother   . Cancer Maternal Grandmother 4    non hodgkins lymphoma   the patient knows little about her father, other than the fact that he died at the age of 15, causes unknown to her. The patient's mother died from complications of diabetes at the age of 47. The patient has 2 full and one half brother. She has no sisters. There is no history of breast or ovarian cancer in the family.  GYNECOLOGIC HISTORY: Menarche age 82. She is GX P0. Menopause approximately 2001. She never took hormone replacement.  SOCIAL HISTORY: She has worked as a Merchandiser, retail and was involved with the [place] transplant program while I was there. Her husband Bronwen Betters Tedesco is a Conservation officer, historic buildings in a pain management clinic in [place]. Leroy has told me the story of his ascending aortic dissecting aneurysm and his miraculous recovery. At home with them are numerous cats. Also 1 dog.   ADVANCED DIRECTIVES: Shanikqua has a living will and her husband is her healthcare power of attorney.  HEALTH  MAINTENANCE: History  Substance Use Topics  . Smoking status: Never Smoker   . Smokeless tobacco: Never Used  . Alcohol Use: No     Colonoscopy: 2013, under Lucio Edward  PAP: November 2012  Bone density: 2004, showing osteopenia  Lipid panel:  Allergies  Allergen Reactions  . Penicillins Rash    Current Outpatient Prescriptions  Medication Sig Dispense Refill  . albuterol (PROVENTIL HFA;VENTOLIN HFA) 108 (90 BASE) MCG/ACT inhaler Inhale 2 puffs into the lungs every 6 (six) hours as needed. Asthmatic attack  18 g  3  . ALPRAZolam (XANAX) 0.5 MG tablet Take 1 tablet (0.5 mg total) by mouth at bedtime as needed.  30 tablet  5  . atorvastatin (LIPITOR) 20 MG tablet Take 1 tablet (20 mg total) by mouth daily.  90 tablet  3  . clindamycin (CLEOCIN) 150 MG capsule Take 150 mg by mouth as needed. Takes 4 as needed prior to dental appt      . Diclofenac-Misoprostol 50-0.2 MG TBEC Take 1 tablet by mouth 3 (three) times daily.  Excel  tablet  2  . diphenhydrAMINE (BENADRYL) 25 MG tablet Take 25 mg by mouth at bedtime as needed. When not taking xanax.      . esomeprazole (NEXIUM) 40 MG capsule Take 1 capsule (40 mg total) by mouth 2 (two) times daily.  180 capsule  3  . frovatriptan (FROVA) 2.5 MG tablet Take 1 tablet (2.5 mg total) by mouth as needed. If recurs, may repeat after 2 hours. Max of 3 tabs in 24 hours.  10 tablet  2  . lisinopril (PRINIVIL,ZESTRIL) 5 MG tablet Take 1 tablet (5 mg total) by mouth daily.  90 tablet  3  . metFORMIN (GLUCOPHAGE XR) 500 MG 24 hr tablet Take 1 tablet (500 mg total) by mouth daily with breakfast.  90 tablet  1  . PARoxetine (PAXIL) 40 MG tablet Take 1 tablet (40 mg total) by mouth daily.  90 tablet  3  . zoster vaccine live, PF, (ZOSTAVAX) 70786 UNT/0.65ML injection Inject 19,400 Units into the skin once.  1 each  0   No current facility-administered medications for this visit.    OBJECTIVE: Middle-aged white woman who appears stated age 49 Vitals:    02/06/14 0946  BP: 124/66  Pulse: 64  Temp: 98.3 F (36.8 C)  Resp: 20     Body mass index is 29.44 kg/(m^2).    ECOG FS: 2  Sclerae unicteric, pupils equal and reactive Oropharynx clear and moist No cervical or supraclavicular adenopathy Lungs no rales or rhonchi Heart regular rate and rhythm Abd soft, nontender, positive bowel sounds MSK no focal spinal tenderness, no upper extremity lymphedema; valgus deformity left lower extremity at the knee Neuro: nonfocal, well oriented, appropriate affect Breasts: The right breast is status post lumpectomy and radiation.  There is no evidence of local recurrence. The right axilla is benign. The left breast is unremarkable.  LAB RESULTS: Lab Results  Component Value Date   WBC 3.5* 02/06/2014   NEUTROABS 1.4* 02/06/2014   HGB 11.9 02/06/2014   HCT 35.6 02/06/2014   MCV 87.9 02/06/2014   PLT 163 02/06/2014      Chemistry      Component Value Date/Time   NA 139 02/06/2014 0926   NA 135 09/22/2013 1124   NA 139 11/04/2011   K 4.2 02/06/2014 0926   K 3.9 09/22/2013 1124   CL 102 09/22/2013 1124   CL 102 06/23/2012 1618   CO2 28 02/06/2014 0926   CO2 26 09/22/2013 1124   BUN 11.1 02/06/2014 0926   BUN 8 09/22/2013 1124   BUN 6 11/04/2011   CREATININE 0.8 02/06/2014 0926   CREATININE 0.63 09/22/2013 1124   CREATININE 0.7 11/04/2011   GLU 101 11/04/2011      Component Value Date/Time   CALCIUM 9.4 02/06/2014 0926   CALCIUM 8.9 09/22/2013 1124   ALKPHOS 55 02/06/2014 0926   ALKPHOS 57 09/22/2013 1124   AST 25 02/06/2014 0926   AST 25 09/22/2013 1124   ALT 23 02/06/2014 0926   ALT 23 09/22/2013 1124   BILITOT 0.30 02/06/2014 0926   BILITOT 0.5 09/22/2013 1124       Lab Results  Component Value Date   LABCA2 23 06/03/2012    No components found with this basename: LABCA125    No results found for this basename: INR,  in the last 168 hours  Urinalysis No results found for this basename: colorurine,  appearanceur,  labspec,  phurine,  glucoseu,  hgbur,   bilirubinur,  ketonesur,  proteinur,  urobilinogen,  nitrite,  leukocytesur    STUDIES: No results found.   ASSESSMENT: 63 y.o. [place] woman status post right breast biopsy December 2013 for a pT1a pN0, stage IA invasive ductal carcinoma, grade 2, estrogen receptor 100% positive, progesterone receptor 22% positive, with an MIB-1 of 6% and no HER-2 amplification  (1) completed adjuvant radiation 09/19/2012  (2) started tamoxifen in June 2014 but discontinued it after a month because of poor tolerance (varicosities)  PLAN:  I think Zakara is fine to proceed to her left total knee replacement and I have encouraged her to rhab thoroughly so she can start a vigorous exercise program postop.  She had osteopenia diagnosed by a bone density about 10 years ago but does not want that test repeated because she has decided in advance that she does not want any of the possible treatments. She is on adequate calcium and vitamin D supplementation.  We again reviewed the possibility of using aromatase inhibitors. However she had a T1a, microinvasive breast cancer. NCCN guidelines only recommend "consider endocrine treatment". Her risk of recurrence is sufficiently low that I do not think it is unreasonable for her to not use an aromatase inhibitor, although she knows my preference would be for her to be both on an aromatase inhibitor and a bisphosphonate.  She will see me again in December after her next mammogram. We will follow her on a yearly basis thereafter. Jaquavis Felmlee C     02/06/2014

## 2014-02-06 NOTE — Telephone Encounter (Signed)
Chart reviewed; oncology note reviewed and labs from 02/06/14 reviewed.  Appreciate update.  Agree with follow-up in November.  Please thank Shawntavia for update; I was just thinking about her the other day.

## 2014-02-06 NOTE — Telephone Encounter (Signed)
, °

## 2014-02-06 NOTE — Telephone Encounter (Signed)
Dr Alisha Matthews, Alisha Matthews wanted me to give you the following updates: she saw Dr Alisha Matthews, oncologist, for check up and had CBC and CMET done. All was normal with the visit/labs. She is having L knee total replacement on Sept 1 by Dr [facility] at [facility]. She hasn't been in recently bc she has been having so much lab work done other places, should be able to see in [place]. Plans to come in end of year, prob in Nov after rehab for knee. She doesn't need any medications at this time.

## 2014-02-06 NOTE — Telephone Encounter (Signed)
Spoke with pt. Notified.

## 2014-02-08 ENCOUNTER — Encounter (INDEPENDENT_AMBULATORY_CARE_PROVIDER_SITE_OTHER): Payer: Self-pay | Admitting: General Surgery

## 2014-02-12 NOTE — Pre-Procedure Instructions (Signed)
Harshitha Fretz Clabaugh  02/12/2014   Your procedure is scheduled on:  Tuesday February 20, 2014 at 7:30 AM.  Report to [facility] Admitting at 5:30 AM.  Call this number if you have problems the morning of surgery: [phone]   Remember:   Do not eat food or drink liquids after midnight.   Take these medicines the morning of surgery with A SIP OF WATER: Albuterol inhaler if needed, Esomeprazole (Nexium), Frovatriptan (Frova) if needed, Paroxetine (Paxil)   Do NOT take any diabetic medications the morning of your surgery   Do not wear jewelry, make-up or nail polish.  Do not wear lotions, powders, or perfumes.   Do not shave 48 hours prior to surgery.   Do not bring valuables to the hospital.  [facility] is not responsible for any belongings or valuables.               Contacts, dentures or bridgework may not be worn into surgery.  Leave suitcase in the car. After surgery it may be brought to your room.  For patients admitted to the hospital, discharge time is determined by your treatment team.               Patients discharged the day of surgery will not be allowed to drive home.  Name and phone number of your driver: Family/Friend  Special Instructions: Shower the night before and the morning of your surgery using CHG soap   Please read over the following fact sheets that you were given: Pain Booklet, Coughing and Deep Breathing, Blood Transfusion Information, Total Joint Packet, MRSA Information and Surgical Site Infection Prevention

## 2014-02-13 ENCOUNTER — Encounter (HOSPITAL_COMMUNITY)
Admission: RE | Admit: 2014-02-13 | Discharge: 2014-02-13 | Disposition: A | Discharge status: Home / Self Care | Payer: Medicare Other | Source: Ambulatory Visit | Attending: Orthopaedic Surgery | Admitting: Orthopaedic Surgery

## 2014-02-13 ENCOUNTER — Encounter (HOSPITAL_COMMUNITY): Payer: Self-pay

## 2014-02-13 DIAGNOSIS — Z01818 Encounter for other preprocedural examination: Secondary | ICD-10-CM | POA: Diagnosis not present

## 2014-02-13 DIAGNOSIS — IMO0002 Reserved for concepts with insufficient information to code with codable children: Secondary | ICD-10-CM | POA: Diagnosis not present

## 2014-02-13 DIAGNOSIS — M1710 Unilateral primary osteoarthritis, unspecified knee: Secondary | ICD-10-CM | POA: Diagnosis not present

## 2014-02-13 HISTORY — DX: Bronchitis, not specified as acute or chronic: J40

## 2014-02-13 HISTORY — DX: Other constipation: K59.09

## 2014-02-13 LAB — APTT: aPTT: 29 seconds (ref 24–37)

## 2014-02-13 LAB — URINE MICROSCOPIC-ADD ON

## 2014-02-13 LAB — URINALYSIS, ROUTINE W REFLEX MICROSCOPIC
BILIRUBIN URINE: NEGATIVE
GLUCOSE, UA: NEGATIVE mg/dL
Hgb urine dipstick: NEGATIVE
KETONES UR: NEGATIVE mg/dL
Nitrite: NEGATIVE
Protein, ur: NEGATIVE mg/dL
Specific Gravity, Urine: 1.024 (ref 1.005–1.030)
Urobilinogen, UA: 0.2 mg/dL (ref 0.0–1.0)
pH: 6 (ref 5.0–8.0)

## 2014-02-13 LAB — TYPE AND SCREEN
ABO/RH(D): AB POS
Antibody Screen: NEGATIVE

## 2014-02-13 LAB — PROTIME-INR
INR: 0.94 (ref 0.00–1.49)
PROTHROMBIN TIME: 12.6 s (ref 11.6–15.2)

## 2014-02-13 LAB — SURGICAL PCR SCREEN
MRSA, PCR: NEGATIVE
Staphylococcus aureus: NEGATIVE

## 2014-02-13 LAB — ABO/RH: ABO/RH(D): AB POS

## 2014-02-13 NOTE — Progress Notes (Signed)
Patient informed Nurse that she had a stress test approximately 7 years ago with Dr. Einar Gip. Patient denied having any acute cardiac or pulmonary issues.

## 2014-02-19 MED ORDER — VANCOMYCIN HCL IN DEXTROSE 1-5 GM/200ML-% IV SOLN
1000.0000 mg | INTRAVENOUS | Status: AC
  Administered 2014-02-20: 1000 mg via INTRAVENOUS
  Filled 2014-02-19: qty 200

## 2014-02-19 NOTE — H&P (Signed)
TOTAL KNEE ADMISSION H&P  Patient is being admitted for left total knee arthroplasty.  Subjective:  Chief Complaint:left knee pain.  HPI: Alisha Matthews, 63 y.o. female, has a history of pain and functional disability in the left knee due to arthritis and has failed non-surgical conservative treatments for greater than 12 weeks to includeNSAID's and/or analgesics, corticosteriod injections, flexibility and strengthening excercises, supervised PT with diminished ADL's post treatment, weight reduction as appropriate and activity modification.  Onset of symptoms was gradual, starting 6 years ago with gradually worsening course since that time. The patient noted no past surgery on the left knee(s).  Patient currently rates pain in the left knee(s) at 9 out of 10 with activity. Patient has night pain, worsening of pain with activity and weight bearing, pain that interferes with activities of daily living, pain with passive range of motion and crepitus.  Patient has evidence of subchondral sclerosis, periarticular osteophytes and joint space narrowing by imaging studies. This patient has had no. There is no active infection.  Patient Active Problem List   Diagnosis Date Noted  . Other pancytopenia 05/01/2013  . Breast cancer of upper-inner quadrant of right female breast 02/21/2013  . Tinea capitis 10/31/2012  . Annual physical exam 08/08/2012  . Type II or unspecified type diabetes mellitus without mention of complication, not stated as uncontrolled 08/08/2012  . Anxiety and depression 08/08/2012  . Pure hypercholesterolemia 08/08/2012   Past Medical History  Diagnosis Date  . Anxiety   . Arthritis   . Diabetes mellitus     borderline  . GERD (gastroesophageal reflux disease)   . Hyperlipidemia   . Insomnia, unspecified   . Leukocytopenia, unspecified   . Barrett's esophagus   . Breast fibrocystic disorder   . Genital warts   . Osteoarthrosis, unspecified whether generalized or localized,  ankle and foot     ankle/foot  . Exercise induced bronchospasm   . Personal history of colonic polyps   . Migraine, unspecified, without mention of intractable migraine without mention of status migrainosus   . Symptomatic menopausal or female climacteric states   . Obesity, unspecified   . Essential hypertension, benign   . Depressive disorder, not elsewhere classified   . Anemia, unspecified   . Chicken pox   . Measles   . Mumps   . Breast cancer     right breast invasive ductal carcinoma   . Infected postoperative breast seroma   . Bronchitis   . Constipation, chronic     Past Surgical History  Procedure Laterality Date  . Cholecystectomy    . Bunionectomy      3 surgeries on both feet  . Nasal septum surgery    . Total hip arthroplasty      left      Perthes disease  . Foot surgery      Left-revision  . Dilation and curettage of uterus  1978  . Breast lumpectomy with needle localization and axillary sentinel lymph node bx  06/08/2012    Procedure: BREAST LUMPECTOMY WITH NEEDLE LOCALIZATION AND AXILLARY SENTINEL LYMPH NODE BX;  Surgeon: Rolm Bookbinder, MD;  Location: [place];  Service: General;  Laterality: Right;  . Re-excision of breast cancer,superior margins  06/27/2012    Procedure: RE-EXCISION OF BREAST CANCER,SUPERIOR MARGINS;  Surgeon: Rolm Bookbinder, MD;  Location: [place];  Service: General;  Laterality: Right;  . Eye surgery      Orbital right eye surgery  . Colonoscopy  No prescriptions prior to admission   Allergies  Allergen Reactions  . Penicillins Rash    History  Substance Use Topics  . Smoking status: Never Smoker   . Smokeless tobacco: Never Used  . Alcohol Use: No    Family History  Problem Relation Age of Onset  . Adopted: Yes  . Hypertension Mother   . Diabetes Mother   . Heart failure Mother   . Cancer Maternal Grandmother 4    non hodgkins lymphoma     Review of Systems   Constitutional: Negative.   HENT: Negative.   Eyes: Negative.   Respiratory: Negative.   Cardiovascular: Negative.   Gastrointestinal: Negative.   Genitourinary: Negative.   Musculoskeletal: Positive for joint pain.  Skin: Negative.   Neurological: Negative.   Endo/Heme/Allergies: Negative.   Psychiatric/Behavioral: Negative.     Objective:  Physical Exam  Constitutional: She is oriented to person, place, and time. She appears well-nourished.  HENT:  Head: Normocephalic.  Eyes: Pupils are equal, round, and reactive to light.  Neck: Normal range of motion.  Cardiovascular: Normal rate.   Respiratory: Effort normal.  GI: Soft.  Musculoskeletal:  Left leg valgus deformity.  Range of motion of her knee 0-1 10.  Lateral and medial pain.  Neurological: She is oriented to person, place, and time.  Skin: Skin is warm.  Psychiatric: She has a normal mood and affect.    Vital signs in last 24 hours:    Labs:   Estimated body mass index is 28.43 kg/(m^2) as calculated from the following:   Height as of 10/31/13: 5\' 2"  (1.575 m).   Weight as of 10/31/13: 70.534 kg (155 lb 8 oz).   Imaging Review Plain radiographs demonstrate severe degenerative joint disease of the left knee(s). The overall alignment issignificant valgus. The bone quality appears to be good for age and reported activity level.  Assessment/Plan:  End stage arthritis, left knee   The patient history, physical examination, clinical judgment of the provider and imaging studies are consistent with end stage degenerative joint disease of the left knee(s) and total knee arthroplasty is deemed medically necessary. The treatment options including medical management, injection therapy arthroscopy and arthroplasty were discussed at length. The risks and benefits of total knee arthroplasty were presented and reviewed. The risks due to aseptic loosening, infection, stiffness, patella tracking problems, thromboembolic  complications and other imponderables were discussed. The patient acknowledged the explanation, agreed to proceed with the plan and consent was signed. Patient is being admitted for inpatient treatment for surgery, pain control, PT, OT, prophylactic antibiotics, VTE prophylaxis, progressive ambulation and ADL's and discharge planning. The patient is planning to be discharged to skilled nursing facility

## 2014-02-20 ENCOUNTER — Encounter (HOSPITAL_COMMUNITY)
Admission: RE | Disposition: A | Discharge status: Skilled Nursing Facility | Payer: Self-pay | Source: Ambulatory Visit | Attending: Orthopaedic Surgery

## 2014-02-20 ENCOUNTER — Inpatient Hospital Stay (HOSPITAL_COMMUNITY): Payer: Medicare Other | Admitting: Anesthesiology

## 2014-02-20 ENCOUNTER — Inpatient Hospital Stay (HOSPITAL_COMMUNITY)
Admission: RE | Admit: 2014-02-20 | Discharge: 2014-02-22 | DRG: 470 | Disposition: A | Discharge status: Skilled Nursing Facility | Payer: Medicare Other | Source: Ambulatory Visit | Attending: Orthopaedic Surgery | Admitting: Orthopaedic Surgery

## 2014-02-20 ENCOUNTER — Encounter (HOSPITAL_COMMUNITY): Payer: Medicare Other | Admitting: Anesthesiology

## 2014-02-20 ENCOUNTER — Encounter (HOSPITAL_COMMUNITY): Payer: Self-pay | Admitting: Surgery

## 2014-02-20 DIAGNOSIS — K2270 Barrett's esophagus without dysplasia: Secondary | ICD-10-CM | POA: Diagnosis not present

## 2014-02-20 DIAGNOSIS — N6019 Diffuse cystic mastopathy of unspecified breast: Secondary | ICD-10-CM | POA: Diagnosis not present

## 2014-02-20 DIAGNOSIS — M1712 Unilateral primary osteoarthritis, left knee: Secondary | ICD-10-CM | POA: Diagnosis present

## 2014-02-20 DIAGNOSIS — Z860100 Personal history of colon polyps, unspecified: Secondary | ICD-10-CM

## 2014-02-20 DIAGNOSIS — E119 Type 2 diabetes mellitus without complications: Secondary | ICD-10-CM | POA: Diagnosis present

## 2014-02-20 DIAGNOSIS — I10 Essential (primary) hypertension: Secondary | ICD-10-CM | POA: Diagnosis present

## 2014-02-20 DIAGNOSIS — K219 Gastro-esophageal reflux disease without esophagitis: Secondary | ICD-10-CM | POA: Diagnosis present

## 2014-02-20 DIAGNOSIS — M129 Arthropathy, unspecified: Secondary | ICD-10-CM | POA: Diagnosis not present

## 2014-02-20 DIAGNOSIS — D62 Acute posthemorrhagic anemia: Secondary | ICD-10-CM | POA: Diagnosis not present

## 2014-02-20 DIAGNOSIS — M25569 Pain in unspecified knee: Secondary | ICD-10-CM | POA: Diagnosis not present

## 2014-02-20 DIAGNOSIS — G4700 Insomnia, unspecified: Secondary | ICD-10-CM | POA: Diagnosis present

## 2014-02-20 DIAGNOSIS — Z833 Family history of diabetes mellitus: Secondary | ICD-10-CM

## 2014-02-20 DIAGNOSIS — J45990 Exercise induced bronchospasm: Secondary | ICD-10-CM | POA: Diagnosis not present

## 2014-02-20 DIAGNOSIS — Z96649 Presence of unspecified artificial hip joint: Secondary | ICD-10-CM | POA: Diagnosis not present

## 2014-02-20 DIAGNOSIS — E785 Hyperlipidemia, unspecified: Secondary | ICD-10-CM | POA: Diagnosis present

## 2014-02-20 DIAGNOSIS — Z96659 Presence of unspecified artificial knee joint: Secondary | ICD-10-CM | POA: Diagnosis not present

## 2014-02-20 DIAGNOSIS — Z853 Personal history of malignant neoplasm of breast: Secondary | ICD-10-CM

## 2014-02-20 DIAGNOSIS — Z7982 Long term (current) use of aspirin: Secondary | ICD-10-CM

## 2014-02-20 DIAGNOSIS — A630 Anogenital (venereal) warts: Secondary | ICD-10-CM | POA: Diagnosis not present

## 2014-02-20 DIAGNOSIS — M1990 Unspecified osteoarthritis, unspecified site: Secondary | ICD-10-CM | POA: Diagnosis not present

## 2014-02-20 DIAGNOSIS — F3289 Other specified depressive episodes: Secondary | ICD-10-CM | POA: Diagnosis present

## 2014-02-20 DIAGNOSIS — Z807 Family history of other malignant neoplasms of lymphoid, hematopoietic and related tissues: Secondary | ICD-10-CM | POA: Diagnosis not present

## 2014-02-20 DIAGNOSIS — D72819 Decreased white blood cell count, unspecified: Secondary | ICD-10-CM | POA: Diagnosis not present

## 2014-02-20 DIAGNOSIS — F411 Generalized anxiety disorder: Secondary | ICD-10-CM | POA: Diagnosis present

## 2014-02-20 DIAGNOSIS — F329 Major depressive disorder, single episode, unspecified: Secondary | ICD-10-CM | POA: Diagnosis present

## 2014-02-20 DIAGNOSIS — IMO0002 Reserved for concepts with insufficient information to code with codable children: Secondary | ICD-10-CM | POA: Diagnosis not present

## 2014-02-20 DIAGNOSIS — G43909 Migraine, unspecified, not intractable, without status migrainosus: Secondary | ICD-10-CM | POA: Diagnosis not present

## 2014-02-20 DIAGNOSIS — Z8249 Family history of ischemic heart disease and other diseases of the circulatory system: Secondary | ICD-10-CM

## 2014-02-20 DIAGNOSIS — G8918 Other acute postprocedural pain: Secondary | ICD-10-CM | POA: Diagnosis not present

## 2014-02-20 DIAGNOSIS — K5900 Constipation, unspecified: Secondary | ICD-10-CM | POA: Diagnosis present

## 2014-02-20 DIAGNOSIS — K2100 Gastro-esophageal reflux disease with esophagitis, without bleeding: Secondary | ICD-10-CM | POA: Diagnosis not present

## 2014-02-20 DIAGNOSIS — E7800 Pure hypercholesterolemia, unspecified: Secondary | ICD-10-CM | POA: Diagnosis present

## 2014-02-20 DIAGNOSIS — Z79899 Other long term (current) drug therapy: Secondary | ICD-10-CM | POA: Diagnosis not present

## 2014-02-20 DIAGNOSIS — Z880 Allergy status to penicillin: Secondary | ICD-10-CM

## 2014-02-20 DIAGNOSIS — M1710 Unilateral primary osteoarthritis, unspecified knee: Secondary | ICD-10-CM | POA: Diagnosis not present

## 2014-02-20 DIAGNOSIS — M84469D Pathological fracture, unspecified tibia and fibula, subsequent encounter for fracture with routine healing: Secondary | ICD-10-CM | POA: Diagnosis not present

## 2014-02-20 HISTORY — PX: TOTAL KNEE ARTHROPLASTY: SHX125

## 2014-02-20 LAB — CBC WITH DIFFERENTIAL/PLATELET
Basophils Absolute: 0 10*3/uL (ref 0.0–0.1)
Basophils Relative: 1 % (ref 0–1)
Eosinophils Absolute: 0.2 10*3/uL (ref 0.0–0.7)
Eosinophils Relative: 5 % (ref 0–5)
HCT: 31.6 % — ABNORMAL LOW (ref 36.0–46.0)
HEMOGLOBIN: 10.8 g/dL — AB (ref 12.0–15.0)
LYMPHS ABS: 1.4 10*3/uL (ref 0.7–4.0)
LYMPHS PCT: 45 % (ref 12–46)
MCH: 30.3 pg (ref 26.0–34.0)
MCHC: 34.2 g/dL (ref 30.0–36.0)
MCV: 88.8 fL (ref 78.0–100.0)
Monocytes Absolute: 0.3 10*3/uL (ref 0.1–1.0)
Monocytes Relative: 10 % (ref 3–12)
NEUTROS PCT: 39 % — AB (ref 43–77)
Neutro Abs: 1.2 10*3/uL — ABNORMAL LOW (ref 1.7–7.7)
Platelets: 136 10*3/uL — ABNORMAL LOW (ref 150–400)
RBC: 3.56 MIL/uL — AB (ref 3.87–5.11)
RDW: 13 % (ref 11.5–15.5)
WBC: 3 10*3/uL — ABNORMAL LOW (ref 4.0–10.5)

## 2014-02-20 LAB — GLUCOSE, CAPILLARY
Glucose-Capillary: 101 mg/dL — ABNORMAL HIGH (ref 70–99)
Glucose-Capillary: 118 mg/dL — ABNORMAL HIGH (ref 70–99)
Glucose-Capillary: 141 mg/dL — ABNORMAL HIGH (ref 70–99)
Glucose-Capillary: 140 mg/dL — ABNORMAL HIGH (ref 70–99)

## 2014-02-20 SURGERY — ARTHROPLASTY, KNEE, TOTAL
Anesthesia: General | Laterality: Left

## 2014-02-20 MED ORDER — HYDROCODONE-ACETAMINOPHEN 5-325 MG PO TABS
ORAL_TABLET | ORAL | Status: AC
  Filled 2014-02-20: qty 1

## 2014-02-20 MED ORDER — TRANEXAMIC ACID 100 MG/ML IV SOLN
1000.0000 mg | INTRAVENOUS | Status: AC
  Administered 2014-02-20: 1000 mg via INTRAVENOUS
  Filled 2014-02-20: qty 10

## 2014-02-20 MED ORDER — EPHEDRINE SULFATE 50 MG/ML IJ SOLN
INTRAMUSCULAR | Status: AC
  Filled 2014-02-20: qty 1

## 2014-02-20 MED ORDER — ONDANSETRON HCL 4 MG/2ML IJ SOLN
4.0000 mg | Freq: Four times a day (QID) | INTRAMUSCULAR | Status: DC | PRN
  Administered 2014-02-20: 4 mg via INTRAVENOUS

## 2014-02-20 MED ORDER — DOCUSATE SODIUM 100 MG PO CAPS
100.0000 mg | ORAL_CAPSULE | Freq: Two times a day (BID) | ORAL | Status: DC
  Administered 2014-02-20 – 2014-02-22 (×4): 100 mg via ORAL
  Filled 2014-02-20 (×4): qty 1

## 2014-02-20 MED ORDER — SUMATRIPTAN SUCCINATE 50 MG PO TABS
50.0000 mg | ORAL_TABLET | ORAL | Status: DC | PRN
  Filled 2014-02-20: qty 1

## 2014-02-20 MED ORDER — METHOCARBAMOL 1000 MG/10ML IJ SOLN
500.0000 mg | Freq: Four times a day (QID) | INTRAVENOUS | Status: DC | PRN
  Filled 2014-02-20: qty 5

## 2014-02-20 MED ORDER — ASPIRIN EC 325 MG PO TBEC
325.0000 mg | DELAYED_RELEASE_TABLET | Freq: Two times a day (BID) | ORAL | Status: DC
  Administered 2014-02-21: 325 mg via ORAL
  Filled 2014-02-20 (×5): qty 1

## 2014-02-20 MED ORDER — DIINDOLYLMETHANE POWD: 1.0000 | Freq: Every day | Status: DC

## 2014-02-20 MED ORDER — METOCLOPRAMIDE HCL 5 MG/ML IJ SOLN: 5.0000 mg | Freq: Three times a day (TID) | INTRAMUSCULAR | Status: DC | PRN

## 2014-02-20 MED ORDER — PROPOFOL 10 MG/ML IV BOLUS
INTRAVENOUS | Status: AC
  Filled 2014-02-20: qty 20

## 2014-02-20 MED ORDER — PANTOPRAZOLE SODIUM 40 MG PO TBEC
80.0000 mg | DELAYED_RELEASE_TABLET | Freq: Every day | ORAL | Status: DC
Start: 2014-02-20 — End: 2014-02-22
  Administered 2014-02-20 – 2014-02-22 (×3): 80 mg via ORAL
  Filled 2014-02-20 (×3): qty 2

## 2014-02-20 MED ORDER — SODIUM CHLORIDE 0.9 % IR SOLN
Status: DC | PRN
  Administered 2014-02-20: 3000 mL

## 2014-02-20 MED ORDER — FENTANYL CITRATE 0.05 MG/ML IJ SOLN
INTRAMUSCULAR | Status: AC
  Filled 2014-02-20: qty 5

## 2014-02-20 MED ORDER — HYDROMORPHONE HCL PF 1 MG/ML IJ SOLN
0.5000 mg | INTRAMUSCULAR | Status: DC | PRN
  Administered 2014-02-20 – 2014-02-22 (×4): 1 mg via INTRAVENOUS
  Filled 2014-02-20 (×4): qty 1

## 2014-02-20 MED ORDER — SUCCINYLCHOLINE CHLORIDE 20 MG/ML IJ SOLN
INTRAMUSCULAR | Status: AC
  Filled 2014-02-20: qty 1

## 2014-02-20 MED ORDER — LIDOCAINE HCL (CARDIAC) 20 MG/ML IV SOLN
INTRAVENOUS | Status: AC
  Filled 2014-02-20: qty 5

## 2014-02-20 MED ORDER — LACTATED RINGERS IV SOLN: INTRAVENOUS | Status: DC

## 2014-02-20 MED ORDER — VANCOMYCIN HCL IN DEXTROSE 1-5 GM/200ML-% IV SOLN
1000.0000 mg | Freq: Two times a day (BID) | INTRAVENOUS | Status: AC
  Administered 2014-02-20: 1000 mg via INTRAVENOUS
  Filled 2014-02-20: qty 200

## 2014-02-20 MED ORDER — PHENOL 1.4 % MT LIQD: 1.0000 | OROMUCOSAL | Status: DC | PRN

## 2014-02-20 MED ORDER — ACETAMINOPHEN 325 MG PO TABS: 650.0000 mg | ORAL_TABLET | Freq: Four times a day (QID) | ORAL | Status: DC | PRN

## 2014-02-20 MED ORDER — ONDANSETRON HCL 4 MG PO TABS: 4.0000 mg | ORAL_TABLET | Freq: Four times a day (QID) | ORAL | Status: DC | PRN

## 2014-02-20 MED ORDER — LISINOPRIL 5 MG PO TABS
5.0000 mg | ORAL_TABLET | Freq: Every day | ORAL | Status: DC
  Administered 2014-02-20 – 2014-02-21 (×2): 5 mg via ORAL
  Filled 2014-02-20 (×3): qty 1

## 2014-02-20 MED ORDER — METOCLOPRAMIDE HCL 5 MG PO TABS
5.0000 mg | ORAL_TABLET | Freq: Three times a day (TID) | ORAL | Status: DC | PRN
  Filled 2014-02-20: qty 2

## 2014-02-20 MED ORDER — MENTHOL 3 MG MT LOZG: 1.0000 | LOZENGE | OROMUCOSAL | Status: DC | PRN

## 2014-02-20 MED ORDER — ASPIRIN EC 325 MG PO TBEC
325.0000 mg | DELAYED_RELEASE_TABLET | Freq: Every day | ORAL | Status: DC
  Administered 2014-02-21 – 2014-02-22 (×2): 325 mg via ORAL
  Filled 2014-02-20 (×3): qty 1

## 2014-02-20 MED ORDER — ALBUTEROL SULFATE (2.5 MG/3ML) 0.083% IN NEBU: 2.0000 mL | INHALATION_SOLUTION | Freq: Four times a day (QID) | RESPIRATORY_TRACT | Status: DC | PRN

## 2014-02-20 MED ORDER — PHENYLEPHRINE 40 MCG/ML (10ML) SYRINGE FOR IV PUSH (FOR BLOOD PRESSURE SUPPORT)
PREFILLED_SYRINGE | INTRAVENOUS | Status: AC
  Filled 2014-02-20: qty 10

## 2014-02-20 MED ORDER — ONDANSETRON HCL 4 MG/2ML IJ SOLN
INTRAMUSCULAR | Status: DC | PRN
  Administered 2014-02-20: 4 mg via INTRAVENOUS

## 2014-02-20 MED ORDER — METFORMIN HCL ER 500 MG PO TB24
500.0000 mg | ORAL_TABLET | Freq: Every day | ORAL | Status: DC
  Administered 2014-02-21: 500 mg via ORAL
  Administered 2014-02-22: 1000 mg via ORAL
  Filled 2014-02-20 (×3): qty 2

## 2014-02-20 MED ORDER — GLYCOPYRROLATE 0.2 MG/ML IJ SOLN
INTRAMUSCULAR | Status: DC | PRN
  Administered 2014-02-20: 0.2 mg via INTRAVENOUS

## 2014-02-20 MED ORDER — SODIUM CHLORIDE 0.9 % IJ SOLN
INTRAMUSCULAR | Status: DC | PRN
  Administered 2014-02-20 (×2): 10 mL

## 2014-02-20 MED ORDER — LACTATED RINGERS IV SOLN
INTRAVENOUS | Status: DC
  Administered 2014-02-20: 18:00:00 via INTRAVENOUS

## 2014-02-20 MED ORDER — STERILE WATER FOR INJECTION IJ SOLN
INTRAMUSCULAR | Status: AC
  Filled 2014-02-20: qty 10

## 2014-02-20 MED ORDER — ALPRAZOLAM 0.5 MG PO TABS
0.5000 mg | ORAL_TABLET | Freq: Every day | ORAL | Status: DC
  Administered 2014-02-20 – 2014-02-21 (×2): 0.5 mg via ORAL
  Filled 2014-02-20 (×2): qty 1

## 2014-02-20 MED ORDER — METHOCARBAMOL 500 MG PO TABS
ORAL_TABLET | ORAL | Status: AC
  Filled 2014-02-20: qty 1

## 2014-02-20 MED ORDER — INSULIN ASPART 100 UNIT/ML ~~LOC~~ SOLN: 0.0000 [IU] | Freq: Three times a day (TID) | SUBCUTANEOUS | Status: DC

## 2014-02-20 MED ORDER — COQ10 200 MG PO CAPS: 200.0000 mg | ORAL_CAPSULE | Freq: Every day | ORAL | Status: DC

## 2014-02-20 MED ORDER — BUPIVACAINE LIPOSOME 1.3 % IJ SUSP
20.0000 mL | Freq: Once | INTRAMUSCULAR | Status: DC
  Filled 2014-02-20: qty 20

## 2014-02-20 MED ORDER — DOCUSATE SODIUM 100 MG PO CAPS
100.0000 mg | ORAL_CAPSULE | Freq: Two times a day (BID) | ORAL | Status: DC
  Administered 2014-02-21: 100 mg via ORAL
  Filled 2014-02-20 (×5): qty 1

## 2014-02-20 MED ORDER — BUPIVACAINE LIPOSOME 1.3 % IJ SUSP
INTRAMUSCULAR | Status: DC | PRN
  Administered 2014-02-20: 20 mL

## 2014-02-20 MED ORDER — FENTANYL CITRATE 0.05 MG/ML IJ SOLN
INTRAMUSCULAR | Status: DC | PRN
  Administered 2014-02-20 (×2): 25 ug via INTRAVENOUS
  Administered 2014-02-20: 50 ug via INTRAVENOUS
  Administered 2014-02-20: 25 ug via INTRAVENOUS
  Administered 2014-02-20 (×2): 50 ug via INTRAVENOUS
  Administered 2014-02-20: 25 ug via INTRAVENOUS

## 2014-02-20 MED ORDER — CHLORHEXIDINE GLUCONATE 4 % EX LIQD
60.0000 mL | Freq: Once | CUTANEOUS | Status: DC
  Filled 2014-02-20: qty 60

## 2014-02-20 MED ORDER — LIDOCAINE HCL (CARDIAC) 20 MG/ML IV SOLN
INTRAVENOUS | Status: DC | PRN
  Administered 2014-02-20: 50 mg via INTRAVENOUS

## 2014-02-20 MED ORDER — ONDANSETRON HCL 4 MG/2ML IJ SOLN: 4.0000 mg | Freq: Once | INTRAMUSCULAR | Status: DC | PRN

## 2014-02-20 MED ORDER — ALUM & MAG HYDROXIDE-SIMETH 200-200-20 MG/5ML PO SUSP: 30.0000 mL | ORAL | Status: DC | PRN

## 2014-02-20 MED ORDER — EPHEDRINE SULFATE 50 MG/ML IJ SOLN
INTRAMUSCULAR | Status: DC | PRN
  Administered 2014-02-20: 10 mg via INTRAVENOUS
  Administered 2014-02-20: 5 mg via INTRAVENOUS

## 2014-02-20 MED ORDER — PHENYLEPHRINE HCL 10 MG/ML IJ SOLN
INTRAMUSCULAR | Status: DC | PRN
  Administered 2014-02-20 (×3): 80 ug via INTRAVENOUS

## 2014-02-20 MED ORDER — ROCURONIUM BROMIDE 50 MG/5ML IV SOLN
INTRAVENOUS | Status: AC
  Filled 2014-02-20: qty 1

## 2014-02-20 MED ORDER — PAROXETINE HCL 20 MG PO TABS
20.0000 mg | ORAL_TABLET | Freq: Every day | ORAL | Status: DC
  Administered 2014-02-20 – 2014-02-22 (×3): 20 mg via ORAL
  Filled 2014-02-20 (×3): qty 1

## 2014-02-20 MED ORDER — HYDROCODONE-ACETAMINOPHEN 5-325 MG PO TABS
1.0000 | ORAL_TABLET | ORAL | Status: DC | PRN
  Administered 2014-02-20 (×2): 2 via ORAL
  Administered 2014-02-20 (×2): 1 via ORAL
  Administered 2014-02-21 (×2): 2 via ORAL
  Filled 2014-02-20 (×5): qty 2

## 2014-02-20 MED ORDER — 0.9 % SODIUM CHLORIDE (POUR BTL) OPTIME
TOPICAL | Status: DC | PRN
  Administered 2014-02-20: 1000 mL

## 2014-02-20 MED ORDER — MIDAZOLAM HCL 2 MG/2ML IJ SOLN
INTRAMUSCULAR | Status: AC
  Filled 2014-02-20: qty 2

## 2014-02-20 MED ORDER — ONDANSETRON HCL 4 MG/2ML IJ SOLN
INTRAMUSCULAR | Status: AC
  Filled 2014-02-20: qty 2

## 2014-02-20 MED ORDER — ACETAMINOPHEN 650 MG RE SUPP
650.0000 mg | Freq: Four times a day (QID) | RECTAL | Status: DC | PRN
Start: 2014-02-20 — End: 2014-02-22

## 2014-02-20 MED ORDER — SIMETHICONE 80 MG PO CHEW
80.0000 mg | CHEWABLE_TABLET | Freq: Four times a day (QID) | ORAL | Status: DC | PRN
  Filled 2014-02-20: qty 1

## 2014-02-20 MED ORDER — HYDROMORPHONE HCL PF 1 MG/ML IJ SOLN
0.2500 mg | INTRAMUSCULAR | Status: DC | PRN
  Administered 2014-02-20 (×4): 0.5 mg via INTRAVENOUS

## 2014-02-20 MED ORDER — LACTATED RINGERS IV SOLN
INTRAVENOUS | Status: DC | PRN
  Administered 2014-02-20 (×2): via INTRAVENOUS

## 2014-02-20 MED ORDER — BISACODYL 5 MG PO TBEC
5.0000 mg | DELAYED_RELEASE_TABLET | Freq: Every day | ORAL | Status: DC | PRN
  Administered 2014-02-21: 5 mg via ORAL
  Filled 2014-02-20: qty 1

## 2014-02-20 MED ORDER — METHOCARBAMOL 500 MG PO TABS
500.0000 mg | ORAL_TABLET | Freq: Four times a day (QID) | ORAL | Status: DC | PRN
  Administered 2014-02-20 – 2014-02-22 (×6): 500 mg via ORAL
  Filled 2014-02-20 (×6): qty 1

## 2014-02-20 MED ORDER — HYDROMORPHONE HCL PF 1 MG/ML IJ SOLN
INTRAMUSCULAR | Status: AC
  Filled 2014-02-20: qty 1

## 2014-02-20 MED ORDER — DIPHENHYDRAMINE HCL 12.5 MG/5ML PO ELIX: 12.5000 mg | ORAL_SOLUTION | ORAL | Status: DC | PRN

## 2014-02-20 MED ORDER — PROPOFOL 10 MG/ML IV BOLUS
INTRAVENOUS | Status: DC | PRN
  Administered 2014-02-20: 200 mg via INTRAVENOUS

## 2014-02-20 MED ORDER — ATORVASTATIN CALCIUM 20 MG PO TABS
20.0000 mg | ORAL_TABLET | Freq: Every day | ORAL | Status: DC
  Administered 2014-02-20 – 2014-02-22 (×3): 20 mg via ORAL
  Filled 2014-02-20 (×3): qty 1

## 2014-02-20 SURGICAL SUPPLY — 70 items
APL SKNCLS STERI-STRIP NONHPOA (GAUZE/BANDAGES/DRESSINGS) ×1
BANDAGE ELASTIC 4 VELCRO ST LF (GAUZE/BANDAGES/DRESSINGS) ×1 IMPLANT
BANDAGE ESMARK 6X9 LF (GAUZE/BANDAGES/DRESSINGS) ×1 IMPLANT
BENZOIN TINCTURE PRP APPL 2/3 (GAUZE/BANDAGES/DRESSINGS) ×2 IMPLANT
BLADE SAG 18X100X1.27 (BLADE) IMPLANT
BLADE SAGITTAL 13X1.27X60 (BLADE) IMPLANT
BLADE SAGITTAL 13X1.27X60MM (BLADE)
BLADE SAGITTAL 25.0X1.19X90 (BLADE) ×1 IMPLANT
BLADE SAGITTAL 25.0X1.19X90MM (BLADE) ×1
BLADE SURG ROTATE 9660 (MISCELLANEOUS) IMPLANT
BNDG CMPR 9X6 STRL LF SNTH (GAUZE/BANDAGES/DRESSINGS) ×1
BNDG CMPR MED 10X6 ELC LF (GAUZE/BANDAGES/DRESSINGS) ×1
BNDG ELASTIC 6X10 VLCR STRL LF (GAUZE/BANDAGES/DRESSINGS) ×3 IMPLANT
BNDG ESMARK 6X9 LF (GAUZE/BANDAGES/DRESSINGS) ×3
BNDG GAUZE ELAST 4 BULKY (GAUZE/BANDAGES/DRESSINGS) ×4 IMPLANT
BOWL SMART MIX CTS (DISPOSABLE) ×3 IMPLANT
CAP UPCHARGE REVISION TRAY ×2 IMPLANT
CAPT RP KNEE ×2 IMPLANT
CEMENT HV SMART SET (Cement) ×6 IMPLANT
COVER SURGICAL LIGHT HANDLE (MISCELLANEOUS) ×3 IMPLANT
CUFF TOURNIQUET SINGLE 34IN LL (TOURNIQUET CUFF) ×3 IMPLANT
CUFF TOURNIQUET SINGLE 44IN (TOURNIQUET CUFF) IMPLANT
DRAPE EXTREMITY T 121X128X90 (DRAPE) ×3 IMPLANT
DRAPE PROXIMA HALF (DRAPES) ×3 IMPLANT
DRAPE U-SHAPE 47X51 STRL (DRAPES) ×3 IMPLANT
DRSG ADAPTIC 3X8 NADH LF (GAUZE/BANDAGES/DRESSINGS) ×1 IMPLANT
DRSG PAD ABDOMINAL 8X10 ST (GAUZE/BANDAGES/DRESSINGS) ×3 IMPLANT
DURAPREP 26ML APPLICATOR (WOUND CARE) ×3 IMPLANT
ELECT REM PT RETURN 9FT ADLT (ELECTROSURGICAL) ×3
ELECTRODE REM PT RTRN 9FT ADLT (ELECTROSURGICAL) ×1 IMPLANT
GAUZE SPONGE 4X4 12PLY STRL (GAUZE/BANDAGES/DRESSINGS) ×1 IMPLANT
GLOVE BIO SURGEON STRL SZ8 (GLOVE) ×6 IMPLANT
GLOVE BIOGEL PI IND STRL 8 (GLOVE) ×2 IMPLANT
GLOVE BIOGEL PI INDICATOR 8 (GLOVE) ×4
GOWN STRL REUS W/ TWL LRG LVL3 (GOWN DISPOSABLE) ×1 IMPLANT
GOWN STRL REUS W/ TWL XL LVL3 (GOWN DISPOSABLE) ×2 IMPLANT
GOWN STRL REUS W/TWL 2XL LVL3 (GOWN DISPOSABLE) ×3 IMPLANT
GOWN STRL REUS W/TWL LRG LVL3 (GOWN DISPOSABLE) ×3
GOWN STRL REUS W/TWL XL LVL3 (GOWN DISPOSABLE) ×6
HANDPIECE INTERPULSE COAX TIP (DISPOSABLE) ×3
HOOD PEEL AWAY FACE SHEILD DIS (HOOD) ×6 IMPLANT
IMMOBILIZER KNEE 20 (SOFTGOODS) IMPLANT
IMMOBILIZER KNEE 22 (SOFTGOODS) ×2 IMPLANT
IMMOBILIZER KNEE 22 UNIV (SOFTGOODS) ×1 IMPLANT
IMMOBILIZER KNEE 24 THIGH 36 (MISCELLANEOUS) IMPLANT
IMMOBILIZER KNEE 24 UNIV (MISCELLANEOUS)
KIT BASIN OR (CUSTOM PROCEDURE TRAY) ×3 IMPLANT
KIT ROOM TURNOVER OR (KITS) ×3 IMPLANT
MANIFOLD NEPTUNE II (INSTRUMENTS) ×3 IMPLANT
NDL HYPO 21X1 ECLIPSE (NEEDLE) ×1 IMPLANT
NEEDLE HYPO 21X1 ECLIPSE (NEEDLE) ×3 IMPLANT
NS IRRIG 1000ML POUR BTL (IV SOLUTION) ×3 IMPLANT
PACK TOTAL JOINT (CUSTOM PROCEDURE TRAY) ×3 IMPLANT
PAD ARMBOARD 7.5X6 YLW CONV (MISCELLANEOUS) ×6 IMPLANT
SET HNDPC FAN SPRY TIP SCT (DISPOSABLE) ×1 IMPLANT
SPONGE GAUZE 4X4 12PLY STER LF (GAUZE/BANDAGES/DRESSINGS) ×2 IMPLANT
STAPLER VISISTAT 35W (STAPLE) IMPLANT
SUCTION FRAZIER TIP 10 FR DISP (SUCTIONS) IMPLANT
SUT MNCRL AB 3-0 PS2 18 (SUTURE) ×2 IMPLANT
SUT VIC AB 0 CT1 27 (SUTURE) ×6
SUT VIC AB 0 CT1 27XBRD ANBCTR (SUTURE) ×2 IMPLANT
SUT VIC AB 2-0 CT1 27 (SUTURE) ×6
SUT VIC AB 2-0 CT1 TAPERPNT 27 (SUTURE) ×2 IMPLANT
SUT VLOC 180 0 24IN GS25 (SUTURE) ×3 IMPLANT
SYR 50ML LL SCALE MARK (SYRINGE) ×3 IMPLANT
TOWEL OR 17X24 6PK STRL BLUE (TOWEL DISPOSABLE) ×3 IMPLANT
TOWEL OR 17X26 10 PK STRL BLUE (TOWEL DISPOSABLE) ×3 IMPLANT
TRAY FOLEY CATH 14FR (SET/KITS/TRAYS/PACK) IMPLANT
TRAY REVISION SZ 4 (Knees) IMPLANT
WATER STERILE IRR 1000ML POUR (IV SOLUTION) ×2 IMPLANT

## 2014-02-20 NOTE — Evaluation (Signed)
Physical Therapy Evaluation Patient Details Name: Alisha Matthews MRN: [id number] DOB: 1951/02/15 Today's Date: 02/20/2014   History of Present Illness  63 y.o. female admitted to [facility] on 02/20/14 for elective L TKA.  Pt with significant PMHx of L THA (13 years ago), OA, exercise induced bronchospasm, migraine, HTN, anemia, breast CA (s/p lumpectomy, and left foot surgery.   Clinical Impression  Pt is POD #0 and is moving well with min guard assist and the RW.  She plans for SNF as her husband is going to continue working and her 5 cats present as a significant fall risk for her at home at her current mobility level.   PT to follow acutely for deficits listed below.       Follow Up Recommendations SNF    Equipment Recommendations  None recommended by PT    Recommendations for Other Services   NA    Precautions / Restrictions Precautions Precautions: Knee Precaution Comments: reviewed WBAT status and KI use Required Braces or Orthoses: Knee Immobilizer - Left Knee Immobilizer - Left: On at all times Restrictions Weight Bearing Restrictions: Yes LLE Weight Bearing: Weight bearing as tolerated      Mobility  Bed Mobility Overal bed mobility: Modified Independent             General bed mobility comments: pt using her arms to move her left leg and bed rail for leverage for her trunk.   Transfers Overall transfer level: Needs assistance Equipment used: Rolling walker (2 wheeled) Transfers: Sit to/from Stand Sit to Stand: Min guard         General transfer comment: Min guard assist for safety. Verbal cues for safe hand placement.   Ambulation/Gait Ambulation/Gait assistance: Min guard Ambulation Distance (Feet): 15 Feet (x2) Assistive device: Rolling walker (2 wheeled) Gait Pattern/deviations: Step-to pattern;Antalgic     General Gait Details: mild reports of lightheadedness during gait requiring one seated rest break.  Verbal cues for correct leg sequencing and safe RW  use.  Min guard assist for safety during gait.          Balance Overall balance assessment: Needs assistance Sitting-balance support: Feet supported;No upper extremity supported Sitting balance-Leahy Scale: Good     Standing balance support: Bilateral upper extremity supported Standing balance-Leahy Scale: Fair Standing balance comment: pt able to stand without upper extremity support, but only statically.                              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  Husband is going to continue to work while she is doing rehab at [facility].     Prior Function Level of Independence: Independent                  Extremity/Trunk Assessment   Upper Extremity Assessment: Overall WFL for tasks assessed           Lower Extremity Assessment: LLE deficits/detail   LLE Deficits / Details: left leg with normal post-op pain and weakness, ankle 4/5, knee 3-/5,  hip 3-/5  Cervical / Trunk Assessment: Normal  Communication   Communication: No difficulties  Cognition Arousal/Alertness: Awake/alert Behavior During Therapy: Anxious Overall Cognitive Status: Within Functional Limits for tasks assessed                         Exercises Total Joint Exercises Ankle Circles/Pumps: AROM;Both;10 reps;Supine      Assessment/Plan    PT Assessment Patient needs continued PT services  PT Diagnosis Difficulty walking;Abnormality of gait;Generalized weakness;Acute pain   PT Problem List Decreased strength;Decreased range of motion;Decreased activity tolerance;Decreased  balance;Decreased mobility;Decreased knowledge of use of DME;Decreased knowledge of precautions;Pain  PT Treatment Interventions DME instruction;Gait training;Stair training;Functional mobility training;Therapeutic activities;Therapeutic exercise;Balance training;Neuromuscular re-education;Patient/family education;Modalities   PT Goals (Current goals can be found in the Care Plan section) Acute Rehab PT Goals Patient Stated Goal: to go to SNF level rehab and be on a cane when she comes home like she did with her hip surgery.  PT Goal Formulation: With patient Time For Goal Achievement: 02/27/14 Potential to Achieve Goals: Good    Frequency 7X/week   Barriers to discharge Decreased caregiver support husband is going to continue to work, she has 5 cats at home- making a fall risk       End of Session Equipment Utilized During Treatment: Gait belt;Left knee immobilizer Activity Tolerance: Patient tolerated treatment well Patient left: in chair;with call bell/phone within reach;with family/visitor present           Time: 5852-7782 PT Time Calculation (min): 47 min   Charges:   PT Evaluation $Initial PT Evaluation Tier I: 1 Procedure PT Treatments $Gait Training: 8-22 mins $Therapeutic Activity: 8-22 mins        Donyell Carrell B. [place], [place], DPT [phone]   02/20/2014, 5:02 PM

## 2014-02-20 NOTE — Anesthesia Preprocedure Evaluation (Signed)
Anesthesia Evaluation  Patient identified by MRN, date of birth, ID band Patient awake    Reviewed: Allergy & Precautions, H&P , NPO status , Patient's Chart, lab work & pertinent test results  Airway       Dental   Pulmonary          Cardiovascular hypertension,     Neuro/Psych  Headaches,    GI/Hepatic GERD-  ,  Endo/Other  diabetes, Type 2, Oral Hypoglycemic Agents  Renal/GU      Musculoskeletal   Abdominal   Peds  Hematology   Anesthesia Other Findings   Reproductive/Obstetrics                           Anesthesia Physical Anesthesia Plan  ASA: II  Anesthesia Plan: General   Post-op Pain Management:    Induction: Intravenous  Airway Management Planned: LMA and Oral ETT  Additional Equipment:   Intra-op Plan:   Post-operative Plan: Extubation in OR  Informed Consent: I have reviewed the patients History and Physical, chart, labs and discussed the procedure including the risks, benefits and alternatives for the proposed anesthesia with the patient or authorized representative who has indicated his/her understanding and acceptance.     Plan Discussed with: CRNA and Anesthesiologist  Anesthesia Plan Comments:         Anesthesia Quick Evaluation

## 2014-02-20 NOTE — Interval H&P Note (Signed)
History and Physical Interval Note:  02/20/2014 7:24 AM  Alisha Matthews  has presented today for surgery, with the diagnosis of LEFT KNEE DEGENERATIVE JOINT DISEASE  The various methods of treatment have been discussed with the patient and family. After consideration of risks, benefits and other options for treatment, the patient has consented to  Procedure(s): TOTAL KNEE ARTHROPLASTY (Left) as a surgical intervention .  The patient's history has been reviewed, patient examined, no change in status, stable for surgery.  I have reviewed the patient's chart and labs.  Questions were answered to the patient's satisfaction.     [name] G

## 2014-02-20 NOTE — Progress Notes (Signed)
Utilization review completed.  

## 2014-02-20 NOTE — Anesthesia Procedure Notes (Addendum)
Procedure Name: LMA Insertion Date/Time: 02/20/2014 7:40 AM Performed by: Susa Loffler Pre-anesthesia Checklist: Patient identified, Timeout performed, Emergency Drugs available, Suction available and Patient being monitored Patient Re-evaluated:Patient Re-evaluated prior to inductionOxygen Delivery Method: Circle system utilized Preoxygenation: Pre-oxygenation with 100% oxygen Intubation Type: IV induction LMA: LMA inserted LMA Size: 4.0 Number of attempts: 1 Placement Confirmation: positive ETCO2 and breath sounds checked- equal and bilateral Tube secured with: Tape Dental Injury: Teeth and Oropharynx as per pre-operative assessment    Anesthesia Regional Block:  Femoral nerve block  Pre-Anesthetic Checklist: ,, timeout performed, Correct Patient, Correct Site, Correct Laterality, Correct Procedure, Correct Position, site marked, Risks and benefits discussed,  Surgical consent,  Pre-op evaluation,  At surgeon's request and post-op pain management  Laterality: Left  Prep: Maximum Sterile Barrier Precautions used, chloraprep and alcohol swabs       Needles:  Injection technique: Single-shot  Needle Type: Stimulator Needle - 80        Needle insertion depth: 5 cm   Additional Needles:  Procedures: nerve stimulator Femoral nerve block  Nerve Stimulator or Paresthesia:  Response: 0.5 mA, 0.1 ms, 5 cm  Additional Responses:   Narrative:  Start time: 02/20/2014 6:55 AM End time: 02/20/2014 7:00 AM Injection made incrementally with aspirations every 5 mL.  Performed by: Personally  Anesthesiologist: Sharolyn Douglas MD  Additional Notes: Pt accepts procedure w/ risks. 20cc 0.5% Marcaine w/ epi w/o difficulty or discomfort. GES

## 2014-02-20 NOTE — Progress Notes (Signed)
Orthopedic Tech Progress Note Patient Details:  Alisha Matthews 01-25-1951 [id number]  CPM Left Knee CPM Left Knee: On Left Knee Flexion (Degrees): 60 Left Knee Extension (Degrees): 0 Additional Comments: Trapeze bar   [name] 02/20/2014, 12:24 PM

## 2014-02-20 NOTE — Transfer of Care (Signed)
Immediate Anesthesia Transfer of Care Note  Patient: Alisha Matthews  Procedure(s) Performed: Procedure(s): TOTAL KNEE ARTHROPLASTY (Left)  Patient Location: PACU  Anesthesia Type:General  Level of Consciousness: awake, alert  and oriented  Airway & Oxygen Therapy: Patient Spontanous Breathing and Patient connected to nasal cannula oxygen  Post-op Assessment: Report given to PACU RN and Post -op Vital signs reviewed and stable  Post vital signs: Reviewed and stable  Complications: No apparent anesthesia complications

## 2014-02-20 NOTE — Op Note (Signed)
PREOP DIAGNOSIS: DJD LEFT KNEE POSTOP DIAGNOSIS:  same PROCEDURE: LEFT TKR ANESTHESIA: General and block ATTENDING SURGEON: Maxyne Derocher G ASSISTANT: Loni Dolly PA  INDICATIONS FOR PROCEDURE: Alisha Matthews is a 63 y.o. female who has struggled for a long time with pain due to degenerative arthritis of the left knee.  The patient has failed many conservative non-operative measures and at this point has pain which limits the ability to sleep and walk.  The patient is offered total knee replacement.  Informed operative consent was obtained after discussion of possible risks of anesthesia, infection, neurovascular injury, DVT, and death.  The importance of the post-operative rehabilitation protocol to optimize result was stressed extensively with the patient.  SUMMARY OF FINDINGS AND PROCEDURE:  Alisha Matthews was taken to the operative suite where under the above anesthesia a left knee replacement was performed.  There were advanced degenerative changes and the bone quality was good.  We used the DePuy LCS system and placed size medium femur, 3 MBT revision tibia, 35 mm all polyethylene patella, and a size 15 mm spacer. We did our best to correct her significant valgus deformity.   Loni Dolly PA-C assisted throughout and was invaluable to the completion of the case in that he helped retract and maintain exposure while I placed the components.  He also helped close thereby minimizing OR time.  The patient was admitted for appropriate post-op care to include perioperative antibiotics and mechanical and pharmacologic measures for DVT prophylaxis.  DESCRIPTION OF PROCEDURE:  Alisha Matthews was taken to the operative suite where the above anesthesia was applied.  The patient was positioned supine and prepped and draped in normal sterile fashion.  An appropriate time out was performed.  After the administration of vancomycin  pre-op antibiotic the leg was elevated and exsanguinated and a tourniquet inflated.  A  standard longitudinal incision was made on the anterior knee.  Dissection was carried down to the extensor mechanism.  All appropriate anti-infective measures were used including the pre-operative antibiotic, betadine impregnated drape, and closed hooded exhaust systems for each member of the surgical team.  A medial parapatellar incision was made in the extensor mechanism and the knee cap flipped and the knee flexed.  Some residual meniscal tissues were removed along with any remaining ACL/PCL tissue.  A guide was placed on the tibia and a flat cut was made on it's superior surface.  An intramedullary guide was placed in the femur and was utilized to make anterior and posterior cuts creating an appropriate flexion gap.  A second intramedullary guide was placed in the femur to make a distal cut properly balancing the knee with an extension gap equal to the flexion gap.  The three bones sized to the above mentioned sizes and the appropriate guides were placed and utilized.  A trial reduction was done and the knee easily came to full extension and the patella tracked well on flexion.  The trial components were removed and all bones were cleaned with pulsatile lavage and then dried thoroughly.  Cement was mixed and was pressurized onto the bones followed by placement of the aforementioned components.  Excess cement was trimmed and pressure was held on the components until the cement had hardened.  The tourniquet was deflated and a small amount of bleeding was controlled with cautery and pressure.  The knee was irrigated thoroughly.  The extensor mechanism was re-approximated with V-loc suture in running fashion.  The knee was flexed and the repair was solid.  The subcutaneous tissues were re-approximated with #0 and #2-0 vicryl and the skin closed with a subcuticular stitch and steristrips.  A sterile dressing was applied.  Intraoperative fluids, EBL, and tourniquet time can be obtained from anesthesia  records.  DISPOSITION:  The patient was taken to recovery room in stable condition and admitted for appropriate post-op care to include peri-operative antibiotic and DVT prophylaxis with mechanical and pharmacologic measures.  Dondre Catalfamo G 02/20/2014, 9:15 AM

## 2014-02-20 NOTE — Progress Notes (Signed)
OT NOTE  Pt is Medicare and current D/C plan is SNF. No apparent immediate acute care OT needs, therefore will defer OT to SNF. If OT eval is needed please call Acute Rehab Dept. at [phone] or text page OT at [phone].    Jeri Modena   OTR/L Pager: [phone] Office: [phone] .

## 2014-02-20 NOTE — Anesthesia Postprocedure Evaluation (Signed)
  Anesthesia Post-op Note  Patient: Alisha Matthews  Procedure(s) Performed: Procedure(s): TOTAL KNEE ARTHROPLASTY (Left)  Patient Location: PACU  Anesthesia Type:GA combined with regional for post-op pain  Level of Consciousness: awake, alert , oriented and patient cooperative  Airway and Oxygen Therapy: Patient Spontanous Breathing  Post-op Pain: none  Post-op Assessment: Post-op Vital signs reviewed, Patient's Cardiovascular Status Stable, Respiratory Function Stable, Patent Airway and No signs of Nausea or vomiting  Post-op Vital Signs: stable  Last Vitals:  Filed Vitals:   02/20/14 1105  BP:   Pulse:   Temp: 36.2 C  Resp:     Complications: No apparent anesthesia complications

## 2014-02-21 LAB — BASIC METABOLIC PANEL
ANION GAP: 11 (ref 5–15)
BUN: 8 mg/dL (ref 6–23)
CALCIUM: 8.5 mg/dL (ref 8.4–10.5)
CHLORIDE: 99 meq/L (ref 96–112)
CO2: 26 mEq/L (ref 19–32)
CREATININE: 0.66 mg/dL (ref 0.50–1.10)
GFR calc Af Amer: >90 mL/min (ref >90)
GFR calc non Af Amer: >90 mL/min (ref >90)
GLUCOSE: 139 mg/dL — AB (ref 70–99)
Potassium: 4.1 mEq/L (ref 3.7–5.3)
Sodium: 136 mEq/L — ABNORMAL LOW (ref 137–147)

## 2014-02-21 LAB — CBC
HEMATOCRIT: 28.8 % — AB (ref 36.0–46.0)
HEMOGLOBIN: 9.7 g/dL — AB (ref 12.0–15.0)
MCH: 29.8 pg (ref 26.0–34.0)
MCHC: 33.7 g/dL (ref 30.0–36.0)
MCV: 88.6 fL (ref 78.0–100.0)
PLATELETS: 134 10*3/uL — AB (ref 150–400)
RBC: 3.25 MIL/uL — AB (ref 3.87–5.11)
RDW: 13.1 % (ref 11.5–15.5)
WBC: 4.2 10*3/uL (ref 4.0–10.5)

## 2014-02-21 LAB — GLUCOSE, CAPILLARY
Glucose-Capillary: 126 mg/dL — ABNORMAL HIGH (ref 70–99)
Glucose-Capillary: 128 mg/dL — ABNORMAL HIGH (ref 70–99)
Glucose-Capillary: 122 mg/dL — ABNORMAL HIGH (ref 70–99)

## 2014-02-21 MED ORDER — HYDROCODONE-ACETAMINOPHEN 7.5-325 MG PO TABS
1.0000 | ORAL_TABLET | ORAL | Status: DC | PRN
  Administered 2014-02-21 (×2): 2 via ORAL
  Filled 2014-02-21 (×4): qty 2

## 2014-02-21 NOTE — Progress Notes (Signed)
Physical Therapy Treatment Patient Details Name: Alisha Matthews MRN: [id number] DOB: 12/15/1950 Today's Date: 02/21/2014    History of Present Illness 63 y.o. female admitted to [facility] on 02/20/14 for elective L TKA.  Pt with significant PMHx of L THA (13 years ago), OA, exercise induced bronchospasm, migraine, HTN, anemia, breast CA (s/p lumpectomy, and left foot surgery.     PT Comments    Patient very anxious about her progression with PT. Upset that knee is hurting as bad as it is and is limiting her ambulation today. Patient is planning to DC to [facility] for continued rehab prior to returning home.   Follow Up Recommendations  SNF     Equipment Recommendations  None recommended by PT    Recommendations for Other Services       Precautions / Restrictions Precautions Precautions: Knee Required Braces or Orthoses: Knee Immobilizer - Left Knee Immobilizer - Left: On at all times Restrictions Weight Bearing Restrictions: Yes LLE Weight Bearing: Weight bearing as tolerated    Mobility  Bed Mobility Overal bed mobility: Needs Assistance Bed Mobility: Sit to Supine       Sit to supine: Min assist   General bed mobility comments: A for LE back into bed and cues for technique and positioning   Transfers Overall transfer level: Needs assistance Equipment used: Rolling walker (2 wheeled) Transfers: Sit to/from Stand Sit to Stand: Min guard         General transfer comment: Min guard assist for safety. Verbal cues for safe hand placement.   Ambulation/Gait Ambulation/Gait assistance: Min guard Ambulation Distance (Feet): 30 Feet Assistive device: Rolling walker (2 wheeled) Gait Pattern/deviations: Step-to pattern   Gait velocity interpretation: Below normal speed for age/gender General Gait Details: Cues for gait sequence and RW management. Cues to heel strike. Patient with increased pain and limited ambulation.    Stairs            Wheelchair Mobility     Modified Rankin (Stroke Patients Only)       Balance                                    Cognition Arousal/Alertness: Awake/alert Behavior During Therapy: Anxious Overall Cognitive Status: Within Functional Limits for tasks assessed                      Exercises Total Joint Exercises Quad Sets: AROM;Left;10 reps Heel Slides: AAROM;Left;10 reps Hip ABduction/ADduction: AAROM;Left;10 reps Straight Leg Raises: AAROM;Left;10 reps    General Comments        Pertinent Vitals/Pain Pain Score: 6  Pain Intervention(s): Monitored during session;Repositioned    Home Living                      Prior Function            PT Goals (current goals can now be found in the care plan section) Progress towards PT goals: Progressing toward goals    Frequency  7X/week    PT Plan Current plan remains appropriate    Co-evaluation             End of Session Equipment Utilized During Treatment: Gait belt;Left knee immobilizer Activity Tolerance: Patient tolerated treatment well Patient left: with call bell/phone within reach;with family/visitor present;in bed     Time: 3086-5784 PT Time Calculation (min): 23 min  Charges:  $Gait Training:  8-22 mins $Therapeutic Exercise: 8-22 mins                    G Codes:      Jacqualyn Posey 02/21/2014, 12:29 PM 02/21/2014 Jacqualyn Posey PTA [phone] pager [phone] office

## 2014-02-21 NOTE — Clinical Social Work Placement (Signed)
Clinical Social Work Department CLINICAL SOCIAL WORK PLACEMENT NOTE 02/21/2014  Patient:  Alisha Matthews, Alisha Matthews  Account Number:  [id number] Admit date:  02/20/2014  Clinical Social Worker:  Charlene Brooke, LCSW  Date/time:  02/21/2014 12:51 PM  Clinical Social Work is seeking post-discharge placement for this patient at the following level of care:   SKILLED NURSING   (*CSW will update this form in Epic as items are completed)   02/21/2014  Patient/family provided with [place] Department of Clinical Social Work's list of facilities offering this level of care within the geographic area requested by the patient (or if unable, by the patient's family).  02/21/2014  Patient/family informed of their freedom to choose among providers that offer the needed level of care, that participate in Medicare, Medicaid or managed care program needed by the patient, have an available bed and are willing to accept the patient.  02/21/2014  Patient/family informed of [facility]' ownership interest in [facility], as well as of the fact that they are under no obligation to receive care at this facility.  PASARR submitted to EDS on 02/21/2014 PASARR number received on   FL2 transmitted to all facilities in geographic area requested by pt/family on  02/21/2014 FL2 transmitted to all facilities within larger geographic area on 02/21/2014  Patient informed that his/her managed care company has contracts with or will negotiate with  certain facilities, including the following:     Patient/family informed of bed offers received:  02/21/2014 Patient chooses bed at  Physician recommends and patient chooses bed at  [facility]  Patient to be transferred to  on   Patient to be transferred to facility by  Patient and family notified of transfer on  Name of family member notified:    The following physician request were entered in Epic:   Additional Comments:     Charlene Brooke, MSW Clinical  Social Worker [phone]

## 2014-02-21 NOTE — Progress Notes (Signed)
Subjective: 1 Day Post-Op Procedure(s) (LRB): TOTAL KNEE ARTHROPLASTY (Left)  Activity level:  wbat Diet tolerance:  ok Voiding:  ok Patient reports pain as moderate.    Objective: Vital signs in last 24 hours: Temp:  [97.2 F (36.2 C)-99.3 F (37.4 C)] 98.7 F (37.1 C) (09/02 0544) Pulse Rate:  [50-86] 84 (09/02 0544) Resp:  [11-23] 16 (09/02 0544) BP: (102-149)/(51-97) 102/62 mmHg (09/02 0544) SpO2:  [96 %-100 %] 99 % (09/02 0544) FiO2 (%):  [28 %] 28 % (09/01 1253)  Labs:  Recent Labs  02/20/14 0639 02/21/14 0540  HGB 10.8* 9.7*    Recent Labs  02/20/14 0639 02/21/14 0540  WBC 3.0* 4.2  RBC 3.56* 3.25*  HCT 31.6* 28.8*  PLT 136* 134*    Recent Labs  02/21/14 0540  NA 136*  K 4.1  CL 99  CO2 26  BUN 8  CREATININE 0.66  GLUCOSE 139*  CALCIUM 8.5   No results found for this basename: LABPT, INR,  in the last 72 hours  Physical Exam:  Neurologically intact ABD soft Neurovascular intact Sensation intact distally Intact pulses distally Dorsiflexion/Plantar flexion intact Incision: dressing C/D/I No cellulitis present Compartment soft  Assessment/Plan:  1 Day Post-Op Procedure(s) (LRB): TOTAL KNEE ARTHROPLASTY (Left) Advance diet Up with therapy Plan for discharge tomorrow Discharge to SNF - [facility] Increase norco from 5 to 7.5mg  Dressing change to mepilex. Continue on ASA 325mg  BID x 2 weeks Follow up in office 2 weeks post op.     Ernestine Rohman, Larwance Sachs 02/21/2014, 7:59 AM

## 2014-02-21 NOTE — Clinical Social Work Note (Signed)
Clinical Social Work Department BRIEF PSYCHOSOCIAL ASSESSMENT 02/21/2014  Patient:  Alisha Matthews, Alisha Matthews     Account Number:  [id number]     Admit date:  02/20/2014  Clinical Social Worker:  Jennette Dubin  Date/Time:  02/21/2014 11:43 AM  Referred by:  Physician  Date Referred:  02/21/2014 Referred for  SNF Placement   Other Referral:   N/A   Interview type:  Patient Other interview type:    PSYCHOSOCIAL DATA Living Status:  HUSBAND Admitted from facility:   Level of care:   Primary support name:  Alisha Matthews Primary support relationship to patient:  SPOUSE Degree of support available:   Good Support    CURRENT CONCERNS  Other Concerns:    SOCIAL WORK ASSESSMENT / PLAN Pt admitted to hospital on 02/20/14 for left total knee replacement. Pt lives her husband. It is the plan for pt to go to [facility] for rehab. Pt reports that she completed paperwork quite sometime ago for admission. Pt.'s husband, Alisha Brow, at bedside.   Assessment/plan status:  Psychosocial Support/Ongoing Assessment of Needs Other assessment/ plan:   N/A   Information/referral to community resources:   N/A    PATIENT'S/FAMILY'S RESPONSE TO PLAN OF CARE: SW role explained, pt/husband voiced understanding. SW will continue to follow.    Charlene Brooke, MSW Clinical Social Worker [phone]

## 2014-02-22 ENCOUNTER — Encounter (HOSPITAL_COMMUNITY): Payer: Self-pay | Admitting: Orthopaedic Surgery

## 2014-02-22 DIAGNOSIS — G4700 Insomnia, unspecified: Secondary | ICD-10-CM | POA: Diagnosis not present

## 2014-02-22 DIAGNOSIS — D62 Acute posthemorrhagic anemia: Secondary | ICD-10-CM | POA: Diagnosis not present

## 2014-02-22 DIAGNOSIS — M84469D Pathological fracture, unspecified tibia and fibula, subsequent encounter for fracture with routine healing: Secondary | ICD-10-CM | POA: Diagnosis not present

## 2014-02-22 DIAGNOSIS — J45990 Exercise induced bronchospasm: Secondary | ICD-10-CM | POA: Diagnosis not present

## 2014-02-22 DIAGNOSIS — D649 Anemia, unspecified: Secondary | ICD-10-CM | POA: Diagnosis not present

## 2014-02-22 DIAGNOSIS — D72819 Decreased white blood cell count, unspecified: Secondary | ICD-10-CM | POA: Diagnosis not present

## 2014-02-22 DIAGNOSIS — K2270 Barrett's esophagus without dysplasia: Secondary | ICD-10-CM | POA: Diagnosis not present

## 2014-02-22 DIAGNOSIS — G43909 Migraine, unspecified, not intractable, without status migrainosus: Secondary | ICD-10-CM | POA: Diagnosis not present

## 2014-02-22 DIAGNOSIS — IMO0002 Reserved for concepts with insufficient information to code with codable children: Secondary | ICD-10-CM | POA: Diagnosis not present

## 2014-02-22 DIAGNOSIS — I130 Hypertensive heart and chronic kidney disease with heart failure and stage 1 through stage 4 chronic kidney disease, or unspecified chronic kidney disease: Secondary | ICD-10-CM | POA: Diagnosis not present

## 2014-02-22 DIAGNOSIS — A630 Anogenital (venereal) warts: Secondary | ICD-10-CM | POA: Diagnosis not present

## 2014-02-22 DIAGNOSIS — G8918 Other acute postprocedural pain: Secondary | ICD-10-CM | POA: Diagnosis not present

## 2014-02-22 DIAGNOSIS — E785 Hyperlipidemia, unspecified: Secondary | ICD-10-CM | POA: Diagnosis not present

## 2014-02-22 DIAGNOSIS — Z86010 Personal history of colonic polyps: Secondary | ICD-10-CM | POA: Diagnosis not present

## 2014-02-22 DIAGNOSIS — E119 Type 2 diabetes mellitus without complications: Secondary | ICD-10-CM | POA: Diagnosis not present

## 2014-02-22 DIAGNOSIS — F411 Generalized anxiety disorder: Secondary | ICD-10-CM | POA: Diagnosis not present

## 2014-02-22 DIAGNOSIS — K219 Gastro-esophageal reflux disease without esophagitis: Secondary | ICD-10-CM | POA: Diagnosis not present

## 2014-02-22 DIAGNOSIS — M1990 Unspecified osteoarthritis, unspecified site: Secondary | ICD-10-CM | POA: Diagnosis not present

## 2014-02-22 DIAGNOSIS — N6019 Diffuse cystic mastopathy of unspecified breast: Secondary | ICD-10-CM | POA: Diagnosis not present

## 2014-02-22 DIAGNOSIS — Z96659 Presence of unspecified artificial knee joint: Secondary | ICD-10-CM | POA: Diagnosis not present

## 2014-02-22 DIAGNOSIS — M1710 Unilateral primary osteoarthritis, unspecified knee: Secondary | ICD-10-CM | POA: Diagnosis not present

## 2014-02-22 DIAGNOSIS — M129 Arthropathy, unspecified: Secondary | ICD-10-CM | POA: Diagnosis not present

## 2014-02-22 DIAGNOSIS — N039 Chronic nephritic syndrome with unspecified morphologic changes: Secondary | ICD-10-CM | POA: Diagnosis not present

## 2014-02-22 LAB — GLUCOSE, CAPILLARY
GLUCOSE-CAPILLARY: 132 mg/dL — AB (ref 70–99)
GLUCOSE-CAPILLARY: 88 mg/dL (ref 70–99)

## 2014-02-22 LAB — CBC
HCT: 24.6 % — ABNORMAL LOW (ref 36.0–46.0)
HEMOGLOBIN: 8.4 g/dL — AB (ref 12.0–15.0)
MCH: 29.7 pg (ref 26.0–34.0)
MCHC: 34.1 g/dL (ref 30.0–36.0)
MCV: 86.9 fL (ref 78.0–100.0)
Platelets: 115 10*3/uL — ABNORMAL LOW (ref 150–400)
RBC: 2.83 MIL/uL — AB (ref 3.87–5.11)
RDW: 13.1 % (ref 11.5–15.5)
WBC: 3.2 10*3/uL — ABNORMAL LOW (ref 4.0–10.5)

## 2014-02-22 MED ORDER — METHOCARBAMOL 500 MG PO TABS: 500.0000 mg | ORAL_TABLET | Freq: Four times a day (QID) | ORAL | Status: DC | PRN

## 2014-02-22 MED ORDER — HYDROCODONE-ACETAMINOPHEN 7.5-325 MG PO TABS: 1.0000 | ORAL_TABLET | ORAL | Status: DC | PRN

## 2014-02-22 MED ORDER — ASPIRIN 325 MG PO TBEC: 325.0000 mg | DELAYED_RELEASE_TABLET | Freq: Two times a day (BID) | ORAL | Status: DC

## 2014-02-22 NOTE — Discharge Summary (Signed)
Patient ID: KANCHAN GAL MRN: [id number] DOB/AGE: 10/25/1950 63 y.o.  Admit date: 02/20/2014 Discharge date: 02/22/2014  Admission Diagnoses:  Principal Problem:   Left knee DJD Active Problems:   Type II or unspecified type diabetes mellitus without mention of complication, not stated as uncontrolled   Postoperative anemia due to acute blood loss    Discharge Diagnoses:  Same  Past Medical History  Diagnosis Date  . Anxiety   . Arthritis   . Diabetes mellitus     borderline  . GERD (gastroesophageal reflux disease)   . Hyperlipidemia   . Insomnia, unspecified   . Leukocytopenia, unspecified   . Barrett's esophagus   . Breast fibrocystic disorder   . Genital warts   . Osteoarthrosis, unspecified whether generalized or localized, ankle and foot     ankle/foot  . Exercise induced bronchospasm   . Personal history of colonic polyps   . Migraine, unspecified, without mention of intractable migraine without mention of status migrainosus   . Symptomatic menopausal or female climacteric states   . Obesity, unspecified   . Essential hypertension, benign   . Depressive disorder, not elsewhere classified   . Anemia, unspecified   . Chicken pox   . Measles   . Mumps   . Breast cancer     right breast invasive ductal carcinoma   . Infected postoperative breast seroma   . Bronchitis   . Constipation, chronic     Surgeries: Procedure(s): TOTAL KNEE ARTHROPLASTY on 02/20/2014   Consultants:    Discharged Condition: Improved  Hospital Course: Genene Kilman Flax is an 63 y.o. female who was admitted 02/20/2014 for operative treatment ofLeft knee DJD. Patient has severe unremitting pain that affects sleep, daily activities, and work/hobbies. After pre-op clearance the patient was taken to the operating room on 02/20/2014 and underwent  Procedure(s): TOTAL KNEE ARTHROPLASTY.    Patient was given perioperative antibiotics: Anti-infectives   Start     Dose/Rate Route Frequency Ordered Stop    02/20/14 1930  vancomycin (VANCOCIN) IVPB 1000 mg/200 mL premix     1,000 mg 200 mL/hr over 60 Minutes Intravenous Every 12 hours 02/20/14 1253 02/20/14 2145   02/20/14 0600  vancomycin (VANCOCIN) IVPB 1000 mg/200 mL premix     1,000 mg 200 mL/hr over 60 Minutes Intravenous On call to O.R. 02/19/14 1440 02/20/14 0800       Patient was given sequential compression devices, early ambulation, and chemoprophylaxis to prevent DVT.  Patients Hgb is 8.4 but she is asymptomatic and doing great.  Patient benefited maximally from hospital stay and there were no complications.    Recent vital signs: Patient Vitals for the past 24 hrs:  BP Temp Temp src Pulse Resp SpO2  02/22/14 1023 95/42 mmHg - - - - -  02/22/14 0915 95/42 mmHg 99.1 F (37.3 C) Oral 75 - 98 %  02/22/14 0600 123/44 mmHg 100.1 F (37.8 C) - 65 16 100 %  02/22/14 0400 - - - - 17 100 %  02/22/14 0000 - - - - 19 100 %  02/21/14 2200 98/37 mmHg 98.4 F (36.9 C) - 72 16 98 %  02/21/14 2000 - - - - 16 99 %  02/21/14 1400 103/44 mmHg 99 F (37.2 C) Oral 84 18 98 %     Recent laboratory studies:  Recent Labs  02/21/14 0540 02/22/14 0707  WBC 4.2 3.2*  HGB 9.7* 8.4*  HCT 28.8* 24.6*  PLT 134* 115*  NA 136*  --  K 4.1  --   CL 99  --   CO2 26  --   BUN 8  --   CREATININE 0.66  --   GLUCOSE 139*  --   CALCIUM 8.5  --      Discharge Medications:     Medication List    STOP taking these medications       Diclofenac-Misoprostol 50-0.2 MG Tbec      TAKE these medications       albuterol 108 (90 BASE) MCG/ACT inhaler  Commonly known as:  PROVENTIL HFA;VENTOLIN HFA  Inhale 2 puffs into the lungs every 6 (six) hours as needed. Asthmatic attack     ALPRAZolam 0.5 MG tablet  Commonly known as:  XANAX  Take 0.5 mg by mouth at bedtime.     ANTIOXIDANT PO  Take 1 capsule by mouth daily. Fruit     aspirin 325 MG EC tablet  Take 1 tablet (325 mg total) by mouth 2 (two) times daily after a meal.      atorvastatin 20 MG tablet  Commonly known as:  LIPITOR  Take 1 tablet (20 mg total) by mouth daily.     CALCIUM 500 PO  Take 500 mg by mouth 3 (three) times daily.     CARNITINE PO  Take 1 capsule by mouth daily.     clindamycin 150 MG capsule  Commonly known as:  CLEOCIN  Take 600 mg by mouth as needed (dental work).     CoQ10 200 MG Caps  Take 200 mg by mouth daily.     Diindolylmethane Powd  1 Package by Does not apply route daily.     diphenhydrAMINE 25 MG tablet  Commonly known as:  BENADRYL  Take 25 mg by mouth at bedtime as needed for sleep. When not taking xanax.     docusate sodium 100 MG capsule  Commonly known as:  COLACE  Take 100 mg by mouth 2 (two) times daily.     esomeprazole 40 MG capsule  Commonly known as:  NEXIUM  Take 40 mg by mouth daily.     frovatriptan 2.5 MG tablet  Commonly known as:  FROVA  Take 2.5 mg by mouth as needed for migraine (max 3 tabs in 24 hours). If recurs, may repeat after 2 hours. Max of 3 tabs in 24 hours.     HYDROcodone-acetaminophen 7.5-325 MG per tablet  Commonly known as:  NORCO  Take 1-2 tablets by mouth every 4 (four) hours as needed for moderate pain.     lisinopril 5 MG tablet  Commonly known as:  PRINIVIL,ZESTRIL  Take 1 tablet (5 mg total) by mouth daily.     metFORMIN 500 MG 24 hr tablet  Commonly known as:  GLUCOPHAGE-XR  Take 500-1,000 mg by mouth daily with breakfast.     methocarbamol 500 MG tablet  Commonly known as:  ROBAXIN  Take 1 tablet (500 mg total) by mouth every 6 (six) hours as needed for muscle spasms.     OVER THE COUNTER MEDICATION  Take 1 capsule by mouth daily. Procast package: Elite-100 omega 3 vitamin     OVER THE COUNTER MEDICATION  Take 1 capsule by mouth daily. Procast: Memory,brain and liver vitamin     OVER THE COUNTER MEDICATION  Take 1 capsule by mouth daily. Circulation and vein support vitamin     PARoxetine 20 MG tablet  Commonly known as:  PAXIL  Take 20 mg by mouth  daily.  PROBIOTIC DAILY PO  Take 1 capsule by mouth daily.     simethicone 80 MG chewable tablet  Commonly known as:  MYLICON  Chew 80 mg by mouth every 6 (six) hours as needed for flatulence.        Diagnostic Studies: Dg Chest 2 View  02/13/2014   CLINICAL DATA:  Pre operative respiratory exam. Osteoarthritis of the knee.  EXAM: CHEST  2 VIEW  COMPARISON:  04/05/2006  FINDINGS: There is a 6 mm nodular density at the one of the lung bases posteriorly seen only on the lateral view deep posterior in the costophrenic sulcus. Lungs are otherwise clear. Heart size and vascularity are normal. No acute osseous abnormality. Surgical clips from previous right breast surgery.  IMPRESSION: There appears to be a 6 mm nodule at one of the lung bases posteriorly. Is a not visible on the prior exam. I recommend a CT scan of the chest without contrast for further evaluation.   Electronically Signed   By: Rozetta Nunnery M.D.   On: 02/13/2014 13:52    Disposition: 01-Home or Self Care      Discharge Instructions   Call MD / Call 911    Complete by:  As directed   If you experience chest pain or shortness of breath, CALL 911 and be transported to the hospital emergency room.  If you develope a fever above 101 F, pus (white drainage) or increased drainage or redness at the wound, or calf pain, call your surgeon's office.     Constipation Prevention    Complete by:  As directed   Drink plenty of fluids.  Prune juice may be helpful.  You may use a stool softener, such as Colace (over the counter) 100 mg twice a day.  Use MiraLax (over the counter) for constipation as needed.     Diet - low sodium heart healthy    Complete by:  As directed      Increase activity slowly as tolerated    Complete by:  As directed            Follow-up Information   Follow up with Hessie Dibble, MD. Call in 2 weeks.   Specialty:  Orthopedic Surgery   Contact information:   [place]  [phone]        Signed: Rich Fuchs 02/22/2014, 12:14 PM

## 2014-02-22 NOTE — Progress Notes (Signed)
Patient discharged to [facility]. Report called to Estill Bamberg, RN at facility. IV was removed and surgival dressing was changed. Patient left unit in a stable condition via wheelchair.

## 2014-02-22 NOTE — Progress Notes (Signed)
Patient for discharge to SNF bed at [facility] today. Plans confirmed with patient and family who are in agreement. SNF is prepared for patient and pt.'s husband will transport. SW offered to transport pt via [facility] pt declined because of financial issues.    Charlene Brooke, MSW  Clinical Social Worker  [phone]  .

## 2014-02-22 NOTE — Progress Notes (Signed)
Orthopedic Tech Progress Note Patient Details:  Alisha Matthews 01-Feb-1951 [id number]  Patient ID: Alisha Matthews, female   DOB: [birth year], 63 y.o.   MRN: [id number] Placed pt's lle in cpm @ 0-60 degrees @1440   Hildred Priest 02/22/2014, 2:36 PM

## 2014-02-22 NOTE — Care Management Note (Signed)
CARE MANAGEMENT NOTE 02/22/2014  Patient:  Alisha Matthews, Alisha Matthews   Account Number:  [id number]  Date Initiated:  02/22/2014  Documentation initiated by:  Ricki Miller  Subjective/Objective Assessment:   63 yr old female admitted with left knee DJD, s/p left total knee arthroplasty.     Action/Plan:   Patient is for shortterm rehab at [facility]. Social worker is aware.   Anticipated DC Date:  02/22/2014   Anticipated DC Plan:  SKILLED NURSING FACILITY  In-house referral  Clinical Social Worker      DC Planning Services  CM consult      [facility] Choice  NA   Choice offered to / List presented to:     DME arranged  NA      DME agency  [employer]     HH arranged  NA      Status of service:  Completed, signed off Medicare Important Message given?  NA - LOS <3 / Initial given by admissions (If response is "NO", the following Medicare IM given date fields will be blank) Date Medicare IM given:   Medicare IM given by:   Date Additional Medicare IM given:   Additional Medicare IM given by:    Discharge Disposition:  [facility]  Per UR Regulation:  Reviewed for med. necessity/level of care/duration of stay

## 2014-02-22 NOTE — Progress Notes (Signed)
Subjective: 2 Days Post-Op Procedure(s) (LRB): TOTAL KNEE ARTHROPLASTY (Left) Patient sitting up in chair alert and feeling great. She is telling me about how well she did up with therapy.  Activity level:  wbat Diet tolerance:  ok Voiding:  ok Patient reports pain as mild.    Objective: Vital signs in last 24 hours: Temp:  [98.4 F (36.9 C)-100.1 F (37.8 C)] 99.1 F (37.3 C) (09/03 0915) Pulse Rate:  [65-84] 75 (09/03 0915) Resp:  [16-19] 16 (09/03 0600) BP: (95-123)/(37-44) 95/42 mmHg (09/03 1023) SpO2:  [98 %-100 %] 98 % (09/03 0915)  Labs:  Recent Labs  02/20/14 0639 02/21/14 0540 02/22/14 0707  HGB 10.8* 9.7* 8.4*    Recent Labs  02/21/14 0540 02/22/14 0707  WBC 4.2 3.2*  RBC 3.25* 2.83*  HCT 28.8* 24.6*  PLT 134* 115*    Recent Labs  02/21/14 0540  NA 136*  K 4.1  CL 99  CO2 26  BUN 8  CREATININE 0.66  GLUCOSE 139*  CALCIUM 8.5   No results found for this basename: LABPT, INR,  in the last 72 hours  Physical Exam:  Neurologically intact ABD soft Neurovascular intact Sensation intact distally Intact pulses distally Dorsiflexion/Plantar flexion intact Incision: dressing C/D/I and no drainage No cellulitis present Compartment soft  Assessment/Plan:  2 Days Post-Op Procedure(s) (LRB): TOTAL KNEE ARTHROPLASTY (Left) Advance diet Up with therapy Discharge to SNF to [facility] today. Dressing changed to mepilex. Patients hgb was 8.4 but she is not symptomatic and feels great. Continue on ASA 325mg  BID x 2 weeks. Follow up in office 2 weeks post op.    Buford Bremer, Larwance Sachs 02/22/2014, 12:07 PM

## 2014-02-22 NOTE — Progress Notes (Signed)
Physical Therapy Treatment Patient Details Name: Alisha Matthews MRN: [id number] DOB: Aug 14, 1950 Today's Date: 02/22/2014    History of Present Illness 63 y.o. female admitted to [facility] on 02/20/14 for elective L TKA.  Pt with significant PMHx of L THA (13 years ago), OA, exercise induced bronchospasm, migraine, HTN, anemia, breast CA (s/p lumpectomy, and left foot surgery.     PT Comments    Patient making much better progress with therapy and appears less anxious now that pain is better controlled. Able to ambulate in hallway this AM. Continue to recommend SNF as patients husband works and she does not have assistance at home.   Follow Up Recommendations  SNF     Equipment Recommendations  None recommended by PT    Recommendations for Other Services       Precautions / Restrictions Precautions Precautions: Knee Required Braces or Orthoses: Knee Immobilizer - Left Knee Immobilizer - Left: On at all times Restrictions Weight Bearing Restrictions: Yes LLE Weight Bearing: Weight bearing as tolerated    Mobility  Bed Mobility               General bed mobility comments: Patient up in recliner before and after session.  Transfers Overall transfer level: Needs assistance Equipment used: Rolling walker (2 wheeled)   Sit to Stand: Min guard         General transfer comment: Min guard assist for safety. Verbal cues for safe hand placement.   Ambulation/Gait Ambulation/Gait assistance: Min guard Ambulation Distance (Feet): 120 Feet Assistive device: Rolling walker (2 wheeled) Gait Pattern/deviations: Step-to pattern;Decreased stance time - left;Decreased step length - right   Gait velocity interpretation: Below normal speed for age/gender General Gait Details: Patient with good technique using RW. Cues for upright posture   Stairs            Wheelchair Mobility    Modified Rankin (Stroke Patients Only)       Balance                                    Cognition Arousal/Alertness: Awake/alert Behavior During Therapy: WFL for tasks assessed/performed Overall Cognitive Status: Within Functional Limits for tasks assessed                      Exercises Total Joint Exercises Quad Sets: AROM;Left;10 reps Heel Slides: AAROM;Left;10 reps Hip ABduction/ADduction: AAROM;Left;10 reps Straight Leg Raises: AAROM;Left;10 reps    General Comments        Pertinent Vitals/Pain Pain Score: 4  Pain Intervention(s): Monitored during session    Home Living                      Prior Function            PT Goals (current goals can now be found in the care plan section) Progress towards PT goals: Progressing toward goals    Frequency  7X/week    PT Plan Current plan remains appropriate    Co-evaluation             End of Session Equipment Utilized During Treatment: Gait belt;Left knee immobilizer Activity Tolerance: Patient tolerated treatment well Patient left: with call bell/phone within reach;with family/visitor present;in chair     Time: 3545-6256 PT Time Calculation (min): 25 min  Charges:  $Gait Training: 8-22 mins $Therapeutic Exercise: 8-22 mins  G Codes:      Jacqualyn Posey 02/22/2014, 11:38 AM 02/22/2014 Jacqualyn Posey PTA [phone] pager [phone] office

## 2014-02-26 DIAGNOSIS — I130 Hypertensive heart and chronic kidney disease with heart failure and stage 1 through stage 4 chronic kidney disease, or unspecified chronic kidney disease: Secondary | ICD-10-CM | POA: Diagnosis not present

## 2014-02-26 DIAGNOSIS — D649 Anemia, unspecified: Secondary | ICD-10-CM | POA: Diagnosis not present

## 2014-02-26 DIAGNOSIS — N039 Chronic nephritic syndrome with unspecified morphologic changes: Secondary | ICD-10-CM | POA: Diagnosis not present

## 2014-02-26 DIAGNOSIS — Z96659 Presence of unspecified artificial knee joint: Secondary | ICD-10-CM | POA: Diagnosis not present

## 2014-02-26 DIAGNOSIS — G8918 Other acute postprocedural pain: Secondary | ICD-10-CM | POA: Diagnosis not present

## 2014-03-01 ENCOUNTER — Encounter: Payer: Self-pay | Admitting: Gastroenterology

## 2014-03-03 DIAGNOSIS — F329 Major depressive disorder, single episode, unspecified: Secondary | ICD-10-CM | POA: Diagnosis not present

## 2014-03-03 DIAGNOSIS — Z96652 Presence of left artificial knee joint: Secondary | ICD-10-CM | POA: Diagnosis not present

## 2014-03-03 DIAGNOSIS — Z96659 Presence of unspecified artificial knee joint: Secondary | ICD-10-CM | POA: Diagnosis not present

## 2014-03-03 DIAGNOSIS — F419 Anxiety disorder, unspecified: Secondary | ICD-10-CM | POA: Diagnosis not present

## 2014-03-03 DIAGNOSIS — E119 Type 2 diabetes mellitus without complications: Secondary | ICD-10-CM | POA: Diagnosis not present

## 2014-03-03 DIAGNOSIS — Z471 Aftercare following joint replacement surgery: Secondary | ICD-10-CM | POA: Diagnosis not present

## 2014-03-05 DIAGNOSIS — M1710 Unilateral primary osteoarthritis, unspecified knee: Secondary | ICD-10-CM | POA: Diagnosis not present

## 2014-03-07 DIAGNOSIS — F419 Anxiety disorder, unspecified: Secondary | ICD-10-CM | POA: Diagnosis not present

## 2014-03-07 DIAGNOSIS — E119 Type 2 diabetes mellitus without complications: Secondary | ICD-10-CM | POA: Diagnosis not present

## 2014-03-07 DIAGNOSIS — F329 Major depressive disorder, single episode, unspecified: Secondary | ICD-10-CM | POA: Diagnosis not present

## 2014-03-07 DIAGNOSIS — Z96652 Presence of left artificial knee joint: Secondary | ICD-10-CM | POA: Diagnosis not present

## 2014-03-07 DIAGNOSIS — Z471 Aftercare following joint replacement surgery: Secondary | ICD-10-CM | POA: Diagnosis not present

## 2014-03-08 DIAGNOSIS — F419 Anxiety disorder, unspecified: Secondary | ICD-10-CM | POA: Diagnosis not present

## 2014-03-08 DIAGNOSIS — Z96652 Presence of left artificial knee joint: Secondary | ICD-10-CM | POA: Diagnosis not present

## 2014-03-08 DIAGNOSIS — F329 Major depressive disorder, single episode, unspecified: Secondary | ICD-10-CM | POA: Diagnosis not present

## 2014-03-08 DIAGNOSIS — Z471 Aftercare following joint replacement surgery: Secondary | ICD-10-CM | POA: Diagnosis not present

## 2014-03-08 DIAGNOSIS — E119 Type 2 diabetes mellitus without complications: Secondary | ICD-10-CM | POA: Diagnosis not present

## 2014-03-09 DIAGNOSIS — Z96652 Presence of left artificial knee joint: Secondary | ICD-10-CM | POA: Diagnosis not present

## 2014-03-09 DIAGNOSIS — Z471 Aftercare following joint replacement surgery: Secondary | ICD-10-CM | POA: Diagnosis not present

## 2014-03-09 DIAGNOSIS — E119 Type 2 diabetes mellitus without complications: Secondary | ICD-10-CM | POA: Diagnosis not present

## 2014-03-09 DIAGNOSIS — F329 Major depressive disorder, single episode, unspecified: Secondary | ICD-10-CM | POA: Diagnosis not present

## 2014-03-09 DIAGNOSIS — F419 Anxiety disorder, unspecified: Secondary | ICD-10-CM | POA: Diagnosis not present

## 2014-03-12 DIAGNOSIS — Z96652 Presence of left artificial knee joint: Secondary | ICD-10-CM | POA: Diagnosis not present

## 2014-03-12 DIAGNOSIS — Z471 Aftercare following joint replacement surgery: Secondary | ICD-10-CM | POA: Diagnosis not present

## 2014-03-12 DIAGNOSIS — E119 Type 2 diabetes mellitus without complications: Secondary | ICD-10-CM | POA: Diagnosis not present

## 2014-03-12 DIAGNOSIS — F329 Major depressive disorder, single episode, unspecified: Secondary | ICD-10-CM | POA: Diagnosis not present

## 2014-03-12 DIAGNOSIS — F419 Anxiety disorder, unspecified: Secondary | ICD-10-CM | POA: Diagnosis not present

## 2014-03-14 DIAGNOSIS — Z471 Aftercare following joint replacement surgery: Secondary | ICD-10-CM | POA: Diagnosis not present

## 2014-03-14 DIAGNOSIS — Z96652 Presence of left artificial knee joint: Secondary | ICD-10-CM | POA: Diagnosis not present

## 2014-03-14 DIAGNOSIS — F329 Major depressive disorder, single episode, unspecified: Secondary | ICD-10-CM | POA: Diagnosis not present

## 2014-03-14 DIAGNOSIS — F419 Anxiety disorder, unspecified: Secondary | ICD-10-CM | POA: Diagnosis not present

## 2014-03-14 DIAGNOSIS — E119 Type 2 diabetes mellitus without complications: Secondary | ICD-10-CM | POA: Diagnosis not present

## 2014-03-16 DIAGNOSIS — F329 Major depressive disorder, single episode, unspecified: Secondary | ICD-10-CM | POA: Diagnosis not present

## 2014-03-16 DIAGNOSIS — Z96652 Presence of left artificial knee joint: Secondary | ICD-10-CM | POA: Diagnosis not present

## 2014-03-16 DIAGNOSIS — Z471 Aftercare following joint replacement surgery: Secondary | ICD-10-CM | POA: Diagnosis not present

## 2014-03-16 DIAGNOSIS — E119 Type 2 diabetes mellitus without complications: Secondary | ICD-10-CM | POA: Diagnosis not present

## 2014-03-16 DIAGNOSIS — F419 Anxiety disorder, unspecified: Secondary | ICD-10-CM | POA: Diagnosis not present

## 2014-03-19 ENCOUNTER — Ambulatory Visit (INDEPENDENT_AMBULATORY_CARE_PROVIDER_SITE_OTHER): Payer: Medicare Other | Admitting: Family Medicine

## 2014-03-19 ENCOUNTER — Encounter: Payer: Self-pay | Admitting: Family Medicine

## 2014-03-19 ENCOUNTER — Other Ambulatory Visit: Payer: Self-pay | Admitting: Radiology

## 2014-03-19 VITALS — BP 120/80 | HR 73 | Temp 98.4°F | Resp 16 | Ht 63.0 in | Wt 155.0 lb

## 2014-03-19 DIAGNOSIS — Z23 Encounter for immunization: Secondary | ICD-10-CM | POA: Diagnosis not present

## 2014-03-19 DIAGNOSIS — F329 Major depressive disorder, single episode, unspecified: Secondary | ICD-10-CM | POA: Diagnosis not present

## 2014-03-19 DIAGNOSIS — E7800 Pure hypercholesterolemia, unspecified: Secondary | ICD-10-CM

## 2014-03-19 DIAGNOSIS — D649 Anemia, unspecified: Secondary | ICD-10-CM | POA: Diagnosis not present

## 2014-03-19 DIAGNOSIS — Z471 Aftercare following joint replacement surgery: Secondary | ICD-10-CM | POA: Diagnosis not present

## 2014-03-19 DIAGNOSIS — I10 Essential (primary) hypertension: Secondary | ICD-10-CM

## 2014-03-19 DIAGNOSIS — M1710 Unilateral primary osteoarthritis, unspecified knee: Secondary | ICD-10-CM

## 2014-03-19 DIAGNOSIS — M1712 Unilateral primary osteoarthritis, left knee: Secondary | ICD-10-CM

## 2014-03-19 DIAGNOSIS — F341 Dysthymic disorder: Secondary | ICD-10-CM

## 2014-03-19 DIAGNOSIS — F419 Anxiety disorder, unspecified: Secondary | ICD-10-CM | POA: Diagnosis not present

## 2014-03-19 DIAGNOSIS — E119 Type 2 diabetes mellitus without complications: Secondary | ICD-10-CM

## 2014-03-19 DIAGNOSIS — Z96652 Presence of left artificial knee joint: Secondary | ICD-10-CM | POA: Diagnosis not present

## 2014-03-19 LAB — POCT CBC
GRANULOCYTE PERCENT: 52.6 % (ref 37–80)
HCT, POC: 33 % — AB (ref 37.7–47.9)
Hemoglobin: 11 g/dL — AB (ref 12.2–16.2)
Lymph, poc: 1.4 (ref 0.6–3.4)
MCH, POC: 29.6 pg (ref 27–31.2)
MCHC: 33.2 g/dL (ref 31.8–35.4)
MCV: 88.9 fL (ref 80–97)
MID (CBC): 0.3 (ref 0–0.9)
MPV: 7 fL (ref 0–99.8)
PLATELET COUNT, POC: 176 10*3/uL (ref 142–424)
POC Granulocyte: 1.8 — AB (ref 2–6.9)
POC LYMPH PERCENT: 39.7 %L (ref 10–50)
POC MID %: 7.7 %M (ref 0–12)
RBC: 3.71 M/uL — AB (ref 4.04–5.48)
RDW, POC: 14.5 %
WBC: 3.5 10*3/uL — AB (ref 4.6–10.2)

## 2014-03-19 LAB — COMPLETE METABOLIC PANEL WITH GFR
ALK PHOS: 74 U/L (ref 39–117)
ALT: 17 U/L (ref 0–35)
AST: 23 U/L (ref 0–37)
Albumin: 4.2 g/dL (ref 3.5–5.2)
BILIRUBIN TOTAL: 0.4 mg/dL (ref 0.2–1.2)
BUN: 9 mg/dL (ref 6–23)
CO2: 27 mEq/L (ref 19–32)
CREATININE: 0.64 mg/dL (ref 0.50–1.10)
Calcium: 9.5 mg/dL (ref 8.4–10.5)
Chloride: 102 mEq/L (ref 96–112)
GFR, Est African American: >89 mL/min
GFR, Est Non African American: >89 mL/min
Glucose, Bld: 100 mg/dL — ABNORMAL HIGH (ref 70–99)
Potassium: 4 mEq/L (ref 3.5–5.3)
SODIUM: 139 meq/L (ref 135–145)
TOTAL PROTEIN: 6.4 g/dL (ref 6.0–8.3)

## 2014-03-19 LAB — LIPID PANEL
CHOL/HDL RATIO: 2.8 ratio
Cholesterol: 183 mg/dL (ref 0–200)
HDL: 65 mg/dL (ref >39)
LDL Cholesterol: 99 mg/dL (ref 0–99)
Triglycerides: 94 mg/dL (ref <150)
VLDL: 19 mg/dL (ref 0–40)

## 2014-03-19 LAB — POCT GLYCOSYLATED HEMOGLOBIN (HGB A1C): HEMOGLOBIN A1C: 5.6

## 2014-03-19 MED ORDER — ALPRAZOLAM 0.5 MG PO TABS: 0.5000 mg | ORAL_TABLET | Freq: Every evening | ORAL | Status: DC | PRN

## 2014-03-19 MED ORDER — METFORMIN HCL ER (MOD) 500 MG PO TB24: 500.0000 mg | ORAL_TABLET | Freq: Two times a day (BID) | ORAL | Status: DC

## 2014-03-19 MED ORDER — LISINOPRIL 5 MG PO TABS: 5.0000 mg | ORAL_TABLET | Freq: Every day | ORAL | Status: DC

## 2014-03-19 MED ORDER — PAROXETINE HCL 20 MG PO TABS: 20.0000 mg | ORAL_TABLET | Freq: Every day | ORAL | Status: DC

## 2014-03-19 MED ORDER — ATORVASTATIN CALCIUM 20 MG PO TABS: 20.0000 mg | ORAL_TABLET | Freq: Every day | ORAL | Status: DC

## 2014-03-19 MED ORDER — METFORMIN HCL ER 500 MG PO TB24
ORAL_TABLET | ORAL | Status: DC
Start: 2014-03-19 — End: 2014-03-19

## 2014-03-19 MED ORDER — METFORMIN HCL ER 500 MG PO TB24: ORAL_TABLET | ORAL | Status: DC

## 2014-03-19 NOTE — Telephone Encounter (Signed)
[name] is very expensive, pharmacy asking if we can substitute/ change to Metformin ER. Call back for pharmacy is [phone]/ corrected rx per Dr Tamala Julian.

## 2014-03-19 NOTE — Progress Notes (Signed)
Subjective:    Patient ID: Alisha Matthews, female    DOB: [birth year], 63 y.o.   MRN: [id number]  03/19/2014  Follow-up, Medication Refill, Hyperlipidemia, Diabetes and Anxiety   HPI This 63 y.o. female presents for six month follow-up:  1.  L Total Knee Replacement:  Still on Robaxin and Percocet; taking Percocet q 4-6 hours.  Taking Robaxin bid.  Doing PT with [place].  Also plans to participate in outpatient rehabilitation.  Requesting refill of medications.  Surgery on 02/20/2014.  Sent to rehab facility; food was horrible; served [employer].  Was maintained on ASA 325mg  bid during admission; no Coumadin used.  Transferred to rehab on POD#2.    2.  DMII:  Sugars increased post-operatively to 200+.  Was maintained on SSI during rehab.  Checking sugars 110-119.  Eating better now since out of rehab.  Increase Metformin bid.  Highest sugar was 240.  Average sugars of 130-140.  Requesting A1c in house.    3.  Insomnia: cannot sleep; unable to get comfortable due to recent knee surgery.  Taking Xanax qhs.  Pan wakes patient up a lot.    4.  Anxiety and depression:  Worsening since recent knee surgery; irritable, angry. Short-tempered.  Taking Paxil sporadically.  Very angry with care she received in the hospital.  Not sleeping because of persistent knee pain.  Frustrated by persistent pain.  Having to take Xanax qhs for insomnia which has worsened s/p recent knee replacement.  5.  Hyperlipidemia: no changes to management made at last visit.  Patient reports good compliance with medication, good tolerance to medication, and good symptom control.      Review of Systems  Constitutional: Negative for fever, chills, diaphoresis and fatigue.  HENT: Positive for congestion, postnasal drip and rhinorrhea. Negative for ear discharge and sore throat.   Eyes: Negative for visual disturbance.  Respiratory: Negative for cough and shortness of breath.   Cardiovascular: Negative for chest pain,  palpitations and leg swelling.  Gastrointestinal: Negative for nausea, vomiting, abdominal pain, diarrhea and constipation.  Endocrine: Negative for cold intolerance, heat intolerance, polydipsia, polyphagia and polyuria.  Musculoskeletal: Positive for arthralgias and joint swelling.  Skin: Negative for color change, pallor, rash and wound.  Neurological: Negative for dizziness, tremors, seizures, syncope, facial asymmetry, speech difficulty, weakness, light-headedness, numbness and headaches.  Psychiatric/Behavioral: Positive for sleep disturbance and dysphoric mood. Negative for suicidal ideas and self-injury. The patient is nervous/anxious.     Past Medical History  Diagnosis Date  . Anxiety   . Arthritis   . Diabetes mellitus     borderline  . GERD (gastroesophageal reflux disease)   . Hyperlipidemia   . Insomnia, unspecified   . Leukocytopenia, unspecified   . Barrett's esophagus   . Breast fibrocystic disorder   . Genital warts   . Osteoarthrosis, unspecified whether generalized or localized, ankle and foot     ankle/foot  . Exercise induced bronchospasm   . Personal history of colonic polyps   . Migraine, unspecified, without mention of intractable migraine without mention of status migrainosus   . Symptomatic menopausal or female climacteric states   . Obesity, unspecified   . Essential hypertension, benign   . Depressive disorder, not elsewhere classified   . Anemia, unspecified   . Chicken pox   . Measles   . Mumps   . Breast cancer     right breast invasive ductal carcinoma   . Infected postoperative breast seroma   . Bronchitis   .  Constipation, chronic    Past Surgical History  Procedure Laterality Date  . Cholecystectomy    . Bunionectomy      3 surgeries on both feet  . Nasal septum surgery    . Total hip arthroplasty      left      Perthes disease  . Foot surgery      Left-revision  . Dilation and curettage of uterus  1978  . Breast lumpectomy with  needle localization and axillary sentinel lymph node bx  06/08/2012    Procedure: BREAST LUMPECTOMY WITH NEEDLE LOCALIZATION AND AXILLARY SENTINEL LYMPH NODE BX;  Surgeon: Rolm Bookbinder, MD;  Location: [facility];  Service: General;  Laterality: Right;  . Re-excision of breast cancer,superior margins  06/27/2012    Procedure: RE-EXCISION OF BREAST CANCER,SUPERIOR MARGINS;  Surgeon: Rolm Bookbinder, MD;  Location: [facility];  Service: General;  Laterality: Right;  . Eye surgery      Orbital right eye surgery  . Colonoscopy    . Total knee arthroplasty Left 02/20/2014    Procedure: TOTAL KNEE ARTHROPLASTY;  Surgeon: Hessie Dibble, MD;  Location: [facility];  Service: Orthopedics;  Laterality: Left;   Allergies  Allergen Reactions  . Penicillins Rash   Current Outpatient Prescriptions  Medication Sig Dispense Refill  . albuterol (PROVENTIL HFA;VENTOLIN HFA) 108 (90 BASE) MCG/ACT inhaler Inhale 2 puffs into the lungs every 6 (six) hours as needed. Asthmatic attack  18 g  3  . aspirin EC 325 MG EC tablet Take 1 tablet (325 mg total) by mouth 2 (two) times daily after a meal.  30 tablet  0  . atorvastatin (LIPITOR) 20 MG tablet Take 1 tablet (20 mg total) by mouth daily.  90 tablet  3  . Calcium-Magnesium-Vitamin D (CALCIUM 500 PO) Take 500 mg by mouth 3 (three) times daily.      . clindamycin (CLEOCIN) 150 MG capsule Take 600 mg by mouth as needed (dental work).       . Coenzyme Q10 (COQ10) 200 MG CAPS Take 200 mg by mouth daily.      Marland Kitchen Diindolylmethane POWD 1 Package by Does not apply route daily.      . diphenhydrAMINE (BENADRYL) 25 MG tablet Take 25 mg by mouth at bedtime as needed for sleep. When not taking xanax.      . docusate sodium (COLACE) 100 MG capsule Take 100 mg by mouth 2 (two) times daily.      . frovatriptan (FROVA) 2.5 MG tablet Take 2.5 mg by mouth as needed for migraine (max 3 tabs in 24 hours). If recurs, may repeat after 2 hours. Max of 3 tabs  in 24 hours.      . LevOCARNitine (CARNITINE PO) Take 1 capsule by mouth daily.      Marland Kitchen lisinopril (PRINIVIL,ZESTRIL) 5 MG tablet Take 1 tablet (5 mg total) by mouth daily.  90 tablet  3  . methocarbamol (ROBAXIN) 500 MG tablet Take 1 tablet (500 mg total) by mouth every 6 (six) hours as needed for muscle spasms.  50 tablet  0  . Multiple Vitamins-Minerals (ANTIOXIDANT PO) Take 1 capsule by mouth daily. Fruit      . OVER THE COUNTER MEDICATION Take 1 capsule by mouth daily. Procast package: Elite-100 omega 3 vitamin      . OVER THE COUNTER MEDICATION Take 1 capsule by mouth daily. Procast: Memory,brain and liver vitamin      . PRESCRIPTION MEDICATION Percocet 5 mg  taking every 4-6 hours for pain      . Probiotic Product (PROBIOTIC DAILY PO) Take 1 capsule by mouth daily.      . simethicone (MYLICON) 80 MG chewable tablet Chew 80 mg by mouth every 6 (six) hours as needed for flatulence.      . ALPRAZolam (XANAX) 0.5 MG tablet Take 1 tablet (0.5 mg total) by mouth at bedtime as needed for anxiety.  30 tablet  5  . HYDROcodone-acetaminophen (NORCO) 7.5-325 MG per tablet Take 1-2 tablets by mouth every 4 (four) hours as needed for moderate pain.  50 tablet  0  . metFORMIN (GLUMETZA) 500 MG (MOD) 24 hr tablet Take 1 tablet (500 mg total) by mouth 2 (two) times daily with a meal.  180 tablet  3  . OVER THE COUNTER MEDICATION Take 1 capsule by mouth daily. Circulation and vein support vitamin      . PARoxetine (PAXIL) 20 MG tablet Take 1 tablet (20 mg total) by mouth daily.  90 tablet  3   No current facility-administered medications for this visit.       Objective:    BP 120/80  Pulse 73  Temp(Src) 98.4 F (36.9 C) (Oral)  Resp 16  Ht 5\' 3"  (1.6 m)  Wt 155 lb (70.308 kg)  BMI 27.46 kg/m2  SpO2 96%  LMP 06/22/2000 Physical Exam  Constitutional: She is oriented to person, place, and time. She appears well-developed and well-nourished. No distress.  Ambulating with cane.  HENT:  Head:  Normocephalic and atraumatic.  Right Ear: External ear normal.  Left Ear: External ear normal.  Nose: Nose normal.  Mouth/Throat: Oropharynx is clear and moist.  Eyes: Conjunctivae and EOM are normal. Pupils are equal, round, and reactive to light.  Neck: Normal range of motion. Neck supple. Carotid bruit is not present. No thyromegaly present.  Cardiovascular: Normal rate, regular rhythm, normal heart sounds and intact distal pulses.  Exam reveals no gallop and no friction rub.   No murmur heard. Pulmonary/Chest: Effort normal and breath sounds normal. She has no wheezes. She has no rales.  Lymphadenopathy:    She has no cervical adenopathy.  Neurological: She is alert and oriented to person, place, and time. No cranial nerve deficit.  Skin: Skin is warm and dry. No rash noted. She is not diaphoretic. No erythema. No pallor.  Psychiatric: She has a normal mood and affect. Her behavior is normal.   Results for orders placed in visit on 03/19/14  POCT GLYCOSYLATED HEMOGLOBIN (HGB A1C)      Result Value Ref Range   Hemoglobin A1C 5.6    POCT CBC      Result Value Ref Range   WBC 3.5 (*) 4.6 - 10.2 K/uL   Lymph, poc 1.4  0.6 - 3.4   POC LYMPH PERCENT 39.7  10 - 50 %L   MID (cbc) 0.3  0 - 0.9   POC MID % 7.7  0 - 12 %M   POC Granulocyte 1.8 (*) 2 - 6.9   Granulocyte percent 52.6  37 - 80 %G   RBC 3.71 (*) 4.04 - 5.48 M/uL   Hemoglobin 11.0 (*) 12.2 - 16.2 g/dL   HCT, POC 33.0 (*) 37.7 - 47.9 %   MCV 88.9  80 - 97 fL   MCH, POC 29.6  27 - 31.2 pg   MCHC 33.2  31.8 - 35.4 g/dL   RDW, POC 14.5     Platelet Count, POC 176  142 -  424 K/uL   MPV 7.0  0 - 99.8 fL   INFLUENZA VACCINE ADMINISTERED.    Assessment & Plan:   1. Essential hypertension, benign   2. Pure hypercholesterolemia   3. Type II or unspecified type diabetes mellitus without mention of complication, not stated as uncontrolled   4. Anemia, unspecified   5. Need for prophylactic vaccination and inoculation against  influenza   6. Anxiety and depression   7. Primary osteoarthritis of left knee     1. HTN: controlled; obtain labs; refill provided. 2.  Hypercholesterolemia: controlled; obtain labs; continue current medications. 3. DMII: controlled despite recent elevation in sugars post-operatively; increased Metformin to bid which is appropriate; no changes to management at this time. 4.  Anemia: improved from recent hospitalization following knee replacement.   5.  Anxiety and depression: worsening due to stressors of recent surgery, daily pain, non-compliance with SSRI; advised to take Paxil 20mg  daily; refill of Xanax also provided. 6.  L knee OA: improved; s/p TKR on 02/20/2014; doing well but suffering with persistent severe pain; follow up with ortho today.  Recommend scheduled Percocet qhs and more regular use during the day. Proceed with rehabilitation per ortho.  7.  S/p flu vaccine.   Meds ordered this encounter  Medications  . PRESCRIPTION MEDICATION    Sig: Percocet 5 mg  taking every 4-6 hours for pain  . ALPRAZolam (XANAX) 0.5 MG tablet    Sig: Take 1 tablet (0.5 mg total) by mouth at bedtime as needed for anxiety.    Dispense:  30 tablet    Refill:  5  . DISCONTD: atorvastatin (LIPITOR) 20 MG tablet    Sig: Take 1 tablet (20 mg total) by mouth daily.    Dispense:  90 tablet    Refill:  3  . DISCONTD: lisinopril (PRINIVIL,ZESTRIL) 5 MG tablet    Sig: Take 1 tablet (5 mg total) by mouth daily.    Dispense:  90 tablet    Refill:  3  . PARoxetine (PAXIL) 20 MG tablet    Sig: Take 1 tablet (20 mg total) by mouth daily.    Dispense:  90 tablet    Refill:  3  . lisinopril (PRINIVIL,ZESTRIL) 5 MG tablet    Sig: Take 1 tablet (5 mg total) by mouth daily.    Dispense:  90 tablet    Refill:  3  . atorvastatin (LIPITOR) 20 MG tablet    Sig: Take 1 tablet (20 mg total) by mouth daily.    Dispense:  90 tablet    Refill:  3  . metFORMIN (GLUMETZA) 500 MG (MOD) 24 hr tablet    Sig: Take 1  tablet (500 mg total) by mouth 2 (two) times daily with a meal.    Dispense:  180 tablet    Refill:  3    Return in about 3 months (around 06/18/2014) for recheck.    Reginia Forts, M.D.  [facility] [address] [place], [place]  [address] [phone] phone [phone] fax

## 2014-03-21 DIAGNOSIS — Z96652 Presence of left artificial knee joint: Secondary | ICD-10-CM | POA: Diagnosis not present

## 2014-03-21 DIAGNOSIS — F419 Anxiety disorder, unspecified: Secondary | ICD-10-CM | POA: Diagnosis not present

## 2014-03-21 DIAGNOSIS — Z471 Aftercare following joint replacement surgery: Secondary | ICD-10-CM | POA: Diagnosis not present

## 2014-03-21 DIAGNOSIS — F329 Major depressive disorder, single episode, unspecified: Secondary | ICD-10-CM | POA: Diagnosis not present

## 2014-03-21 DIAGNOSIS — E119 Type 2 diabetes mellitus without complications: Secondary | ICD-10-CM | POA: Diagnosis not present

## 2014-03-22 DIAGNOSIS — F329 Major depressive disorder, single episode, unspecified: Secondary | ICD-10-CM | POA: Diagnosis not present

## 2014-03-22 DIAGNOSIS — Z471 Aftercare following joint replacement surgery: Secondary | ICD-10-CM | POA: Diagnosis not present

## 2014-03-22 DIAGNOSIS — F419 Anxiety disorder, unspecified: Secondary | ICD-10-CM | POA: Diagnosis not present

## 2014-03-22 DIAGNOSIS — E119 Type 2 diabetes mellitus without complications: Secondary | ICD-10-CM | POA: Diagnosis not present

## 2014-03-22 DIAGNOSIS — Z96652 Presence of left artificial knee joint: Secondary | ICD-10-CM | POA: Diagnosis not present

## 2014-03-26 DIAGNOSIS — F419 Anxiety disorder, unspecified: Secondary | ICD-10-CM | POA: Diagnosis not present

## 2014-03-26 DIAGNOSIS — Z471 Aftercare following joint replacement surgery: Secondary | ICD-10-CM | POA: Diagnosis not present

## 2014-03-26 DIAGNOSIS — Z96652 Presence of left artificial knee joint: Secondary | ICD-10-CM | POA: Diagnosis not present

## 2014-03-26 DIAGNOSIS — E119 Type 2 diabetes mellitus without complications: Secondary | ICD-10-CM | POA: Diagnosis not present

## 2014-03-26 DIAGNOSIS — F329 Major depressive disorder, single episode, unspecified: Secondary | ICD-10-CM | POA: Diagnosis not present

## 2014-03-27 ENCOUNTER — Telehealth: Payer: Self-pay

## 2014-03-27 MED ORDER — DICLOFENAC SODIUM 75 MG PO TBEC: 75.0000 mg | DELAYED_RELEASE_TABLET | Freq: Two times a day (BID) | ORAL | Status: DC

## 2014-03-27 NOTE — Telephone Encounter (Signed)
Alisha Matthews called and requested that Dr Tamala Julian send in a Rx for just generic diclofenac alone, rather than the med that combines w/misoprostol. She reports that the combination med costs over $700 and she already takes Nexium to protect her stomach. She requests 90 day supplies for a year if possible to [facility] outpt. Pended for review.

## 2014-03-28 DIAGNOSIS — Z96652 Presence of left artificial knee joint: Secondary | ICD-10-CM | POA: Diagnosis not present

## 2014-03-28 DIAGNOSIS — F329 Major depressive disorder, single episode, unspecified: Secondary | ICD-10-CM | POA: Diagnosis not present

## 2014-03-28 DIAGNOSIS — Z471 Aftercare following joint replacement surgery: Secondary | ICD-10-CM | POA: Diagnosis not present

## 2014-03-28 DIAGNOSIS — E119 Type 2 diabetes mellitus without complications: Secondary | ICD-10-CM | POA: Diagnosis not present

## 2014-03-28 DIAGNOSIS — F419 Anxiety disorder, unspecified: Secondary | ICD-10-CM | POA: Diagnosis not present

## 2014-03-30 DIAGNOSIS — Z471 Aftercare following joint replacement surgery: Secondary | ICD-10-CM | POA: Diagnosis not present

## 2014-03-30 DIAGNOSIS — Z96652 Presence of left artificial knee joint: Secondary | ICD-10-CM | POA: Diagnosis not present

## 2014-03-30 DIAGNOSIS — F329 Major depressive disorder, single episode, unspecified: Secondary | ICD-10-CM | POA: Diagnosis not present

## 2014-03-30 DIAGNOSIS — E119 Type 2 diabetes mellitus without complications: Secondary | ICD-10-CM | POA: Diagnosis not present

## 2014-03-30 DIAGNOSIS — F419 Anxiety disorder, unspecified: Secondary | ICD-10-CM | POA: Diagnosis not present

## 2014-04-02 DIAGNOSIS — M25562 Pain in left knee: Secondary | ICD-10-CM | POA: Diagnosis not present

## 2014-04-02 DIAGNOSIS — M62552 Muscle wasting and atrophy, not elsewhere classified, left thigh: Secondary | ICD-10-CM | POA: Diagnosis not present

## 2014-04-02 DIAGNOSIS — R262 Difficulty in walking, not elsewhere classified: Secondary | ICD-10-CM | POA: Diagnosis not present

## 2014-04-02 DIAGNOSIS — Z96652 Presence of left artificial knee joint: Secondary | ICD-10-CM | POA: Diagnosis not present

## 2014-04-04 DIAGNOSIS — M62552 Muscle wasting and atrophy, not elsewhere classified, left thigh: Secondary | ICD-10-CM | POA: Diagnosis not present

## 2014-04-04 DIAGNOSIS — R262 Difficulty in walking, not elsewhere classified: Secondary | ICD-10-CM | POA: Diagnosis not present

## 2014-04-04 DIAGNOSIS — M25562 Pain in left knee: Secondary | ICD-10-CM | POA: Diagnosis not present

## 2014-04-04 DIAGNOSIS — Z96652 Presence of left artificial knee joint: Secondary | ICD-10-CM | POA: Diagnosis not present

## 2014-04-06 DIAGNOSIS — R262 Difficulty in walking, not elsewhere classified: Secondary | ICD-10-CM | POA: Diagnosis not present

## 2014-04-06 DIAGNOSIS — M62552 Muscle wasting and atrophy, not elsewhere classified, left thigh: Secondary | ICD-10-CM | POA: Diagnosis not present

## 2014-04-06 DIAGNOSIS — M25562 Pain in left knee: Secondary | ICD-10-CM | POA: Diagnosis not present

## 2014-04-06 DIAGNOSIS — Z96652 Presence of left artificial knee joint: Secondary | ICD-10-CM | POA: Diagnosis not present

## 2014-04-09 DIAGNOSIS — Z96652 Presence of left artificial knee joint: Secondary | ICD-10-CM | POA: Diagnosis not present

## 2014-04-09 DIAGNOSIS — M62552 Muscle wasting and atrophy, not elsewhere classified, left thigh: Secondary | ICD-10-CM | POA: Diagnosis not present

## 2014-04-09 DIAGNOSIS — M25562 Pain in left knee: Secondary | ICD-10-CM | POA: Diagnosis not present

## 2014-04-09 DIAGNOSIS — R262 Difficulty in walking, not elsewhere classified: Secondary | ICD-10-CM | POA: Diagnosis not present

## 2014-04-11 DIAGNOSIS — M62552 Muscle wasting and atrophy, not elsewhere classified, left thigh: Secondary | ICD-10-CM | POA: Diagnosis not present

## 2014-04-11 DIAGNOSIS — Z96652 Presence of left artificial knee joint: Secondary | ICD-10-CM | POA: Diagnosis not present

## 2014-04-11 DIAGNOSIS — M25562 Pain in left knee: Secondary | ICD-10-CM | POA: Diagnosis not present

## 2014-04-11 DIAGNOSIS — R262 Difficulty in walking, not elsewhere classified: Secondary | ICD-10-CM | POA: Diagnosis not present

## 2014-04-13 DIAGNOSIS — Z96652 Presence of left artificial knee joint: Secondary | ICD-10-CM | POA: Diagnosis not present

## 2014-04-13 DIAGNOSIS — M62552 Muscle wasting and atrophy, not elsewhere classified, left thigh: Secondary | ICD-10-CM | POA: Diagnosis not present

## 2014-04-13 DIAGNOSIS — R262 Difficulty in walking, not elsewhere classified: Secondary | ICD-10-CM | POA: Diagnosis not present

## 2014-04-13 DIAGNOSIS — M25562 Pain in left knee: Secondary | ICD-10-CM | POA: Diagnosis not present

## 2014-04-16 DIAGNOSIS — M25562 Pain in left knee: Secondary | ICD-10-CM | POA: Diagnosis not present

## 2014-04-16 DIAGNOSIS — Z96652 Presence of left artificial knee joint: Secondary | ICD-10-CM | POA: Diagnosis not present

## 2014-04-16 DIAGNOSIS — R262 Difficulty in walking, not elsewhere classified: Secondary | ICD-10-CM | POA: Diagnosis not present

## 2014-04-16 DIAGNOSIS — M62552 Muscle wasting and atrophy, not elsewhere classified, left thigh: Secondary | ICD-10-CM | POA: Diagnosis not present

## 2014-04-18 ENCOUNTER — Telehealth: Payer: Self-pay | Admitting: Obstetrics and Gynecology

## 2014-04-18 DIAGNOSIS — M25562 Pain in left knee: Secondary | ICD-10-CM | POA: Diagnosis not present

## 2014-04-18 DIAGNOSIS — M62552 Muscle wasting and atrophy, not elsewhere classified, left thigh: Secondary | ICD-10-CM | POA: Diagnosis not present

## 2014-04-18 DIAGNOSIS — Z96652 Presence of left artificial knee joint: Secondary | ICD-10-CM | POA: Diagnosis not present

## 2014-04-18 DIAGNOSIS — R262 Difficulty in walking, not elsewhere classified: Secondary | ICD-10-CM | POA: Diagnosis not present

## 2014-04-18 NOTE — Telephone Encounter (Signed)
Left message on vm regarding cancelled appt.

## 2014-04-19 DIAGNOSIS — M25562 Pain in left knee: Secondary | ICD-10-CM | POA: Diagnosis not present

## 2014-04-19 DIAGNOSIS — R262 Difficulty in walking, not elsewhere classified: Secondary | ICD-10-CM | POA: Diagnosis not present

## 2014-04-19 DIAGNOSIS — M62552 Muscle wasting and atrophy, not elsewhere classified, left thigh: Secondary | ICD-10-CM | POA: Diagnosis not present

## 2014-04-19 DIAGNOSIS — Z96652 Presence of left artificial knee joint: Secondary | ICD-10-CM | POA: Diagnosis not present

## 2014-04-23 ENCOUNTER — Encounter: Payer: Self-pay | Admitting: Family Medicine

## 2014-04-23 DIAGNOSIS — M25562 Pain in left knee: Secondary | ICD-10-CM | POA: Diagnosis not present

## 2014-04-23 DIAGNOSIS — M62552 Muscle wasting and atrophy, not elsewhere classified, left thigh: Secondary | ICD-10-CM | POA: Diagnosis not present

## 2014-04-23 DIAGNOSIS — Z96652 Presence of left artificial knee joint: Secondary | ICD-10-CM | POA: Diagnosis not present

## 2014-04-23 DIAGNOSIS — R262 Difficulty in walking, not elsewhere classified: Secondary | ICD-10-CM | POA: Diagnosis not present

## 2014-04-25 DIAGNOSIS — M62552 Muscle wasting and atrophy, not elsewhere classified, left thigh: Secondary | ICD-10-CM | POA: Diagnosis not present

## 2014-04-25 DIAGNOSIS — R262 Difficulty in walking, not elsewhere classified: Secondary | ICD-10-CM | POA: Diagnosis not present

## 2014-04-25 DIAGNOSIS — Z96652 Presence of left artificial knee joint: Secondary | ICD-10-CM | POA: Diagnosis not present

## 2014-04-25 DIAGNOSIS — M25562 Pain in left knee: Secondary | ICD-10-CM | POA: Diagnosis not present

## 2014-04-27 DIAGNOSIS — R262 Difficulty in walking, not elsewhere classified: Secondary | ICD-10-CM | POA: Diagnosis not present

## 2014-04-27 DIAGNOSIS — M25562 Pain in left knee: Secondary | ICD-10-CM | POA: Diagnosis not present

## 2014-04-27 DIAGNOSIS — M62552 Muscle wasting and atrophy, not elsewhere classified, left thigh: Secondary | ICD-10-CM | POA: Diagnosis not present

## 2014-04-27 DIAGNOSIS — Z96652 Presence of left artificial knee joint: Secondary | ICD-10-CM | POA: Diagnosis not present

## 2014-04-30 DIAGNOSIS — Z96652 Presence of left artificial knee joint: Secondary | ICD-10-CM | POA: Diagnosis not present

## 2014-04-30 DIAGNOSIS — R262 Difficulty in walking, not elsewhere classified: Secondary | ICD-10-CM | POA: Diagnosis not present

## 2014-04-30 DIAGNOSIS — M25562 Pain in left knee: Secondary | ICD-10-CM | POA: Diagnosis not present

## 2014-04-30 DIAGNOSIS — M62552 Muscle wasting and atrophy, not elsewhere classified, left thigh: Secondary | ICD-10-CM | POA: Diagnosis not present

## 2014-05-02 DIAGNOSIS — M25562 Pain in left knee: Secondary | ICD-10-CM | POA: Diagnosis not present

## 2014-05-02 DIAGNOSIS — M62552 Muscle wasting and atrophy, not elsewhere classified, left thigh: Secondary | ICD-10-CM | POA: Diagnosis not present

## 2014-05-02 DIAGNOSIS — R262 Difficulty in walking, not elsewhere classified: Secondary | ICD-10-CM | POA: Diagnosis not present

## 2014-05-02 DIAGNOSIS — Z96652 Presence of left artificial knee joint: Secondary | ICD-10-CM | POA: Diagnosis not present

## 2014-05-04 DIAGNOSIS — M25562 Pain in left knee: Secondary | ICD-10-CM | POA: Diagnosis not present

## 2014-05-04 DIAGNOSIS — R262 Difficulty in walking, not elsewhere classified: Secondary | ICD-10-CM | POA: Diagnosis not present

## 2014-05-04 DIAGNOSIS — M62552 Muscle wasting and atrophy, not elsewhere classified, left thigh: Secondary | ICD-10-CM | POA: Diagnosis not present

## 2014-05-04 DIAGNOSIS — Z96652 Presence of left artificial knee joint: Secondary | ICD-10-CM | POA: Diagnosis not present

## 2014-05-07 DIAGNOSIS — M25562 Pain in left knee: Secondary | ICD-10-CM | POA: Diagnosis not present

## 2014-05-07 DIAGNOSIS — M62552 Muscle wasting and atrophy, not elsewhere classified, left thigh: Secondary | ICD-10-CM | POA: Diagnosis not present

## 2014-05-07 DIAGNOSIS — Z96652 Presence of left artificial knee joint: Secondary | ICD-10-CM | POA: Diagnosis not present

## 2014-05-07 DIAGNOSIS — R262 Difficulty in walking, not elsewhere classified: Secondary | ICD-10-CM | POA: Diagnosis not present

## 2014-05-09 DIAGNOSIS — M25562 Pain in left knee: Secondary | ICD-10-CM | POA: Diagnosis not present

## 2014-05-09 DIAGNOSIS — Z96652 Presence of left artificial knee joint: Secondary | ICD-10-CM | POA: Diagnosis not present

## 2014-05-09 DIAGNOSIS — R262 Difficulty in walking, not elsewhere classified: Secondary | ICD-10-CM | POA: Diagnosis not present

## 2014-05-09 DIAGNOSIS — M62552 Muscle wasting and atrophy, not elsewhere classified, left thigh: Secondary | ICD-10-CM | POA: Diagnosis not present

## 2014-05-11 DIAGNOSIS — M25562 Pain in left knee: Secondary | ICD-10-CM | POA: Diagnosis not present

## 2014-05-11 DIAGNOSIS — M62552 Muscle wasting and atrophy, not elsewhere classified, left thigh: Secondary | ICD-10-CM | POA: Diagnosis not present

## 2014-05-11 DIAGNOSIS — Z96652 Presence of left artificial knee joint: Secondary | ICD-10-CM | POA: Diagnosis not present

## 2014-05-11 DIAGNOSIS — R262 Difficulty in walking, not elsewhere classified: Secondary | ICD-10-CM | POA: Diagnosis not present

## 2014-05-14 DIAGNOSIS — R262 Difficulty in walking, not elsewhere classified: Secondary | ICD-10-CM | POA: Diagnosis not present

## 2014-05-14 DIAGNOSIS — M25562 Pain in left knee: Secondary | ICD-10-CM | POA: Diagnosis not present

## 2014-05-14 DIAGNOSIS — M62552 Muscle wasting and atrophy, not elsewhere classified, left thigh: Secondary | ICD-10-CM | POA: Diagnosis not present

## 2014-05-14 DIAGNOSIS — Z96652 Presence of left artificial knee joint: Secondary | ICD-10-CM | POA: Diagnosis not present

## 2014-05-15 DIAGNOSIS — Z853 Personal history of malignant neoplasm of breast: Secondary | ICD-10-CM | POA: Diagnosis not present

## 2014-05-15 DIAGNOSIS — R921 Mammographic calcification found on diagnostic imaging of breast: Secondary | ICD-10-CM | POA: Diagnosis not present

## 2014-05-16 DIAGNOSIS — Z96652 Presence of left artificial knee joint: Secondary | ICD-10-CM | POA: Diagnosis not present

## 2014-05-16 DIAGNOSIS — R262 Difficulty in walking, not elsewhere classified: Secondary | ICD-10-CM | POA: Diagnosis not present

## 2014-05-16 DIAGNOSIS — M25562 Pain in left knee: Secondary | ICD-10-CM | POA: Diagnosis not present

## 2014-05-16 DIAGNOSIS — M62552 Muscle wasting and atrophy, not elsewhere classified, left thigh: Secondary | ICD-10-CM | POA: Diagnosis not present

## 2014-05-21 DIAGNOSIS — M25562 Pain in left knee: Secondary | ICD-10-CM | POA: Diagnosis not present

## 2014-05-21 DIAGNOSIS — Z96652 Presence of left artificial knee joint: Secondary | ICD-10-CM | POA: Diagnosis not present

## 2014-05-21 DIAGNOSIS — M62552 Muscle wasting and atrophy, not elsewhere classified, left thigh: Secondary | ICD-10-CM | POA: Diagnosis not present

## 2014-05-21 DIAGNOSIS — R262 Difficulty in walking, not elsewhere classified: Secondary | ICD-10-CM | POA: Diagnosis not present

## 2014-05-23 DIAGNOSIS — R262 Difficulty in walking, not elsewhere classified: Secondary | ICD-10-CM | POA: Diagnosis not present

## 2014-05-23 DIAGNOSIS — M25562 Pain in left knee: Secondary | ICD-10-CM | POA: Diagnosis not present

## 2014-05-23 DIAGNOSIS — M62552 Muscle wasting and atrophy, not elsewhere classified, left thigh: Secondary | ICD-10-CM | POA: Diagnosis not present

## 2014-05-23 DIAGNOSIS — Z96652 Presence of left artificial knee joint: Secondary | ICD-10-CM | POA: Diagnosis not present

## 2014-05-25 DIAGNOSIS — Z96652 Presence of left artificial knee joint: Secondary | ICD-10-CM | POA: Diagnosis not present

## 2014-05-25 DIAGNOSIS — R262 Difficulty in walking, not elsewhere classified: Secondary | ICD-10-CM | POA: Diagnosis not present

## 2014-05-25 DIAGNOSIS — M62552 Muscle wasting and atrophy, not elsewhere classified, left thigh: Secondary | ICD-10-CM | POA: Diagnosis not present

## 2014-05-25 DIAGNOSIS — M25562 Pain in left knee: Secondary | ICD-10-CM | POA: Diagnosis not present

## 2014-05-28 DIAGNOSIS — M25562 Pain in left knee: Secondary | ICD-10-CM | POA: Diagnosis not present

## 2014-05-28 DIAGNOSIS — M62552 Muscle wasting and atrophy, not elsewhere classified, left thigh: Secondary | ICD-10-CM | POA: Diagnosis not present

## 2014-05-28 DIAGNOSIS — R262 Difficulty in walking, not elsewhere classified: Secondary | ICD-10-CM | POA: Diagnosis not present

## 2014-05-28 DIAGNOSIS — Z96652 Presence of left artificial knee joint: Secondary | ICD-10-CM | POA: Diagnosis not present

## 2014-05-30 DIAGNOSIS — R262 Difficulty in walking, not elsewhere classified: Secondary | ICD-10-CM | POA: Diagnosis not present

## 2014-05-30 DIAGNOSIS — M25562 Pain in left knee: Secondary | ICD-10-CM | POA: Diagnosis not present

## 2014-05-30 DIAGNOSIS — Z96652 Presence of left artificial knee joint: Secondary | ICD-10-CM | POA: Diagnosis not present

## 2014-05-30 DIAGNOSIS — M62552 Muscle wasting and atrophy, not elsewhere classified, left thigh: Secondary | ICD-10-CM | POA: Diagnosis not present

## 2014-05-31 ENCOUNTER — Telehealth: Payer: Self-pay | Admitting: *Deleted

## 2014-05-31 NOTE — Telephone Encounter (Signed)
Mammogram was normal. LM with result on dedicated VM.

## 2014-06-01 DIAGNOSIS — M62552 Muscle wasting and atrophy, not elsewhere classified, left thigh: Secondary | ICD-10-CM | POA: Diagnosis not present

## 2014-06-01 DIAGNOSIS — Z96652 Presence of left artificial knee joint: Secondary | ICD-10-CM | POA: Diagnosis not present

## 2014-06-01 DIAGNOSIS — R262 Difficulty in walking, not elsewhere classified: Secondary | ICD-10-CM | POA: Diagnosis not present

## 2014-06-01 DIAGNOSIS — M25562 Pain in left knee: Secondary | ICD-10-CM | POA: Diagnosis not present

## 2014-06-04 DIAGNOSIS — M62552 Muscle wasting and atrophy, not elsewhere classified, left thigh: Secondary | ICD-10-CM | POA: Diagnosis not present

## 2014-06-04 DIAGNOSIS — R262 Difficulty in walking, not elsewhere classified: Secondary | ICD-10-CM | POA: Diagnosis not present

## 2014-06-04 DIAGNOSIS — M25562 Pain in left knee: Secondary | ICD-10-CM | POA: Diagnosis not present

## 2014-06-04 DIAGNOSIS — Z96652 Presence of left artificial knee joint: Secondary | ICD-10-CM | POA: Diagnosis not present

## 2014-06-06 ENCOUNTER — Other Ambulatory Visit: Payer: Medicare Other

## 2014-06-06 ENCOUNTER — Ambulatory Visit (HOSPITAL_BASED_OUTPATIENT_CLINIC_OR_DEPARTMENT_OTHER): Payer: Medicare Other | Admitting: Oncology

## 2014-06-06 ENCOUNTER — Telehealth: Payer: Self-pay | Admitting: Oncology

## 2014-06-06 VITALS — BP 127/53 | HR 68 | Temp 98.4°F | Resp 18 | Ht 63.0 in | Wt 161.4 lb

## 2014-06-06 DIAGNOSIS — M62552 Muscle wasting and atrophy, not elsewhere classified, left thigh: Secondary | ICD-10-CM | POA: Diagnosis not present

## 2014-06-06 DIAGNOSIS — R262 Difficulty in walking, not elsewhere classified: Secondary | ICD-10-CM | POA: Diagnosis not present

## 2014-06-06 DIAGNOSIS — M25562 Pain in left knee: Secondary | ICD-10-CM | POA: Diagnosis not present

## 2014-06-06 DIAGNOSIS — M8580 Other specified disorders of bone density and structure, unspecified site: Secondary | ICD-10-CM | POA: Insufficient documentation

## 2014-06-06 DIAGNOSIS — E119 Type 2 diabetes mellitus without complications: Secondary | ICD-10-CM

## 2014-06-06 DIAGNOSIS — Z853 Personal history of malignant neoplasm of breast: Secondary | ICD-10-CM

## 2014-06-06 DIAGNOSIS — C50211 Malignant neoplasm of upper-inner quadrant of right female breast: Secondary | ICD-10-CM

## 2014-06-06 DIAGNOSIS — Z96652 Presence of left artificial knee joint: Secondary | ICD-10-CM | POA: Diagnosis not present

## 2014-06-06 NOTE — Progress Notes (Signed)
Patient ID: Alisha Matthews, female   DOB: 26-Mar-1951, 63 y.o.   MRN: [id number] ID: Alisha Matthews   DOB: 29-Jun-1950  MR#: [id number]  LOV#:[id number]  PCP: Reginia Forts, MD GYN: Elyse Hsu SU: Rolm Bookbinder OTHER MD: Christene Slates, Lucio Edward, Melrose Nakayama  DIAGNOSIS: Right breast CA  CHIEF COMPLAINT: Estrogen receptor positive breast cancer  CURRENT TREATMENT: Observation  HISTORY OF PRESENT ILLNESS: As mentioned in previous note:   The patient had screening mammography at [facility] showing a worrisome area of calcifications.. I do not have a copy of that study or the following diagnostic mammography, but on 05/12/2012 the patient underwent right breast biopsy showing ([id number]) and invasive ductal carcinoma, E-cadherin positive, estrogen receptor 100% and progesterone receptor 22% positive, with an MIB-1 of 6%, and no amplification of HER-2.  On 05/20/2012 the patient underwent bilateral breast MRI, and this showed, on the right side, and 25 mm area of moderate enhancement, and on the left a 10 mm area of clumped linear enhancement. There was no abnormal lymphadenopathy noted. In the right lobe of the liver a 13 mm hyperintense lesion was incidentally noted.  On 05/24/2012 the patient underwent abdominal ultrasonography showing a complex cyst in the right liver lobe measuring 1.7 cm. There was no solid component. The liver was otherwise minimally inhomogeneous. On 05/27/2012 the patient underwent biopsy of this suspicious area on the left breast, and this turned out to be a fibroadenoma ([id number]).  Accordingly on 06/08/2012 the patient underwent left lumpectomy and sentinel lymph node sampling under [facility]. 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 Both sentinel lymph nodes were clear. Further surgery for margin clearance is planned prior to definitive radiation treatments.  The  patient's subsequent history is as detailed below.  INTERVAL HISTORY: Lache returns today for followup visit of her breast cancer. Since her last visit here she underwent her left knee replacement. She has been undergoing rehabilitation for this for about 5 months. She feels "the best of ever felt in my life".  REVIEW OF SYSTEMS: She has gained a few pounds, possibly because she is not exercising as much. She has changed her diet so she can eat more vegetables. Sometimes she has pain right at the lumpectomy site in the right breast, usually very brief shooting pains which are commonly experienced by patients in her situation. She is delighted with the way the knee is going. Her diabetes is under excellent control, she tells me. She most of the time "don't think of breast cancer at all and I don't see myself as a cancer patient". She was a close friend of my patient Fransisca Connors who did die from a lung cancer after having survived breast cancer. Jonia is planning to go hiking in a place near [place] that Mechele Claude told her about. Overall though a detailed review of systems today was noncontributory except as noted  PAST MEDICAL HISTORY: Past Medical History  Diagnosis Date  . Anxiety   . Arthritis   . Diabetes mellitus     borderline  . GERD (gastroesophageal reflux disease)   . Hyperlipidemia   . Insomnia, unspecified   . Leukocytopenia, unspecified   . Barrett's esophagus   . Breast fibrocystic disorder   . Genital warts   . Osteoarthrosis, unspecified whether generalized or localized, ankle and foot     ankle/foot  . Exercise induced bronchospasm   . Personal history of colonic polyps   .  Migraine, unspecified, without mention of intractable migraine without mention of status migrainosus   . Symptomatic menopausal or female climacteric states   . Obesity, unspecified   . Essential hypertension, benign   . Depressive disorder, not elsewhere classified   . Anemia, unspecified   . Chicken  pox   . Measles   . Mumps   . Breast cancer     right breast invasive ductal carcinoma   . Infected postoperative breast seroma   . Bronchitis   . Constipation, chronic     PAST SURGICAL HISTORY: Past Surgical History  Procedure Laterality Date  . Cholecystectomy    . Bunionectomy      3 surgeries on both feet  . Nasal septum surgery    . Total hip arthroplasty      left      Perthes disease  . Foot surgery      Left-revision  . Dilation and curettage of uterus  1978  . Breast lumpectomy with needle localization and axillary sentinel lymph node bx  06/08/2012    Procedure: BREAST LUMPECTOMY WITH NEEDLE LOCALIZATION AND AXILLARY SENTINEL LYMPH NODE BX;  Surgeon: Rolm Bookbinder, MD;  Location: [facility];  Service: General;  Laterality: Right;  . Re-excision of breast cancer,superior margins  06/27/2012    Procedure: RE-EXCISION OF BREAST CANCER,SUPERIOR MARGINS;  Surgeon: Rolm Bookbinder, MD;  Location: [facility];  Service: General;  Laterality: Right;  . Eye surgery      Orbital right eye surgery  . Colonoscopy    . Total knee arthroplasty Left 02/20/2014    Procedure: TOTAL KNEE ARTHROPLASTY;  Surgeon: Hessie Dibble, MD;  Location: [facility];  Service: Orthopedics;  Laterality: Left;    FAMILY HISTORY Family History  Problem Relation Age of Onset  . Adopted: Yes  . Hypertension Mother   . Diabetes Mother   . Heart failure Mother   . Cancer Maternal Grandmother 42    non hodgkins lymphoma   the patient knows little about her father, other than the fact that he died at the age of 14, causes unknown to her. The patient's mother died from complications of diabetes at the age of 37. The patient has 2 full and one half brother. She has no sisters. There is no history of breast or ovarian cancer in the family.  GYNECOLOGIC HISTORY: Menarche age 87. She is GX P0. Menopause approximately 2001. She never took hormone replacement.  SOCIAL  HISTORY: She has worked as a Merchandiser, retail and was involved with the [place] transplant program while I was there. Her husband Bronwen Betters Zeigler is a Conservation officer, historic buildings in a pain management clinic in [place]. Jesly has told me the story of his ascending aortic dissecting aneurysm and his miraculous recovery. At home with them are numerous cats. Also 1 dog.   ADVANCED DIRECTIVES: Niomi has a living will and her husband is her healthcare power of attorney.  HEALTH MAINTENANCE: History  Substance Use Topics  . Smoking status: Never Smoker   . Smokeless tobacco: Never Used  . Alcohol Use: No     Colonoscopy: 2013, under Lucio Edward  PAP: November 2012  Bone density: 2004, showing osteopenia  Lipid panel:  Allergies  Allergen Reactions  . Penicillins Rash    Current Outpatient Prescriptions  Medication Sig Dispense Refill  . albuterol (PROVENTIL HFA;VENTOLIN HFA) 108 (90 BASE) MCG/ACT inhaler Inhale 2 puffs into the lungs every 6 (six) hours as needed. Asthmatic attack 18  g 3  . ALPRAZolam (XANAX) 0.5 MG tablet Take 1 tablet (0.5 mg total) by mouth at bedtime as needed for anxiety. 30 tablet 5  . aspirin EC 325 MG EC tablet Take 1 tablet (325 mg total) by mouth 2 (two) times daily after a meal. 30 tablet 0  . atorvastatin (LIPITOR) 20 MG tablet Take 1 tablet (20 mg total) by mouth daily. 90 tablet 3  . Calcium-Magnesium-Vitamin D (CALCIUM 500 PO) Take 500 mg by mouth 3 (three) times daily.    . clindamycin (CLEOCIN) 150 MG capsule Take 600 mg by mouth as needed (dental work).     . Coenzyme Q10 (COQ10) 200 MG CAPS Take 200 mg by mouth daily.    . diclofenac (VOLTAREN) 75 MG EC tablet Take 1 tablet (75 mg total) by mouth 2 (two) times daily. 180 tablet 3  . Diindolylmethane POWD 1 Package by Does not apply route daily.    . diphenhydrAMINE (BENADRYL) 25 MG tablet Take 25 mg by mouth at bedtime as needed for sleep. When not taking xanax.    . docusate sodium (COLACE) 100 MG  capsule Take 100 mg by mouth 2 (two) times daily.    . frovatriptan (FROVA) 2.5 MG tablet Take 2.5 mg by mouth as needed for migraine (max 3 tabs in 24 hours). If recurs, may repeat after 2 hours. Max of 3 tabs in 24 hours.    Marland Kitchen HYDROcodone-acetaminophen (NORCO) 7.5-325 MG per tablet Take 1-2 tablets by mouth every 4 (four) hours as needed for moderate pain. 50 tablet 0  . LevOCARNitine (CARNITINE PO) Take 1 capsule by mouth daily.    Marland Kitchen lisinopril (PRINIVIL,ZESTRIL) 5 MG tablet Take 1 tablet (5 mg total) by mouth daily. 90 tablet 3  . metFORMIN (GLUCOPHAGE XR) 500 MG 24 hr tablet One bid 180 tablet 3  . methocarbamol (ROBAXIN) 500 MG tablet Take 1 tablet (500 mg total) by mouth every 6 (six) hours as needed for muscle spasms. 50 tablet 0  . Multiple Vitamins-Minerals (ANTIOXIDANT PO) Take 1 capsule by mouth daily. Fruit    . OVER THE COUNTER MEDICATION Take 1 capsule by mouth daily. Procast package: Elite-100 omega 3 vitamin    . OVER THE COUNTER MEDICATION Take 1 capsule by mouth daily. Procast: Memory,brain and liver vitamin    . OVER THE COUNTER MEDICATION Take 1 capsule by mouth daily. Circulation and vein support vitamin    . PARoxetine (PAXIL) 20 MG tablet Take 1 tablet (20 mg total) by mouth daily. 90 tablet 3  . PRESCRIPTION MEDICATION Percocet 5 mg  taking every 4-6 hours for pain    . Probiotic Product (PROBIOTIC DAILY PO) Take 1 capsule by mouth daily.    . simethicone (MYLICON) 80 MG chewable tablet Chew 80 mg by mouth every 6 (six) hours as needed for flatulence.     No current facility-administered medications for this visit.    OBJECTIVE: Middle-aged white woman in no acute distress Filed Vitals:   06/06/14 0936  BP: 127/53  Pulse: 68  Temp: 98.4 F (36.9 C)  Resp: 18     Body mass index is 28.6 kg/(m^2).    ECOG FS: 1  Sclerae unicteric, EOMs intact Oropharynx clear, teeth in good repair No cervical or supraclavicular adenopathy Lungs no rales or rhonchi Heart regular  rate and rhythm Abd soft, nontender, positive bowel sounds MSK no focal spinal tenderness, no upper extremity lymphedema; left knee surgery scar healing very nicely, without erythema or swelling Neuro: nonfocal,  well oriented, positive affect Breasts: The right breast is status post lumpectomy and radiation.  There is no evidence of local recurrence. The right axilla is benign. The left breast is unremarkable.  LAB RESULTS: Lab Results  Component Value Date   WBC 3.5* 03/19/2014   NEUTROABS 1.2* 02/20/2014   HGB 11.0* 03/19/2014   HCT 33.0* 03/19/2014   MCV 88.9 03/19/2014   PLT 115* 02/22/2014      Chemistry      Component Value Date/Time   NA 139 03/19/2014 0852   NA 139 02/06/2014 0926   NA 139 11/04/2011   K 4.0 03/19/2014 0852   K 4.2 02/06/2014 0926   CL 102 03/19/2014 0852   CL 102 06/23/2012 1618   CO2 27 03/19/2014 0852   CO2 28 02/06/2014 0926   BUN 9 03/19/2014 0852   BUN 11.1 02/06/2014 0926   BUN 6 11/04/2011   CREATININE 0.64 03/19/2014 0852   CREATININE 0.66 02/21/2014 0540   CREATININE 0.8 02/06/2014 0926   CREATININE 0.7 11/04/2011   GLU 101 11/04/2011      Component Value Date/Time   CALCIUM 9.5 03/19/2014 0852   CALCIUM 9.4 02/06/2014 0926   ALKPHOS 74 03/19/2014 0852   ALKPHOS 55 02/06/2014 0926   AST 23 03/19/2014 0852   AST 25 02/06/2014 0926   ALT 17 03/19/2014 0852   ALT 23 02/06/2014 0926   BILITOT 0.4 03/19/2014 0852   BILITOT 0.30 02/06/2014 0926       Lab Results  Component Value Date   LABCA2 23 06/03/2012    No components found for: [id number]  No results for input(s): INR in the last 168 hours.  Urinalysis    Component Value Date/Time   COLORURINE YELLOW 02/13/2014 1133    STUDIES: Mammography in November at [facility] was unremarkable per patient's report (written report requested).  ASSESSMENT: 63 y.o. [place] woman status post right breast biopsy December 2013 for a pT1a pN0, stage IA invasive ductal carcinoma, grade  2, estrogen receptor 100% positive, progesterone receptor 22% positive, with an MIB-1 of 6% and no HER-2 amplification  (1) completed adjuvant radiation 09/19/2012  (2) started tamoxifen in June 2014 but discontinued it after a month because of poor tolerance (varicosities)  (3) osteopenia  PLAN:  Lacheryl is doing fine as far as her breast cancer is concerned, and she has an excellent prognosis. I would be comfortable releasing her from follow-up at this point. We also discussed the survivorship clinic we are just beginning to develop.  After much discussion we decided she would see me one more time, a year from now, and that she would "graduate" at that point assuming there have been no new developments from a breast cancer point of view  Corrina is very comfortable with this plan. She will call with any problems that may develop before her next visit here. MAGRINAT,GUSTAV C     06/06/2014

## 2014-06-06 NOTE — Telephone Encounter (Signed)
, °

## 2014-06-08 DIAGNOSIS — M62552 Muscle wasting and atrophy, not elsewhere classified, left thigh: Secondary | ICD-10-CM | POA: Diagnosis not present

## 2014-06-08 DIAGNOSIS — R262 Difficulty in walking, not elsewhere classified: Secondary | ICD-10-CM | POA: Diagnosis not present

## 2014-06-08 DIAGNOSIS — M25562 Pain in left knee: Secondary | ICD-10-CM | POA: Diagnosis not present

## 2014-06-08 DIAGNOSIS — Z96652 Presence of left artificial knee joint: Secondary | ICD-10-CM | POA: Diagnosis not present

## 2014-06-08 NOTE — Addendum Note (Signed)
Addended by: Laureen Abrahams on: 06/08/2014 05:40 PM   Modules accepted: Orders, Medications

## 2014-06-11 ENCOUNTER — Other Ambulatory Visit: Payer: Self-pay

## 2014-06-11 DIAGNOSIS — R262 Difficulty in walking, not elsewhere classified: Secondary | ICD-10-CM | POA: Diagnosis not present

## 2014-06-11 DIAGNOSIS — M62552 Muscle wasting and atrophy, not elsewhere classified, left thigh: Secondary | ICD-10-CM | POA: Diagnosis not present

## 2014-06-11 DIAGNOSIS — Z96652 Presence of left artificial knee joint: Secondary | ICD-10-CM | POA: Diagnosis not present

## 2014-06-11 DIAGNOSIS — M25562 Pain in left knee: Secondary | ICD-10-CM | POA: Diagnosis not present

## 2014-06-11 NOTE — Telephone Encounter (Signed)
Stacey called and LM asking Dr Tamala Julian to send Rx for misoprostol 0.2 mg #90 to [facility]. She stated she could not afford to get both parts of the Arthrotec before and only got the voltaren, and would like to get the misoprostol now. Dr Tamala Julian please advise. Pended med but wasn't sure of sig or RFs. Don't see that you have Rxd this for pt in EPIC.

## 2014-06-12 ENCOUNTER — Encounter: Payer: Self-pay | Admitting: Family Medicine

## 2014-06-18 ENCOUNTER — Ambulatory Visit: Payer: Medicare Other | Admitting: Family Medicine

## 2014-06-18 DIAGNOSIS — R262 Difficulty in walking, not elsewhere classified: Secondary | ICD-10-CM | POA: Diagnosis not present

## 2014-06-18 DIAGNOSIS — M62552 Muscle wasting and atrophy, not elsewhere classified, left thigh: Secondary | ICD-10-CM | POA: Diagnosis not present

## 2014-06-18 DIAGNOSIS — M25562 Pain in left knee: Secondary | ICD-10-CM | POA: Diagnosis not present

## 2014-06-18 DIAGNOSIS — Z96652 Presence of left artificial knee joint: Secondary | ICD-10-CM | POA: Diagnosis not present

## 2014-06-21 DIAGNOSIS — M62552 Muscle wasting and atrophy, not elsewhere classified, left thigh: Secondary | ICD-10-CM | POA: Diagnosis not present

## 2014-06-21 DIAGNOSIS — M25562 Pain in left knee: Secondary | ICD-10-CM | POA: Diagnosis not present

## 2014-06-21 DIAGNOSIS — Z96652 Presence of left artificial knee joint: Secondary | ICD-10-CM | POA: Diagnosis not present

## 2014-06-21 DIAGNOSIS — R262 Difficulty in walking, not elsewhere classified: Secondary | ICD-10-CM | POA: Diagnosis not present

## 2014-06-25 DIAGNOSIS — R262 Difficulty in walking, not elsewhere classified: Secondary | ICD-10-CM | POA: Diagnosis not present

## 2014-06-25 DIAGNOSIS — Z96652 Presence of left artificial knee joint: Secondary | ICD-10-CM | POA: Diagnosis not present

## 2014-06-25 DIAGNOSIS — M62552 Muscle wasting and atrophy, not elsewhere classified, left thigh: Secondary | ICD-10-CM | POA: Diagnosis not present

## 2014-06-25 DIAGNOSIS — M25562 Pain in left knee: Secondary | ICD-10-CM | POA: Diagnosis not present

## 2014-06-28 DIAGNOSIS — R262 Difficulty in walking, not elsewhere classified: Secondary | ICD-10-CM | POA: Diagnosis not present

## 2014-06-28 DIAGNOSIS — M62552 Muscle wasting and atrophy, not elsewhere classified, left thigh: Secondary | ICD-10-CM | POA: Diagnosis not present

## 2014-06-28 DIAGNOSIS — M25562 Pain in left knee: Secondary | ICD-10-CM | POA: Diagnosis not present

## 2014-06-28 DIAGNOSIS — Z96652 Presence of left artificial knee joint: Secondary | ICD-10-CM | POA: Diagnosis not present

## 2014-07-02 DIAGNOSIS — Z96652 Presence of left artificial knee joint: Secondary | ICD-10-CM | POA: Diagnosis not present

## 2014-07-02 DIAGNOSIS — R262 Difficulty in walking, not elsewhere classified: Secondary | ICD-10-CM | POA: Diagnosis not present

## 2014-07-02 DIAGNOSIS — M62552 Muscle wasting and atrophy, not elsewhere classified, left thigh: Secondary | ICD-10-CM | POA: Diagnosis not present

## 2014-07-02 DIAGNOSIS — M25562 Pain in left knee: Secondary | ICD-10-CM | POA: Diagnosis not present

## 2014-07-06 DIAGNOSIS — Z96652 Presence of left artificial knee joint: Secondary | ICD-10-CM | POA: Diagnosis not present

## 2014-07-06 DIAGNOSIS — R262 Difficulty in walking, not elsewhere classified: Secondary | ICD-10-CM | POA: Diagnosis not present

## 2014-07-06 DIAGNOSIS — M62552 Muscle wasting and atrophy, not elsewhere classified, left thigh: Secondary | ICD-10-CM | POA: Diagnosis not present

## 2014-07-06 DIAGNOSIS — M25562 Pain in left knee: Secondary | ICD-10-CM | POA: Diagnosis not present

## 2014-07-09 ENCOUNTER — Ambulatory Visit (INDEPENDENT_AMBULATORY_CARE_PROVIDER_SITE_OTHER): Payer: Medicare Other | Admitting: Family Medicine

## 2014-07-09 ENCOUNTER — Encounter: Payer: Self-pay | Admitting: Family Medicine

## 2014-07-09 VITALS — BP 108/68 | HR 69 | Temp 98.9°F | Resp 16 | Ht 63.0 in

## 2014-07-09 DIAGNOSIS — D61818 Other pancytopenia: Secondary | ICD-10-CM

## 2014-07-09 DIAGNOSIS — E785 Hyperlipidemia, unspecified: Secondary | ICD-10-CM | POA: Diagnosis not present

## 2014-07-09 DIAGNOSIS — F32A Depression, unspecified: Secondary | ICD-10-CM

## 2014-07-09 DIAGNOSIS — E119 Type 2 diabetes mellitus without complications: Secondary | ICD-10-CM

## 2014-07-09 DIAGNOSIS — F419 Anxiety disorder, unspecified: Secondary | ICD-10-CM

## 2014-07-09 DIAGNOSIS — M1712 Unilateral primary osteoarthritis, left knee: Secondary | ICD-10-CM

## 2014-07-09 DIAGNOSIS — C50211 Malignant neoplasm of upper-inner quadrant of right female breast: Secondary | ICD-10-CM

## 2014-07-09 DIAGNOSIS — F418 Other specified anxiety disorders: Secondary | ICD-10-CM | POA: Diagnosis not present

## 2014-07-09 DIAGNOSIS — Z23 Encounter for immunization: Secondary | ICD-10-CM | POA: Diagnosis not present

## 2014-07-09 DIAGNOSIS — R262 Difficulty in walking, not elsewhere classified: Secondary | ICD-10-CM | POA: Diagnosis not present

## 2014-07-09 DIAGNOSIS — E7800 Pure hypercholesterolemia, unspecified: Secondary | ICD-10-CM

## 2014-07-09 DIAGNOSIS — M25562 Pain in left knee: Secondary | ICD-10-CM | POA: Diagnosis not present

## 2014-07-09 DIAGNOSIS — M62552 Muscle wasting and atrophy, not elsewhere classified, left thigh: Secondary | ICD-10-CM | POA: Diagnosis not present

## 2014-07-09 DIAGNOSIS — F329 Major depressive disorder, single episode, unspecified: Secondary | ICD-10-CM

## 2014-07-09 DIAGNOSIS — Z96652 Presence of left artificial knee joint: Secondary | ICD-10-CM | POA: Diagnosis not present

## 2014-07-09 LAB — COMPLETE METABOLIC PANEL WITH GFR
ALT: 21 U/L (ref 0–35)
AST: 24 U/L (ref 0–37)
Albumin: 4 g/dL (ref 3.5–5.2)
Alkaline Phosphatase: 71 U/L (ref 39–117)
BUN: 7 mg/dL (ref 6–23)
CALCIUM: 9.3 mg/dL (ref 8.4–10.5)
CHLORIDE: 105 meq/L (ref 96–112)
CO2: 26 mEq/L (ref 19–32)
CREATININE: 0.7 mg/dL (ref 0.50–1.10)
GFR, Est Non African American: >89 mL/min
Glucose, Bld: 95 mg/dL (ref 70–99)
Potassium: 4 mEq/L (ref 3.5–5.3)
Sodium: 140 mEq/L (ref 135–145)
TOTAL PROTEIN: 6.5 g/dL (ref 6.0–8.3)
Total Bilirubin: 0.5 mg/dL (ref 0.2–1.2)

## 2014-07-09 LAB — POCT GLYCOSYLATED HEMOGLOBIN (HGB A1C): Hemoglobin A1C: 5.9

## 2014-07-09 LAB — POCT CBC
GRANULOCYTE PERCENT: 51.4 % (ref 37–80)
HCT, POC: 36.7 % — AB (ref 37.7–47.9)
HEMOGLOBIN: 11.7 g/dL — AB (ref 12.2–16.2)
Lymph, poc: 1.4 (ref 0.6–3.4)
MCH: 27.5 pg (ref 27–31.2)
MCHC: 31.8 g/dL (ref 31.8–35.4)
MCV: 86.7 fL (ref 80–97)
MID (cbc): 0.2 (ref 0–0.9)
MPV: 6.9 fL (ref 0–99.8)
PLATELET COUNT, POC: 195 10*3/uL (ref 142–424)
POC Granulocyte: 1.7 — AB (ref 2–6.9)
POC LYMPH %: 41.8 % (ref 10–50)
POC MID %: 6.8 %M (ref 0–12)
RBC: 4.23 M/uL (ref 4.04–5.48)
RDW, POC: 15.8 %
WBC: 3.4 10*3/uL — AB (ref 4.6–10.2)

## 2014-07-09 LAB — LIPID PANEL
Cholesterol: 188 mg/dL (ref 0–200)
HDL: 73 mg/dL (ref >39)
LDL Cholesterol: 98 mg/dL (ref 0–99)
Total CHOL/HDL Ratio: 2.6 Ratio
Triglycerides: 83 mg/dL (ref <150)
VLDL: 17 mg/dL (ref 0–40)

## 2014-07-09 MED ORDER — MISOPROSTOL 200 MCG PO TABS: 200.0000 ug | ORAL_TABLET | Freq: Four times a day (QID) | ORAL | Status: DC

## 2014-07-09 NOTE — Progress Notes (Signed)
Subjective:    Patient ID: Alisha Matthews, female    DOB: [birth year], 64 y.o.   MRN: [id number]  HPI  This is a 64 year old female with is presenting for 3 month f/u.  1. L total knee replacement: surgery on 02/20/2014. Still in PT until march 1st. Going to PT two times a week now instead of 3. She has follow up appt with surgeon in March. She is taking voltaren-misoprostol QD-BID and occasional percocet. Pain is much improved. Not using cane as much. She has regular voltaren at home and wondering if she can get misoprostol to combine with the voltaren since the combination pill is very expensive.  2. DMII: last A1C 3 months ago was 5.6. She is on metformin 500 mg BID; has now decreased to once daily. Checks sugars once a month - usually 100-108. Last urine microalbumin 07/2012.  Last eye exam 2013; agreeable to scheduling on own.  3. Insomnia: She is sleeping through the night now since pain is improved, She takes xanax or benadryl QHS.   4. Anxiety and depression: takes paxil 20 mg daily. Her mood is better. Suffers with social anxiety.  Doing well.    5. Hyperlipidemia: last lipid panel 3 months ago showed good control with medication. Not having any myalgias or abdominal pain. Patient reports good compliance with medication, good tolerance to medication, and good symptom control.    6. Breast cancer: discharged from oncology since tumor was small and has had 2 normal mammograms. Last mammogram in November 2015 and normal.  She has had pneumococcal in past but never had prevnar. Requesting today.  Review of Systems  Constitutional: Negative for fever and chills.  Eyes: Negative for visual disturbance.  Respiratory: Negative for cough and shortness of breath.   Cardiovascular: Negative for chest pain and palpitations.  Gastrointestinal: Negative for nausea, vomiting, abdominal pain and diarrhea.  Endocrine: Negative for cold intolerance, heat intolerance, polydipsia, polyphagia and  polyuria.  Genitourinary: Negative for difficulty urinating.  Musculoskeletal: Positive for arthralgias.  Skin: Negative for color change, rash and wound.  Neurological: Negative for dizziness, tremors, seizures, syncope, facial asymmetry, speech difficulty, weakness, light-headedness, numbness and headaches.  Hematological: Negative for adenopathy.  Psychiatric/Behavioral: Positive for sleep disturbance. Negative for suicidal ideas, self-injury and dysphoric mood. The patient is not nervous/anxious.     Patient Active Problem List   Diagnosis Date Noted  . Osteopenia 06/06/2014  . Postoperative anemia due to acute blood loss 02/22/2014  . Left knee DJD 02/20/2014  . Other pancytopenia 05/01/2013  . Breast cancer of upper-inner quadrant of right female breast 02/21/2013  . Tinea capitis 10/31/2012  . Diabetes mellitus without complication 60/63/0160  . Anxiety and depression 08/08/2012  . Pure hypercholesterolemia 08/08/2012   Prior to Admission medications   Medication Sig Start Date End Date Taking? Authorizing Provider  albuterol (PROVENTIL HFA;VENTOLIN HFA) 108 (90 BASE) MCG/ACT inhaler Inhale 2 puffs into the lungs every 6 (six) hours as needed. Asthmatic attack 09/22/13  Yes Wardell Honour, MD  ALPRAZolam Duanne Moron) 0.5 MG tablet Take 1 tablet (0.5 mg total) by mouth at bedtime as needed for anxiety. 03/19/14  Yes Wardell Honour, MD  aspirin EC 325 MG EC tablet Take 1 tablet (325 mg total) by mouth 2 (two) times daily after a meal. 02/22/14  Yes Larwance Sachs Nida, PA-C  atorvastatin (LIPITOR) 20 MG tablet Take 1 tablet (20 mg total) by mouth daily. 03/19/14  Yes Wardell Honour, MD  Calcium-Magnesium-Vitamin D (  CALCIUM 500 PO) Take 500 mg by mouth 3 (three) times daily.   Yes Historical Provider, MD  clindamycin (CLEOCIN) 150 MG capsule Take 600 mg by mouth as needed (dental work).    Yes Historical Provider, MD  Coenzyme Q10 (COQ10) 200 MG CAPS Take 200 mg by mouth daily.   Yes Historical  Provider, MD  diclofenac (VOLTAREN) 75 MG EC tablet Take 1 tablet (75 mg total) by mouth 2 (two) times daily. 03/27/14  Yes Wardell Honour, MD  Diindolylmethane POWD 1 Package by Does not apply route daily.   Yes Historical Provider, MD  diphenhydrAMINE (BENADRYL) 25 MG tablet Take 25 mg by mouth at bedtime as needed for sleep. When not taking xanax.   Yes Historical Provider, MD  docusate sodium (COLACE) 100 MG capsule Take 100 mg by mouth 2 (two) times daily.   Yes Historical Provider, MD  frovatriptan (FROVA) 2.5 MG tablet Take 2.5 mg by mouth as needed for migraine (max 3 tabs in 24 hours). If recurs, may repeat after 2 hours. Max of 3 tabs in 24 hours.   Yes Historical Provider, MD  LevOCARNitine (CARNITINE PO) Take 1 capsule by mouth daily.   Yes Historical Provider, MD  lisinopril (PRINIVIL,ZESTRIL) 5 MG tablet Take 1 tablet (5 mg total) by mouth daily. 03/19/14  Yes Wardell Honour, MD  metFORMIN (GLUCOPHAGE XR) 500 MG 24 hr tablet One bid 03/19/14  Yes Wardell Honour, MD  methocarbamol (ROBAXIN) 500 MG tablet Take 1 tablet (500 mg total) by mouth every 6 (six) hours as needed for muscle spasms. 02/22/14  Yes Larwance Sachs Nida, PA-C  Multiple Vitamins-Minerals (ANTIOXIDANT PO) Take 1 capsule by mouth daily. Fruit   Yes Historical Provider, MD  OVER THE COUNTER MEDICATION Take 1 capsule by mouth daily. Procast package: Elite-100 omega 3 vitamin   Yes Historical Provider, MD  OVER THE COUNTER MEDICATION Take 1 capsule by mouth daily. Procast: Memory,brain and liver vitamin   Yes Historical Provider, MD  OVER THE COUNTER MEDICATION Take 1 capsule by mouth daily. Circulation and vein support vitamin   Yes Historical Provider, MD  PARoxetine (PAXIL) 20 MG tablet Take 1 tablet (20 mg total) by mouth daily. 03/19/14  Yes Wardell Honour, MD  PRESCRIPTION MEDICATION Percocet 5 mg  taking every 4-6 hours for pain   Yes Historical Provider, MD  Probiotic Product (PROBIOTIC DAILY PO) Take 1 capsule by mouth  daily.   Yes Historical Provider, MD  simethicone (MYLICON) 80 MG chewable tablet Chew 80 mg by mouth every 6 (six) hours as needed for flatulence.   Yes Historical Provider, MD    Allergies  Allergen Reactions  . Penicillins Rash   Patient's social and family history were reviewed.     Objective:   Physical Exam  Constitutional: She is oriented to person, place, and time. She appears well-developed and well-nourished. No distress.  HENT:  Head: Normocephalic and atraumatic.  Right Ear: Hearing and external ear normal.  Left Ear: Hearing and external ear normal.  Nose: Nose normal.  Mouth/Throat: Oropharynx is clear and moist.  Eyes: Conjunctivae, EOM and lids are normal. Pupils are equal, round, and reactive to light. Right eye exhibits no discharge. Left eye exhibits no discharge. No scleral icterus.  Neck: Normal range of motion. No thyromegaly present.  Cardiovascular: Normal rate, regular rhythm, normal heart sounds, intact distal pulses and normal pulses.   No murmur heard. Pulmonary/Chest: Effort normal and breath sounds normal. No respiratory distress. She has  no wheezes. She has no rhonchi. She has no rales.  Abdominal: Soft. Bowel sounds are normal. She exhibits no distension and no mass. There is no tenderness. There is no rebound and no guarding.  Musculoskeletal: Normal range of motion.       Left knee: She exhibits normal patellar mobility. No tenderness found. No medial joint line, no lateral joint line and no patellar tendon tenderness noted.  Well healed midline vertical incision/scar L knee.  Lymphadenopathy:    She has no cervical adenopathy.  Neurological: She is alert and oriented to person, place, and time. She has normal strength.  Skin: Skin is warm, dry and intact. No lesion and no rash noted. She is not diaphoretic.  Psychiatric: She has a normal mood and affect. Her speech is normal and behavior is normal. Thought content normal.   BP 108/68 mmHg  Pulse  69  Temp(Src) 98.9 F (37.2 C)  Resp 16  Ht 5\' 3"  (1.6 m)  Wt   SpO2 100%  LMP 06/22/2000  Results for orders placed or performed in visit on 07/09/14  POCT glycosylated hemoglobin (Hb A1C)  Result Value Ref Range   Hemoglobin A1C 5.9   POCT CBC  Result Value Ref Range   WBC 3.4 (A) 4.6 - 10.2 K/uL   Lymph, poc 1.4 0.6 - 3.4   POC LYMPH PERCENT 41.8 10 - 50 %L   MID (cbc) 0.2 0 - 0.9   POC MID % 6.8 0 - 12 %M   POC Granulocyte 1.7 (A) 2 - 6.9   Granulocyte percent 51.4 37 - 80 %G   RBC 4.23 4.04 - 5.48 M/uL   Hemoglobin 11.7 (A) 12.2 - 16.2 g/dL   HCT, POC 36.7 (A) 37.7 - 47.9 %   MCV 86.7 80 - 97 fL   MCH, POC 27.5 27 - 31.2 pg   MCHC 31.8 31.8 - 35.4 g/dL   RDW, POC 15.8 %   Platelet Count, POC 195 142 - 424 K/uL   MPV 6.9 0 - 99.8 fL   PREVNAR 13 ADMINISTERED.    Assessment & Plan:  1. Hyperlipidemia Stable on atorvastatin. Next lipid panel in 3 months.  - Lipid panel  2. Diabetes mellitus without complication Stable. A1C 5.9 today. Continue with metformin 500 mg QD-BID. - POCT glycosylated hemoglobin (Hb A1C) - POCT CBC - COMPLETE METABOLIC PANEL WITH GFR -microalbumin next visit. -Pt to schedule eye exam.  3. Other pancytopenia Stable today. Wbc 3.5, hgb 11.0, plts 176. 1 year ago wbc 3.4, hgb 11.7 and plts 195.  4. Anxiety and depression Stable on paxil.  5. Breast cancer of upper-inner quadrant of right female breast Discharged from oncology d/t 2 normal mammograms.   7. Primary osteoarthritis of left knee Continue with twice a week PT, follow up with surgeon in March 2016. Prescribed misoprostol 200 mcg to combine with voltaren 75 mg.  8. Need for vaccination with 13-polyvalent pneumococcal conjugate vaccine - Pneumococcal conjugate vaccine 13-valent IM   Norwood Levo, M.D. [facility] [address] [place], [place]  [address] [phone] phone [phone] fax   Benjaman Pott. Drenda Freeze, MHS [facility]  07/09/2014

## 2014-07-09 NOTE — Patient Instructions (Signed)
1. SCHEDULE DIABETIC EYE EXAM BEFORE NEXT VISIT.  Diabetes and Exercise Exercising regularly is important. It is not just about losing weight. It has many health benefits, such as:  Improving your overall fitness, flexibility, and endurance.  Increasing your bone density.  Helping with weight control.  Decreasing your body fat.  Increasing your muscle strength.  Reducing stress and tension.  Improving your overall health. People with diabetes who exercise gain additional benefits because exercise:  Reduces appetite.  Improves the body's use of blood sugar (glucose).  Helps lower or control blood glucose.  Decreases blood pressure.  Helps control blood lipids (such as cholesterol and triglycerides).  Improves the body's use of the hormone insulin by:  Increasing the body's insulin sensitivity.  Reducing the body's insulin needs.  Decreases the risk for heart disease because exercising:  Lowers cholesterol and triglycerides levels.  Increases the levels of good cholesterol (such as high-density lipoproteins [HDL]) in the body.  Lowers blood glucose levels. YOUR ACTIVITY PLAN  Choose an activity that you enjoy and set realistic goals. Your health care provider or diabetes educator can help you make an activity plan that works for you. Exercise regularly as directed by your health care provider. This includes:  Performing resistance training twice a week such as push-ups, sit-ups, lifting weights, or using resistance bands.  Performing 150 minutes of cardio exercises each week such as walking, running, or playing sports.  Staying active and spending no more than 90 minutes at one time being inactive. Even short bursts of exercise are good for you. Three 10-minute sessions spread throughout the day are just as beneficial as a single 30-minute session. Some exercise ideas include:  Taking the dog for a walk.  Taking the stairs instead of the elevator.  Dancing to  your favorite song.  Doing an exercise video.  Doing your favorite exercise with a friend. RECOMMENDATIONS FOR EXERCISING WITH TYPE 1 OR TYPE 2 DIABETES   Check your blood glucose before exercising. If blood glucose levels are greater than 240 mg/dL, check for urine ketones. Do not exercise if ketones are present.  Avoid injecting insulin into areas of the body that are going to be exercised. For example, avoid injecting insulin into:  The arms when playing tennis.  The legs when jogging.  Keep a record of:  Food intake before and after you exercise.  Expected peak times of insulin action.  Blood glucose levels before and after you exercise.  The type and amount of exercise you have done.  Review your records with your health care provider. Your health care provider will help you to develop guidelines for adjusting food intake and insulin amounts before and after exercising.  If you take insulin or oral hypoglycemic agents, watch for signs and symptoms of hypoglycemia. They include:  Dizziness.  Shaking.  Sweating.  Chills.  Confusion.  Drink plenty of water while you exercise to prevent dehydration or heat stroke. Body water is lost during exercise and must be replaced.  Talk to your health care provider before starting an exercise program to make sure it is safe for you. Remember, almost any type of activity is better than none. Document Released: 08/29/2003 Document Revised: 10/23/2013 Document Reviewed: 11/15/2012 [facility] Patient Information 2015 [place], [place]. This information is not intended to replace advice given to you by your health care provider. Make sure you discuss any questions you have with your health care provider.

## 2014-07-10 MED ORDER — MISOPROSTOL 200 MCG PO TABS: 200.0000 ug | ORAL_TABLET | Freq: Four times a day (QID) | ORAL | Status: DC

## 2014-07-12 DIAGNOSIS — M25562 Pain in left knee: Secondary | ICD-10-CM | POA: Diagnosis not present

## 2014-07-12 DIAGNOSIS — R262 Difficulty in walking, not elsewhere classified: Secondary | ICD-10-CM | POA: Diagnosis not present

## 2014-07-12 DIAGNOSIS — M62552 Muscle wasting and atrophy, not elsewhere classified, left thigh: Secondary | ICD-10-CM | POA: Diagnosis not present

## 2014-07-12 DIAGNOSIS — Z96652 Presence of left artificial knee joint: Secondary | ICD-10-CM | POA: Diagnosis not present

## 2014-07-16 DIAGNOSIS — M25562 Pain in left knee: Secondary | ICD-10-CM | POA: Diagnosis not present

## 2014-07-16 DIAGNOSIS — Z96652 Presence of left artificial knee joint: Secondary | ICD-10-CM | POA: Diagnosis not present

## 2014-07-16 DIAGNOSIS — R262 Difficulty in walking, not elsewhere classified: Secondary | ICD-10-CM | POA: Diagnosis not present

## 2014-07-16 DIAGNOSIS — M62552 Muscle wasting and atrophy, not elsewhere classified, left thigh: Secondary | ICD-10-CM | POA: Diagnosis not present

## 2014-07-19 DIAGNOSIS — R262 Difficulty in walking, not elsewhere classified: Secondary | ICD-10-CM | POA: Diagnosis not present

## 2014-07-19 DIAGNOSIS — M25562 Pain in left knee: Secondary | ICD-10-CM | POA: Diagnosis not present

## 2014-07-19 DIAGNOSIS — M62552 Muscle wasting and atrophy, not elsewhere classified, left thigh: Secondary | ICD-10-CM | POA: Diagnosis not present

## 2014-07-19 DIAGNOSIS — Z96652 Presence of left artificial knee joint: Secondary | ICD-10-CM | POA: Diagnosis not present

## 2014-07-23 DIAGNOSIS — R262 Difficulty in walking, not elsewhere classified: Secondary | ICD-10-CM | POA: Diagnosis not present

## 2014-07-23 DIAGNOSIS — M62552 Muscle wasting and atrophy, not elsewhere classified, left thigh: Secondary | ICD-10-CM | POA: Diagnosis not present

## 2014-07-23 DIAGNOSIS — M25562 Pain in left knee: Secondary | ICD-10-CM | POA: Diagnosis not present

## 2014-07-23 DIAGNOSIS — Z96652 Presence of left artificial knee joint: Secondary | ICD-10-CM | POA: Diagnosis not present

## 2014-07-26 DIAGNOSIS — R262 Difficulty in walking, not elsewhere classified: Secondary | ICD-10-CM | POA: Diagnosis not present

## 2014-07-26 DIAGNOSIS — Z96652 Presence of left artificial knee joint: Secondary | ICD-10-CM | POA: Diagnosis not present

## 2014-07-26 DIAGNOSIS — M62552 Muscle wasting and atrophy, not elsewhere classified, left thigh: Secondary | ICD-10-CM | POA: Diagnosis not present

## 2014-07-26 DIAGNOSIS — M25562 Pain in left knee: Secondary | ICD-10-CM | POA: Diagnosis not present

## 2014-07-30 DIAGNOSIS — M25562 Pain in left knee: Secondary | ICD-10-CM | POA: Diagnosis not present

## 2014-07-30 DIAGNOSIS — Z96652 Presence of left artificial knee joint: Secondary | ICD-10-CM | POA: Diagnosis not present

## 2014-07-30 DIAGNOSIS — R262 Difficulty in walking, not elsewhere classified: Secondary | ICD-10-CM | POA: Diagnosis not present

## 2014-07-30 DIAGNOSIS — M62552 Muscle wasting and atrophy, not elsewhere classified, left thigh: Secondary | ICD-10-CM | POA: Diagnosis not present

## 2014-08-02 DIAGNOSIS — Z96652 Presence of left artificial knee joint: Secondary | ICD-10-CM | POA: Diagnosis not present

## 2014-08-02 DIAGNOSIS — M62552 Muscle wasting and atrophy, not elsewhere classified, left thigh: Secondary | ICD-10-CM | POA: Diagnosis not present

## 2014-08-02 DIAGNOSIS — M25562 Pain in left knee: Secondary | ICD-10-CM | POA: Diagnosis not present

## 2014-08-02 DIAGNOSIS — R262 Difficulty in walking, not elsewhere classified: Secondary | ICD-10-CM | POA: Diagnosis not present

## 2014-08-06 DIAGNOSIS — Z96652 Presence of left artificial knee joint: Secondary | ICD-10-CM | POA: Diagnosis not present

## 2014-08-06 DIAGNOSIS — R262 Difficulty in walking, not elsewhere classified: Secondary | ICD-10-CM | POA: Diagnosis not present

## 2014-08-06 DIAGNOSIS — M25562 Pain in left knee: Secondary | ICD-10-CM | POA: Diagnosis not present

## 2014-08-06 DIAGNOSIS — M62552 Muscle wasting and atrophy, not elsewhere classified, left thigh: Secondary | ICD-10-CM | POA: Diagnosis not present

## 2014-08-09 DIAGNOSIS — R262 Difficulty in walking, not elsewhere classified: Secondary | ICD-10-CM | POA: Diagnosis not present

## 2014-08-09 DIAGNOSIS — M25562 Pain in left knee: Secondary | ICD-10-CM | POA: Diagnosis not present

## 2014-08-09 DIAGNOSIS — Z96652 Presence of left artificial knee joint: Secondary | ICD-10-CM | POA: Diagnosis not present

## 2014-08-09 DIAGNOSIS — M62552 Muscle wasting and atrophy, not elsewhere classified, left thigh: Secondary | ICD-10-CM | POA: Diagnosis not present

## 2014-08-13 DIAGNOSIS — Z96652 Presence of left artificial knee joint: Secondary | ICD-10-CM | POA: Diagnosis not present

## 2014-08-13 DIAGNOSIS — M62552 Muscle wasting and atrophy, not elsewhere classified, left thigh: Secondary | ICD-10-CM | POA: Diagnosis not present

## 2014-08-13 DIAGNOSIS — M25562 Pain in left knee: Secondary | ICD-10-CM | POA: Diagnosis not present

## 2014-08-13 DIAGNOSIS — R262 Difficulty in walking, not elsewhere classified: Secondary | ICD-10-CM | POA: Diagnosis not present

## 2014-08-16 DIAGNOSIS — R262 Difficulty in walking, not elsewhere classified: Secondary | ICD-10-CM | POA: Diagnosis not present

## 2014-08-16 DIAGNOSIS — M25562 Pain in left knee: Secondary | ICD-10-CM | POA: Diagnosis not present

## 2014-08-16 DIAGNOSIS — M62552 Muscle wasting and atrophy, not elsewhere classified, left thigh: Secondary | ICD-10-CM | POA: Diagnosis not present

## 2014-08-16 DIAGNOSIS — Z96652 Presence of left artificial knee joint: Secondary | ICD-10-CM | POA: Diagnosis not present

## 2014-08-20 DIAGNOSIS — R262 Difficulty in walking, not elsewhere classified: Secondary | ICD-10-CM | POA: Diagnosis not present

## 2014-08-20 DIAGNOSIS — M25562 Pain in left knee: Secondary | ICD-10-CM | POA: Diagnosis not present

## 2014-08-20 DIAGNOSIS — M62552 Muscle wasting and atrophy, not elsewhere classified, left thigh: Secondary | ICD-10-CM | POA: Diagnosis not present

## 2014-08-20 DIAGNOSIS — Z96652 Presence of left artificial knee joint: Secondary | ICD-10-CM | POA: Diagnosis not present

## 2014-08-23 DIAGNOSIS — M25562 Pain in left knee: Secondary | ICD-10-CM | POA: Diagnosis not present

## 2014-08-23 DIAGNOSIS — R262 Difficulty in walking, not elsewhere classified: Secondary | ICD-10-CM | POA: Diagnosis not present

## 2014-08-23 DIAGNOSIS — M62552 Muscle wasting and atrophy, not elsewhere classified, left thigh: Secondary | ICD-10-CM | POA: Diagnosis not present

## 2014-08-23 DIAGNOSIS — Z96652 Presence of left artificial knee joint: Secondary | ICD-10-CM | POA: Diagnosis not present

## 2014-08-27 DIAGNOSIS — R262 Difficulty in walking, not elsewhere classified: Secondary | ICD-10-CM | POA: Diagnosis not present

## 2014-08-27 DIAGNOSIS — M62552 Muscle wasting and atrophy, not elsewhere classified, left thigh: Secondary | ICD-10-CM | POA: Diagnosis not present

## 2014-08-27 DIAGNOSIS — M25562 Pain in left knee: Secondary | ICD-10-CM | POA: Diagnosis not present

## 2014-08-27 DIAGNOSIS — Z96652 Presence of left artificial knee joint: Secondary | ICD-10-CM | POA: Diagnosis not present

## 2014-08-30 DIAGNOSIS — M25562 Pain in left knee: Secondary | ICD-10-CM | POA: Diagnosis not present

## 2014-08-30 DIAGNOSIS — R262 Difficulty in walking, not elsewhere classified: Secondary | ICD-10-CM | POA: Diagnosis not present

## 2014-08-30 DIAGNOSIS — M62552 Muscle wasting and atrophy, not elsewhere classified, left thigh: Secondary | ICD-10-CM | POA: Diagnosis not present

## 2014-08-30 DIAGNOSIS — Z96652 Presence of left artificial knee joint: Secondary | ICD-10-CM | POA: Diagnosis not present

## 2014-09-03 DIAGNOSIS — M25562 Pain in left knee: Secondary | ICD-10-CM | POA: Diagnosis not present

## 2014-09-03 DIAGNOSIS — Z96652 Presence of left artificial knee joint: Secondary | ICD-10-CM | POA: Diagnosis not present

## 2014-09-03 DIAGNOSIS — M62552 Muscle wasting and atrophy, not elsewhere classified, left thigh: Secondary | ICD-10-CM | POA: Diagnosis not present

## 2014-09-03 DIAGNOSIS — R262 Difficulty in walking, not elsewhere classified: Secondary | ICD-10-CM | POA: Diagnosis not present

## 2014-09-06 DIAGNOSIS — E119 Type 2 diabetes mellitus without complications: Secondary | ICD-10-CM | POA: Diagnosis not present

## 2014-09-06 LAB — HM DIABETES EYE EXAM

## 2014-09-07 DIAGNOSIS — R262 Difficulty in walking, not elsewhere classified: Secondary | ICD-10-CM | POA: Diagnosis not present

## 2014-09-07 DIAGNOSIS — Z96652 Presence of left artificial knee joint: Secondary | ICD-10-CM | POA: Diagnosis not present

## 2014-09-07 DIAGNOSIS — M62552 Muscle wasting and atrophy, not elsewhere classified, left thigh: Secondary | ICD-10-CM | POA: Diagnosis not present

## 2014-09-07 DIAGNOSIS — M25562 Pain in left knee: Secondary | ICD-10-CM | POA: Diagnosis not present

## 2014-09-10 DIAGNOSIS — M25562 Pain in left knee: Secondary | ICD-10-CM | POA: Diagnosis not present

## 2014-09-10 DIAGNOSIS — M62552 Muscle wasting and atrophy, not elsewhere classified, left thigh: Secondary | ICD-10-CM | POA: Diagnosis not present

## 2014-09-10 DIAGNOSIS — R262 Difficulty in walking, not elsewhere classified: Secondary | ICD-10-CM | POA: Diagnosis not present

## 2014-09-10 DIAGNOSIS — Z96652 Presence of left artificial knee joint: Secondary | ICD-10-CM | POA: Diagnosis not present

## 2014-09-12 DIAGNOSIS — Z96652 Presence of left artificial knee joint: Secondary | ICD-10-CM | POA: Diagnosis not present

## 2014-10-12 ENCOUNTER — Ambulatory Visit: Payer: 59 | Admitting: Obstetrics and Gynecology

## 2014-10-12 ENCOUNTER — Ambulatory Visit: Payer: Medicare Other | Admitting: Obstetrics and Gynecology

## 2014-10-12 ENCOUNTER — Telehealth: Payer: Self-pay | Admitting: Obstetrics and Gynecology

## 2014-10-12 NOTE — Telephone Encounter (Signed)
Patient cancelled her appointment today because of insurance reasons.

## 2014-10-12 NOTE — Telephone Encounter (Signed)
She has Medicare and BCBS supplement.

## 2014-10-12 NOTE — Telephone Encounter (Signed)
Thank you for the update. Was this because she transitioned to Medicare?

## 2014-10-12 NOTE — Telephone Encounter (Signed)
Please note that patient cancelled her appointment due to insurance reasons.

## 2014-10-15 ENCOUNTER — Ambulatory Visit (INDEPENDENT_AMBULATORY_CARE_PROVIDER_SITE_OTHER): Payer: Medicare Other | Admitting: Family Medicine

## 2014-10-15 ENCOUNTER — Encounter: Payer: Self-pay | Admitting: Family Medicine

## 2014-10-15 ENCOUNTER — Encounter: Payer: Self-pay | Admitting: *Deleted

## 2014-10-15 VITALS — BP 122/77 | HR 65 | Temp 98.5°F | Resp 16 | Ht 62.75 in | Wt 157.6 lb

## 2014-10-15 DIAGNOSIS — F419 Anxiety disorder, unspecified: Secondary | ICD-10-CM

## 2014-10-15 DIAGNOSIS — E7800 Pure hypercholesterolemia, unspecified: Secondary | ICD-10-CM

## 2014-10-15 DIAGNOSIS — F341 Dysthymic disorder: Secondary | ICD-10-CM | POA: Diagnosis not present

## 2014-10-15 DIAGNOSIS — M8580 Other specified disorders of bone density and structure, unspecified site: Secondary | ICD-10-CM | POA: Diagnosis not present

## 2014-10-15 DIAGNOSIS — E119 Type 2 diabetes mellitus without complications: Secondary | ICD-10-CM

## 2014-10-15 DIAGNOSIS — Z01419 Encounter for gynecological examination (general) (routine) without abnormal findings: Secondary | ICD-10-CM | POA: Diagnosis not present

## 2014-10-15 DIAGNOSIS — F418 Other specified anxiety disorders: Secondary | ICD-10-CM | POA: Diagnosis not present

## 2014-10-15 DIAGNOSIS — C50211 Malignant neoplasm of upper-inner quadrant of right female breast: Secondary | ICD-10-CM | POA: Diagnosis not present

## 2014-10-15 DIAGNOSIS — Z0000 Encounter for general adult medical examination without abnormal findings: Secondary | ICD-10-CM

## 2014-10-15 DIAGNOSIS — F32A Depression, unspecified: Secondary | ICD-10-CM

## 2014-10-15 DIAGNOSIS — F329 Major depressive disorder, single episode, unspecified: Secondary | ICD-10-CM

## 2014-10-15 LAB — POCT URINALYSIS DIPSTICK
Bilirubin, UA: NEGATIVE
Blood, UA: NEGATIVE
Glucose, UA: NEGATIVE
Ketones, UA: NEGATIVE
LEUKOCYTES UA: NEGATIVE
NITRITE UA: NEGATIVE
Protein, UA: NEGATIVE
Spec Grav, UA: 1.01
Urobilinogen, UA: 0.2
pH, UA: 7

## 2014-10-15 LAB — COMPREHENSIVE METABOLIC PANEL
ALBUMIN: 4.3 g/dL (ref 3.5–5.2)
ALK PHOS: 68 U/L (ref 39–117)
ALT: 24 U/L (ref 0–35)
AST: 25 U/L (ref 0–37)
BILIRUBIN TOTAL: 0.4 mg/dL (ref 0.2–1.2)
BUN: 9 mg/dL (ref 6–23)
CHLORIDE: 102 meq/L (ref 96–112)
CO2: 26 mEq/L (ref 19–32)
Calcium: 9.5 mg/dL (ref 8.4–10.5)
Creat: 0.7 mg/dL (ref 0.50–1.10)
GLUCOSE: 96 mg/dL (ref 70–99)
Potassium: 4.2 mEq/L (ref 3.5–5.3)
Sodium: 137 mEq/L (ref 135–145)
Total Protein: 6.4 g/dL (ref 6.0–8.3)

## 2014-10-15 LAB — CBC WITH DIFFERENTIAL/PLATELET
Basophils Absolute: 0 10*3/uL (ref 0.0–0.1)
Basophils Relative: 1 % (ref 0–1)
EOS PCT: 3 % (ref 0–5)
Eosinophils Absolute: 0.1 10*3/uL (ref 0.0–0.7)
HEMATOCRIT: 36.2 % (ref 36.0–46.0)
HEMOGLOBIN: 11.8 g/dL — AB (ref 12.0–15.0)
LYMPHS ABS: 1.5 10*3/uL (ref 0.7–4.0)
LYMPHS PCT: 48 % — AB (ref 12–46)
MCH: 28.7 pg (ref 26.0–34.0)
MCHC: 32.6 g/dL (ref 30.0–36.0)
MCV: 88.1 fL (ref 78.0–100.0)
MONO ABS: 0.2 10*3/uL (ref 0.1–1.0)
MONOS PCT: 7 % (ref 3–12)
MPV: 9.1 fL (ref 8.6–12.4)
NEUTROS PCT: 41 % — AB (ref 43–77)
Neutro Abs: 1.3 10*3/uL — ABNORMAL LOW (ref 1.7–7.7)
PLATELETS: 200 10*3/uL (ref 150–400)
RBC: 4.11 MIL/uL (ref 3.87–5.11)
RDW: 13.7 % (ref 11.5–15.5)
WBC: 3.2 10*3/uL — AB (ref 4.0–10.5)

## 2014-10-15 LAB — LIPID PANEL
CHOLESTEROL: 234 mg/dL — AB (ref 0–200)
HDL: 92 mg/dL (ref >=46)
LDL CALC: 125 mg/dL — AB (ref 0–99)
Total CHOL/HDL Ratio: 2.5 Ratio
Triglycerides: 85 mg/dL (ref <150)
VLDL: 17 mg/dL (ref 0–40)

## 2014-10-15 LAB — MICROALBUMIN, URINE: Microalb, Ur: <0.2 mg/dL (ref <2.0)

## 2014-10-15 LAB — TSH: TSH: 2.81 u[IU]/mL (ref 0.350–4.500)

## 2014-10-15 LAB — HEMOGLOBIN A1C
Hgb A1c MFr Bld: 6.2 % — ABNORMAL HIGH (ref <5.7)
Mean Plasma Glucose: 131 mg/dL — ABNORMAL HIGH (ref <117)

## 2014-10-15 MED ORDER — ALPRAZOLAM 0.5 MG PO TABS: 0.5000 mg | ORAL_TABLET | Freq: Every evening | ORAL | Status: DC | PRN

## 2014-10-15 NOTE — Progress Notes (Signed)
Subjective:    Patient ID: Alisha Matthews, female    DOB: 1951/06/14, 64 y.o.   MRN: [id number]  HPI This 64 y.o. female presents for Welcome to Medicare Physical.  Last physical:  Not sure Pap smear:  2013; went to Dr. Harolyn Rutherford last Friday; requesting pap smear.  Medicare will not pay for screening pap smear per gynecology.  Established with [facility] due to Tamoxifen but did not continue taking.   Mammogram:  05-15-2014 Colonoscopy:  08-25-2011 Bone density:  Refuses; 1200mg  calcium daily.  Eats cheese and yogurt daily. TDAP:  2008 Pneumovax:  2007; Prevnar 13  06/2014 Zostavax: 2015 Influenza:  02-2014 Eye exam:  09-06-14  Dental exam:  10-08-14 [place].    DMII:  Patient reports good compliance with medication, good tolerance to medication, and good symptom control.  Completed diabetic eye exam 08-2014.  Sugars are running good at home. No n/t/w in legs.  Hyperlipidemia:  Patient reports good compliance with medication, good tolerance to medication, and good symptom control.   Denies HA/dizziness/focal weakness/paresthesias.   Anxiety and depression:Patient reports good compliance with medication, good tolerance to medication, and good symptom control.   Taking Xanax qod for insomnia; alternates with Benadryl.  Doing well emotionally.  Persistent anxiety but much improved since retired.  Husband to retire in upcoming eight months.   OA knees, hips:  Doing really well s/p L TKR.  Walking one mile without a cane.  Finished PT 09-10-14; five months of PT; had to work on hip and knee.  Able to garden.  Dalldorf follow-up 09-12-14; cleared.  Follow up annually.    Migraines:  Had first migraine in four years a few months ago.    Asthma:  Stable; no recent issues.   Review of Systems  Constitutional: Negative.  Negative for fever, chills, diaphoresis, activity change, appetite change, fatigue and unexpected weight change.  HENT: Positive for postnasal drip, rhinorrhea and sneezing. Negative for  congestion, dental problem, drooling, ear discharge, ear pain, facial swelling, hearing loss, mouth sores, nosebleeds, sinus pressure, sore throat, tinnitus, trouble swallowing and voice change.   Eyes: Negative.  Negative for photophobia, pain, discharge, redness, itching and visual disturbance.  Respiratory: Negative.  Negative for apnea, cough, choking, chest tightness, shortness of breath, wheezing and stridor.   Cardiovascular: Negative.  Negative for chest pain, palpitations and leg swelling.  Gastrointestinal: Negative.  Negative for nausea, vomiting, abdominal pain, diarrhea, constipation, blood in stool, abdominal distention, anal bleeding and rectal pain.  Endocrine: Negative.  Negative for cold intolerance, heat intolerance, polydipsia, polyphagia and polyuria.  Genitourinary: Negative.  Negative for dysuria, urgency, frequency, hematuria, flank pain, decreased urine volume, vaginal bleeding, vaginal discharge, enuresis, difficulty urinating, genital sores, vaginal pain, menstrual problem, pelvic pain and dyspareunia.  Musculoskeletal: Negative.  Negative for myalgias, back pain, joint swelling, arthralgias, gait problem, neck pain and neck stiffness.  Skin: Negative.  Negative for color change, pallor, rash and wound.  Allergic/Immunologic: Positive for environmental allergies. Negative for food allergies and immunocompromised state.  Neurological: Negative.  Negative for dizziness, tremors, seizures, syncope, facial asymmetry, speech difficulty, weakness, light-headedness, numbness and headaches.  Hematological: Negative.  Negative for adenopathy. Does not bruise/bleed easily.  Psychiatric/Behavioral: Negative.  Negative for suicidal ideas, hallucinations, behavioral problems, confusion, sleep disturbance, self-injury, dysphoric mood, decreased concentration and agitation. The patient is not nervous/anxious and is not hyperactive.        Objective:   Physical Exam  Constitutional: She  is oriented to person, place, and time. She  appears well-developed and well-nourished. No distress.  HENT:  Head: Normocephalic and atraumatic.  Right Ear: External ear normal.  Left Ear: External ear normal.  Nose: Nose normal.  Mouth/Throat: Oropharynx is clear and moist.  Eyes: Conjunctivae and EOM are normal. Pupils are equal, round, and reactive to light.  Neck: Normal range of motion and full passive range of motion without pain. Neck supple. No JVD present. Carotid bruit is not present. No thyromegaly present.  Cardiovascular: Normal rate, regular rhythm and normal heart sounds.  Exam reveals no gallop and no friction rub.   No murmur heard. Pulmonary/Chest: Effort normal and breath sounds normal. She has no wheezes. She has no rales. Right breast exhibits no inverted nipple, no mass, no nipple discharge, no skin change and no tenderness. Left breast exhibits no inverted nipple, no mass, no nipple discharge, no skin change and no tenderness. Breasts are asymmetrical.  Well healed surgical scars R breast.  Abdominal: Soft. Bowel sounds are normal. She exhibits no distension and no mass. There is no tenderness. There is no rebound and no guarding.  Genitourinary: Vagina normal and uterus normal. There is no rash, tenderness, lesion or injury on the right labia. There is no rash, tenderness, lesion or injury on the left labia. Cervix exhibits no motion tenderness, no discharge and no friability. Right adnexum displays no mass, no tenderness and no fullness. Left adnexum displays no mass, no tenderness and no fullness.  Atrophic cervix with closed os.  Musculoskeletal:       Right shoulder: Normal.       Left shoulder: Normal.       Cervical back: Normal.  Lymphadenopathy:    She has no cervical adenopathy.  Neurological: She is alert and oriented to person, place, and time. She has normal reflexes. No cranial nerve deficit. She exhibits normal muscle tone. Coordination normal.  Skin: Skin  is warm and dry. No rash noted. She is not diaphoretic. No erythema. No pallor.  Psychiatric: She has a normal mood and affect. Her behavior is normal. Judgment and thought content normal.  Nursing note and vitals reviewed.  EKG: NSR.    Assessment & Plan:   1. Initial Medicare annual wellness visit   2. Pure hypercholesterolemia   3. Osteopenia   4. Diabetes mellitus without complication   5. Anxiety and depression   6. Encounter for routine gynecological examination   7. Type 2 diabetes mellitus without complication     1. Welcome to Medicare Physical:  Anticipatory guidance --- exercise, weight loss, 3 servings of calcium daily, ASA 81mg  daily.  Pap smear obtained; mammogram and colonoscopy UTD.  Immunizations UTD.  Independent with ADLs.  Undergoing treatment for anxiety/depression.  Moderate fall risk due to OA.  No hearing loss.  Has advanced directives; copy requested for chart. 2.  Gynecological exam; pap smear obtained; mammogram UTD.  Post-menopausal. 3.  DMII: controlled; obtain labs.  Refill provided. 4.  Hypercholesterolemia: controlled; obtain labs; refill provided. 5.  ANxiety and depression:  Controlled; no changes to management; refills provided; encourage limited use of Xanax now that stressors have decreased.  6. OA knees: improved; doing well s/p TKR L.   7.  Migraines: stable. 8.  R breast cancer upper inner: stable; followed by oncology annually; refused Tamoxifen.  Meds ordered this encounter  Medications  . ALPRAZolam (XANAX) 0.5 MG tablet    Sig: Take 1 tablet (0.5 mg total) by mouth at bedtime as needed for anxiety.    Dispense:  30  tablet    Refill:  5  . albuterol (PROVENTIL HFA;VENTOLIN HFA) 108 (90 BASE) MCG/ACT inhaler    Sig: Inhale 2 puffs into the lungs every 6 (six) hours as needed. Asthmatic attack    Dispense:  18 g    Refill:  3  . atorvastatin (LIPITOR) 20 MG tablet    Sig: Take 1 tablet (20 mg total) by mouth daily.    Dispense:  90 tablet     Refill:  3  . diclofenac (VOLTAREN) 75 MG EC tablet    Sig: Take 1 tablet (75 mg total) by mouth 2 (two) times daily.    Dispense:  180 tablet    Refill:  3  . lisinopril (PRINIVIL,ZESTRIL) 5 MG tablet    Sig: Take 1 tablet (5 mg total) by mouth daily.    Dispense:  90 tablet    Refill:  3  . metFORMIN (GLUCOPHAGE XR) 500 MG 24 hr tablet    Sig: One bid    Dispense:  180 tablet    Refill:  3  . misoprostol (CYTOTEC) 200 MCG tablet    Sig: Take 1 tablet (200 mcg total) by mouth 4 (four) times daily.    Dispense:  120 tablet    Refill:  5  . PARoxetine (PAXIL) 20 MG tablet    Sig: Take 1 tablet (20 mg total) by mouth daily.    Dispense:  90 tablet    Refill:  3    Norwood Levo, M.D. [facility] [address] [place], [place]  [address] [phone] phone [phone] fax

## 2014-10-15 NOTE — Patient Instructions (Signed)

## 2014-10-16 LAB — PAP IG (IMAGE GUIDED)

## 2014-10-16 MED ORDER — ALBUTEROL SULFATE HFA 108 (90 BASE) MCG/ACT IN AERS: 2.0000 | INHALATION_SPRAY | Freq: Four times a day (QID) | RESPIRATORY_TRACT | Status: DC | PRN

## 2014-10-16 MED ORDER — METFORMIN HCL ER 500 MG PO TB24: ORAL_TABLET | ORAL | Status: DC

## 2014-10-16 MED ORDER — PAROXETINE HCL 20 MG PO TABS: 20.0000 mg | ORAL_TABLET | Freq: Every day | ORAL | Status: DC

## 2014-10-16 MED ORDER — MISOPROSTOL 200 MCG PO TABS: 200.0000 ug | ORAL_TABLET | Freq: Four times a day (QID) | ORAL | Status: DC

## 2014-10-16 MED ORDER — LISINOPRIL 5 MG PO TABS: 5.0000 mg | ORAL_TABLET | Freq: Every day | ORAL | Status: DC

## 2014-10-16 MED ORDER — DICLOFENAC SODIUM 75 MG PO TBEC: 75.0000 mg | DELAYED_RELEASE_TABLET | Freq: Two times a day (BID) | ORAL | Status: DC

## 2014-10-16 MED ORDER — ATORVASTATIN CALCIUM 20 MG PO TABS: 20.0000 mg | ORAL_TABLET | Freq: Every day | ORAL | Status: DC

## 2014-10-18 ENCOUNTER — Encounter: Payer: Self-pay | Admitting: Family Medicine

## 2015-01-16 ENCOUNTER — Ambulatory Visit (INDEPENDENT_AMBULATORY_CARE_PROVIDER_SITE_OTHER): Payer: Medicare Other | Admitting: Family Medicine

## 2015-01-16 ENCOUNTER — Encounter: Payer: Self-pay | Admitting: Family Medicine

## 2015-01-16 VITALS — BP 112/75 | HR 76 | Temp 97.2°F | Resp 16 | Ht 63.0 in | Wt 154.8 lb

## 2015-01-16 DIAGNOSIS — E119 Type 2 diabetes mellitus without complications: Secondary | ICD-10-CM

## 2015-01-16 DIAGNOSIS — F419 Anxiety disorder, unspecified: Secondary | ICD-10-CM

## 2015-01-16 DIAGNOSIS — K2100 Gastro-esophageal reflux disease with esophagitis, without bleeding: Secondary | ICD-10-CM

## 2015-01-16 DIAGNOSIS — E7800 Pure hypercholesterolemia, unspecified: Secondary | ICD-10-CM

## 2015-01-16 DIAGNOSIS — M1712 Unilateral primary osteoarthritis, left knee: Secondary | ICD-10-CM | POA: Diagnosis not present

## 2015-01-16 DIAGNOSIS — F418 Other specified anxiety disorders: Secondary | ICD-10-CM

## 2015-01-16 DIAGNOSIS — C50211 Malignant neoplasm of upper-inner quadrant of right female breast: Secondary | ICD-10-CM

## 2015-01-16 DIAGNOSIS — F32A Depression, unspecified: Secondary | ICD-10-CM

## 2015-01-16 DIAGNOSIS — F329 Major depressive disorder, single episode, unspecified: Secondary | ICD-10-CM

## 2015-01-16 LAB — CBC WITH DIFFERENTIAL/PLATELET
BASOS PCT: 1 % (ref 0–1)
Basophils Absolute: 0 10*3/uL (ref 0.0–0.1)
Eosinophils Absolute: 0.1 10*3/uL (ref 0.0–0.7)
Eosinophils Relative: 3 % (ref 0–5)
HCT: 35.7 % — ABNORMAL LOW (ref 36.0–46.0)
Hemoglobin: 11.7 g/dL — ABNORMAL LOW (ref 12.0–15.0)
LYMPHS ABS: 1.2 10*3/uL (ref 0.7–4.0)
Lymphocytes Relative: 40 % (ref 12–46)
MCH: 28.6 pg (ref 26.0–34.0)
MCHC: 32.8 g/dL (ref 30.0–36.0)
MCV: 87.3 fL (ref 78.0–100.0)
MONO ABS: 0.2 10*3/uL (ref 0.1–1.0)
MONOS PCT: 7 % (ref 3–12)
MPV: 9 fL (ref 8.6–12.4)
NEUTROS PCT: 49 % (ref 43–77)
Neutro Abs: 1.5 10*3/uL — ABNORMAL LOW (ref 1.7–7.7)
PLATELETS: 185 10*3/uL (ref 150–400)
RBC: 4.09 MIL/uL (ref 3.87–5.11)
RDW: 13.3 % (ref 11.5–15.5)
WBC: 3.1 10*3/uL — ABNORMAL LOW (ref 4.0–10.5)

## 2015-01-16 LAB — LIPID PANEL
CHOL/HDL RATIO: 2.3 ratio (ref <=5.0)
Cholesterol: 194 mg/dL (ref 125–200)
HDL: 83 mg/dL (ref >=46)
LDL Cholesterol: 90 mg/dL (ref <130)
TRIGLYCERIDES: 103 mg/dL (ref <150)
VLDL: 21 mg/dL (ref <30)

## 2015-01-16 LAB — COMPREHENSIVE METABOLIC PANEL
ALBUMIN: 4.3 g/dL (ref 3.6–5.1)
ALK PHOS: 61 U/L (ref 33–130)
ALT: 21 U/L (ref 6–29)
AST: 27 U/L (ref 10–35)
BILIRUBIN TOTAL: 0.4 mg/dL (ref 0.2–1.2)
BUN: 8 mg/dL (ref 7–25)
CALCIUM: 9.3 mg/dL (ref 8.6–10.4)
CHLORIDE: 102 meq/L (ref 98–110)
CO2: 28 meq/L (ref 20–31)
Creat: 0.7 mg/dL (ref 0.50–0.99)
Glucose, Bld: 100 mg/dL — ABNORMAL HIGH (ref 65–99)
POTASSIUM: 4.2 meq/L (ref 3.5–5.3)
SODIUM: 137 meq/L (ref 135–146)
Total Protein: 6.6 g/dL (ref 6.1–8.1)

## 2015-01-16 LAB — POCT GLYCOSYLATED HEMOGLOBIN (HGB A1C): HEMOGLOBIN A1C: 6

## 2015-01-16 MED ORDER — ESOMEPRAZOLE MAGNESIUM 40 MG PO CPDR: 40.0000 mg | DELAYED_RELEASE_CAPSULE | Freq: Every day | ORAL | Status: DC

## 2015-01-16 NOTE — Progress Notes (Signed)
Subjective:    Patient ID: Alisha Matthews, female    DOB: 01/01/1951, 64 y.o.   MRN: [id number]  01/16/2015  Diabetes; Anxiety; Depression; and hypercholesterimia   HPI This 64 y.o. female presents for three month follow-up:  1. DMII: Patient reports good compliance with medication, good tolerance to medication, and good symptom control.  Increasing daily to bid alternating since last visit due to HgbA1c of 6.2.  Not checking sugars. Urine microalbumin obtained at last visit 09/2014.   2. Hyperlipidemia:  Patient reports good compliance with medication, good tolerance to medication, and good symptom control.  Daily Lipitor.  LDL 125 at last visit.   3.  Anxiety and depression: Patient reports good compliance with medication, good tolerance to medication, and good symptom control.  Considering returning back to work.  Really misses patient care.  Now much more healthy.  Has cleaned house over and over.  Getting together with friends but most people are working.  Misses fellowship.  Had a rough few years with death of parents, almost death of husband.  Feels rejuvenated. Still taking Xanax qhs; uses Benadryl on weekends.  Suffers with social phobia.     4.  OA L knee:  S/p L TKR; s/p rehab/PT.  No limitations now.  Doing really well. Can do anything pt wants. Really was suffering with a lot of daily pain.     5.  GERD: refused refill of Nexium; Had not refilled it for two years; previously prescribed 40mg  bid; stock piled Nexium; ran out; has been using OTC.  Needs paper script; has [place].  Review of Systems  Constitutional: Negative for fever, chills, diaphoresis and fatigue.  Eyes: Negative for visual disturbance.  Respiratory: Negative for cough and shortness of breath.   Cardiovascular: Negative for chest pain, palpitations and leg swelling.  Gastrointestinal: Negative for nausea, vomiting, abdominal pain, diarrhea and constipation.  Endocrine: Negative for cold intolerance, heat  intolerance, polydipsia, polyphagia and polyuria.  Neurological: Negative for dizziness, tremors, seizures, syncope, facial asymmetry, speech difficulty, weakness, light-headedness, numbness and headaches.  Psychiatric/Behavioral: Positive for sleep disturbance. Negative for suicidal ideas, self-injury and dysphoric mood. The patient is nervous/anxious.     Past Medical History  Diagnosis Date  . Anxiety   . Arthritis   . Diabetes mellitus     borderline  . GERD (gastroesophageal reflux disease)   . Hyperlipidemia   . Insomnia, unspecified   . Leukocytopenia, unspecified   . Barrett's esophagus   . Breast fibrocystic disorder   . Genital warts   . Osteoarthrosis, unspecified whether generalized or localized, ankle and foot     ankle/foot  . Exercise induced bronchospasm   . Personal history of colonic polyps   . Migraine, unspecified, without mention of intractable migraine without mention of status migrainosus   . Symptomatic menopausal or female climacteric states   . Obesity, unspecified   . Essential hypertension, benign   . Depressive disorder, not elsewhere classified   . Anemia, unspecified   . Chicken pox   . Measles   . Mumps   . Breast cancer     right breast invasive ductal carcinoma   . Infected postoperative breast seroma   . Bronchitis   . Constipation, chronic    Past Surgical History  Procedure Laterality Date  . Cholecystectomy    . Bunionectomy      3 surgeries on both feet  . Nasal septum surgery    . Total hip arthroplasty  left      Perthes disease  . Foot surgery      Left-revision  . Dilation and curettage of uterus  1978  . Breast lumpectomy with needle localization and axillary sentinel lymph node bx  06/08/2012    Procedure: BREAST LUMPECTOMY WITH NEEDLE LOCALIZATION AND AXILLARY SENTINEL LYMPH NODE BX;  Surgeon: Rolm Bookbinder, MD;  Location: [facility];  Service: General;  Laterality: Right;  . Re-excision of breast  cancer,superior margins  06/27/2012    Procedure: RE-EXCISION OF BREAST CANCER,SUPERIOR MARGINS;  Surgeon: Rolm Bookbinder, MD;  Location: [facility];  Service: General;  Laterality: Right;  . Eye surgery      Orbital right eye surgery  . Colonoscopy    . Total knee arthroplasty Left 02/20/2014    Procedure: TOTAL KNEE ARTHROPLASTY;  Surgeon: Hessie Dibble, MD;  Location: [facility];  Service: Orthopedics;  Laterality: Left;   Allergies  Allergen Reactions  . Penicillins Rash   Current Outpatient Prescriptions  Medication Sig Dispense Refill  . albuterol (PROVENTIL HFA;VENTOLIN HFA) 108 (90 BASE) MCG/ACT inhaler Inhale 2 puffs into the lungs every 6 (six) hours as needed. Asthmatic attack 18 g 3  . ALPRAZolam (XANAX) 0.5 MG tablet Take 1 tablet (0.5 mg total) by mouth at bedtime as needed for anxiety. 30 tablet 5  . aspirin EC 325 MG EC tablet Take 1 tablet (325 mg total) by mouth 2 (two) times daily after a meal. 30 tablet 0  . atorvastatin (LIPITOR) 20 MG tablet Take 1 tablet (20 mg total) by mouth daily. 90 tablet 3  . Calcium-Magnesium-Vitamin D (CALCIUM 500 PO) Take 500 mg by mouth 3 (three) times daily.    . clindamycin (CLEOCIN) 150 MG capsule Take 600 mg by mouth as needed (dental work).     . Coenzyme Q10 (COQ10) 200 MG CAPS Take 200 mg by mouth daily.    . diclofenac (VOLTAREN) 75 MG EC tablet Take 1 tablet (75 mg total) by mouth 2 (two) times daily. 180 tablet 3  . Diindolylmethane POWD 1 Package by Does not apply route daily.    . diphenhydrAMINE (BENADRYL) 25 MG tablet Take 25 mg by mouth at bedtime as needed for sleep. When not taking xanax.    . docusate sodium (COLACE) 100 MG capsule Take 100 mg by mouth 2 (two) times daily.    . frovatriptan (FROVA) 2.5 MG tablet Take 2.5 mg by mouth as needed for migraine (max 3 tabs in 24 hours). If recurs, may repeat after 2 hours. Max of 3 tabs in 24 hours.    . LevOCARNitine (CARNITINE PO) Take 1 capsule by mouth daily.      Marland Kitchen lisinopril (PRINIVIL,ZESTRIL) 5 MG tablet Take 1 tablet (5 mg total) by mouth daily. 90 tablet 3  . metFORMIN (GLUCOPHAGE XR) 500 MG 24 hr tablet One bid 180 tablet 3  . methocarbamol (ROBAXIN) 500 MG tablet Take 1 tablet (500 mg total) by mouth every 6 (six) hours as needed for muscle spasms. 50 tablet 0  . misoprostol (CYTOTEC) 200 MCG tablet Take 1 tablet (200 mcg total) by mouth 4 (four) times daily. 120 tablet 5  . Multiple Vitamins-Minerals (ANTIOXIDANT PO) Take 1 capsule by mouth daily. Fruit    . OVER THE COUNTER MEDICATION Take 1 capsule by mouth daily. Procast package: Elite-100 omega 3 vitamin    . OVER THE COUNTER MEDICATION Take 1 capsule by mouth daily. Procast: Memory,brain and liver vitamin    .  OVER THE COUNTER MEDICATION Take 1 capsule by mouth daily. Circulation and vein support vitamin    . PARoxetine (PAXIL) 20 MG tablet Take 1 tablet (20 mg total) by mouth daily. 90 tablet 3  . PRESCRIPTION MEDICATION Percocet 5 mg  taking every 4-6 hours for pain    . Probiotic Product (PROBIOTIC DAILY PO) Take 1 capsule by mouth daily.    . simethicone (MYLICON) 80 MG chewable tablet Chew 80 mg by mouth every 6 (six) hours as needed for flatulence.    Marland Kitchen esomeprazole (NEXIUM) 40 MG capsule Take 1 capsule (40 mg total) by mouth daily. 90 capsule 3   No current facility-administered medications for this visit.   History   Social History  . Marital Status: Married    Spouse Name: N/A  . Number of Children: 0  . Years of Education: college   Occupational History  . unemployed [employer]    previous [employer] at [employer]  . Smoking status: Never Smoker   . Smokeless tobacco: Never Used  . Alcohol Use: No  . Drug Use: No  . Sexual Activity: No     Comment: post menopausal   Other Topics Concern  . Not on file   Social History Narrative   Marital status:  Married x 18 years happily, second marriage. No domestic abuse; 5 cats; no children.      Lives:  with husband, 5 cats.      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 Walking 1-1.80miles per day      Caffeine: + Moderate caffeine use.          Seatbelt: 100%      Guns:  Unloaded gun.      Advanced Directives:  Living Will --- FULL CODE but no prolonged measures.     Family History  Problem Relation Age of Onset  . Adopted: Yes  . Hypertension Mother   . Diabetes Mother   . Heart failure Mother   . Cancer Maternal Grandmother 62    non hodgkins lymphoma        Objective:    BP 112/75 mmHg  Pulse 76  Temp(Src) 97.2 F (36.2 C) (Oral)  Resp 16  Ht 5\' 3"  (1.6 m)  Wt 154 lb 12.8 oz (70.217 kg)  BMI 27.43 kg/m2  SpO2 98%  LMP 06/22/2000 Physical Exam  Constitutional: She is oriented to person, place, and time. She appears well-developed and well-nourished. No distress.  HENT:  Head: Normocephalic and atraumatic.  Right Ear: External ear normal.  Left Ear: External ear normal.  Nose: Nose normal.  Mouth/Throat: Oropharynx is clear and moist.  Eyes: Conjunctivae and EOM are normal. Pupils are equal, round, and reactive to light.  Neck: Normal range of motion. Neck supple. Carotid bruit is not present. No thyromegaly present.  Cardiovascular: Normal rate, regular rhythm, normal heart sounds and intact distal pulses.  Exam reveals no gallop and no friction rub.   No murmur heard. Pulmonary/Chest: Effort normal and breath sounds normal. She has no wheezes. She has no rales.  Abdominal: Soft. Bowel sounds are normal. She exhibits no distension and no mass. There is no tenderness. There is no  rebound and no guarding.  Lymphadenopathy:    She has no cervical adenopathy.  Neurological: She is alert and oriented to person, place, and time. No cranial nerve deficit.  Skin: Skin is warm and dry. No rash noted. She is  not diaphoretic. No erythema. No pallor.  Psychiatric: She has a normal mood and affect. Her behavior is normal.   Results for orders placed or performed in visit on 01/16/15  POCT glycosylated hemoglobin (Hb A1C)  Result Value Ref Range   Hemoglobin A1C 6.0        Assessment & Plan:   1. Pure hypercholesterolemia   2. Diabetes mellitus without complication   3. Breast cancer of upper-inner quadrant of right female breast   4. Anxiety and depression   5. Gastroesophageal reflux disease with esophagitis   6. Primary osteoarthritis of left knee     1. Hypercholesterolemia: uncontrolled at last visit; repeat labs today; improved compliance with medications.   2.  DMII without complications: controlled; obtain labs; continue current medications. 3.  Breast cancer R: stable; followed by oncology. 4.  Anxiety and depression: improved greatly in past year; continue Paxil; encouraged decreased use of Xanax qhs to 1/2 tablet. 5.  GERD: controlled; refill of Nexium provided; using PRN at this time; last rx fill 2 years ago. 6. OA L knee: improved; s/p TKR; pain free now.  Considering returning to work.   Meds ordered this encounter  Medications  . DISCONTD: esomeprazole (NEXIUM) 40 MG capsule    Sig: Take 40 mg by mouth daily at 12 noon.  Marland Kitchen esomeprazole (NEXIUM) 40 MG capsule    Sig: Take 1 capsule (40 mg total) by mouth daily.    Dispense:  90 capsule    Refill:  3    Return in about 3 months (around 04/18/2015) for recheck.     Kristi Elayne Guerin, M.D. [facility] [address] [place], [place]  [address] [phone] phone [phone] fax

## 2015-01-16 NOTE — Patient Instructions (Signed)
Diabetes and Exercise Exercising regularly is important. It is not just about losing weight. It has many health benefits, such as:  Improving your overall fitness, flexibility, and endurance.  Increasing your bone density.  Helping with weight control.  Decreasing your body fat.  Increasing your muscle strength.  Reducing stress and tension.  Improving your overall health. People with diabetes who exercise gain additional benefits because exercise:  Reduces appetite.  Improves the body's use of blood sugar (glucose).  Helps lower or control blood glucose.  Decreases blood pressure.  Helps control blood lipids (such as cholesterol and triglycerides).  Improves the body's use of the hormone insulin by:  Increasing the body's insulin sensitivity.  Reducing the body's insulin needs.  Decreases the risk for heart disease because exercising:  Lowers cholesterol and triglycerides levels.  Increases the levels of good cholesterol (such as high-density lipoproteins [HDL]) in the body.  Lowers blood glucose levels. YOUR ACTIVITY PLAN  Choose an activity that you enjoy and set realistic goals. Your health care provider or diabetes educator can help you make an activity plan that works for you. Exercise regularly as directed by your health care provider. This includes:  Performing resistance training twice a week such as push-ups, sit-ups, lifting weights, or using resistance bands.  Performing 150 minutes of cardio exercises each week such as walking, running, or playing sports.  Staying active and spending no more than 90 minutes at one time being inactive. Even short bursts of exercise are good for you. Three 10-minute sessions spread throughout the day are just as beneficial as a single 30-minute session. Some exercise ideas include:  Taking the dog for a walk.  Taking the stairs instead of the elevator.  Dancing to your favorite song.  Doing an exercise  video.  Doing your favorite exercise with a friend. RECOMMENDATIONS FOR EXERCISING WITH TYPE 1 OR TYPE 2 DIABETES   Check your blood glucose before exercising. If blood glucose levels are greater than 240 mg/dL, check for urine ketones. Do not exercise if ketones are present.  Avoid injecting insulin into areas of the body that are going to be exercised. For example, avoid injecting insulin into:  The arms when playing tennis.  The legs when jogging.  Keep a record of:  Food intake before and after you exercise.  Expected peak times of insulin action.  Blood glucose levels before and after you exercise.  The type and amount of exercise you have done.  Review your records with your health care provider. Your health care provider will help you to develop guidelines for adjusting food intake and insulin amounts before and after exercising.  If you take insulin or oral hypoglycemic agents, watch for signs and symptoms of hypoglycemia. They include:  Dizziness.  Shaking.  Sweating.  Chills.  Confusion.  Drink plenty of water while you exercise to prevent dehydration or heat stroke. Body water is lost during exercise and must be replaced.  Talk to your health care provider before starting an exercise program to make sure it is safe for you. Remember, almost any type of activity is better than none. Document Released: 08/29/2003 Document Revised: 10/23/2013 Document Reviewed: 11/15/2012 ExitCare Patient Information 2015 [employer]. This information is not intended to replace advice given to you by your health care provider. Make sure you discuss any questions you have with your health care provider.  

## 2015-01-23 ENCOUNTER — Encounter: Payer: Self-pay | Admitting: Family Medicine

## 2015-03-11 DIAGNOSIS — Z96652 Presence of left artificial knee joint: Secondary | ICD-10-CM | POA: Diagnosis not present

## 2015-03-11 DIAGNOSIS — Z96642 Presence of left artificial hip joint: Secondary | ICD-10-CM | POA: Diagnosis not present

## 2015-03-11 DIAGNOSIS — Z09 Encounter for follow-up examination after completed treatment for conditions other than malignant neoplasm: Secondary | ICD-10-CM | POA: Diagnosis not present

## 2015-04-08 DIAGNOSIS — Z23 Encounter for immunization: Secondary | ICD-10-CM | POA: Diagnosis not present

## 2015-04-24 ENCOUNTER — Ambulatory Visit: Payer: Self-pay | Admitting: Family Medicine

## 2015-04-26 ENCOUNTER — Ambulatory Visit (INDEPENDENT_AMBULATORY_CARE_PROVIDER_SITE_OTHER): Payer: Medicare Other | Admitting: Family Medicine

## 2015-04-26 ENCOUNTER — Encounter: Payer: Self-pay | Admitting: Family Medicine

## 2015-04-26 VITALS — BP 88/65 | HR 79 | Temp 98.6°F | Resp 16

## 2015-04-26 DIAGNOSIS — R509 Fever, unspecified: Secondary | ICD-10-CM | POA: Diagnosis not present

## 2015-04-26 DIAGNOSIS — F418 Other specified anxiety disorders: Secondary | ICD-10-CM

## 2015-04-26 DIAGNOSIS — E119 Type 2 diabetes mellitus without complications: Secondary | ICD-10-CM

## 2015-04-26 DIAGNOSIS — E7800 Pure hypercholesterolemia, unspecified: Secondary | ICD-10-CM | POA: Diagnosis not present

## 2015-04-26 DIAGNOSIS — D61818 Other pancytopenia: Secondary | ICD-10-CM | POA: Diagnosis not present

## 2015-04-26 DIAGNOSIS — C50211 Malignant neoplasm of upper-inner quadrant of right female breast: Secondary | ICD-10-CM

## 2015-04-26 DIAGNOSIS — Z1159 Encounter for screening for other viral diseases: Secondary | ICD-10-CM | POA: Diagnosis not present

## 2015-04-26 DIAGNOSIS — F329 Major depressive disorder, single episode, unspecified: Secondary | ICD-10-CM

## 2015-04-26 DIAGNOSIS — F419 Anxiety disorder, unspecified: Secondary | ICD-10-CM

## 2015-04-26 LAB — POCT URINALYSIS DIP (MANUAL ENTRY)
Blood, UA: NEGATIVE
GLUCOSE UA: NEGATIVE
LEUKOCYTES UA: NEGATIVE
Nitrite, UA: NEGATIVE
Spec Grav, UA: 1.015
UROBILINOGEN UA: 0.2
pH, UA: 6

## 2015-04-26 LAB — COMPREHENSIVE METABOLIC PANEL
ALK PHOS: 57 U/L (ref 33–130)
ALT: 19 U/L (ref 6–29)
AST: 21 U/L (ref 10–35)
Albumin: 3.9 g/dL (ref 3.6–5.1)
BILIRUBIN TOTAL: 0.7 mg/dL (ref 0.2–1.2)
BUN: 6 mg/dL — AB (ref 7–25)
CO2: 26 mmol/L (ref 20–31)
Calcium: 9 mg/dL (ref 8.6–10.4)
Chloride: 101 mmol/L (ref 98–110)
Creat: 0.8 mg/dL (ref 0.50–0.99)
Glucose, Bld: 100 mg/dL — ABNORMAL HIGH (ref 65–99)
Potassium: 4.1 mmol/L (ref 3.5–5.3)
SODIUM: 136 mmol/L (ref 135–146)
TOTAL PROTEIN: 6.3 g/dL (ref 6.1–8.1)

## 2015-04-26 LAB — LIPID PANEL
Cholesterol: 182 mg/dL (ref 125–200)
HDL: 95 mg/dL (ref >=46)
LDL Cholesterol: 75 mg/dL (ref <130)
Total CHOL/HDL Ratio: 1.9 Ratio (ref <=5.0)
Triglycerides: 61 mg/dL (ref <150)
VLDL: 12 mg/dL (ref <30)

## 2015-04-26 LAB — CBC WITH DIFFERENTIAL/PLATELET
BASOS ABS: 0 10*3/uL (ref 0.0–0.1)
Basophils Relative: 0 % (ref 0–1)
EOS ABS: 0.1 10*3/uL (ref 0.0–0.7)
Eosinophils Relative: 1 % (ref 0–5)
HCT: 33 % — ABNORMAL LOW (ref 36.0–46.0)
HEMOGLOBIN: 11.4 g/dL — AB (ref 12.0–15.0)
LYMPHS ABS: 1.5 10*3/uL (ref 0.7–4.0)
Lymphocytes Relative: 24 % (ref 12–46)
MCH: 29.4 pg (ref 26.0–34.0)
MCHC: 34.5 g/dL (ref 30.0–36.0)
MCV: 85.1 fL (ref 78.0–100.0)
MPV: 9.2 fL (ref 8.6–12.4)
Monocytes Absolute: 0.5 10*3/uL (ref 0.1–1.0)
Monocytes Relative: 9 % (ref 3–12)
NEUTROS ABS: 4 10*3/uL (ref 1.7–7.7)
NEUTROS PCT: 66 % (ref 43–77)
PLATELETS: 170 10*3/uL (ref 150–400)
RBC: 3.88 MIL/uL (ref 3.87–5.11)
RDW: 13.7 % (ref 11.5–15.5)
WBC: 6.1 10*3/uL (ref 4.0–10.5)

## 2015-04-26 LAB — HEPATITIS C ANTIBODY: HCV AB: NEGATIVE

## 2015-04-26 LAB — POCT GLYCOSYLATED HEMOGLOBIN (HGB A1C): Hemoglobin A1C: 5.8

## 2015-04-26 LAB — HEMOGLOBIN A1C: Hgb A1c MFr Bld: 5.8 % (ref 4.0–6.0)

## 2015-04-26 NOTE — Progress Notes (Signed)
Subjective:    Patient ID: Alisha Matthews, female    DOB: 11/19/1950, 64 y.o.   MRN: [id number]  04/26/2015  Follow-up; Diabetes; Hypertension; and Hyperlipidemia   HPI This 64 y.o. female presents for three month follow-up:   1.  Fever/sweats/chills: onset last night; no other symptoms.  No dizziness despite low blood pressure; did not drink anything this morning.  2.  DMII: Patient reports good compliance with medication, good tolerance to medication, and good symptom control.  Not checking sugars.  3.  Hypercholesterolemia: Patient reports good compliance with medication, good tolerance to medication, and good symptom control.    4.  Anxiety and depression: Patient reports good compliance with medication, good tolerance to medication, and good symptom control.    5. OA knees:  Doing great.  Exercising on stairmaster daily.     Review of Systems  Constitutional: Positive for fever, chills, diaphoresis and fatigue.  Eyes: Negative for visual disturbance.  Respiratory: Negative for cough and shortness of breath.   Cardiovascular: Negative for chest pain, palpitations and leg swelling.  Gastrointestinal: Negative for nausea, vomiting, abdominal pain, diarrhea and constipation.  Endocrine: Negative for cold intolerance, heat intolerance, polydipsia, polyphagia and polyuria.  Skin: Negative for color change and wound.  Neurological: Negative for dizziness, tremors, seizures, syncope, facial asymmetry, speech difficulty, weakness, light-headedness, numbness and headaches.  Psychiatric/Behavioral: Positive for sleep disturbance. Negative for suicidal ideas, self-injury and dysphoric mood. The patient is nervous/anxious.     Past Medical History  Diagnosis Date  . Anxiety   . Arthritis   . Diabetes mellitus     borderline  . GERD (gastroesophageal reflux disease)   . Hyperlipidemia   . Insomnia, unspecified   . Leukocytopenia, unspecified   . Barrett's esophagus   . Breast  fibrocystic disorder   . Genital warts   . Osteoarthrosis, unspecified whether generalized or localized, ankle and foot     ankle/foot  . Exercise induced bronchospasm   . Personal history of colonic polyps   . Migraine, unspecified, without mention of intractable migraine without mention of status migrainosus   . Symptomatic menopausal or female climacteric states   . Obesity, unspecified   . Essential hypertension, benign   . Depressive disorder, not elsewhere classified   . Anemia, unspecified   . Chicken pox   . Measles   . Mumps   . Breast cancer ([place])     right breast invasive ductal carcinoma   . Infected postoperative breast seroma   . Bronchitis   . Constipation, chronic    Past Surgical History  Procedure Laterality Date  . Cholecystectomy    . Bunionectomy      3 surgeries on both feet  . Nasal septum surgery    . Total hip arthroplasty      left      Perthes disease  . Foot surgery      Left-revision  . Dilation and curettage of uterus  1978  . Breast lumpectomy with needle localization and axillary sentinel lymph node bx  06/08/2012    Procedure: BREAST LUMPECTOMY WITH NEEDLE LOCALIZATION AND AXILLARY SENTINEL LYMPH NODE BX;  Surgeon: Rolm Bookbinder, MD;  Location: [facility];  Service: General;  Laterality: Right;  . Re-excision of breast cancer,superior margins  06/27/2012    Procedure: RE-EXCISION OF BREAST CANCER,SUPERIOR MARGINS;  Surgeon: Rolm Bookbinder, MD;  Location: [facility];  Service: General;  Laterality: Right;  . Eye surgery      Orbital  right eye surgery  . Colonoscopy    . Total knee arthroplasty Left 02/20/2014    Procedure: TOTAL KNEE ARTHROPLASTY;  Surgeon: Hessie Dibble, MD;  Location: [facility];  Service: Orthopedics;  Laterality: Left;   Allergies  Allergen Reactions  . Penicillins Rash   Current Outpatient Prescriptions  Medication Sig Dispense Refill  . albuterol (PROVENTIL HFA;VENTOLIN HFA) 108 (90  BASE) MCG/ACT inhaler Inhale 2 puffs into the lungs every 6 (six) hours as needed. Asthmatic attack 18 g 3  . aspirin EC 325 MG EC tablet Take 1 tablet (325 mg total) by mouth 2 (two) times daily after a meal. 30 tablet 0  . atorvastatin (LIPITOR) 20 MG tablet Take 1 tablet (20 mg total) by mouth daily. 90 tablet 3  . Calcium-Magnesium-Vitamin D (CALCIUM 500 PO) Take 500 mg by mouth 3 (three) times daily.    . clindamycin (CLEOCIN) 150 MG capsule Take 600 mg by mouth as needed (dental work).     . Coenzyme Q10 (COQ10) 200 MG CAPS Take 200 mg by mouth daily.    . diclofenac (VOLTAREN) 75 MG EC tablet Take 1 tablet (75 mg total) by mouth 2 (two) times daily. 180 tablet 3  . Diindolylmethane POWD 1 Package by Does not apply route daily.    . diphenhydrAMINE (BENADRYL) 25 MG tablet Take 25 mg by mouth at bedtime as needed for sleep. When not taking xanax.    . docusate sodium (COLACE) 100 MG capsule Take 100 mg by mouth 2 (two) times daily.    Marland Kitchen esomeprazole (NEXIUM) 40 MG capsule Take 1 capsule (40 mg total) by mouth daily. 90 capsule 3  . frovatriptan (FROVA) 2.5 MG tablet Take 2.5 mg by mouth as needed for migraine (max 3 tabs in 24 hours). If recurs, may repeat after 2 hours. Max of 3 tabs in 24 hours.    . LevOCARNitine (CARNITINE PO) Take 1 capsule by mouth daily.    Marland Kitchen lisinopril (PRINIVIL,ZESTRIL) 5 MG tablet Take 1 tablet (5 mg total) by mouth daily. 90 tablet 3  . metFORMIN (GLUCOPHAGE XR) 500 MG 24 hr tablet One bid 180 tablet 3  . methocarbamol (ROBAXIN) 500 MG tablet Take 1 tablet (500 mg total) by mouth every 6 (six) hours as needed for muscle spasms. 50 tablet 0  . misoprostol (CYTOTEC) 200 MCG tablet Take 1 tablet (200 mcg total) by mouth 4 (four) times daily. 120 tablet 5  . Multiple Vitamins-Minerals (ANTIOXIDANT PO) Take 1 capsule by mouth daily. Fruit    . OVER THE COUNTER MEDICATION Take 1 capsule by mouth daily. Procast package: Elite-100 omega 3 vitamin    . OVER THE COUNTER  MEDICATION Take 1 capsule by mouth daily. Procast: Memory,brain and liver vitamin    . OVER THE COUNTER MEDICATION Take 1 capsule by mouth daily. Circulation and vein support vitamin    . PRESCRIPTION MEDICATION Percocet 5 mg  taking every 4-6 hours for pain    . Probiotic Product (PROBIOTIC DAILY PO) Take 1 capsule by mouth daily.    . simethicone (MYLICON) 80 MG chewable tablet Chew 80 mg by mouth every 6 (six) hours as needed for flatulence.    . ALPRAZolam (XANAX) 0.5 MG tablet Take 1 tablet (0.5 mg total) by mouth at bedtime as needed for anxiety. 30 tablet 3  . PARoxetine (PAXIL) 20 MG tablet Take 1 tablet (20 mg total) by mouth daily. 90 tablet 3   No current facility-administered medications for this visit.  Social History   Social History  . Marital Status: Married    Spouse Name: N/A  . Number of Children: 0  . Years of Education: college   Occupational History  . unemployed [employer]    previous [employer] at [employer]  . Smoking status: Never Smoker   . Smokeless tobacco: Never Used  . Alcohol Use: No  . Drug Use: No  . Sexual Activity: No     Comment: post menopausal   Other Topics Concern  . Not on file   Social History Narrative   Marital status:  Married x 18 years happily, second marriage. No domestic abuse; 5 cats; no children.      Lives: with husband, 5 cats.      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 Walking 1-1.24miles per day      Caffeine: + Moderate caffeine use.          Seatbelt: 100%      Guns:  Unloaded gun.      Advanced Directives:  Living Will --- FULL CODE but no prolonged measures.     Family History  Problem Relation Age of Onset  . Adopted: Yes  . Hypertension Mother   . Diabetes Mother   . Heart failure Mother   . Cancer Maternal Grandmother  62    non hodgkins lymphoma       Objective:    BP 88/65 mmHg  Pulse 79  Temp(Src) 98.6 F (37 C)  Resp 16  Ht   Wt   LMP 06/22/2000 Physical Exam  Constitutional: She is oriented to person, place, and time. She appears well-developed and well-nourished. No distress.  HENT:  Head: Normocephalic and atraumatic.  Right Ear: External ear normal.  Left Ear: External ear normal.  Nose: Nose normal.  Mouth/Throat: Oropharynx is clear and moist.  Eyes: Conjunctivae and EOM are normal. Pupils are equal, round, and reactive to light.  Neck: Normal range of motion. Neck supple. Carotid bruit is not present. No thyromegaly present.  Cardiovascular: Normal rate, regular rhythm, normal heart sounds and intact distal pulses.  Exam reveals no gallop and no friction rub.   No murmur heard. Pulmonary/Chest: Effort normal and breath sounds normal. She has no wheezes. She has no rales.  Abdominal: Soft. Bowel sounds are normal. She exhibits no distension and no mass. There is no tenderness. There is no rebound and no guarding.  Lymphadenopathy:    She has no cervical adenopathy.  Neurological: She is alert and oriented to person, place, and time. No cranial nerve deficit. She exhibits normal muscle tone. Coordination normal.  Skin: Skin is warm and dry. No rash noted. She is not diaphoretic. No erythema. No pallor.  Psychiatric: She has a normal mood and affect. Her behavior is normal.        Assessment & Plan:   1. Pure hypercholesterolemia   2. Anxiety and depression   3. Breast cancer of upper-inner quadrant of right female breast ([place])   4. Other pancytopenia ([place])   5. Diabetes mellitus without complication ([place])   6. Need for hepatitis C screening test   7. Fever, unspecified    1. DMII: controlled;  obtain labs; continue medications. 2.  Anxiety and depression: improved in retired; no changes to management. 3.  Breast cancer R breast: stable; doing well. 4.  Pancytopenia:  chronic; obtain labs. 5.  Hypercholesterolemia: stable; obtain labs.  Continue current medications. 5.  Fever: new. Etiology unclear; no other symptoms; obtain labs.  RTC for worsening.  HOLD Lisinopril; has not been taking recently. 6.  Need for hepatitis C screening  Orders Placed This Encounter  Procedures  . CBC with Differential/Platelet  . Comprehensive metabolic panel    Order Specific Question:  Has the patient fasted?    Answer:  Yes  . Lipid panel    Order Specific Question:  Has the patient fasted?    Answer:  Yes  . Hepatitis C antibody  . Microalbumin, urine  . POCT glycosylated hemoglobin (Hb A1C)  . POCT urinalysis dipstick   No orders of the defined types were placed in this encounter.    Return in about 4 months (around 08/24/2015) for recheck.     Kristi Elayne Guerin, M.D. [facility] [address] [place], [place]  [address] [phone] phone [phone] fax

## 2015-04-29 LAB — MICROALBUMIN, URINE: Microalb, Ur: 10.1 mg/dL

## 2015-05-02 ENCOUNTER — Encounter: Payer: Self-pay | Admitting: Family Medicine

## 2015-05-02 ENCOUNTER — Telehealth: Payer: Self-pay

## 2015-05-02 MED ORDER — PAROXETINE HCL 20 MG PO TABS: 20.0000 mg | ORAL_TABLET | Freq: Every day | ORAL | Status: DC

## 2015-05-02 MED ORDER — ALPRAZOLAM 0.5 MG PO TABS: 0.5000 mg | ORAL_TABLET | Freq: Every evening | ORAL | Status: DC | PRN

## 2015-05-02 NOTE — Telephone Encounter (Signed)
Approved; please fax Alprazolam to pharmacy.

## 2015-05-02 NOTE — Telephone Encounter (Signed)
Pt called in wanting a refill on ALPRAZolam (XANAX) 0.5 MG tablet [id number] and PARoxetine (PAXIL) 20 MG tablet [id number]. Pharmacy is good. CB # [phone]

## 2015-05-03 NOTE — Telephone Encounter (Signed)
rx for xanax called in.  Pt notified.

## 2015-05-17 ENCOUNTER — Telehealth: Payer: Self-pay

## 2015-05-17 NOTE — Telephone Encounter (Signed)
Pt needs her latest labs sent to her.  Please advise   [phone]

## 2015-05-23 ENCOUNTER — Encounter: Payer: Self-pay | Admitting: Family Medicine

## 2015-05-27 ENCOUNTER — Telehealth: Payer: Self-pay | Admitting: *Deleted

## 2015-05-27 ENCOUNTER — Encounter: Payer: Self-pay | Admitting: *Deleted

## 2015-05-27 ENCOUNTER — Telehealth: Payer: Self-pay | Admitting: Oncology

## 2015-05-27 DIAGNOSIS — Z853 Personal history of malignant neoplasm of breast: Secondary | ICD-10-CM | POA: Diagnosis not present

## 2015-05-27 NOTE — Telephone Encounter (Signed)
Pt requests copy of lab results to be mailed to her.  Copy mailed to pt.

## 2015-05-27 NOTE — Telephone Encounter (Signed)
Patient called in and left a message to cancel her 12/19 appointment with dr Jana Hakim as he had advised her that if her mammo was normal then she did not have to come in,per patient she had a normal mammo today at [facility]

## 2015-06-10 ENCOUNTER — Ambulatory Visit: Payer: Medicare Other | Admitting: Oncology

## 2015-08-05 MED FILL — ALPRAZolam 0.5 MG TABS: 0.5 | 30 days supply | Qty: 30 | Fill #2

## 2015-08-05 MED FILL — ESOMEPRAZOLE MAG DR 40 MG C: 40 | 30 days supply | Qty: 30 | Fill #2

## 2015-08-05 MED FILL — miSOPROStol 200 MCG TABS: 200 | 30 days supply | Qty: 120 | Fill #3

## 2015-08-05 MED FILL — DICLOFENAC SOD EC 75 MG TAB: 75 | 90 days supply | Qty: 180 | Fill #2

## 2015-08-26 ENCOUNTER — Ambulatory Visit: Payer: Medicare Other | Admitting: Family Medicine

## 2015-09-10 ENCOUNTER — Encounter: Payer: Self-pay | Admitting: Family Medicine

## 2015-09-10 ENCOUNTER — Ambulatory Visit (INDEPENDENT_AMBULATORY_CARE_PROVIDER_SITE_OTHER): Payer: Medicare Other | Admitting: Family Medicine

## 2015-09-10 VITALS — BP 121/66 | HR 69 | Temp 99.1°F | Resp 16 | Ht 62.5 in | Wt 156.0 lb

## 2015-09-10 DIAGNOSIS — C50211 Malignant neoplasm of upper-inner quadrant of right female breast: Secondary | ICD-10-CM

## 2015-09-10 DIAGNOSIS — M8580 Other specified disorders of bone density and structure, unspecified site: Secondary | ICD-10-CM | POA: Diagnosis not present

## 2015-09-10 DIAGNOSIS — F418 Other specified anxiety disorders: Secondary | ICD-10-CM

## 2015-09-10 DIAGNOSIS — D62 Acute posthemorrhagic anemia: Secondary | ICD-10-CM | POA: Diagnosis not present

## 2015-09-10 DIAGNOSIS — E119 Type 2 diabetes mellitus without complications: Secondary | ICD-10-CM | POA: Diagnosis not present

## 2015-09-10 DIAGNOSIS — Z23 Encounter for immunization: Secondary | ICD-10-CM

## 2015-09-10 DIAGNOSIS — F329 Major depressive disorder, single episode, unspecified: Secondary | ICD-10-CM

## 2015-09-10 DIAGNOSIS — E7800 Pure hypercholesterolemia, unspecified: Secondary | ICD-10-CM | POA: Diagnosis not present

## 2015-09-10 DIAGNOSIS — F419 Anxiety disorder, unspecified: Secondary | ICD-10-CM

## 2015-09-10 LAB — CBC WITH DIFFERENTIAL/PLATELET
BASOS PCT: 1 % (ref 0–1)
Basophils Absolute: 0 10*3/uL (ref 0.0–0.1)
EOS ABS: 0.1 10*3/uL (ref 0.0–0.7)
Eosinophils Relative: 2 % (ref 0–5)
HCT: 33.1 % — ABNORMAL LOW (ref 36.0–46.0)
HEMOGLOBIN: 11 g/dL — AB (ref 12.0–15.0)
LYMPHS ABS: 1.3 10*3/uL (ref 0.7–4.0)
Lymphocytes Relative: 32 % (ref 12–46)
MCH: 28.6 pg (ref 26.0–34.0)
MCHC: 33.2 g/dL (ref 30.0–36.0)
MCV: 86.2 fL (ref 78.0–100.0)
MONO ABS: 0.3 10*3/uL (ref 0.1–1.0)
MONOS PCT: 7 % (ref 3–12)
MPV: 9.3 fL (ref 8.6–12.4)
NEUTROS ABS: 2.4 10*3/uL (ref 1.7–7.7)
Neutrophils Relative %: 58 % (ref 43–77)
Platelets: 226 10*3/uL (ref 150–400)
RBC: 3.84 MIL/uL — ABNORMAL LOW (ref 3.87–5.11)
RDW: 13.9 % (ref 11.5–15.5)
WBC: 4.1 10*3/uL (ref 4.0–10.5)

## 2015-09-10 MED ORDER — METHOCARBAMOL 500 MG PO TABS: 500.0000 mg | ORAL_TABLET | Freq: Three times a day (TID) | ORAL | Status: DC | PRN

## 2015-09-10 NOTE — Patient Instructions (Signed)
     IF you received an x-ray today, you will receive an invoice from [facility]. Please contact [facility] at [phone] with questions or concerns regarding your invoice.   IF you received labwork today, you will receive an invoice from [facility]. Please contact [facility] at [phone] with questions or concerns regarding your invoice.   Our billing staff will not be able to assist you with questions regarding bills from these companies.  You will be contacted with the lab results as soon as they are available. The fastest way to get your results is to activate your My Chart account. Instructions are located on the last page of this paperwork. If you have not heard from us regarding the results in 2 weeks, please contact this office.      

## 2015-09-10 NOTE — Progress Notes (Signed)
Subjective:    Patient ID: Alisha Matthews, female    DOB: 08/28/1950, 65 y.o.   MRN: [id number]  09/10/2015  Follow-up and Medication Refill   HPI This 65 y.o. female presents for four month follow-up:  1. DMII: Patient reports good compliance with medication, good tolerance to medication, and good symptom control.   Daily stairstepper. 2.  Hyperlipidemia: Patient reports good compliance with medication, good tolerance to medication, and good symptom control.    3.  Anxiety and depression: Patient reports good compliance with medication, good tolerance to medication, and good symptom control.  Husband retiring next February 2018.  Takes mini-trips; involved with church; does hobbies.  Exercising daily.  Works with rescue groups with animals.  Mind still races at night; taking Xanax 4 days per week; takes Benadryl.  4.  Cough: onset 2.5 weeks ago; s/p high dose flu vaccine.  Fever/chills.  Persistent cough. Took Levaquin that husband had 500mg  daily for ten days; took husband's cough syrup.  Dry hacking cough with a tickle.  Husband also recently sick; still coughing.     Review of Systems  Constitutional: Negative for fever, chills, diaphoresis and fatigue.  Eyes: Negative for visual disturbance.  Respiratory: Negative for cough and shortness of breath.   Cardiovascular: Negative for chest pain, palpitations and leg swelling.  Gastrointestinal: Negative for nausea, vomiting, abdominal pain, diarrhea and constipation.  Endocrine: Negative for cold intolerance, heat intolerance, polydipsia, polyphagia and polyuria.  Neurological: Negative for dizziness, tremors, seizures, syncope, facial asymmetry, speech difficulty, weakness, light-headedness, numbness and headaches.    Past Medical History  Diagnosis Date  . Anxiety   . Arthritis   . Diabetes mellitus     borderline  . GERD (gastroesophageal reflux disease)   . Hyperlipidemia   . Insomnia, unspecified   . Leukocytopenia,  unspecified   . Barrett's esophagus   . Breast fibrocystic disorder   . Genital warts   . Osteoarthrosis, unspecified whether generalized or localized, ankle and foot     ankle/foot  . Exercise induced bronchospasm   . Personal history of colonic polyps   . Migraine, unspecified, without mention of intractable migraine without mention of status migrainosus   . Symptomatic menopausal or female climacteric states   . Obesity, unspecified   . Essential hypertension, benign   . Depressive disorder, not elsewhere classified   . Anemia, unspecified   . Chicken pox   . Measles   . Mumps   . Breast cancer ([place])     right breast invasive ductal carcinoma   . Infected postoperative breast seroma   . Bronchitis   . Constipation, chronic    Past Surgical History  Procedure Laterality Date  . Cholecystectomy    . Bunionectomy      3 surgeries on both feet  . Nasal septum surgery    . Total hip arthroplasty      left      Perthes disease  . Foot surgery      Left-revision  . Dilation and curettage of uterus  1978  . Breast lumpectomy with needle localization and axillary sentinel lymph node bx  06/08/2012    Procedure: BREAST LUMPECTOMY WITH NEEDLE LOCALIZATION AND AXILLARY SENTINEL LYMPH NODE BX;  Surgeon: Rolm Bookbinder, MD;  Location: [facility];  Service: General;  Laterality: Right;  . Re-excision of breast cancer,superior margins  06/27/2012    Procedure: RE-EXCISION OF BREAST CANCER,SUPERIOR MARGINS;  Surgeon: Rolm Bookbinder, MD;  Location: [facility];  Service: General;  Laterality: Right;  . Eye surgery      Orbital right eye surgery  . Colonoscopy    . Total knee arthroplasty Left 02/20/2014    Procedure: TOTAL KNEE ARTHROPLASTY;  Surgeon: Hessie Dibble, MD;  Location: [facility];  Service: Orthopedics;  Laterality: Left;   Allergies  Allergen Reactions  . Penicillins Rash   Current Outpatient Prescriptions  Medication Sig Dispense Refill  .  albuterol (PROVENTIL HFA;VENTOLIN HFA) 108 (90 BASE) MCG/ACT inhaler Inhale 2 puffs into the lungs every 6 (six) hours as needed. Asthmatic attack 18 g 3  . ALPRAZolam (XANAX) 0.5 MG tablet Take 1 tablet (0.5 mg total) by mouth at bedtime as needed for anxiety. 30 tablet 3  . atorvastatin (LIPITOR) 20 MG tablet Take 1 tablet (20 mg total) by mouth daily. 90 tablet 3  . Calcium-Magnesium-Vitamin D (CALCIUM 500 PO) Take 500 mg by mouth 3 (three) times daily.    . clindamycin (CLEOCIN) 150 MG capsule Take 600 mg by mouth as needed (dental work).     . Coenzyme Q10 (COQ10) 200 MG CAPS Take 200 mg by mouth daily.    . diclofenac (VOLTAREN) 75 MG EC tablet Take 1 tablet (75 mg total) by mouth 2 (two) times daily. 180 tablet 3  . Diindolylmethane POWD 1 Package by Does not apply route daily. Reported on 09/10/2015    . diphenhydrAMINE (BENADRYL) 25 MG tablet Take 25 mg by mouth at bedtime as needed for sleep. When not taking xanax.    . esomeprazole (NEXIUM) 40 MG capsule Take 1 capsule (40 mg total) by mouth daily. 90 capsule 3  . frovatriptan (FROVA) 2.5 MG tablet Take 2.5 mg by mouth as needed for migraine (max 3 tabs in 24 hours). If recurs, may repeat after 2 hours. Max of 3 tabs in 24 hours.    . LevOCARNitine (CARNITINE PO) Take 1 capsule by mouth daily.    Marland Kitchen lisinopril (PRINIVIL,ZESTRIL) 5 MG tablet Take 1 tablet (5 mg total) by mouth daily. 90 tablet 3  . metFORMIN (GLUCOPHAGE XR) 500 MG 24 hr tablet One bid 180 tablet 3  . methocarbamol (ROBAXIN) 500 MG tablet Take 1 tablet (500 mg total) by mouth every 8 (eight) hours as needed for muscle spasms. 50 tablet 0  . misoprostol (CYTOTEC) 200 MCG tablet Take 1 tablet (200 mcg total) by mouth 4 (four) times daily. 120 tablet 5  . Multiple Vitamins-Minerals (ANTIOXIDANT PO) Take 1 capsule by mouth daily. Fruit    . OVER THE COUNTER MEDICATION Take 1 capsule by mouth daily. Procast package: Elite-100 omega 3 vitamin    . OVER THE COUNTER MEDICATION Take  1 capsule by mouth daily. Procast: Memory,brain and liver vitamin    . OVER THE COUNTER MEDICATION Take 1 capsule by mouth daily. Circulation and vein support vitamin    . PARoxetine (PAXIL) 20 MG tablet Take 1 tablet (20 mg total) by mouth daily. 90 tablet 3  . Probiotic Product (PROBIOTIC DAILY PO) Take 1 capsule by mouth daily.    . simethicone (MYLICON) 80 MG chewable tablet Chew 80 mg by mouth every 6 (six) hours as needed for flatulence.    Marland Kitchen aspirin EC 325 MG EC tablet Take 1 tablet (325 mg total) by mouth 2 (two) times daily after a meal. (Patient not taking: Reported on 09/10/2015) 30 tablet 0  . docusate sodium (COLACE) 100 MG capsule Take 100 mg by mouth 2 (two) times daily. Reported on 09/10/2015    .  PRESCRIPTION MEDICATION Reported on 09/10/2015     No current facility-administered medications for this visit.   Social History   Social History  . Marital Status: Married    Spouse Name: N/A  . Number of Children: 0  . Years of Education: college   Occupational History  . unemployed [employer]    previous [employer] at [employer]  . Smoking status: Never Smoker   . Smokeless tobacco: Never Used  . Alcohol Use: No  . Drug Use: No  . Sexual Activity: No     Comment: post menopausal   Other Topics Concern  . Not on file   Social History Narrative   Marital status:  Married x 18 years happily, second marriage. No domestic abuse; 5 cats; no children.      Lives: with husband, 5 cats.      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 Walking 1-1.86miles per day      Caffeine: + Moderate caffeine use.          Seatbelt: 100%      Guns:  Unloaded gun.      Advanced Directives:  Living Will --- FULL CODE but no prolonged measures.     Family History  Problem Relation Age of Onset  . Adopted:  Yes  . Hypertension Mother   . Diabetes Mother   . Heart failure Mother   . Cancer Maternal Grandmother 62    non hodgkins lymphoma       Objective:    BP 121/66 mmHg  Pulse 69  Temp(Src) 99.1 F (37.3 C) (Oral)  Resp 16  Ht 5' 2.5" (1.588 m)  Wt 156 lb (70.761 kg)  BMI 28.06 kg/m2  LMP 06/22/2000 Physical Exam  Constitutional: She is oriented to person, place, and time. She appears well-developed and well-nourished. No distress.  HENT:  Head: Normocephalic and atraumatic.  Right Ear: External ear normal.  Left Ear: External ear normal.  Nose: Nose normal.  Mouth/Throat: Oropharynx is clear and moist.  Eyes: Conjunctivae and EOM are normal. Pupils are equal, round, and reactive to light.  Neck: Normal range of motion. Neck supple. Carotid bruit is not present. No thyromegaly present.  Cardiovascular: Normal rate, regular rhythm, normal heart sounds and intact distal pulses.  Exam reveals no gallop and no friction rub.   No murmur heard. Pulmonary/Chest: Effort normal and breath sounds normal. She has no wheezes. She has no rales.  Abdominal: Soft. Bowel sounds are normal. She exhibits no distension and no mass. There is no tenderness. There is no rebound and no guarding.  Lymphadenopathy:    She has no cervical adenopathy.  Neurological: She is alert and oriented to person, place, and time. No cranial nerve deficit.  Skin: Skin is warm and dry. No rash noted. She is not diaphoretic. No erythema. No pallor.  Psychiatric: She has a normal mood and affect. Her behavior is normal.        Assessment & Plan:   1. Pure hypercholesterolemia   2. Anxiety and depression   3. Breast cancer of upper-inner quadrant of right female breast ([place])   4. Postoperative anemia due to acute blood loss  5. Osteopenia   6. Diabetes mellitus without complication ([place])     Orders Placed This Encounter  Procedures  . Pneumococcal polysaccharide vaccine 23-valent greater than or equal to  2yo subcutaneous/IM  . CBC with Differential/Platelet  . Comprehensive metabolic panel    Order Specific Question:  Has the patient fasted?    Answer:  Yes  . Hemoglobin A1c  . Lipid panel    Order Specific Question:  Has the patient fasted?    Answer:  Yes   Meds ordered this encounter  Medications  . methocarbamol (ROBAXIN) 500 MG tablet    Sig: Take 1 tablet (500 mg total) by mouth every 8 (eight) hours as needed for muscle spasms.    Dispense:  50 tablet    Refill:  0    Return in about 6 months (around 03/12/2016) for complete physical examiniation.    Norelle Runnion Elayne Guerin, M.D. [facility] [address] [place], [place]  [address] [phone] phone [phone] fax

## 2015-09-11 LAB — COMPREHENSIVE METABOLIC PANEL
ALBUMIN: 3.9 g/dL (ref 3.6–5.1)
ALT: 25 U/L (ref 6–29)
AST: 28 U/L (ref 10–35)
Alkaline Phosphatase: 60 U/L (ref 33–130)
BUN: 8 mg/dL (ref 7–25)
CHLORIDE: 101 mmol/L (ref 98–110)
CO2: 26 mmol/L (ref 20–31)
CREATININE: 0.69 mg/dL (ref 0.50–0.99)
Calcium: 8.7 mg/dL (ref 8.6–10.4)
Glucose, Bld: 102 mg/dL — ABNORMAL HIGH (ref 65–99)
POTASSIUM: 4.3 mmol/L (ref 3.5–5.3)
SODIUM: 136 mmol/L (ref 135–146)
Total Bilirubin: 0.4 mg/dL (ref 0.2–1.2)
Total Protein: 6.3 g/dL (ref 6.1–8.1)

## 2015-09-11 LAB — LIPID PANEL
CHOL/HDL RATIO: 2.6 ratio (ref <=5.0)
CHOLESTEROL: 187 mg/dL (ref 125–200)
HDL: 73 mg/dL (ref >=46)
LDL Cholesterol: 99 mg/dL (ref <130)
TRIGLYCERIDES: 75 mg/dL (ref <150)
VLDL: 15 mg/dL (ref <30)

## 2015-09-11 LAB — HEMOGLOBIN A1C
HEMOGLOBIN A1C: 6.2 % — AB (ref <5.7)
MEAN PLASMA GLUCOSE: 131 mg/dL — AB (ref <117)

## 2015-09-23 ENCOUNTER — Telehealth: Payer: Self-pay

## 2015-09-23 NOTE — Telephone Encounter (Signed)
Pt called requesting lab results from 3/21.   Pt says they have tried multiple times in the past couple weeks to receive results. Pt would like results printed once they are reviewed and notified when available to pick up.

## 2015-09-27 MED FILL — ALPRAZolam 0.5 MG TABS: 0.5 | 30 days supply | Qty: 30 | Fill #3

## 2015-09-27 MED FILL — ATORVASTATIN 20 MG TABLET: 20 | 90 days supply | Qty: 90 | Fill #3

## 2015-09-30 MED FILL — METHOCARBAMOL 500 MG TABLET: 500 | 17 days supply | Qty: 50 | Fill #0

## 2015-10-01 ENCOUNTER — Encounter: Payer: Self-pay | Admitting: Family Medicine

## 2015-10-02 NOTE — Telephone Encounter (Signed)
Lab results sent to lab pool to contact patient with results and to mail copy of labs to patient.

## 2015-10-07 MED FILL — miSOPROStol 200 MCG TABS: 200 | 30 days supply | Qty: 120 | Fill #4

## 2015-10-07 MED FILL — METFORMIN HCL ER 500 MG TAB: 500 | 90 days supply | Qty: 180 | Fill #1

## 2015-10-16 MED FILL — CLINDAMYCIN HCL 150 MG CAP: 150 | 1 days supply | Qty: 4 | Fill #1

## 2015-10-25 ENCOUNTER — Other Ambulatory Visit: Payer: Self-pay | Admitting: Family Medicine

## 2015-10-25 NOTE — Telephone Encounter (Signed)
Please call in refill for Xanax as approved.

## 2015-10-28 ENCOUNTER — Other Ambulatory Visit: Payer: Self-pay | Admitting: Family Medicine

## 2015-10-28 MED FILL — ALPRAZolam 0.5 MG TABS: 0.5 | 30 days supply | Qty: 30 | Fill #0

## 2015-10-28 NOTE — Telephone Encounter (Signed)
Called in.

## 2015-11-21 ENCOUNTER — Other Ambulatory Visit: Payer: Self-pay | Admitting: Family Medicine

## 2015-11-21 NOTE — Telephone Encounter (Signed)
Can we refill? She was seen 3/17?

## 2015-11-26 MED FILL — miSOPROStol 200 MCG TABS: 200 | 30 days supply | Qty: 120 | Fill #0

## 2015-11-26 NOTE — Telephone Encounter (Signed)
Rx approved

## 2015-12-04 MED FILL — ALPRAZolam 0.5 MG TABS: 0.5 | 30 days supply | Qty: 30 | Fill #1

## 2015-12-27 MED FILL — miSOPROStol 200 MCG TABS: 200 | 30 days supply | Qty: 120 | Fill #1

## 2016-01-14 MED FILL — ALPRAZolam 0.5 MG TABS: 0.5 | 30 days supply | Qty: 30 | Fill #2

## 2016-01-31 ENCOUNTER — Other Ambulatory Visit: Payer: Self-pay | Admitting: Family Medicine

## 2016-02-03 ENCOUNTER — Other Ambulatory Visit: Payer: Self-pay

## 2016-02-03 MED FILL — METFORMIN HCL ER 500 MG TAB: 500 | 30 days supply | Qty: 60 | Fill #0

## 2016-02-11 MED FILL — ALPRAZolam 0.5 MG TABS: 0.5 | 30 days supply | Qty: 30 | Fill #3

## 2016-02-25 ENCOUNTER — Encounter: Payer: Self-pay | Admitting: Family Medicine

## 2016-02-25 ENCOUNTER — Ambulatory Visit (INDEPENDENT_AMBULATORY_CARE_PROVIDER_SITE_OTHER): Payer: Medicare Other | Admitting: Family Medicine

## 2016-02-25 VITALS — BP 120/78 | HR 92 | Temp 98.3°F | Resp 18 | Ht 62.5 in | Wt 168.0 lb

## 2016-02-25 DIAGNOSIS — C50211 Malignant neoplasm of upper-inner quadrant of right female breast: Secondary | ICD-10-CM | POA: Diagnosis not present

## 2016-02-25 DIAGNOSIS — E7800 Pure hypercholesterolemia, unspecified: Secondary | ICD-10-CM

## 2016-02-25 DIAGNOSIS — M8580 Other specified disorders of bone density and structure, unspecified site: Secondary | ICD-10-CM

## 2016-02-25 DIAGNOSIS — Z124 Encounter for screening for malignant neoplasm of cervix: Secondary | ICD-10-CM | POA: Diagnosis not present

## 2016-02-25 DIAGNOSIS — D61818 Other pancytopenia: Secondary | ICD-10-CM | POA: Diagnosis not present

## 2016-02-25 DIAGNOSIS — F419 Anxiety disorder, unspecified: Secondary | ICD-10-CM

## 2016-02-25 DIAGNOSIS — E119 Type 2 diabetes mellitus without complications: Secondary | ICD-10-CM | POA: Diagnosis not present

## 2016-02-25 DIAGNOSIS — F329 Major depressive disorder, single episode, unspecified: Secondary | ICD-10-CM

## 2016-02-25 DIAGNOSIS — Z0000 Encounter for general adult medical examination without abnormal findings: Secondary | ICD-10-CM | POA: Diagnosis not present

## 2016-02-25 DIAGNOSIS — F418 Other specified anxiety disorders: Secondary | ICD-10-CM | POA: Diagnosis not present

## 2016-02-25 LAB — CBC WITH DIFFERENTIAL/PLATELET
BASOS PCT: 1 %
Basophils Absolute: 34 cells/uL (ref 0–200)
EOS ABS: 102 {cells}/uL (ref 15–500)
Eosinophils Relative: 3 %
HEMATOCRIT: 35.5 % (ref 35.0–45.0)
HEMOGLOBIN: 11.6 g/dL — AB (ref 11.7–15.5)
Lymphocytes Relative: 34 %
Lymphs Abs: 1156 cells/uL (ref 850–3900)
MCH: 28.2 pg (ref 27.0–33.0)
MCHC: 32.7 g/dL (ref 32.0–36.0)
MCV: 86.2 fL (ref 80.0–100.0)
MPV: 9.2 fL (ref 7.5–12.5)
Monocytes Absolute: 306 cells/uL (ref 200–950)
Monocytes Relative: 9 %
NEUTROS ABS: 1802 {cells}/uL (ref 1500–7800)
Neutrophils Relative %: 53 %
Platelets: 186 10*3/uL (ref 140–400)
RBC: 4.12 MIL/uL (ref 3.80–5.10)
RDW: 14.6 % (ref 11.0–15.0)
WBC: 3.4 10*3/uL — ABNORMAL LOW (ref 3.8–10.8)

## 2016-02-25 LAB — COMPREHENSIVE METABOLIC PANEL
ALBUMIN: 4.3 g/dL (ref 3.6–5.1)
ALK PHOS: 59 U/L (ref 33–130)
ALT: 23 U/L (ref 6–29)
AST: 26 U/L (ref 10–35)
BILIRUBIN TOTAL: 0.4 mg/dL (ref 0.2–1.2)
BUN: 8 mg/dL (ref 7–25)
CALCIUM: 9.2 mg/dL (ref 8.6–10.4)
CO2: 22 mmol/L (ref 20–31)
Chloride: 97 mmol/L — ABNORMAL LOW (ref 98–110)
Creat: 0.75 mg/dL (ref 0.50–0.99)
Glucose, Bld: 101 mg/dL — ABNORMAL HIGH (ref 65–99)
POTASSIUM: 4.3 mmol/L (ref 3.5–5.3)
Sodium: 133 mmol/L — ABNORMAL LOW (ref 135–146)
TOTAL PROTEIN: 6.7 g/dL (ref 6.1–8.1)

## 2016-02-25 LAB — POCT URINALYSIS DIP (MANUAL ENTRY)
Bilirubin, UA: NEGATIVE
Blood, UA: NEGATIVE
GLUCOSE UA: NEGATIVE
Ketones, POC UA: NEGATIVE
Leukocytes, UA: NEGATIVE
Nitrite, UA: NEGATIVE
Protein Ur, POC: NEGATIVE
UROBILINOGEN UA: 0.2
pH, UA: 5.5

## 2016-02-25 LAB — LIPID PANEL
Cholesterol: 183 mg/dL (ref 125–200)
HDL: 86 mg/dL (ref >=46)
LDL CALC: 80 mg/dL (ref <130)
TRIGLYCERIDES: 85 mg/dL (ref <150)
Total CHOL/HDL Ratio: 2.1 Ratio (ref <=5.0)
VLDL: 17 mg/dL (ref <30)

## 2016-02-25 LAB — POC MICROSCOPIC URINALYSIS (UMFC): MUCUS RE: ABSENT

## 2016-02-25 MED ORDER — PAROXETINE HCL 20 MG PO TABS: 40.0000 mg | ORAL_TABLET | Freq: Every day | ORAL | 3 refills | Status: DC

## 2016-02-25 MED ORDER — ALPRAZOLAM 0.5 MG PO TABS: ORAL_TABLET | ORAL | 3 refills | Status: DC

## 2016-02-25 MED ORDER — DICLOFENAC SODIUM 75 MG PO TBEC: 75.0000 mg | DELAYED_RELEASE_TABLET | Freq: Two times a day (BID) | ORAL | 3 refills | Status: DC

## 2016-02-25 MED ORDER — ATORVASTATIN CALCIUM 20 MG PO TABS: 20.0000 mg | ORAL_TABLET | Freq: Every day | ORAL | 3 refills | Status: DC

## 2016-02-25 MED ORDER — ALBUTEROL SULFATE HFA 108 (90 BASE) MCG/ACT IN AERS: 2.0000 | INHALATION_SPRAY | Freq: Four times a day (QID) | RESPIRATORY_TRACT | 3 refills | Status: DC | PRN

## 2016-02-25 MED ORDER — METFORMIN HCL ER 500 MG PO TB24: 500.0000 mg | ORAL_TABLET | Freq: Two times a day (BID) | ORAL | 3 refills | Status: DC

## 2016-02-25 MED ORDER — ESOMEPRAZOLE MAGNESIUM 40 MG PO CPDR: 40.0000 mg | DELAYED_RELEASE_CAPSULE | Freq: Every day | ORAL | 3 refills | Status: DC

## 2016-02-25 MED ORDER — MISOPROSTOL 200 MCG PO TABS: ORAL_TABLET | ORAL | 5 refills | Status: DC

## 2016-02-25 NOTE — Progress Notes (Addendum)
Subjective:    Patient ID: Alisha Matthews, female    DOB: 10/21/1950, 65 y.o.   MRN: [id number]  02/25/2016  Annual Exam  By signing my name below, I, Alisha Matthews, attest that this documentation has been prepared under the direction and in the presence of [name], MD. Electronically Signed: Judithe Matthews, ER Scribe. 02/25/2016. 11:39 AM.  HPI HPI Comments: [name] is a 65 y.o. female who presents to [facility] reporting for an annual exam. Pap smear was in 2015. Alisha Matthews will get her mammogram in the next several months. Alisha Matthews got a colonoscopy in 2016. Alisha Matthews got an eye exam this year. Alisha Matthews is not using her inhaler except when Alisha Matthews has a respiratory illness. Alisha Matthews is having some increased stress so Alisha Matthews is taking more xanax. Alisha Matthews is taking care of a couple of other people's animals who were not giving them adequate care.   Alisha Matthews states Alisha Matthews has been having slight memory issues for the last year, but six months ago Alisha Matthews started taking a memory supplement and since that time her memory has been excellent.   Immunization History  Administered Date(s) Administered  . Influenza Split 03/22/2012, 04/08/2015  . Influenza,inj,Quad PF,36+ Mos 05/01/2013, 03/19/2014  . Pneumococcal Conjugate-13 07/09/2014  . Pneumococcal Polysaccharide-23 06/22/2005, 09/10/2015  . Zoster 08/01/2013    Review of Systems  Past Medical History:  Diagnosis Date  . Anemia, unspecified   . Anxiety   . Arthritis   . Asthma   . Barrett's esophagus   . Breast cancer ([place])    right breast invasive ductal carcinoma   . Breast fibrocystic disorder   . Bronchitis   . Chicken pox   . Constipation, chronic   . Depressive disorder, not elsewhere classified   . Diabetes mellitus    borderline  . Essential hypertension, benign   . Exercise induced bronchospasm   . Genital warts   . GERD (gastroesophageal reflux disease)   . Hyperlipidemia   . Infected postoperative breast seroma   . Insomnia, unspecified   .  Leukocytopenia, unspecified   . Measles   . Migraine, unspecified, without mention of intractable migraine without mention of status migrainosus   . Mumps   . Obesity, unspecified   . Osteoarthrosis, unspecified whether generalized or localized, ankle and foot    ankle/foot  . Personal history of colonic polyps   . Symptomatic menopausal or female climacteric states    Past Surgical History:  Procedure Laterality Date  . BREAST LUMPECTOMY WITH NEEDLE LOCALIZATION AND AXILLARY SENTINEL LYMPH NODE BX  06/08/2012   Procedure: BREAST LUMPECTOMY WITH NEEDLE LOCALIZATION AND AXILLARY SENTINEL LYMPH NODE BX;  Surgeon: Rolm Bookbinder, MD;  Location: [facility];  Service: General;  Laterality: Right;  . BREAST SURGERY    . BUNIONECTOMY     3 surgeries on both feet  . CHOLECYSTECTOMY    . COLONOSCOPY    . DILATION AND CURETTAGE OF UTERUS  1978  . EYE SURGERY     Orbital right eye surgery  . FOOT SURGERY     Left-revision  . JOINT REPLACEMENT    . NASAL SEPTUM SURGERY    . RE-EXCISION OF BREAST CANCER,SUPERIOR MARGINS  06/27/2012   Procedure: RE-EXCISION OF BREAST CANCER,SUPERIOR MARGINS;  Surgeon: Rolm Bookbinder, MD;  Location: [facility];  Service: General;  Laterality: Right;  . TOTAL HIP ARTHROPLASTY     left      Perthes disease  . TOTAL KNEE ARTHROPLASTY  Left 02/20/2014   Procedure: TOTAL KNEE ARTHROPLASTY;  Surgeon: Hessie Dibble, MD;  Location: [facility];  Service: Orthopedics;  Laterality: Left;   Allergies  Allergen Reactions  . Penicillins Rash   Current Outpatient Prescriptions  Medication Sig Dispense Refill  . albuterol (PROVENTIL HFA;VENTOLIN HFA) 108 (90 Base) MCG/ACT inhaler Inhale 2 puffs into the lungs every 6 (six) hours as needed. Asthmatic attack 18 g 3  . ALPRAZolam (XANAX) 0.5 MG tablet TAKE 1 TABLET BY MOUTH AT BEDTIME AS NEEDED FOR ANXIETY 30 tablet 3  . atorvastatin (LIPITOR) 20 MG tablet Take 1 tablet (20 mg total) by mouth  daily. 90 tablet 3  . Calcium-Magnesium-Vitamin D (CALCIUM 500 PO) Take 500 mg by mouth 3 (three) times daily.    . clindamycin (CLEOCIN) 150 MG capsule Take 600 mg by mouth as needed (dental work).     . Coenzyme Q10 (COQ10) 200 MG CAPS Take 200 mg by mouth daily.    . diclofenac (VOLTAREN) 75 MG EC tablet Take 1 tablet (75 mg total) by mouth 2 (two) times daily. 180 tablet 3  . Diindolylmethane POWD 1 Package by Does not apply route daily. Reported on 09/10/2015    . diphenhydrAMINE (BENADRYL) 25 MG tablet Take 25 mg by mouth at bedtime as needed for sleep. When not taking xanax.    . docusate sodium (COLACE) 100 MG capsule Take 100 mg by mouth 2 (two) times daily. Reported on 09/10/2015    . esomeprazole (NEXIUM) 40 MG capsule Take 1 capsule (40 mg total) by mouth daily. 90 capsule 3  . frovatriptan (FROVA) 2.5 MG tablet Take 2.5 mg by mouth as needed for migraine (max 3 tabs in 24 hours). If recurs, may repeat after 2 hours. Max of 3 tabs in 24 hours.    . LevOCARNitine (CARNITINE PO) Take 1 capsule by mouth daily.    Marland Kitchen lisinopril (PRINIVIL,ZESTRIL) 5 MG tablet Take 1 tablet (5 mg total) by mouth daily. 90 tablet 3  . metFORMIN (GLUCOPHAGE-XR) 500 MG 24 hr tablet Take 1 tablet (500 mg total) by mouth 2 (two) times daily. 180 tablet 3  . methocarbamol (ROBAXIN) 500 MG tablet Take 1 tablet (500 mg total) by mouth every 8 (eight) hours as needed for muscle spasms. (Patient taking differently: Take 750 mg by mouth every 8 (eight) hours as needed for muscle spasms. ) 50 tablet 0  . misoprostol (CYTOTEC) 200 MCG tablet TAKE 1 TABLET BY MOUTH 4 TIMES DAILY. 120 tablet 5  . Multiple Vitamins-Minerals (ANTIOXIDANT PO) Take 1 capsule by mouth daily. Fruit    . OVER THE COUNTER MEDICATION Take 1 capsule by mouth daily. Procast package: Elite-100 omega 3 vitamin    . OVER THE COUNTER MEDICATION Take 1 capsule by mouth daily. Procast: Memory,brain and liver vitamin    . OVER THE COUNTER MEDICATION Take 1  capsule by mouth daily. Circulation and vein support vitamin    . PARoxetine (PAXIL) 20 MG tablet Take 2 tablets (40 mg total) by mouth daily. 180 tablet 3  . PRESCRIPTION MEDICATION Reported on 09/10/2015    . Probiotic Product (PROBIOTIC DAILY PO) Take 1 capsule by mouth daily.    . simethicone (MYLICON) 80 MG chewable tablet Chew 80 mg by mouth every 6 (six) hours as needed for flatulence.    Marland Kitchen aspirin EC 325 MG EC tablet Take 1 tablet (325 mg total) by mouth 2 (two) times daily after a meal. (Patient not taking: Reported on 02/25/2016) 30 tablet 0  No current facility-administered medications for this visit.    Social History   Social History  . Marital status: Married    Spouse name: N/A  . Number of children: 0  . Years of education: college   Occupational History  . unemployed [employer]    previous [employer]  . Smoking status: Never Smoker  . Smokeless tobacco: Never Used  . Alcohol use No  . Drug use: No  . Sexual activity: No     Comment: post menopausal   Other Topics Concern  . Not on file   Social History Narrative   Marital status:  Married x 18 years happily, second marriage. No domestic abuse; 5 cats; no children.      Lives: with husband, 5 cats.      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 Walking 1-1.35miles per day      Caffeine: + Moderate caffeine use.          Seatbelt: 100%      Guns:  Unloaded gun.      Advanced Directives:  Living Will --- FULL CODE but no prolonged measures.     Family History  Problem Relation Age of Onset  . Adopted: Yes  . Hypertension Mother   . Diabetes Mother   . Heart failure Mother   . Heart disease Mother   . Hyperlipidemia Mother   . Stroke Mother   . Mental illness Mother   . Cancer Maternal Grandmother 45    non  hodgkins lymphoma  . Cancer Maternal Grandfather        Objective:    BP 120/78 (BP Location: Right Arm, Patient Position: Sitting, Cuff Size: Large)   Pulse 92   Temp 98.3 F (36.8 C) (Oral)   Resp 18   Ht 5' 2.5" (1.588 m)   Wt 168 lb (76.2 kg)   LMP 06/22/2000   SpO2 100%   BMI 30.24 kg/m  Physical Exam  Constitutional: Alisha Matthews is oriented to person, place, and time. Alisha Matthews appears well-developed and well-nourished. No distress.  HENT:  Head: Normocephalic and atraumatic.  Eyes: Conjunctivae are normal. Pupils are equal, round, and reactive to light.  Neck: Normal range of motion. Neck supple.  Cardiovascular: Normal rate, regular rhythm and normal heart sounds.  Exam reveals no gallop and no friction rub.   No murmur heard. Pulmonary/Chest: Effort normal and breath sounds normal. No respiratory distress. Alisha Matthews has no wheezes. Alisha Matthews has no rales. Right breast exhibits no inverted nipple, no mass, no nipple discharge, no skin change and no tenderness. Left breast exhibits no inverted nipple, no mass, no nipple discharge, no skin change and no tenderness. Breasts are symmetrical.  Genitourinary: Vagina normal. There is no rash, tenderness or lesion on the right labia. There is no rash, tenderness or lesion on the left labia.  Musculoskeletal: Normal range of motion.  Neurological: Alisha Matthews is alert and oriented to person, place, and time. Coordination normal.  Skin: Skin is warm and dry. Alisha Matthews is not diaphoretic.  Psychiatric: Alisha Matthews has a normal mood and affect. Her behavior is normal.  Nursing note and vitals reviewed.  Depression screen [facility] 2/9 02/25/2016 09/10/2015 04/26/2015  01/16/2015 10/15/2014  Decreased Interest 0 0 0 0 0  Down, Depressed, Hopeless 0 0 0 0 0  PHQ - 2 Score 0 0 0 0 0   Fall Risk  02/25/2016 09/10/2015 04/26/2015 10/15/2014 03/19/2014  Falls in the past year? No No No No No  Functional Status Survey: Is the patient deaf or have difficulty hearing?: No Does the patient have  difficulty seeing, even when wearing glasses/contacts?: No Does the patient have difficulty concentrating, remembering, or making decisions?: No Does the patient have difficulty walking or climbing stairs?: No Does the patient have difficulty dressing or bathing?: No Does the patient have difficulty doing errands alone such as visiting a doctor's office or shopping?: No      Assessment & Plan:   1. Medicare annual wellness visit, subsequent   2. Diabetes mellitus without complication ([place])   3. Osteopenia   4. Other pancytopenia ([place])   5. Anxiety and depression   6. Pure hypercholesterolemia   7. Breast cancer of upper-inner quadrant of right female breast ([place])   8. Cervical cancer screening     Orders Placed This Encounter  Procedures  . CBC with Differential/Platelet  . Comprehensive metabolic panel    Order Specific Question:   Has the patient fasted?    Answer:   Yes  . Hemoglobin A1c  . TSH  . Microalbumin, urine  . Lipid panel    Order Specific Question:   Has the patient fasted?    Answer:   Yes  . POCT urinalysis dipstick  . POCT Microscopic Urinalysis ([facility])   Meds ordered this encounter  Medications  . PARoxetine (PAXIL) 20 MG tablet    Sig: Take 2 tablets (40 mg total) by mouth daily.    Dispense:  180 tablet    Refill:  3  . misoprostol (CYTOTEC) 200 MCG tablet    Sig: TAKE 1 TABLET BY MOUTH 4 TIMES DAILY.    Dispense:  120 tablet    Refill:  5  . metFORMIN (GLUCOPHAGE-XR) 500 MG 24 hr tablet    Sig: Take 1 tablet (500 mg total) by mouth 2 (two) times daily.    Dispense:  180 tablet    Refill:  3  . esomeprazole (NEXIUM) 40 MG capsule    Sig: Take 1 capsule (40 mg total) by mouth daily.    Dispense:  90 capsule    Refill:  3  . diclofenac (VOLTAREN) 75 MG EC tablet    Sig: Take 1 tablet (75 mg total) by mouth 2 (two) times daily.    Dispense:  180 tablet    Refill:  3  . atorvastatin (LIPITOR) 20 MG tablet    Sig: Take 1 tablet (20 mg total) by  mouth daily.    Dispense:  90 tablet    Refill:  3  . ALPRAZolam (XANAX) 0.5 MG tablet    Sig: TAKE 1 TABLET BY MOUTH AT BEDTIME AS NEEDED FOR ANXIETY    Dispense:  30 tablet    Refill:  3  . albuterol (PROVENTIL HFA;VENTOLIN HFA) 108 (90 Base) MCG/ACT inhaler    Sig: Inhale 2 puffs into the lungs every 6 (six) hours as needed. Asthmatic attack    Dispense:  18 g    Refill:  3    Return in about 6 months (around 08/24/2016) for recheck diabetes, high cholesterol.   I personally performed the services described in this documentation, which was scribed in my presence. The recorded information has been reviewed  and considered.  Caiya Bettes Elayne Guerin, M.D. [facility] [address] [place], [place]  [address] [phone] phone [phone] fax

## 2016-02-25 NOTE — Patient Instructions (Addendum)
   IF you received an x-ray today, you will receive an invoice from [facility]. Please contact [facility] at [phone] with questions or concerns regarding your invoice.   IF you received labwork today, you will receive an invoice from [facility]. Please contact [facility] at [phone] with questions or concerns regarding your invoice.   Our billing staff will not be able to assist you with questions regarding bills from these companies.  You will be contacted with the lab results as soon as they are available. The fastest way to get your results is to activate your My Chart account. Instructions are located on the last page of this paperwork. If you have not heard from us regarding the results in 2 weeks, please contact this office.    Keeping You Healthy  Get These Tests  Blood Pressure- Have your blood pressure checked by your healthcare provider at least once a year.  Normal blood pressure is 120/80.  Weight- Have your body mass index (BMI) calculated to screen for obesity.  BMI is a measure of body fat based on height and weight.  You can calculate your own BMI at www.nhlbisupport.com/bmi/  Cholesterol- Have your cholesterol checked every year.  Diabetes- Have your blood sugar checked every year if you have high blood pressure, high cholesterol, a family history of diabetes or if you are overweight.  Pap Test - Have a pap test every 1 to 5 years if you have been sexually active.  If you are older than 65 and recent pap tests have been normal you may not need additional pap tests.  In addition, if you have had a hysterectomy  for benign disease additional pap tests are not necessary.  Mammogram-Yearly mammograms are essential for early detection of breast cancer  Screening for Colon Cancer- Colonoscopy starting at age 50. Screening may begin sooner depending on your family history and other health conditions.  Follow up colonoscopy  as directed by your Gastroenterologist.  Screening for Osteoporosis- Screening begins at age 65 with bone density scanning, sooner if you are at higher risk for developing Osteoporosis.  Get these medicines  Calcium with Vitamin D- Your body requires 1200-1500 mg of Calcium a day and 800-1000 IU of Vitamin D a day.  You can only absorb 500 mg of Calcium at a time therefore Calcium must be taken in 2 or 3 separate doses throughout the day.  Hormones- Hormone therapy has been associated with increased risk for certain cancers and heart disease.  Talk to your healthcare provider about if you need relief from menopausal symptoms.  Aspirin- Ask your healthcare provider about taking Aspirin to prevent Heart Disease and Stroke.  Get these Immuniztions  Flu shot- Every fall  Pneumonia shot- Once after the age of 65; if you are younger ask your healthcare provider if you need a pneumonia shot.  Tetanus- Every ten years.  Zostavax- Once after the age of 60 to prevent shingles.  Take these steps  Don't smoke- Your healthcare provider can help you quit. For tips on how to quit, ask your healthcare provider or go to www.smokefree.gov or call 1-800 QUIT-NOW.  Be physically active- Exercise 5 days a week for a minimum of 30 minutes.  If you are not already physically active, start slow and gradually work up to 30 minutes of moderate physical activity.  Try walking, dancing, bike riding, swimming, etc.  Eat a healthy diet- Eat a variety of healthy foods such as fruits, vegetables, whole   grains, low fat milk, low fat cheeses, yogurt, lean meats, chicken, fish, eggs, dried beans, tofu, etc.  For more information go to www.thenutritionsource.org  Dental visit- Brush and floss teeth twice daily; visit your dentist twice a year.  Eye exam- Visit your Optometrist or Ophthalmologist yearly.  Drink alcohol in moderation- Limit alcohol intake to one drink or less a day.  Never drink and  drive.  Depression- Your emotional health is as important as your physical health.  If you're feeling down or losing interest in things you normally enjoy, please talk to your healthcare provider.  Seat Belts- can save your life; always wear one  Smoke/Carbon Monoxide detectors- These detectors need to be installed on the appropriate level of your home.  Replace batteries at least once a year.  Violence- If anyone is threatening or hurting you, please tell your healthcare provider.  Living Will/ Health care power of attorney- Discuss with your healthcare provider and family. 

## 2016-02-26 LAB — PAP IG W/ RFLX HPV ASCU

## 2016-02-26 LAB — TSH: TSH: 2.39 mIU/L

## 2016-02-26 LAB — HEMOGLOBIN A1C
HEMOGLOBIN A1C: 5.7 % — AB (ref <5.7)
MEAN PLASMA GLUCOSE: 117 mg/dL

## 2016-02-26 LAB — MICROALBUMIN, URINE

## 2016-03-06 ENCOUNTER — Telehealth: Payer: Self-pay

## 2016-03-06 NOTE — Telephone Encounter (Signed)
SMITH - Pt wants to know her lab results and have them mailed to her.  [phone]

## 2016-03-09 DIAGNOSIS — Z96642 Presence of left artificial hip joint: Secondary | ICD-10-CM | POA: Diagnosis not present

## 2016-03-09 DIAGNOSIS — Z09 Encounter for follow-up examination after completed treatment for conditions other than malignant neoplasm: Secondary | ICD-10-CM | POA: Diagnosis not present

## 2016-03-09 DIAGNOSIS — M25552 Pain in left hip: Secondary | ICD-10-CM | POA: Diagnosis not present

## 2016-03-09 DIAGNOSIS — M25562 Pain in left knee: Secondary | ICD-10-CM | POA: Diagnosis not present

## 2016-03-09 DIAGNOSIS — Z96651 Presence of right artificial knee joint: Secondary | ICD-10-CM | POA: Diagnosis not present

## 2016-03-09 MED FILL — METHOCARBAMOL 750 MG TABLET: 750 | 30 days supply | Qty: 60 | Fill #0 | Status: TO

## 2016-03-17 DIAGNOSIS — J0100 Acute maxillary sinusitis, unspecified: Secondary | ICD-10-CM | POA: Diagnosis not present

## 2016-03-17 MED FILL — PARoxetine HCL 20 MG TABS: 20 | 90 days supply | Qty: 180 | Fill #0

## 2016-03-17 MED FILL — ATORVASTATIN 20 MG TABLET: 20 | 90 days supply | Qty: 90 | Fill #0

## 2016-03-17 MED FILL — miSOPROStol 200 MCG TABS: 200 | 30 days supply | Qty: 120 | Fill #0

## 2016-03-17 MED FILL — levoFLOXacin 750 MG TABS: 750 | 10 days supply | Qty: 10 | Fill #0

## 2016-03-17 MED FILL — METFORMIN HCL ER 500 MG TAB: 500 | 90 days supply | Qty: 180 | Fill #0

## 2016-03-17 MED FILL — DICLOFENAC SOD EC 75 MG TAB: 75 | 90 days supply | Qty: 180 | Fill #0

## 2016-03-17 MED FILL — FLUTICASONE PROP 50 MCG SPR: 50 | 30 days supply | Qty: 16 | Fill #0

## 2016-03-17 MED FILL — ALPRAZolam 0.5 MG TABS: 0.5 | 30 days supply | Qty: 30 | Fill #0

## 2016-03-25 ENCOUNTER — Telehealth: Payer: Self-pay | Admitting: Radiology

## 2016-03-25 NOTE — Telephone Encounter (Signed)
The patient called to request a copy of her lab tests results. I made a copy , and I mailed to her.

## 2016-04-02 ENCOUNTER — Telehealth: Payer: Self-pay

## 2016-04-02 NOTE — Telephone Encounter (Signed)
Esomeprazole not covered by insurance.  Omeprazole dexilant and pantoprazole preferred. I see that patient did try Protonix 02/20/14. Please advise /send new rx as appropriate.

## 2016-04-04 NOTE — Telephone Encounter (Signed)
Please call patient to see which she prefers among Omeprazole, Dexilant, or Protonix/pantoprazole.

## 2016-04-04 NOTE — Telephone Encounter (Signed)
Left message to call back with preferred medication

## 2016-04-07 ENCOUNTER — Telehealth: Payer: Self-pay | Admitting: Emergency Medicine

## 2016-04-07 ENCOUNTER — Other Ambulatory Visit: Payer: Self-pay | Admitting: Emergency Medicine

## 2016-04-07 ENCOUNTER — Telehealth: Payer: Self-pay

## 2016-04-07 MED ORDER — OMEPRAZOLE 40 MG PO CPDR: 40.0000 mg | DELAYED_RELEASE_CAPSULE | Freq: Every day | ORAL | 3 refills | Status: DC

## 2016-04-07 MED FILL — OMEPRAZOLE DR 40 MG CAPSULE: 40 | 90 days supply | Qty: 90 | Fill #0

## 2016-04-07 NOTE — Telephone Encounter (Signed)
error 

## 2016-04-07 NOTE — Telephone Encounter (Signed)
Pt informed Omeprazole 40 mg tab was e-scribed to [facility] outpatient pharmacy.

## 2016-04-07 NOTE — Telephone Encounter (Signed)
Patient would like Dr Tamala Julian to know that she has been taking 40mg  omeprazole over the counter for the past few months and it is working well for her. If we can get that covered/auth by insurance she would be happy with that. Please give her a call to let her know the outcome. Thank you!   [phone]

## 2016-04-07 NOTE — Telephone Encounter (Signed)
Dr. Tamala Julian, I ordered Omeprazole 40 mg tab #90, 3 refills per pt request. Medication e-scribed to pharmacy

## 2016-04-07 NOTE — Progress Notes (Unsigned)
Omeprazole

## 2016-04-08 DIAGNOSIS — Z23 Encounter for immunization: Secondary | ICD-10-CM | POA: Diagnosis not present

## 2016-04-10 NOTE — Telephone Encounter (Signed)
Noted and agree. 

## 2016-04-24 DIAGNOSIS — M545 Low back pain: Secondary | ICD-10-CM | POA: Diagnosis not present

## 2016-04-28 DIAGNOSIS — M545 Low back pain: Secondary | ICD-10-CM | POA: Diagnosis not present

## 2016-04-30 DIAGNOSIS — M545 Low back pain: Secondary | ICD-10-CM | POA: Diagnosis not present

## 2016-05-04 DIAGNOSIS — M545 Low back pain: Secondary | ICD-10-CM | POA: Diagnosis not present

## 2016-05-07 DIAGNOSIS — M545 Low back pain: Secondary | ICD-10-CM | POA: Diagnosis not present

## 2016-05-11 DIAGNOSIS — M545 Low back pain: Secondary | ICD-10-CM | POA: Diagnosis not present

## 2016-05-13 DIAGNOSIS — M545 Low back pain: Secondary | ICD-10-CM | POA: Diagnosis not present

## 2016-05-18 DIAGNOSIS — M545 Low back pain: Secondary | ICD-10-CM | POA: Diagnosis not present

## 2016-05-21 DIAGNOSIS — M545 Low back pain: Secondary | ICD-10-CM | POA: Diagnosis not present

## 2016-05-25 DIAGNOSIS — M545 Low back pain: Secondary | ICD-10-CM | POA: Diagnosis not present

## 2016-05-28 ENCOUNTER — Encounter: Payer: Self-pay | Admitting: Oncology

## 2016-05-28 DIAGNOSIS — Z853 Personal history of malignant neoplasm of breast: Secondary | ICD-10-CM | POA: Diagnosis not present

## 2016-05-29 ENCOUNTER — Encounter: Payer: Self-pay | Admitting: Family Medicine

## 2016-05-29 DIAGNOSIS — M545 Low back pain: Secondary | ICD-10-CM | POA: Diagnosis not present

## 2016-06-01 DIAGNOSIS — M545 Low back pain: Secondary | ICD-10-CM | POA: Diagnosis not present

## 2016-06-04 DIAGNOSIS — M545 Low back pain: Secondary | ICD-10-CM | POA: Diagnosis not present

## 2016-06-08 DIAGNOSIS — M545 Low back pain: Secondary | ICD-10-CM | POA: Diagnosis not present

## 2016-06-11 DIAGNOSIS — M545 Low back pain: Secondary | ICD-10-CM | POA: Diagnosis not present

## 2016-06-11 MED FILL — miSOPROStol 200 MCG TABS: 200 | 30 days supply | Qty: 120 | Fill #1

## 2016-06-11 MED FILL — ALPRAZolam 0.5 MG TABS: 0.5 | 30 days supply | Qty: 30 | Fill #1

## 2016-06-11 MED FILL — METFORMIN HCL ER 500 MG TAB: 500 | 90 days supply | Qty: 180 | Fill #1

## 2016-06-11 MED FILL — PARoxetine HCL 20 MG TABS: 20 | 90 days supply | Qty: 180 | Fill #1

## 2016-06-11 MED FILL — ATORVASTATIN 20 MG TABLET: 20 | 90 days supply | Qty: 90 | Fill #1

## 2016-06-11 MED FILL — DICLOFENAC SOD 75 MG TAB EC: 75 | 90 days supply | Qty: 180 | Fill #1

## 2016-06-18 DIAGNOSIS — M545 Low back pain: Secondary | ICD-10-CM | POA: Diagnosis not present

## 2016-06-30 DIAGNOSIS — M545 Low back pain: Secondary | ICD-10-CM | POA: Diagnosis not present

## 2016-07-02 DIAGNOSIS — M545 Low back pain: Secondary | ICD-10-CM | POA: Diagnosis not present

## 2016-07-07 DIAGNOSIS — M545 Low back pain: Secondary | ICD-10-CM | POA: Diagnosis not present

## 2016-07-13 DIAGNOSIS — M545 Low back pain: Secondary | ICD-10-CM | POA: Diagnosis not present

## 2016-07-16 MED FILL — ALPRAZolam 0.5 MG TABS: 0.5 | 30 days supply | Qty: 30 | Fill #2

## 2016-07-17 DIAGNOSIS — M545 Low back pain: Secondary | ICD-10-CM | POA: Diagnosis not present

## 2016-07-20 DIAGNOSIS — M545 Low back pain: Secondary | ICD-10-CM | POA: Diagnosis not present

## 2016-07-23 DIAGNOSIS — M545 Low back pain: Secondary | ICD-10-CM | POA: Diagnosis not present

## 2016-07-23 MED FILL — OMEPRAZOLE DR 40 MG CAPSULE: 40 | 90 days supply | Qty: 90 | Fill #1

## 2016-07-31 DIAGNOSIS — M545 Low back pain: Secondary | ICD-10-CM | POA: Diagnosis not present

## 2016-08-03 DIAGNOSIS — L2089 Other atopic dermatitis: Secondary | ICD-10-CM | POA: Diagnosis not present

## 2016-08-13 DIAGNOSIS — M545 Low back pain: Secondary | ICD-10-CM | POA: Diagnosis not present

## 2016-08-24 MED FILL — ALPRAZolam 0.5 MG TABS: 0.5 | 30 days supply | Qty: 30 | Fill #3

## 2016-08-25 ENCOUNTER — Ambulatory Visit (INDEPENDENT_AMBULATORY_CARE_PROVIDER_SITE_OTHER): Payer: Medicare Other | Admitting: Family Medicine

## 2016-08-25 ENCOUNTER — Encounter: Payer: Self-pay | Admitting: Family Medicine

## 2016-08-25 VITALS — BP 125/75 | HR 70 | Temp 98.0°F | Resp 16 | Ht 62.0 in | Wt 155.0 lb

## 2016-08-25 DIAGNOSIS — E7800 Pure hypercholesterolemia, unspecified: Secondary | ICD-10-CM | POA: Diagnosis not present

## 2016-08-25 DIAGNOSIS — Z170 Estrogen receptor positive status [ER+]: Secondary | ICD-10-CM | POA: Diagnosis not present

## 2016-08-25 DIAGNOSIS — F5104 Psychophysiologic insomnia: Secondary | ICD-10-CM

## 2016-08-25 DIAGNOSIS — F418 Other specified anxiety disorders: Secondary | ICD-10-CM

## 2016-08-25 DIAGNOSIS — E119 Type 2 diabetes mellitus without complications: Secondary | ICD-10-CM | POA: Diagnosis not present

## 2016-08-25 DIAGNOSIS — F329 Major depressive disorder, single episode, unspecified: Secondary | ICD-10-CM

## 2016-08-25 DIAGNOSIS — C50211 Malignant neoplasm of upper-inner quadrant of right female breast: Secondary | ICD-10-CM

## 2016-08-25 DIAGNOSIS — G43709 Chronic migraine without aura, not intractable, without status migrainosus: Secondary | ICD-10-CM | POA: Diagnosis not present

## 2016-08-25 DIAGNOSIS — B350 Tinea barbae and tinea capitis: Secondary | ICD-10-CM | POA: Diagnosis not present

## 2016-08-25 DIAGNOSIS — M8589 Other specified disorders of bone density and structure, multiple sites: Secondary | ICD-10-CM

## 2016-08-25 DIAGNOSIS — D61818 Other pancytopenia: Secondary | ICD-10-CM

## 2016-08-25 DIAGNOSIS — F419 Anxiety disorder, unspecified: Secondary | ICD-10-CM

## 2016-08-25 DIAGNOSIS — L219 Seborrheic dermatitis, unspecified: Secondary | ICD-10-CM

## 2016-08-25 LAB — POCT GLYCOSYLATED HEMOGLOBIN (HGB A1C): HEMOGLOBIN A1C: 6.1

## 2016-08-25 MED ORDER — FROVATRIPTAN SUCCINATE 2.5 MG PO TABS: 2.5000 mg | ORAL_TABLET | ORAL | 1 refills | Status: DC | PRN

## 2016-08-25 MED ORDER — ALPRAZOLAM 0.25 MG PO TABS: ORAL_TABLET | ORAL | 5 refills | Status: DC

## 2016-08-25 MED ORDER — TERBINAFINE HCL 250 MG PO TABS: 250.0000 mg | ORAL_TABLET | Freq: Every day | ORAL | 0 refills | Status: DC

## 2016-08-25 NOTE — Patient Instructions (Signed)
     IF you received an x-ray today, you will receive an invoice from [facility]. Please contact [facility] at [phone] with questions or concerns regarding your invoice.   IF you received labwork today, you will receive an invoice from [facility]. Please contact [facility] at [phone] with questions or concerns regarding your invoice.   Our billing staff will not be able to assist you with questions regarding bills from these companies.  You will be contacted with the lab results as soon as they are available. The fastest way to get your results is to activate your My Chart account. Instructions are located on the last page of this paperwork. If you have not heard from us regarding the results in 2 weeks, please contact this office.     

## 2016-08-25 NOTE — Progress Notes (Signed)
Subjective:    Patient ID: Alisha Matthews, female    DOB: [birth year], 66 y.o.   MRN: [id number]  08/25/2016  Follow-up   HPI This 66 y.o. female presents for DMII, hypercholesterolemia, anxiety/depression.  No changes to management made at last visit.  In past year, has gotten involved in dog rescue; increase in stress.  Concerned about recurrent fungal infection along scalp.  No one can see much of anything; does not feel like just dry scalp.  With sweating, horrible itching and burning L scalp. Went to UC in [place]; prescribed Terbenafine 250mg  one daily x 30 days; started 08/03/16.  Then prescribed shampoo 2.5% Selenium Sulfide; provided alternatives if wants to continue.  Seems some improved.  Now itchy everywhere due to winter months.  Has been taking Atorvastatin 20mg  qod while taking Terbenafine.    Bronchitis episode 05/08/16; went to UC in [place]; prescribed Levofloxacin 750mg  for one week.   Likely had influenza with secondary infection; always goes to chest.  S/p flu vaccine.    When doing dog rescue, pulled back out; [place] referred to PT.  Underwent PT for four months.  Now released last week from [facility].    Migraines: once in November; once in December; went two years without a migraine.  No longer in doggy rescue due to back injury, migraines, scalp irritation.  Felt all stress induced.  Grueling 13 months and now done.  Just got bored; passionate about dog rescue; now gardening.  Due to recent stressors, expect glucocorticoirds to be increased; expect HgbA1c to be up; increased Metformin to bid.   Requesting Frova rx.  Exercising more; 40 minutes per day on stair stepper; was walking 2 miles with dogs; also yard work.  Also renovating things in the home and crawling all over the place.  Refuses to go into any facilities. Eating really well.  Eating yogurts, kale, chicken; cereals.   Taking Paxil 20mg  one daily; had actually stopped it but restarted in past six weeks.  Stopped  Lisinopril.  Cutting 1/2 Xanax at bedtime.     Immunization History  Administered Date(s) Administered  . Influenza Split 03/22/2012, 04/08/2015  . Influenza,inj,Quad PF,36+ Mos 05/01/2013, 03/19/2014  . Influenza-Unspecified 04/20/2016  . Pneumococcal Conjugate-13 07/09/2014  . Pneumococcal Polysaccharide-23 06/22/2005, 09/10/2015  . Zoster 08/01/2013   BP Readings from Last 3 Encounters:  08/25/16 125/75  02/25/16 120/78  09/10/15 121/66   Wt Readings from Last 3 Encounters:  08/25/16 155 lb (70.3 kg)  02/25/16 168 lb (76.2 kg)  09/10/15 156 lb (70.8 kg)    Review of Systems  Constitutional: Negative for activity change, appetite change, chills, diaphoresis, fatigue, fever and unexpected weight change.  HENT: Negative for congestion, dental problem, drooling, ear discharge, ear pain, facial swelling, hearing loss, mouth sores, nosebleeds, postnasal drip, rhinorrhea, sinus pressure, sneezing, sore throat, tinnitus, trouble swallowing and voice change.   Eyes: Negative for photophobia, pain, discharge, redness, itching and visual disturbance.  Respiratory: Negative for apnea, cough, choking, chest tightness, shortness of breath, wheezing and stridor.   Cardiovascular: Negative for chest pain, palpitations and leg swelling.  Gastrointestinal: Negative for abdominal distention, abdominal pain, anal bleeding, blood in stool, constipation, diarrhea, nausea, rectal pain and vomiting.  Endocrine: Negative for cold intolerance, heat intolerance, polydipsia, polyphagia and polyuria.  Genitourinary: Negative for decreased urine volume, difficulty urinating, dyspareunia, dysuria, enuresis, flank pain, frequency, genital sores, hematuria, menstrual problem, pelvic pain, urgency, vaginal bleeding, vaginal discharge and vaginal pain.  Musculoskeletal: Negative for arthralgias, back pain,  gait problem, joint swelling, myalgias, neck pain and neck stiffness.  Skin: Negative for color change,  pallor, rash and wound.  Allergic/Immunologic: Negative for environmental allergies, food allergies and immunocompromised state.  Neurological: Negative for dizziness, tremors, seizures, syncope, facial asymmetry, speech difficulty, weakness, light-headedness, numbness and headaches.  Hematological: Negative for adenopathy. Does not bruise/bleed easily.  Psychiatric/Behavioral: Negative for agitation, behavioral problems, confusion, decreased concentration, dysphoric mood, hallucinations, self-injury, sleep disturbance and suicidal ideas. The patient is not nervous/anxious and is not hyperactive.     Past Medical History:  Diagnosis Date  . Anemia, unspecified   . Anxiety   . Arthritis   . Asthma   . Barrett's esophagus   . Breast cancer ([place])    right breast invasive ductal carcinoma   . Breast fibrocystic disorder   . Bronchitis   . Chicken pox   . Constipation, chronic   . Depressive disorder, not elsewhere classified   . Diabetes mellitus    borderline  . Essential hypertension, benign   . Exercise induced bronchospasm   . Genital warts   . GERD (gastroesophageal reflux disease)   . Hyperlipidemia   . Infected postoperative breast seroma   . Insomnia, unspecified   . Leukocytopenia, unspecified   . Measles   . Migraine, unspecified, without mention of intractable migraine without mention of status migrainosus   . Mumps   . Obesity, unspecified   . Osteoarthrosis, unspecified whether generalized or localized, ankle and foot    ankle/foot  . Personal history of colonic polyps   . Symptomatic menopausal or female climacteric states    Past Surgical History:  Procedure Laterality Date  . BREAST LUMPECTOMY WITH NEEDLE LOCALIZATION AND AXILLARY SENTINEL LYMPH NODE BX  06/08/2012   Procedure: BREAST LUMPECTOMY WITH NEEDLE LOCALIZATION AND AXILLARY SENTINEL LYMPH NODE BX;  Surgeon: Rolm Bookbinder, MD;  Location: [facility];  Service: General;  Laterality:  Right;  . BREAST SURGERY    . BUNIONECTOMY     3 surgeries on both feet  . CHOLECYSTECTOMY    . COLONOSCOPY    . DILATION AND CURETTAGE OF UTERUS  1978  . EYE SURGERY     Orbital right eye surgery  . FOOT SURGERY     Left-revision  . JOINT REPLACEMENT    . NASAL SEPTUM SURGERY    . RE-EXCISION OF BREAST CANCER,SUPERIOR MARGINS  06/27/2012   Procedure: RE-EXCISION OF BREAST CANCER,SUPERIOR MARGINS;  Surgeon: Rolm Bookbinder, MD;  Location: [facility];  Service: General;  Laterality: Right;  . TOTAL HIP ARTHROPLASTY     left      Perthes disease  . TOTAL KNEE ARTHROPLASTY Left 02/20/2014   Procedure: TOTAL KNEE ARTHROPLASTY;  Surgeon: Hessie Dibble, MD;  Location: [facility];  Service: Orthopedics;  Laterality: Left;   Allergies  Allergen Reactions  . Penicillins Rash    Social History   Social History  . Marital status: Married    Spouse name: N/A  . Number of children: 0  . Years of education: college   Occupational History  . unemployed [employer]    previous [employer] [facility] [employer]  . Smoking status: Never Smoker  . Smokeless tobacco: Never Used  . Alcohol use No  . Drug use: No  . Sexual activity: No     Comment: post menopausal   Other Topics Concern  . Not on file   Social History Narrative   Marital status:  Married x  30 years happily, second marriage. No domestic abuse; 5 cats; no children.      Lives: with husband, 5 cats.      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 Walking 1-1.28miles per day      Caffeine: + Moderate caffeine use.          Seatbelt: 100%      Guns:  Unloaded gun.      Advanced Directives:  Living Will --- FULL CODE but no prolonged measures.     Family History  Problem Relation Age of Onset  . Adopted: Yes  . Hypertension Mother   .  Diabetes Mother   . Heart failure Mother   . Heart disease Mother   . Hyperlipidemia Mother   . Stroke Mother   . Mental illness Mother   . Cancer Maternal Grandmother 67    non hodgkins lymphoma  . Cancer Maternal Grandfather        Objective:    BP 125/75   Pulse 70   Temp 98 F (36.7 C) (Oral)   Resp 16   Ht 5\' 2"  (1.575 m)   Wt 155 lb (70.3 kg)   LMP 06/22/2000   SpO2 98%   BMI 28.35 kg/m  Physical Exam  Constitutional: She is oriented to person, place, and time. She appears well-developed and well-nourished. No distress.  HENT:  Head: Normocephalic and atraumatic.  Right Ear: External ear normal.  Left Ear: External ear normal.  Nose: Nose normal.  Mouth/Throat: Oropharynx is clear and moist.  Eyes: Conjunctivae and EOM are normal. Pupils are equal, round, and reactive to light.  Neck: Normal range of motion. Neck supple. Carotid bruit is not present. No thyromegaly present.  Cardiovascular: Normal rate, regular rhythm, normal heart sounds and intact distal pulses.  Exam reveals no gallop and no friction rub.   No murmur heard. Pulmonary/Chest: Effort normal and breath sounds normal. She has no wheezes. She has no rales.  Abdominal: Soft. Bowel sounds are normal. She exhibits no distension and no mass. There is no tenderness. There is no rebound and no guarding.  Lymphadenopathy:    She has no cervical adenopathy.  Neurological: She is alert and oriented to person, place, and time. No cranial nerve deficit.  Skin: Skin is warm and dry. No rash noted. She is not diaphoretic. No erythema. No pallor.  Psychiatric: She has a normal mood and affect. Her behavior is normal.   Depression screen [facility] 2/9 08/25/2016 02/25/2016 09/10/2015 04/26/2015 01/16/2015  Decreased Interest 0 0 0 0 0  Down, Depressed, Hopeless 0 0 0 0 0  PHQ - 2 Score 0 0 0 0 0   Fall Risk  08/25/2016 02/25/2016 09/10/2015 04/26/2015 10/15/2014  Falls in the past year? No No No No No        Assessment &  Plan:   1. Diabetes mellitus without complication ([place])   2. Pure hypercholesterolemia   3. Anxiety and depression   4. Osteopenia of multiple sites   5. Seborrhea   6. Malignant neoplasm of upper-inner quadrant of right breast in female, estrogen receptor positive ([place])   7. Chronic migraine without aura without status migrainosus, not intractable   8. Psychophysiological  insomnia   9. Other pancytopenia ([place])   10. Tinea capitis    -recurrent tinea capitis with seborrhea; rx for Lamisil refill provided; continue Selsun.   -obtain labs. -continue current medications.    Orders Placed This Encounter  Procedures  . CBC with Differential/Platelet  . Comprehensive metabolic panel    Order Specific Question:   Has the patient fasted?    Answer:   Yes  . Lipid panel    Order Specific Question:   Has the patient fasted?    Answer:   Yes  . POCT glycosylated hemoglobin (Hb A1C)   Meds ordered this encounter  Medications  . DISCONTD: terbinafine (LAMISIL) 250 MG tablet    Sig: Take 250 mg by mouth daily.  Marland Kitchen selenium sulfide (SELSUN) 2.5 % shampoo    Sig: Apply 1 application topically daily as needed for irritation.  . terbinafine (LAMISIL) 250 MG tablet    Sig: Take 1 tablet (250 mg total) by mouth daily.    Dispense:  14 tablet    Refill:  0  . frovatriptan (FROVA) 2.5 MG tablet    Sig: Take 1 tablet (2.5 mg total) by mouth as needed for migraine (max 3 tabs in 24 hours). If recurs, may repeat after 2 hours. Max of 3 tabs in 24 hours.    Dispense:  10 tablet    Refill:  1  . ALPRAZolam (XANAX) 0.25 MG tablet    Sig: TAKE 1 TABLET BY MOUTH AT BEDTIME AS NEEDED FOR ANXIETY    Dispense:  35 tablet    Refill:  5    Return in about 6 months (around 02/25/2017) for complete physical examiniation.   Rayhana Slider Elayne Guerin, M.D. Primary Care at [facility] previously [facility] [place] [address] [place], [place]  [address] [phone] phone [phone]  fax

## 2016-08-26 LAB — COMPREHENSIVE METABOLIC PANEL
A/G RATIO: 1.8 (ref 1.2–2.2)
ALT: 22 IU/L (ref 0–32)
AST: 22 IU/L (ref 0–40)
Albumin: 4.1 g/dL (ref 3.6–4.8)
Alkaline Phosphatase: 57 IU/L (ref 39–117)
BUN/Creatinine Ratio: 9 — ABNORMAL LOW (ref 12–28)
BUN: 6 mg/dL — AB (ref 8–27)
Bilirubin Total: 0.2 mg/dL (ref 0.0–1.2)
CALCIUM: 9.4 mg/dL (ref 8.7–10.3)
CO2: 24 mmol/L (ref 18–29)
Chloride: 100 mmol/L (ref 96–106)
Creatinine, Ser: 0.66 mg/dL (ref 0.57–1.00)
GFR, EST AFRICAN AMERICAN: 106 mL/min/{1.73_m2} (ref >59)
GFR, EST NON AFRICAN AMERICAN: 92 mL/min/{1.73_m2} (ref >59)
GLUCOSE: 94 mg/dL (ref 65–99)
Globulin, Total: 2.3 g/dL (ref 1.5–4.5)
Potassium: 4.3 mmol/L (ref 3.5–5.2)
Sodium: 138 mmol/L (ref 134–144)
TOTAL PROTEIN: 6.4 g/dL (ref 6.0–8.5)

## 2016-08-26 LAB — CBC WITH DIFFERENTIAL/PLATELET
BASOS: 2 %
Basophils Absolute: 0.1 10*3/uL (ref 0.0–0.2)
EOS (ABSOLUTE): 0.2 10*3/uL (ref 0.0–0.4)
EOS: 5 %
Hematocrit: 33.9 % — ABNORMAL LOW (ref 34.0–46.6)
Hemoglobin: 10.7 g/dL — ABNORMAL LOW (ref 11.1–15.9)
IMMATURE GRANS (ABS): 0 10*3/uL (ref 0.0–0.1)
Immature Granulocytes: 0 %
LYMPHS: 41 %
Lymphocytes Absolute: 1.3 10*3/uL (ref 0.7–3.1)
MCH: 27.2 pg (ref 26.6–33.0)
MCHC: 31.6 g/dL (ref 31.5–35.7)
MCV: 86 fL (ref 79–97)
Monocytes Absolute: 0.3 10*3/uL (ref 0.1–0.9)
Monocytes: 9 %
Neutrophils Absolute: 1.3 10*3/uL — ABNORMAL LOW (ref 1.4–7.0)
Neutrophils: 43 %
PLATELETS: 186 10*3/uL (ref 150–379)
RBC: 3.93 x10E6/uL (ref 3.77–5.28)
RDW: 14.7 % (ref 12.3–15.4)
WBC: 3.1 10*3/uL — ABNORMAL LOW (ref 3.4–10.8)

## 2016-08-26 LAB — LIPID PANEL
CHOL/HDL RATIO: 2.4 ratio (ref 0.0–4.4)
Cholesterol, Total: 204 mg/dL — ABNORMAL HIGH (ref 100–199)
HDL: 86 mg/dL (ref >39)
LDL CALC: 107 mg/dL — AB (ref 0–99)
Triglycerides: 55 mg/dL (ref 0–149)
VLDL CHOLESTEROL CAL: 11 mg/dL (ref 5–40)

## 2016-08-27 ENCOUNTER — Telehealth: Payer: Self-pay | Admitting: Radiology

## 2016-08-27 NOTE — Telephone Encounter (Signed)
At the patient's request, I made a copy of her lab results, and I mailed to her.

## 2016-09-01 MED FILL — TERBINAFINE HCL 250 MG TAB: 250 | 14 days supply | Qty: 14 | Fill #0

## 2016-09-01 MED FILL — miSOPROStol 200 MCG TABS: 200 | 30 days supply | Qty: 120 | Fill #2

## 2016-09-15 DIAGNOSIS — G4700 Insomnia, unspecified: Secondary | ICD-10-CM | POA: Insufficient documentation

## 2016-09-15 DIAGNOSIS — G43909 Migraine, unspecified, not intractable, without status migrainosus: Secondary | ICD-10-CM | POA: Insufficient documentation

## 2016-09-28 ENCOUNTER — Other Ambulatory Visit: Payer: Self-pay | Admitting: Family Medicine

## 2016-09-28 MED FILL — ALPRAZolam 0.25 MG TABS: 0.25 | 35 days supply | Qty: 35 | Fill #0

## 2016-09-28 NOTE — Telephone Encounter (Signed)
08/25/16 last refill

## 2016-09-30 NOTE — Telephone Encounter (Signed)
Alprazolam script faxed to pharmacy Pt made aware

## 2016-10-12 MED FILL — METFORMIN HCL ER 500 MG TAB: 500 | 90 days supply | Qty: 180 | Fill #2

## 2016-10-12 MED FILL — PARoxetine HCL 20 MG TABS: 20 | 90 days supply | Qty: 90 | Fill #2

## 2016-10-12 MED FILL — ATORVASTATIN 20 MG TABLET: 20 | 90 days supply | Qty: 90 | Fill #2

## 2016-10-12 MED FILL — OMEPRAZOLE DR 40 MG CAPSULE: 40 | 90 days supply | Qty: 90 | Fill #2

## 2016-11-09 MED FILL — CLINDAMYCIN HCL 300 MG CAPS: 300 | 5 days supply | Qty: 20 | Fill #0 | Status: TO

## 2016-11-11 ENCOUNTER — Ambulatory Visit (INDEPENDENT_AMBULATORY_CARE_PROVIDER_SITE_OTHER): Payer: Medicare Other | Admitting: Family Medicine

## 2016-11-11 ENCOUNTER — Encounter: Payer: Self-pay | Admitting: Family Medicine

## 2016-11-11 VITALS — BP 138/86 | HR 78 | Temp 99.3°F | Resp 16 | Ht 66.54 in | Wt 155.2 lb

## 2016-11-11 DIAGNOSIS — F419 Anxiety disorder, unspecified: Secondary | ICD-10-CM

## 2016-11-11 DIAGNOSIS — M25441 Effusion, right hand: Secondary | ICD-10-CM | POA: Diagnosis not present

## 2016-11-11 DIAGNOSIS — K591 Functional diarrhea: Secondary | ICD-10-CM | POA: Diagnosis not present

## 2016-11-11 DIAGNOSIS — Z1211 Encounter for screening for malignant neoplasm of colon: Secondary | ICD-10-CM | POA: Diagnosis not present

## 2016-11-11 DIAGNOSIS — F5104 Psychophysiologic insomnia: Secondary | ICD-10-CM | POA: Diagnosis not present

## 2016-11-11 DIAGNOSIS — F329 Major depressive disorder, single episode, unspecified: Secondary | ICD-10-CM

## 2016-11-11 NOTE — Progress Notes (Signed)
Subjective:    Patient ID: Alisha Matthews, female    DOB: 08/17/1950, 66 y.o.   MRN: [id number]  11/11/2016  Bowel Issues (per patient, intermittant diarrhea)   HPI This 66 y.o. female presents for evaluation of diarrhea.   Eats a lot of  Yogurt.  Gets gassy and diarrhea. Had a bad bout of diarrhea. Certain foods trigger diarrhea.  No unintentional weight loss.   No melanotic stools; no bloody stools.  No abdominal cramping.  Husband underwent Cologuard so pt is considering. Last colonoscopy 08/2011.  Had diarrhea for one week.  Took Imodium x 3 doses and improved.  Certain foods will trigger diarrhea.  S/p cholecystectomy.   Scalp irritation/dermatitis: never started Lamisil.  Started using head scrubber in shower with improvement.    Insomnia: doing fine on 1/2 Xanax at 0.25; using most nights; using Benadryl once per week.  R hand second digit MCP joint:  Swelling for years; painful.  Also nodes at PIP joint; Kuzma.    BP 110/70 at home.  BP Readings from Last 3 Encounters:  11/11/16 138/86  08/25/16 125/75  02/25/16 120/78   Wt Readings from Last 3 Encounters:  11/11/16 155 lb 3.2 oz (70.4 kg)  08/25/16 155 lb (70.3 kg)  02/25/16 168 lb (76.2 kg)   Immunization History  Administered Date(s) Administered  . Influenza Split 03/22/2012, 04/08/2015  . Influenza,inj,Quad PF,36+ Mos 05/01/2013, 03/19/2014  . Influenza-Unspecified 04/20/2016  . Pneumococcal Conjugate-13 07/09/2014  . Pneumococcal Polysaccharide-23 06/22/2005, 09/10/2015  . Zoster 08/01/2013    Review of Systems  Constitutional: Positive for unexpected weight change. Negative for activity change, appetite change, chills, diaphoresis, fatigue and fever.  Eyes: Negative for visual disturbance.  Respiratory: Negative for cough and shortness of breath.   Cardiovascular: Negative for chest pain, palpitations and leg swelling.  Gastrointestinal: Positive for abdominal distention and diarrhea. Negative for abdominal  pain, constipation, nausea and vomiting.  Endocrine: Negative for cold intolerance, heat intolerance, polydipsia, polyphagia and polyuria.  Musculoskeletal: Positive for arthralgias and joint swelling.  Skin: Negative for rash.  Neurological: Negative for dizziness, tremors, seizures, syncope, facial asymmetry, speech difficulty, weakness, light-headedness, numbness and headaches.  Psychiatric/Behavioral: Positive for sleep disturbance. Negative for dysphoric mood. The patient is nervous/anxious.     Past Medical History:  Diagnosis Date  . Anemia, unspecified   . Anxiety   . Arthritis   . Asthma   . Barrett's esophagus   . Breast cancer ([place])    right breast invasive ductal carcinoma   . Breast fibrocystic disorder   . Bronchitis   . Chicken pox   . Constipation, chronic   . Depressive disorder, not elsewhere classified   . Diabetes mellitus    borderline  . Essential hypertension, benign   . Exercise induced bronchospasm   . Genital warts   . GERD (gastroesophageal reflux disease)   . Hyperlipidemia   . Infected postoperative breast seroma   . Insomnia, unspecified   . Leukocytopenia, unspecified   . Measles   . Migraine, unspecified, without mention of intractable migraine without mention of status migrainosus   . Mumps   . Obesity, unspecified   . Osteoarthrosis, unspecified whether generalized or localized, ankle and foot    ankle/foot  . Personal history of colonic polyps   . Symptomatic menopausal or female climacteric states    Past Surgical History:  Procedure Laterality Date  . BREAST LUMPECTOMY WITH NEEDLE LOCALIZATION AND AXILLARY SENTINEL LYMPH NODE BX  06/08/2012   Procedure: BREAST  LUMPECTOMY WITH NEEDLE LOCALIZATION AND AXILLARY SENTINEL LYMPH NODE BX;  Surgeon: Rolm Bookbinder, MD;  Location: [facility];  Service: General;  Laterality: Right;  . BREAST SURGERY    . BUNIONECTOMY     3 surgeries on both feet  . CHOLECYSTECTOMY    .  COLONOSCOPY    . DILATION AND CURETTAGE OF UTERUS  1978  . EYE SURGERY     Orbital right eye surgery  . FOOT SURGERY     Left-revision  . JOINT REPLACEMENT    . NASAL SEPTUM SURGERY    . RE-EXCISION OF BREAST CANCER,SUPERIOR MARGINS  06/27/2012   Procedure: RE-EXCISION OF BREAST CANCER,SUPERIOR MARGINS;  Surgeon: Rolm Bookbinder, MD;  Location: [facility];  Service: General;  Laterality: Right;  . TOTAL HIP ARTHROPLASTY     left      Perthes disease  . TOTAL KNEE ARTHROPLASTY Left 02/20/2014   Procedure: TOTAL KNEE ARTHROPLASTY;  Surgeon: Hessie Dibble, MD;  Location: [place];  Service: Orthopedics;  Laterality: Left;   Allergies  Allergen Reactions  . Penicillins Rash    Social History   Social History  . Marital status: Married    Spouse name: N/A  . Number of children: 0  . Years of education: college   Occupational History  . unemployed [place]    previous [employer] at [employer] [place]  . Smoking status: Never Smoker  . Smokeless tobacco: Never Used  . Alcohol use No  . Drug use: No  . Sexual activity: No     Comment: post menopausal   Other Topics Concern  . Not on file   Social History Narrative   Marital status:  Married x 18 years happily, second marriage. No domestic abuse; 5 cats; no children.      Lives: with husband, 5 cats.      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 Walking 1-1.93miles per day      Caffeine: + Moderate caffeine use.          Seatbelt: 100%      Guns:  Unloaded gun.      Advanced Directives:  Living Will --- FULL CODE but no prolonged measures.     Family History  Problem Relation Age of Onset  . Adopted: Yes  . Hypertension Mother   . Diabetes Mother   . Heart failure Mother   . Heart disease Mother   . Hyperlipidemia Mother   .  Stroke Mother   . Mental illness Mother   . Cancer Maternal Grandmother 91       non hodgkins lymphoma  . Cancer Maternal Grandfather        Objective:    BP 138/86 (BP Location: Left Arm, Patient Position: Sitting, Cuff Size: Large)   Pulse 78   Temp 99.3 F (37.4 C) (Oral)   Resp 16   Ht 5' 6.53" (1.69 m)   Wt 155 lb 3.2 oz (70.4 kg)   LMP 06/22/2000   BMI 24.65 kg/m  Physical Exam  Constitutional: She is oriented to person, place, and time. She appears well-developed and well-nourished. No distress.  HENT:  Head: Normocephalic and atraumatic.  Right Ear: External ear normal.  Left Ear: External ear normal.  Nose: Nose normal.  Mouth/Throat: Oropharynx is clear and moist.  Eyes: Conjunctivae and EOM are normal. Pupils are equal, round, and reactive to light.  Neck: Normal range of motion. Neck supple. Carotid bruit is not present. No thyromegaly present.  Cardiovascular: Normal rate, regular rhythm, normal heart sounds and intact distal pulses.  Exam reveals no gallop and no friction rub.   No murmur heard. Pulmonary/Chest: Effort normal and breath sounds normal. She has no wheezes. She has no rales.  Abdominal: Soft. Bowel sounds are normal. She exhibits no distension and no mass. There is no tenderness. There is no rebound and no guarding.  Musculoskeletal:  +localized swelling R second digit MCP joint without erythema or warmth.  Lymphadenopathy:    She has no cervical adenopathy.  Neurological: She is alert and oriented to person, place, and time. No cranial nerve deficit.  Skin: Skin is warm and dry. No rash noted. She is not diaphoretic. No erythema. No pallor.  Psychiatric: She has a normal mood and affect. Her behavior is normal.   Depression screen [facility] 2/9 11/11/2016 08/25/2016 02/25/2016 09/10/2015 04/26/2015  Decreased Interest 0 0 0 0 0  Down, Depressed, Hopeless 0 0 0 0 0  PHQ - 2 Score 0 0 0 0 0        Assessment & Plan:   1. Functional diarrhea   2.  Colon cancer screening   3. Swelling of finger joint of right hand   4. Psychophysiological insomnia   5. Anxiety and depression    -chronic diarrhea with acute exacerbation; recommend BRAT diet, hydration. -obtain Cologuard for colon cancer screening; last colonoscopy 2013. -new onset swelling of R MCP joint with rapid onset; refer to hand surgery; recommend rest, icing.  No evidence of septic joint or gout.   -continues to suffer with anxiety and insomnia; using Xanax several nights per week yet aware of long term side effects of benzos and considered high risk in elderly population.    Orders Placed This Encounter  Procedures  . Cologuard  . Ambulatory referral to Hand Surgery    Referral Priority:   Routine    Referral Type:   Surgical    Referral Reason:   Specialty Services Required    Requested Specialty:   Hand Surgery    Number of Visits Requested:   1   No orders of the defined types were placed in this encounter.   No Follow-up on file.   Ashlley Booher Elayne Guerin, M.D. Primary Care at [facility] previously [facility] [address] [place], [place]  [address] [phone] phone [phone] fax

## 2016-11-11 NOTE — Patient Instructions (Addendum)
IF you received an x-ray today, you will receive an invoice from [facility]. Please contact [facility] at [phone] with questions or concerns regarding your invoice.   IF you received labwork today, you will receive an invoice from [facility]. Please contact [facility] at [phone] with questions or concerns regarding your invoice.   Our billing staff will not be able to assist you with questions regarding bills from these companies.  You will be contacted with the lab results as soon as they are available. The fastest way to get your results is to activate your My Chart account. Instructions are located on the last page of this paperwork. If you have not heard from [place] regarding the results in 2 weeks, please contact this office.     Food Choices to Help Relieve Diarrhea, Adult When you have diarrhea, the foods you eat and your eating habits are very important. Choosing the right foods and drinks can help:  Relieve diarrhea.  Replace lost fluids and nutrients.  Prevent dehydration. What general guidelines should I follow? Relieving diarrhea   Choose foods with less than 2 g or .07 oz. of fiber per serving.  Limit fats to less than 8 tsp (38 g or 1.34 oz.) a day.  Avoid the following:  Foods and beverages sweetened with high-fructose corn syrup, honey, or sugar alcohols such as xylitol, sorbitol, and mannitol.  Foods that contain a lot of fat or sugar.  Fried, greasy, or spicy foods.  High-fiber grains, breads, and cereals.  Raw fruits and vegetables.  Eat foods that are rich in probiotics. These foods include dairy products such as yogurt and fermented milk products. They help increase healthy bacteria in the stomach and intestines (gastrointestinal tract, or GI tract).  If you have lactose intolerance, avoid dairy products. These may make your diarrhea worse.  Take medicine to help stop diarrhea (antidiarrheal medicine) only as told by your  health care provider. Replacing nutrients   Eat small meals or snacks every 3-4 hours.  Eat bland foods, such as white rice, toast, or baked potato, until your diarrhea starts to get better. Gradually reintroduce nutrient-rich foods as tolerated or as told by your health care provider. This includes:  Well-cooked protein foods.  Peeled, seeded, and soft-cooked fruits and vegetables.  Low-fat dairy products.  Take vitamin and mineral supplements as told by your health care provider. Preventing dehydration    Start by sipping water or a special solution to prevent dehydration (oral rehydration solution, ORS). Urine that is clear or pale yellow means that you are getting enough fluid.  Try to drink at least 8-10 cups of fluid each day to help replace lost fluids.  You may add other liquids in addition to water, such as clear juice or decaffeinated sports drinks, as tolerated or as told by your health care provider.  Avoid drinks with caffeine, such as coffee, tea, or soft drinks.  Avoid alcohol. What foods are recommended? The items listed may not be a complete list. Talk with your health care provider about what dietary choices are best for you. Grains  White rice. White, [place], or pita breads (fresh or toasted), including plain rolls, buns, or bagels. White pasta. Saltine, soda, or graham crackers. Pretzels. Low-fiber cereal. Cooked cereals made with water (such as cornmeal, farina, or cream cereals). Plain muffins. Matzo. Melba toast. Zwieback. Vegetables  Potatoes (without the skin). Most well-cooked and canned vegetables without skins or seeds. Tender lettuce. Fruits  Apple sauce. Fruits canned  in juice. Cooked apricots, cherries, grapefruit, peaches, pears, or plums. Fresh bananas and cantaloupe. Meats and other protein foods  Baked or boiled chicken. Eggs. Tofu. Fish. Seafood. Smooth nut butters. Ground or well-cooked tender beef, ham, veal, lamb, pork, or poultry. Dairy   Plain yogurt, kefir, and unsweetened liquid yogurt. Lactose-free milk, buttermilk, skim milk, or soy milk. Low-fat or nonfat hard cheese. Beverages  Water. Low-calorie sports drinks. Fruit juices without pulp. Strained tomato and vegetable juices. Decaffeinated teas. Sugar-free beverages not sweetened with sugar alcohols. Oral rehydration solutions, if approved by your health care provider. Seasoning and other foods  Bouillon, broth, or soups made from recommended foods. What foods are not recommended? The items listed may not be a complete list. Talk with your health care provider about what dietary choices are best for you. Grains  Whole grain, whole wheat, bran, or rye breads, rolls, pastas, and crackers. Wild or brown rice. Whole grain or bran cereals. Barley. Oats and oatmeal. Corn tortillas or taco shells. Granola. Popcorn. Vegetables  Raw vegetables. Fried vegetables. Cabbage, broccoli, Brussels sprouts, artichokes, baked beans, beet greens, corn, kale, legumes, peas, sweet potatoes, and yams. Potato skins. Cooked spinach and cabbage. Fruits  Dried fruit, including raisins and dates. Raw fruits. Stewed or dried prunes. Canned fruits with syrup. Meat and other protein foods  Fried or fatty meats. Deli meats. Chunky nut butters. Nuts and seeds. Beans and lentils. Berniece Salines. Hot dogs. Sausage. Dairy  High-fat cheeses. Whole milk, chocolate milk, and beverages made with milk, such as milk shakes. Half-and-half. Cream. sour cream. Ice cream. Beverages  Caffeinated beverages (such as coffee, tea, soda, or energy drinks). Alcoholic beverages. Fruit juices with pulp. Prune juice. Soft drinks sweetened with high-fructose corn syrup or sugar alcohols. High-calorie sports drinks. Fats and oils  Butter. Cream sauces. Margarine. Salad oils. Plain salad dressings. Olives. Avocados. Mayonnaise. Sweets and desserts  Sweet rolls, doughnuts, and sweet breads. Sugar-free desserts sweetened with sugar  alcohols such as xylitol and sorbitol. Seasoning and other foods  Honey. Hot sauce. Chili powder. Gravy. Cream-based or milk-based soups. Pancakes and waffles. Summary  When you have diarrhea, the foods you eat and your eating habits are very important.  Make sure you get at least 8-10 cups of fluid each day, or enough to keep your urine clear or pale yellow.  Eat bland foods and gradually reintroduce healthy, nutrient-rich foods as tolerated, or as told by your health care provider.  Avoid high-fiber, fried, greasy, or spicy foods. This information is not intended to replace advice given to you by your health care provider. Make sure you discuss any questions you have with your health care provider. Document Released: 08/29/2003 Document Revised: 06/05/2016 Document Reviewed: 06/05/2016 Elsevier Interactive Patient Education  2017 [employer].

## 2016-11-12 ENCOUNTER — Telehealth: Payer: Self-pay | Admitting: *Deleted

## 2016-11-12 NOTE — Telephone Encounter (Signed)
Faxed Cologuard completed and signed forms to [employer] on 11/11/2016. Confirmation page received at 7:26 pm.

## 2016-12-11 NOTE — Telephone Encounter (Signed)
Entry error

## 2016-12-16 MED FILL — miSOPROStol 200 MCG TABS: 200 | 30 days supply | Qty: 120 | Fill #3

## 2016-12-16 MED FILL — DICLOFENAC SOD 75 MG TAB EC: 75 | 90 days supply | Qty: 180 | Fill #2

## 2016-12-16 MED FILL — ALPRAZolam 0.25 MG TABS: 0.25 | 35 days supply | Qty: 35 | Fill #1

## 2017-01-14 MED FILL — OMEPRAZOLE DR 40 MG CAPSULE: 40 | 90 days supply | Qty: 90 | Fill #3 | Status: TO

## 2017-02-01 DIAGNOSIS — M79641 Pain in right hand: Secondary | ICD-10-CM | POA: Diagnosis not present

## 2017-02-01 DIAGNOSIS — M79642 Pain in left hand: Secondary | ICD-10-CM | POA: Diagnosis not present

## 2017-02-01 DIAGNOSIS — M19042 Primary osteoarthritis, left hand: Secondary | ICD-10-CM | POA: Diagnosis not present

## 2017-02-01 DIAGNOSIS — M180 Bilateral primary osteoarthritis of first carpometacarpal joints: Secondary | ICD-10-CM | POA: Diagnosis not present

## 2017-02-01 DIAGNOSIS — M19041 Primary osteoarthritis, right hand: Secondary | ICD-10-CM | POA: Diagnosis not present

## 2017-02-03 DIAGNOSIS — E119 Type 2 diabetes mellitus without complications: Secondary | ICD-10-CM | POA: Diagnosis not present

## 2017-02-04 MED FILL — ALPRAZolam 0.25 MG TABS: 0.25 | 35 days supply | Qty: 35 | Fill #2

## 2017-02-08 DIAGNOSIS — M25541 Pain in joints of right hand: Secondary | ICD-10-CM | POA: Diagnosis not present

## 2017-02-08 DIAGNOSIS — M19041 Primary osteoarthritis, right hand: Secondary | ICD-10-CM | POA: Diagnosis not present

## 2017-02-08 DIAGNOSIS — M25542 Pain in joints of left hand: Secondary | ICD-10-CM | POA: Diagnosis not present

## 2017-02-08 DIAGNOSIS — M19042 Primary osteoarthritis, left hand: Secondary | ICD-10-CM | POA: Diagnosis not present

## 2017-02-10 DIAGNOSIS — M25542 Pain in joints of left hand: Secondary | ICD-10-CM | POA: Diagnosis not present

## 2017-02-10 DIAGNOSIS — M19041 Primary osteoarthritis, right hand: Secondary | ICD-10-CM | POA: Diagnosis not present

## 2017-02-10 DIAGNOSIS — M19042 Primary osteoarthritis, left hand: Secondary | ICD-10-CM | POA: Diagnosis not present

## 2017-02-10 DIAGNOSIS — M25541 Pain in joints of right hand: Secondary | ICD-10-CM | POA: Diagnosis not present

## 2017-02-12 DIAGNOSIS — M25542 Pain in joints of left hand: Secondary | ICD-10-CM | POA: Diagnosis not present

## 2017-02-12 DIAGNOSIS — M19042 Primary osteoarthritis, left hand: Secondary | ICD-10-CM | POA: Diagnosis not present

## 2017-02-12 DIAGNOSIS — M25541 Pain in joints of right hand: Secondary | ICD-10-CM | POA: Diagnosis not present

## 2017-02-12 DIAGNOSIS — M19041 Primary osteoarthritis, right hand: Secondary | ICD-10-CM | POA: Diagnosis not present

## 2017-02-15 DIAGNOSIS — M25541 Pain in joints of right hand: Secondary | ICD-10-CM | POA: Diagnosis not present

## 2017-02-15 DIAGNOSIS — M19042 Primary osteoarthritis, left hand: Secondary | ICD-10-CM | POA: Diagnosis not present

## 2017-02-15 DIAGNOSIS — M25542 Pain in joints of left hand: Secondary | ICD-10-CM | POA: Diagnosis not present

## 2017-02-15 DIAGNOSIS — M19041 Primary osteoarthritis, right hand: Secondary | ICD-10-CM | POA: Diagnosis not present

## 2017-02-17 DIAGNOSIS — M19041 Primary osteoarthritis, right hand: Secondary | ICD-10-CM | POA: Diagnosis not present

## 2017-02-17 DIAGNOSIS — M19042 Primary osteoarthritis, left hand: Secondary | ICD-10-CM | POA: Diagnosis not present

## 2017-02-17 DIAGNOSIS — M25541 Pain in joints of right hand: Secondary | ICD-10-CM | POA: Diagnosis not present

## 2017-02-17 DIAGNOSIS — M25542 Pain in joints of left hand: Secondary | ICD-10-CM | POA: Diagnosis not present

## 2017-02-19 DIAGNOSIS — M25541 Pain in joints of right hand: Secondary | ICD-10-CM | POA: Diagnosis not present

## 2017-02-19 DIAGNOSIS — M25542 Pain in joints of left hand: Secondary | ICD-10-CM | POA: Diagnosis not present

## 2017-02-19 DIAGNOSIS — M19041 Primary osteoarthritis, right hand: Secondary | ICD-10-CM | POA: Diagnosis not present

## 2017-02-19 DIAGNOSIS — M19042 Primary osteoarthritis, left hand: Secondary | ICD-10-CM | POA: Diagnosis not present

## 2017-02-23 DIAGNOSIS — M19042 Primary osteoarthritis, left hand: Secondary | ICD-10-CM | POA: Diagnosis not present

## 2017-02-23 DIAGNOSIS — M25542 Pain in joints of left hand: Secondary | ICD-10-CM | POA: Diagnosis not present

## 2017-02-23 DIAGNOSIS — M25541 Pain in joints of right hand: Secondary | ICD-10-CM | POA: Diagnosis not present

## 2017-02-23 DIAGNOSIS — M19041 Primary osteoarthritis, right hand: Secondary | ICD-10-CM | POA: Diagnosis not present

## 2017-02-24 DIAGNOSIS — Z96642 Presence of left artificial hip joint: Secondary | ICD-10-CM | POA: Diagnosis not present

## 2017-02-24 DIAGNOSIS — Z96652 Presence of left artificial knee joint: Secondary | ICD-10-CM | POA: Diagnosis not present

## 2017-02-25 DIAGNOSIS — M19041 Primary osteoarthritis, right hand: Secondary | ICD-10-CM | POA: Diagnosis not present

## 2017-02-25 DIAGNOSIS — M25542 Pain in joints of left hand: Secondary | ICD-10-CM | POA: Diagnosis not present

## 2017-02-25 DIAGNOSIS — M19042 Primary osteoarthritis, left hand: Secondary | ICD-10-CM | POA: Diagnosis not present

## 2017-02-25 DIAGNOSIS — M25541 Pain in joints of right hand: Secondary | ICD-10-CM | POA: Diagnosis not present

## 2017-02-26 DIAGNOSIS — M19041 Primary osteoarthritis, right hand: Secondary | ICD-10-CM | POA: Diagnosis not present

## 2017-02-26 DIAGNOSIS — M19042 Primary osteoarthritis, left hand: Secondary | ICD-10-CM | POA: Diagnosis not present

## 2017-02-26 DIAGNOSIS — M25541 Pain in joints of right hand: Secondary | ICD-10-CM | POA: Diagnosis not present

## 2017-02-26 DIAGNOSIS — M25542 Pain in joints of left hand: Secondary | ICD-10-CM | POA: Diagnosis not present

## 2017-03-01 DIAGNOSIS — M25542 Pain in joints of left hand: Secondary | ICD-10-CM | POA: Diagnosis not present

## 2017-03-01 DIAGNOSIS — M25541 Pain in joints of right hand: Secondary | ICD-10-CM | POA: Diagnosis not present

## 2017-03-01 DIAGNOSIS — M19042 Primary osteoarthritis, left hand: Secondary | ICD-10-CM | POA: Diagnosis not present

## 2017-03-01 DIAGNOSIS — M19041 Primary osteoarthritis, right hand: Secondary | ICD-10-CM | POA: Diagnosis not present

## 2017-03-02 ENCOUNTER — Encounter: Payer: Self-pay | Admitting: Family Medicine

## 2017-03-02 ENCOUNTER — Ambulatory Visit (INDEPENDENT_AMBULATORY_CARE_PROVIDER_SITE_OTHER): Payer: Medicare Other | Admitting: Family Medicine

## 2017-03-02 DIAGNOSIS — M79641 Pain in right hand: Secondary | ICD-10-CM

## 2017-03-02 DIAGNOSIS — Z170 Estrogen receptor positive status [ER+]: Secondary | ICD-10-CM

## 2017-03-02 DIAGNOSIS — F32A Depression, unspecified: Secondary | ICD-10-CM

## 2017-03-02 DIAGNOSIS — Z0000 Encounter for general adult medical examination without abnormal findings: Secondary | ICD-10-CM

## 2017-03-02 DIAGNOSIS — F329 Major depressive disorder, single episode, unspecified: Secondary | ICD-10-CM | POA: Diagnosis not present

## 2017-03-02 DIAGNOSIS — C50211 Malignant neoplasm of upper-inner quadrant of right female breast: Secondary | ICD-10-CM

## 2017-03-02 DIAGNOSIS — E7800 Pure hypercholesterolemia, unspecified: Secondary | ICD-10-CM

## 2017-03-02 DIAGNOSIS — M8589 Other specified disorders of bone density and structure, multiple sites: Secondary | ICD-10-CM

## 2017-03-02 DIAGNOSIS — F5104 Psychophysiologic insomnia: Secondary | ICD-10-CM | POA: Diagnosis not present

## 2017-03-02 DIAGNOSIS — E119 Type 2 diabetes mellitus without complications: Secondary | ICD-10-CM

## 2017-03-02 DIAGNOSIS — D61818 Other pancytopenia: Secondary | ICD-10-CM | POA: Diagnosis not present

## 2017-03-02 DIAGNOSIS — E2839 Other primary ovarian failure: Secondary | ICD-10-CM

## 2017-03-02 DIAGNOSIS — G43709 Chronic migraine without aura, not intractable, without status migrainosus: Secondary | ICD-10-CM | POA: Diagnosis not present

## 2017-03-02 DIAGNOSIS — F419 Anxiety disorder, unspecified: Secondary | ICD-10-CM

## 2017-03-02 DIAGNOSIS — M79642 Pain in left hand: Secondary | ICD-10-CM

## 2017-03-02 LAB — POCT URINALYSIS DIP (MANUAL ENTRY)
BILIRUBIN UA: NEGATIVE mg/dL
Bilirubin, UA: NEGATIVE
Blood, UA: NEGATIVE
Glucose, UA: NEGATIVE mg/dL
LEUKOCYTES UA: NEGATIVE
NITRITE UA: NEGATIVE
PH UA: 7 (ref 5.0–8.0)
PROTEIN UA: NEGATIVE mg/dL
Spec Grav, UA: 1.01 (ref 1.010–1.025)
Urobilinogen, UA: 0.2 E.U./dL

## 2017-03-02 LAB — POCT GLYCOSYLATED HEMOGLOBIN (HGB A1C): Hemoglobin A1C: 6

## 2017-03-02 MED ORDER — PAROXETINE HCL 20 MG PO TABS: 40.0000 mg | ORAL_TABLET | Freq: Every day | ORAL | 3 refills | Status: DC

## 2017-03-02 MED ORDER — ATORVASTATIN CALCIUM 20 MG PO TABS: 20.0000 mg | ORAL_TABLET | Freq: Every day | ORAL | 3 refills | Status: DC

## 2017-03-02 MED ORDER — ZOSTER VAC RECOMB ADJUVANTED 50 MCG/0.5ML IM SUSR: 0.5000 mL | Freq: Once | INTRAMUSCULAR | 1 refills | Status: AC

## 2017-03-02 MED ORDER — ALPRAZOLAM 0.5 MG PO TABS: ORAL_TABLET | ORAL | 3 refills | Status: DC

## 2017-03-02 MED ORDER — DICLOFENAC SODIUM 75 MG PO TBEC: 75.0000 mg | DELAYED_RELEASE_TABLET | Freq: Two times a day (BID) | ORAL | 3 refills | Status: DC

## 2017-03-02 MED ORDER — ESOMEPRAZOLE MAGNESIUM 40 MG PO CPDR: 40.0000 mg | DELAYED_RELEASE_CAPSULE | Freq: Every day | ORAL | 3 refills | Status: DC

## 2017-03-02 MED ORDER — MISOPROSTOL 200 MCG PO TABS: ORAL_TABLET | ORAL | 5 refills | Status: DC

## 2017-03-02 MED ORDER — METFORMIN HCL ER 500 MG PO TB24: 500.0000 mg | ORAL_TABLET | Freq: Two times a day (BID) | ORAL | 3 refills | Status: DC

## 2017-03-02 MED ORDER — FROVATRIPTAN SUCCINATE 2.5 MG PO TABS: 2.5000 mg | ORAL_TABLET | ORAL | 1 refills | Status: DC | PRN

## 2017-03-02 NOTE — Patient Instructions (Addendum)
   IF you received an x-ray today, you will receive an invoice from [facility]. Please contact [facility] at [phone] with questions or concerns regarding your invoice.   IF you received labwork today, you will receive an invoice from [facility]. Please contact [facility] at [phone] with questions or concerns regarding your invoice.   Our billing staff will not be able to assist you with questions regarding bills from these companies.  You will be contacted with the lab results as soon as they are available. The fastest way to get your results is to activate your My Chart account. Instructions are located on the last page of this paperwork. If you have not heard from us regarding the results in 2 weeks, please contact this office.      Preventive Care 65 Years and Older, Female Preventive care refers to lifestyle choices and visits with your health care provider that can promote health and wellness. What does preventive care include?  A yearly physical exam. This is also called an annual well check.  Dental exams once or twice a year.  Routine eye exams. Ask your health care provider how often you should have your eyes checked.  Personal lifestyle choices, including: ? Daily care of your teeth and gums. ? Regular physical activity. ? Eating a healthy diet. ? Avoiding tobacco and drug use. ? Limiting alcohol use. ? Practicing safe sex. ? Taking low-dose aspirin every day. ? Taking vitamin and mineral supplements as recommended by your health care provider. What happens during an annual well check? The services and screenings done by your health care provider during your annual well check will depend on your age, overall health, lifestyle risk factors, and family history of disease. Counseling Your health care provider may ask you questions about your:  Alcohol use.  Tobacco use.  Drug use.  Emotional well-being.  Home and relationship  well-being.  Sexual activity.  Eating habits.  History of falls.  Memory and ability to understand (cognition).  Work and work environment.  Reproductive health.  Screening You may have the following tests or measurements:  Height, weight, and BMI.  Blood pressure.  Lipid and cholesterol levels. These may be checked every 5 years, or more frequently if you are over 50 years old.  Skin check.  Lung cancer screening. You may have this screening every year starting at age 55 if you have a 30-pack-year history of smoking and currently smoke or have quit within the past 15 years.  Fecal occult blood test (FOBT) of the stool. You may have this test every year starting at age 50.  Flexible sigmoidoscopy or colonoscopy. You may have a sigmoidoscopy every 5 years or a colonoscopy every 10 years starting at age 50.  Hepatitis C blood test.  Hepatitis B blood test.  Sexually transmitted disease (STD) testing.  Diabetes screening. This is done by checking your blood sugar (glucose) after you have not eaten for a while (fasting). You may have this done every 1-3 years.  Bone density scan. This is done to screen for osteoporosis. You may have this done starting at age 65.  Mammogram. This may be done every 1-2 years. Talk to your health care provider about how often you should have regular mammograms.  Talk with your health care provider about your test results, treatment options, and if necessary, the need for more tests. Vaccines Your health care provider may recommend certain vaccines, such as:  Influenza vaccine. This is recommended every year.    Tetanus, diphtheria, and acellular pertussis (Tdap, Td) vaccine. You may need a Td booster every 10 years.  Varicella vaccine. You may need this if you have not been vaccinated.  Zoster vaccine. You may need this after age 60.  Measles, mumps, and rubella (MMR) vaccine. You may need at least one dose of MMR if you were born in  1959 or later. You may also need a second dose.  Pneumococcal 13-valent conjugate (PCV13) vaccine. One dose is recommended after age 65.  Pneumococcal polysaccharide (PPSV23) vaccine. One dose is recommended after age 65.  Meningococcal vaccine. You may need this if you have certain conditions.  Hepatitis A vaccine. You may need this if you have certain conditions or if you travel or work in places where you may be exposed to hepatitis A.  Hepatitis B vaccine. You may need this if you have certain conditions or if you travel or work in places where you may be exposed to hepatitis B.  Haemophilus influenzae type b (Hib) vaccine. You may need this if you have certain conditions.  Talk to your health care provider about which screenings and vaccines you need and how often you need them. This information is not intended to replace advice given to you by your health care provider. Make sure you discuss any questions you have with your health care provider. Document Released: 07/05/2015 Document Revised: 02/26/2016 Document Reviewed: 04/09/2015 Elsevier Interactive Patient Education  2017 [employer]  

## 2017-03-02 NOTE — Progress Notes (Signed)
Subjective:    Patient ID: Alisha Matthews, female    DOB: 01/12/1951, 66 y.o.   MRN: [id number]  03/02/2017  Medicare Wellness   HPI This 66 y.o. female presents for evaluation of Annual Wellness Examination and chronic medical conditions including DMII, hypercholesterolemia, migraines, anxiety/depression, GERD.  Last physical: Pap smear:  02/25/2016 WNL negative. Mammogram:cleared by [facility]; no follow-up warranted. Colonoscopy: Cologuard at home and needs completion. Bone density:  06-06-2015 Eye exam:  2018 Dental exam:   Will get flu vaccine on 04/05/17 at [facility].    Visual Acuity Screening   Right eye Left eye Both eyes  Without correction: 20/25 20/20 20/20   With correction:       BP Readings from Last 3 Encounters:  11/11/16 138/86  08/25/16 125/75  02/25/16 120/78   Wt Readings from Last 3 Encounters:  11/11/16 155 lb 3.2 oz (70.4 kg)  08/25/16 155 lb (70.3 kg)  02/25/16 168 lb (76.2 kg)   Immunization History  Administered Date(s) Administered  . Influenza Split 03/22/2012, 04/08/2015  . Influenza,inj,Quad PF,6+ Mos 05/01/2013, 03/19/2014  . Influenza-Unspecified 04/20/2016  . Pneumococcal Conjugate-13 07/09/2014  . Pneumococcal Polysaccharide-23 06/22/2005, 09/10/2015  . Zoster 08/01/2013   Alisha Matthews retired 12/25/16 and traveled out [place] for three weeks. Ordered cologuard in May and not completed; called company and good for one year.  Plans to complete.  R hand pain: severe osteoarthritis; two options include joint replacement at [facility]; less than 50% mobility in dominant hand. Currently has 100% mobility; going to [facility] for hand ROM. Losing muscle mass in RIGHT hand; having husband do all opening of jars.  Upper body strength.Having pain with PT.  Improving upper body strength.   Diclofenac 3%/Baclofen @5 /Cyclobenzaprine 2%/Lidocaine 2% in a lipoderm cream base.  No surgery.  Plans to live with pain.    DMII: Patient reports good compliance with medication,  good tolerance to medication, and good symptom control.    Hypercholesterolemia: Patient reports good compliance with medication, good tolerance to medication, and good symptom control.    Anxiety/depression: Patient reports good compliance with medication, good tolerance to medication, and good symptom control.  Doing really well emotionally.  Really happy.  GERD: Patient reports good compliance with medication, good tolerance to medication, and good symptom control.     Review of Systems  Constitutional: Negative for activity change, appetite change, chills, diaphoresis, fatigue, fever and unexpected weight change.  HENT: Negative for congestion, dental problem, drooling, ear discharge, ear pain, facial swelling, hearing loss, mouth sores, nosebleeds, postnasal drip, rhinorrhea, sinus pressure, sneezing, sore throat, tinnitus, trouble swallowing and voice change.   Eyes: Negative for photophobia, pain, discharge, redness, itching and visual disturbance.  Respiratory: Negative for apnea, cough, choking, chest tightness, shortness of breath, wheezing and stridor.   Cardiovascular: Negative for chest pain, palpitations and leg swelling.  Gastrointestinal: Negative for abdominal distention, abdominal pain, anal bleeding, blood in stool, constipation, diarrhea, nausea, rectal pain and vomiting.  Endocrine: Negative for cold intolerance, heat intolerance, polydipsia, polyphagia and polyuria.  Genitourinary: Negative for decreased urine volume, difficulty urinating, dyspareunia, dysuria, enuresis, flank pain, frequency, genital sores, hematuria, menstrual problem, pelvic pain, urgency, vaginal bleeding, vaginal discharge and vaginal pain.  Musculoskeletal: Positive for arthralgias. Negative for back pain, gait problem, joint swelling, myalgias, neck pain and neck stiffness.  Skin: Negative for color change, pallor, rash and wound.  Allergic/Immunologic: Negative for environmental allergies, food  allergies and immunocompromised state.  Neurological: Negative for dizziness, tremors, seizures, syncope, facial asymmetry, speech difficulty,  weakness, light-headedness, numbness and headaches.  Hematological: Negative for adenopathy. Does not bruise/bleed easily.  Psychiatric/Behavioral: Negative for agitation, behavioral problems, confusion, decreased concentration, dysphoric mood, hallucinations, self-injury, sleep disturbance and suicidal ideas. The patient is not nervous/anxious and is not hyperactive.     Past Medical History:  Diagnosis Date  . Anemia, unspecified   . Anxiety   . Arthritis   . Asthma   . Barrett's esophagus   . Breast cancer ([place])    right breast invasive ductal carcinoma   . Breast fibrocystic disorder   . Bronchitis   . Chicken pox   . Constipation, chronic   . Depressive disorder, not elsewhere classified   . Diabetes mellitus    borderline  . Essential hypertension, benign   . Exercise induced bronchospasm   . Genital warts   . GERD (gastroesophageal reflux disease)   . Hyperlipidemia   . Infected postoperative breast seroma   . Insomnia, unspecified   . Leukocytopenia, unspecified   . Measles   . Migraine, unspecified, without mention of intractable migraine without mention of status migrainosus   . Mumps   . Obesity, unspecified   . Osteoarthrosis, unspecified whether generalized or localized, ankle and foot    ankle/foot  . Personal history of colonic polyps   . Symptomatic menopausal or female climacteric states    Past Surgical History:  Procedure Laterality Date  . BREAST LUMPECTOMY WITH NEEDLE LOCALIZATION AND AXILLARY SENTINEL LYMPH NODE BX  06/08/2012   Procedure: BREAST LUMPECTOMY WITH NEEDLE LOCALIZATION AND AXILLARY SENTINEL LYMPH NODE BX;  Surgeon: Rolm Bookbinder, MD;  Location: [facility];  Service: General;  Laterality: Right;  . BREAST SURGERY    . BUNIONECTOMY     3 surgeries on both feet  . CHOLECYSTECTOMY     . COLONOSCOPY    . DILATION AND CURETTAGE OF UTERUS  1978  . EYE SURGERY     Orbital right eye surgery  . FOOT SURGERY     Left-revision  . JOINT REPLACEMENT    . NASAL SEPTUM SURGERY    . RE-EXCISION OF BREAST CANCER,SUPERIOR MARGINS  06/27/2012   Procedure: RE-EXCISION OF BREAST CANCER,SUPERIOR MARGINS;  Surgeon: Rolm Bookbinder, MD;  Location: [facility];  Service: General;  Laterality: Right;  . TOTAL HIP ARTHROPLASTY     left      Perthes disease  . TOTAL KNEE ARTHROPLASTY Left 02/20/2014   Procedure: TOTAL KNEE ARTHROPLASTY;  Surgeon: Hessie Dibble, MD;  Location: [facility];  Service: Orthopedics;  Laterality: Left;   Allergies  Allergen Reactions  . Penicillins Rash    Social History   Social History  . Marital status: Married    Spouse name: Alisha Matthews  . Number of children: 0  . Years of education: college   Occupational History  . retired [employer]    previous [place] at [place]  . Smoking status: Never Smoker  . Smokeless tobacco: Never Used  . Alcohol use No  . Drug use: No  . Sexual activity: No     Comment: post menopausal   Other Topics Concern  . Not on file   Social History Narrative   Marital status:  Married x 19 years happily, second marriage. No domestic abuse; 5 cats; no children.      Lives: with husband, 5 cats.      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      Tobacco: none       Alcohol: none      Drugs: none      Exercise:  Stair stepper x 30 minutes five days per week.  Spin gym weights daily.  Gardening.      Caffeine: + Moderate caffeine use.          Seatbelt: 100%; no texting while driving.      Guns:  Unloaded gun.      Advanced Directives:  Living Will --- FULL CODE but no prolonged measures.        ADLs: independent with ADLs; no assistant devices; drives.   Family History  Problem Relation Age of Onset  . Adopted: Yes  . Hypertension  Mother   . Diabetes Mother   . Heart failure Mother   . Heart disease Mother   . Hyperlipidemia Mother   . Stroke Mother   . Mental illness Mother   . Cancer Maternal Grandmother 31       non hodgkins lymphoma  . Cancer Maternal Grandfather   . Deep vein thrombosis Brother        Objective:    LMP 06/22/2000  Physical Exam  Constitutional: She is oriented to person, place, and time. She appears well-developed and well-nourished. No distress.  HENT:  Head: Normocephalic and atraumatic.  Right Ear: External ear normal.  Left Ear: External ear normal.  Nose: Nose normal.  Mouth/Throat: Oropharynx is clear and moist.  Eyes: Pupils are equal, round, and reactive to light. Conjunctivae and EOM are normal.  Neck: Normal range of motion and full passive range of motion without pain. Neck supple. No JVD present. Carotid bruit is not present. No thyromegaly present.  Cardiovascular: Normal rate, regular rhythm and normal heart sounds.  Exam reveals no gallop and no friction rub.   No murmur heard. Pulmonary/Chest: Effort normal and breath sounds normal. She has no wheezes. She has no rales. Right breast exhibits no inverted nipple, no mass, no nipple discharge, no skin change and no tenderness. Left breast exhibits no inverted nipple, no mass, no nipple discharge, no skin change and no tenderness. Breasts are asymmetrical.  Well healed surgical scar.  Abdominal: Soft. Bowel sounds are normal. She exhibits no distension and no mass. There is no tenderness. There is no rebound and no guarding.  Musculoskeletal:       Right shoulder: Normal.       Left shoulder: Normal.       Cervical back: Normal.  Lymphadenopathy:    She has no cervical adenopathy.  Neurological: She is alert and oriented to person, place, and time. She has normal reflexes. No cranial nerve deficit. She exhibits normal muscle tone. Coordination normal.  Skin: Skin is warm and dry. No rash noted. She is not diaphoretic. No  erythema. No pallor.  Psychiatric: She has a normal mood and affect. Her behavior is normal. Judgment and thought content normal.  Nursing note and vitals reviewed.   No results found. Depression screen [facility] 2/9 03/02/2017 11/11/2016 08/25/2016 02/25/2016 09/10/2015  Decreased Interest 0 0 0 0 0  Down, Depressed, Hopeless 0 0 0 0 0  PHQ - 2 Score 0 0 0 0 0   Fall Risk  03/02/2017 11/11/2016 08/25/2016 02/25/2016 09/10/2015  Falls in the past year? No No No No No   Functional Status Survey: Is the patient deaf or have difficulty hearing?: No Does the patient have difficulty seeing, even when wearing glasses/contacts?: No Does  the patient have difficulty concentrating, remembering, or making decisions?: No Does the patient have difficulty walking or climbing stairs?: No Does the patient have difficulty dressing or bathing?: No Does the patient have difficulty doing errands alone such as visiting a doctor's office or shopping?: No      Assessment & Plan:   1. Medicare annual wellness visit, subsequent   2. Diabetes mellitus without complication ([place])   3. Osteopenia of multiple sites   4. Other pancytopenia ([place])   5. Anxiety and depression   6. Malignant neoplasm of upper-inner quadrant of right breast in female, estrogen receptor positive ([place])   7. Pure hypercholesterolemia   8. Psychophysiological insomnia   9. Need for Tdap vaccination   10. Estrogen deficiency    -anticipatory guidance provided --- exercise, weight loss, safe driving practices, aspirin 81mg  daily. -obtain age appropriate screening labs and labs for chronic disease management. -moderate fall risk; no evidence of depression; no evidence of hearing loss.  Discussed advanced directives and living will; also discussed end of life issues including code status.  -PATIENT WILLING TO PAY FOR TDAP at next visit; we are out today. -refills provided; no change in medications for chronic medical conditions.   -recommend weight loss,  exercise for 30-60 minutes five days per week; recommend 1200 kcal restriction per day with a minimum of 60 grams of protein per day. - I recommend weight loss, exercise, and low-carbohydrate low-sugar food choices. You should AVOID: regular sodas, sweetened tea, fruit juices.  You should LIMIT: breads, pastas, rice, potatoes, and desserts/sweets.  I would recommend limiting your total carbohydrate intake per meal to 45 grams; I would limit your total carbohydrate intake per snack to 30 grams.  I would also have a goal of 60 grams of protein intake per day; this would equal 10-15 grams of protein per meal and 5-10 grams of protein per snack.    Orders Placed This Encounter  Procedures  . DG Bone Density    Standing Status:   Future    Standing Expiration Date:   05/02/2018    Order Specific Question:   Reason for Exam (SYMPTOM  OR DIAGNOSIS REQUIRED)    Answer:   estrogen deficiency    Order Specific Question:   Preferred imaging location?    Answer:   [facility]  . Tdap vaccine greater than or equal to 7yo IM  . CBC with Differential/Platelet  . Comprehensive metabolic panel    Order Specific Question:   Has the patient fasted?    Answer:   Yes  . Lipid panel    Order Specific Question:   Has the patient fasted?    Answer:   Yes  . TSH  . Microalbumin, urine  . POCT glycosylated hemoglobin (Hb A1C)  . POCT urinalysis dipstick   Meds ordered this encounter  Medications  . Zoster Vac Recomb Adjuvanted [facility]) injection    Sig: Inject 0.5 mLs into the muscle once.    Dispense:  0.5 mL    Refill:  1  . PARoxetine (PAXIL) 20 MG tablet    Sig: Take 2 tablets (40 mg total) by mouth daily.    Dispense:  180 tablet    Refill:  3  . metFORMIN (GLUCOPHAGE-XR) 500 MG 24 hr tablet    Sig: Take 1 tablet (500 mg total) by mouth 2 (two) times daily.    Dispense:  180 tablet    Refill:  3  . frovatriptan (FROVA) 2.5 MG tablet  Sig: Take 1 tablet (2.5 mg total) by mouth as needed  for migraine (max 3 tabs in 24 hours). If recurs, may repeat after 2 hours. Max of 3 tabs in 24 hours.    Dispense:  10 tablet    Refill:  1  . esomeprazole (NEXIUM) 40 MG capsule    Sig: Take 1 capsule (40 mg total) by mouth daily.    Dispense:  90 capsule    Refill:  3  . atorvastatin (LIPITOR) 20 MG tablet    Sig: Take 1 tablet (20 mg total) by mouth daily.    Dispense:  90 tablet    Refill:  3  . ALPRAZolam (XANAX) 0.5 MG tablet    Sig: TAKE 1 TABLET BY MOUTH ONCE DAILY AT BEDTIME AS NEEDED FOR ANXIETY    Dispense:  30 tablet    Refill:  3  . diclofenac (VOLTAREN) 75 MG EC tablet    Sig: Take 1 tablet (75 mg total) by mouth 2 (two) times daily.    Dispense:  180 tablet    Refill:  3  . misoprostol (CYTOTEC) 200 MCG tablet    Sig: TAKE 1 TABLET BY MOUTH 4 TIMES DAILY.    Dispense:  120 tablet    Refill:  5    Return in about 6 months (around 08/30/2017) for recheck diabetes, high cholesterol.   Marveline Profeta Elayne Guerin, M.D. Primary Care at [facility] previously [facility] [address] [place], [place]  [address] [phone] phone [phone] fax

## 2017-03-03 LAB — COMPREHENSIVE METABOLIC PANEL
A/G RATIO: 2 (ref 1.2–2.2)
ALK PHOS: 57 IU/L (ref 39–117)
ALT: 28 IU/L (ref 0–32)
AST: 34 IU/L (ref 0–40)
Albumin: 4.6 g/dL (ref 3.6–4.8)
BUN/Creatinine Ratio: 11 — ABNORMAL LOW (ref 12–28)
BUN: 9 mg/dL (ref 8–27)
Bilirubin Total: 0.3 mg/dL (ref 0.0–1.2)
CALCIUM: 9.6 mg/dL (ref 8.7–10.3)
CO2: 24 mmol/L (ref 20–29)
Chloride: 97 mmol/L (ref 96–106)
Creatinine, Ser: 0.82 mg/dL (ref 0.57–1.00)
GFR calc Af Amer: 85 mL/min/{1.73_m2} (ref >59)
GFR, EST NON AFRICAN AMERICAN: 74 mL/min/{1.73_m2} (ref >59)
Globulin, Total: 2.3 g/dL (ref 1.5–4.5)
Glucose: 96 mg/dL (ref 65–99)
POTASSIUM: 4.8 mmol/L (ref 3.5–5.2)
Sodium: 136 mmol/L (ref 134–144)
Total Protein: 6.9 g/dL (ref 6.0–8.5)

## 2017-03-03 LAB — CBC WITH DIFFERENTIAL/PLATELET
Basophils Absolute: 0 10*3/uL (ref 0.0–0.2)
Basos: 1 %
EOS (ABSOLUTE): 0.1 10*3/uL (ref 0.0–0.4)
Eos: 4 %
Hematocrit: 36.1 % (ref 34.0–46.6)
Hemoglobin: 11.9 g/dL (ref 11.1–15.9)
IMMATURE GRANULOCYTES: 0 %
Immature Grans (Abs): 0 10*3/uL (ref 0.0–0.1)
Lymphocytes Absolute: 1.4 10*3/uL (ref 0.7–3.1)
Lymphs: 44 %
MCH: 28.4 pg (ref 26.6–33.0)
MCHC: 33 g/dL (ref 31.5–35.7)
MCV: 86 fL (ref 79–97)
MONOS ABS: 0.2 10*3/uL (ref 0.1–0.9)
Monocytes: 7 %
NEUTROS PCT: 44 %
Neutrophils Absolute: 1.3 10*3/uL — ABNORMAL LOW (ref 1.4–7.0)
PLATELETS: 206 10*3/uL (ref 150–379)
RBC: 4.19 x10E6/uL (ref 3.77–5.28)
RDW: 14.4 % (ref 12.3–15.4)
WBC: 3.1 10*3/uL — AB (ref 3.4–10.8)

## 2017-03-03 LAB — TSH: TSH: 2.47 u[IU]/mL (ref 0.450–4.500)

## 2017-03-03 LAB — LIPID PANEL
CHOL/HDL RATIO: 2.3 ratio (ref 0.0–4.4)
CHOLESTEROL TOTAL: 208 mg/dL — AB (ref 100–199)
HDL: 89 mg/dL (ref >39)
LDL Calculated: 102 mg/dL — ABNORMAL HIGH (ref 0–99)
TRIGLYCERIDES: 83 mg/dL (ref 0–149)
VLDL Cholesterol Cal: 17 mg/dL (ref 5–40)

## 2017-03-03 LAB — MICROALBUMIN, URINE: MICROALBUM., U, RANDOM: 3.1 ug/mL

## 2017-03-04 DIAGNOSIS — M19041 Primary osteoarthritis, right hand: Secondary | ICD-10-CM | POA: Diagnosis not present

## 2017-03-04 DIAGNOSIS — M25541 Pain in joints of right hand: Secondary | ICD-10-CM | POA: Diagnosis not present

## 2017-03-04 DIAGNOSIS — M19042 Primary osteoarthritis, left hand: Secondary | ICD-10-CM | POA: Diagnosis not present

## 2017-03-04 DIAGNOSIS — M25542 Pain in joints of left hand: Secondary | ICD-10-CM | POA: Diagnosis not present

## 2017-03-06 ENCOUNTER — Encounter: Payer: Self-pay | Admitting: Family Medicine

## 2017-03-08 ENCOUNTER — Telehealth: Payer: Self-pay | Admitting: *Deleted

## 2017-03-08 NOTE — Telephone Encounter (Signed)
Letter sent.

## 2017-03-09 MED FILL — METFORMIN HCL ER 500 MG TAB: 500 | 90 days supply | Qty: 180 | Fill #0

## 2017-03-09 MED FILL — ALPRAZolam 0.5 MG TABS: 0.5 | 30 days supply | Qty: 30 | Fill #0

## 2017-03-09 MED FILL — miSOPROStol 200 MCG TABS: 200 | 30 days supply | Qty: 120 | Fill #0

## 2017-03-09 MED FILL — DICLOFENAC SOD 75 MG TAB EC: 75 | 90 days supply | Qty: 180 | Fill #0

## 2017-03-09 MED FILL — ATORVASTATIN 20 MG TABLET: 20 | 90 days supply | Qty: 90 | Fill #0

## 2017-03-09 NOTE — Telephone Encounter (Signed)
At the patient's request, I mailed a copy of her lab results.

## 2017-03-10 ENCOUNTER — Telehealth: Payer: Self-pay | Admitting: Family Medicine

## 2017-03-10 DIAGNOSIS — M25541 Pain in joints of right hand: Secondary | ICD-10-CM | POA: Diagnosis not present

## 2017-03-10 DIAGNOSIS — M19041 Primary osteoarthritis, right hand: Secondary | ICD-10-CM | POA: Diagnosis not present

## 2017-03-10 DIAGNOSIS — M25542 Pain in joints of left hand: Secondary | ICD-10-CM | POA: Diagnosis not present

## 2017-03-10 DIAGNOSIS — M19042 Primary osteoarthritis, left hand: Secondary | ICD-10-CM | POA: Diagnosis not present

## 2017-03-10 NOTE — Telephone Encounter (Signed)
Please call the pharmacy for clarification; does patient need a prior authorization for the Frova or does it just cost patient $400?  If needs prior-authorization, please complete.

## 2017-03-10 NOTE — Telephone Encounter (Signed)
PATIENT WOULD LIKE DR. Tamala Julian TO KNOW THAT WHEN SHE WENT TO THE PHARMACY TO GET THE FROVA 2.5 MG, IT WAS $400.00 FOR 8 PILLS. SHE SAID SHE HAS TRIED OTHER TRIPTAMS SUCH AS [place], [place] MAXALT. SHE HAS ALSO TRIED OVER-THE-COUNTER MEDICINE LIKE ALEVE AND IBUPROFEN. ONLY THE FROVA WORKS. [place] TO DO A PRE-AUTH. TO HER [employer] INSURANCE TO SEE IF IT WILL BE CHEAPER THAT WAY. SHE HAS HAD 2 MIGRAINES IN THE LAST 4-6 WEEKS. BEST PHONE [phone] (CELL-PLEASE LEAVE HER A MESSAGE) PHARMACY CHOICE IS [facility]. [place]

## 2017-03-10 NOTE — Telephone Encounter (Signed)
Please advise 

## 2017-03-11 NOTE — Telephone Encounter (Signed)
Call to [facility] to get information on patient's request.  Pharmacy tech told me they ran a test run on Frova for the patient yesterday and 10 pills were $460.   I placed a call to [facility] and they advised me that patient does not have pharmacy coverage through them.  They advised me to call patient and have her to call them to initiate the [place].  Then she could herself start the PA and they will fax [place] a request for provider to sign and fax back. I called patient and advised her of the call.  I gave her the number to reach [place] and my direct line to CB if she needed anything more from me.   She was very excited that she might soon be getting relief from her headaches.

## 2017-03-12 DIAGNOSIS — M19041 Primary osteoarthritis, right hand: Secondary | ICD-10-CM | POA: Diagnosis not present

## 2017-03-12 DIAGNOSIS — M25541 Pain in joints of right hand: Secondary | ICD-10-CM | POA: Diagnosis not present

## 2017-03-12 DIAGNOSIS — M25542 Pain in joints of left hand: Secondary | ICD-10-CM | POA: Diagnosis not present

## 2017-03-12 DIAGNOSIS — M19042 Primary osteoarthritis, left hand: Secondary | ICD-10-CM | POA: Diagnosis not present

## 2017-03-12 NOTE — Telephone Encounter (Signed)
Completed form for patient that came via fax.  Placed form in provider's box at 104 to be signed.  After signature, should be faxed to [phone].  Reference # is [id number].

## 2017-03-13 NOTE — Telephone Encounter (Signed)
Form faxed over today 03/13/2017. Will hold on to form to recheck the process.

## 2017-03-15 DIAGNOSIS — M25541 Pain in joints of right hand: Secondary | ICD-10-CM | POA: Diagnosis not present

## 2017-03-15 DIAGNOSIS — M19041 Primary osteoarthritis, right hand: Secondary | ICD-10-CM | POA: Diagnosis not present

## 2017-03-15 DIAGNOSIS — M19042 Primary osteoarthritis, left hand: Secondary | ICD-10-CM | POA: Diagnosis not present

## 2017-03-15 DIAGNOSIS — M25542 Pain in joints of left hand: Secondary | ICD-10-CM | POA: Diagnosis not present

## 2017-03-17 DIAGNOSIS — M19042 Primary osteoarthritis, left hand: Secondary | ICD-10-CM | POA: Diagnosis not present

## 2017-03-17 DIAGNOSIS — M25542 Pain in joints of left hand: Secondary | ICD-10-CM | POA: Diagnosis not present

## 2017-03-17 DIAGNOSIS — M19041 Primary osteoarthritis, right hand: Secondary | ICD-10-CM | POA: Diagnosis not present

## 2017-03-17 DIAGNOSIS — M25541 Pain in joints of right hand: Secondary | ICD-10-CM | POA: Diagnosis not present

## 2017-03-18 ENCOUNTER — Telehealth: Payer: Self-pay | Admitting: Family Medicine

## 2017-03-18 NOTE — Telephone Encounter (Signed)
Pt called stating that [place] denied her claim for Flova.  She states that Tamala Julian has to call the grievance number (listed below) to get it covered.  They are currently wanting her to try axert, relpax, amerge, maxalt, zomid, and caforgot.  Pt states she has tried them in the past and they do not work like the [employer] does.  Pt states that she has to call within 10 days of the denial or they will have to start over again.  The start date was 9/20.  Please advise  [phone]

## 2017-03-19 DIAGNOSIS — M19041 Primary osteoarthritis, right hand: Secondary | ICD-10-CM | POA: Diagnosis not present

## 2017-03-19 DIAGNOSIS — M25542 Pain in joints of left hand: Secondary | ICD-10-CM | POA: Diagnosis not present

## 2017-03-19 DIAGNOSIS — M25541 Pain in joints of right hand: Secondary | ICD-10-CM | POA: Diagnosis not present

## 2017-03-19 DIAGNOSIS — M19042 Primary osteoarthritis, left hand: Secondary | ICD-10-CM | POA: Diagnosis not present

## 2017-03-19 NOTE — Telephone Encounter (Signed)
Grievance was started today for Frova.  Insurance company requests all recent OV's pertaining to Migraine Headaches be faxed to them at [phone] attn: Grievance Dept.  Case Id# [id number].  After notes are faxed should get result in 72 hours.

## 2017-03-19 NOTE — Telephone Encounter (Signed)
Dr Tamala Julian, please advise. Patient needs provider appeal for medication./ S.[name] Row,CMA

## 2017-03-19 NOTE — Telephone Encounter (Signed)
Renee, please start grievance process; please call listed number.

## 2017-03-22 DIAGNOSIS — M25541 Pain in joints of right hand: Secondary | ICD-10-CM | POA: Diagnosis not present

## 2017-03-22 DIAGNOSIS — M19041 Primary osteoarthritis, right hand: Secondary | ICD-10-CM | POA: Diagnosis not present

## 2017-03-22 DIAGNOSIS — M25542 Pain in joints of left hand: Secondary | ICD-10-CM | POA: Diagnosis not present

## 2017-03-22 DIAGNOSIS — M19042 Primary osteoarthritis, left hand: Secondary | ICD-10-CM | POA: Diagnosis not present

## 2017-03-22 NOTE — Telephone Encounter (Signed)
Received a fax stating that Grievance was denied. I called to [facility] and requested to challenge the ruling of the grievance. They did approve this action. I informed the pharmacist that the patient has taken rizatriptan (Maxalt) previously and that this information was included on the original request.  He apologized for all the inconvenienced and approved the Frova for the patient from today until 06/21/2018.  PA# [id number]. Patient notified of approval.

## 2017-03-23 MED FILL — FROVATRIPTAN SUCC 2.5 MG TA: 2.5 | 2 days supply | Qty: 4 | Fill #0

## 2017-03-24 DIAGNOSIS — M25542 Pain in joints of left hand: Secondary | ICD-10-CM | POA: Diagnosis not present

## 2017-03-24 DIAGNOSIS — M25541 Pain in joints of right hand: Secondary | ICD-10-CM | POA: Diagnosis not present

## 2017-03-24 DIAGNOSIS — M19042 Primary osteoarthritis, left hand: Secondary | ICD-10-CM | POA: Diagnosis not present

## 2017-03-24 DIAGNOSIS — M19041 Primary osteoarthritis, right hand: Secondary | ICD-10-CM | POA: Diagnosis not present

## 2017-03-26 DIAGNOSIS — M19042 Primary osteoarthritis, left hand: Secondary | ICD-10-CM | POA: Diagnosis not present

## 2017-03-26 DIAGNOSIS — M25541 Pain in joints of right hand: Secondary | ICD-10-CM | POA: Diagnosis not present

## 2017-03-26 DIAGNOSIS — M25542 Pain in joints of left hand: Secondary | ICD-10-CM | POA: Diagnosis not present

## 2017-03-26 DIAGNOSIS — M19041 Primary osteoarthritis, right hand: Secondary | ICD-10-CM | POA: Diagnosis not present

## 2017-03-29 DIAGNOSIS — M19042 Primary osteoarthritis, left hand: Secondary | ICD-10-CM | POA: Diagnosis not present

## 2017-03-29 DIAGNOSIS — M25542 Pain in joints of left hand: Secondary | ICD-10-CM | POA: Diagnosis not present

## 2017-03-29 DIAGNOSIS — M19041 Primary osteoarthritis, right hand: Secondary | ICD-10-CM | POA: Diagnosis not present

## 2017-03-29 DIAGNOSIS — M25541 Pain in joints of right hand: Secondary | ICD-10-CM | POA: Diagnosis not present

## 2017-03-31 DIAGNOSIS — M19041 Primary osteoarthritis, right hand: Secondary | ICD-10-CM | POA: Diagnosis not present

## 2017-03-31 DIAGNOSIS — M25541 Pain in joints of right hand: Secondary | ICD-10-CM | POA: Diagnosis not present

## 2017-03-31 DIAGNOSIS — M25542 Pain in joints of left hand: Secondary | ICD-10-CM | POA: Diagnosis not present

## 2017-03-31 DIAGNOSIS — M19042 Primary osteoarthritis, left hand: Secondary | ICD-10-CM | POA: Diagnosis not present

## 2017-04-04 DIAGNOSIS — Z23 Encounter for immunization: Secondary | ICD-10-CM | POA: Diagnosis not present

## 2017-04-05 DIAGNOSIS — M19042 Primary osteoarthritis, left hand: Secondary | ICD-10-CM | POA: Diagnosis not present

## 2017-04-05 DIAGNOSIS — M19041 Primary osteoarthritis, right hand: Secondary | ICD-10-CM | POA: Diagnosis not present

## 2017-04-05 DIAGNOSIS — M180 Bilateral primary osteoarthritis of first carpometacarpal joints: Secondary | ICD-10-CM | POA: Diagnosis not present

## 2017-04-06 DIAGNOSIS — M25542 Pain in joints of left hand: Secondary | ICD-10-CM | POA: Diagnosis not present

## 2017-04-06 DIAGNOSIS — M25541 Pain in joints of right hand: Secondary | ICD-10-CM | POA: Diagnosis not present

## 2017-04-06 DIAGNOSIS — M19042 Primary osteoarthritis, left hand: Secondary | ICD-10-CM | POA: Diagnosis not present

## 2017-04-06 DIAGNOSIS — M19041 Primary osteoarthritis, right hand: Secondary | ICD-10-CM | POA: Diagnosis not present

## 2017-04-08 DIAGNOSIS — M19042 Primary osteoarthritis, left hand: Secondary | ICD-10-CM | POA: Diagnosis not present

## 2017-04-08 DIAGNOSIS — M25542 Pain in joints of left hand: Secondary | ICD-10-CM | POA: Diagnosis not present

## 2017-04-08 DIAGNOSIS — M19041 Primary osteoarthritis, right hand: Secondary | ICD-10-CM | POA: Diagnosis not present

## 2017-04-08 DIAGNOSIS — M25541 Pain in joints of right hand: Secondary | ICD-10-CM | POA: Diagnosis not present

## 2017-04-13 DIAGNOSIS — M25541 Pain in joints of right hand: Secondary | ICD-10-CM | POA: Diagnosis not present

## 2017-04-13 DIAGNOSIS — M25542 Pain in joints of left hand: Secondary | ICD-10-CM | POA: Diagnosis not present

## 2017-04-13 DIAGNOSIS — M19042 Primary osteoarthritis, left hand: Secondary | ICD-10-CM | POA: Diagnosis not present

## 2017-04-13 DIAGNOSIS — M19041 Primary osteoarthritis, right hand: Secondary | ICD-10-CM | POA: Diagnosis not present

## 2017-04-15 ENCOUNTER — Other Ambulatory Visit: Payer: Self-pay | Admitting: Family Medicine

## 2017-04-15 ENCOUNTER — Telehealth: Payer: Self-pay | Admitting: Family Medicine

## 2017-04-15 DIAGNOSIS — M25542 Pain in joints of left hand: Secondary | ICD-10-CM | POA: Diagnosis not present

## 2017-04-15 DIAGNOSIS — M19041 Primary osteoarthritis, right hand: Secondary | ICD-10-CM | POA: Diagnosis not present

## 2017-04-15 DIAGNOSIS — M25541 Pain in joints of right hand: Secondary | ICD-10-CM | POA: Diagnosis not present

## 2017-04-15 DIAGNOSIS — M19042 Primary osteoarthritis, left hand: Secondary | ICD-10-CM | POA: Diagnosis not present

## 2017-04-15 MED FILL — ALPRAZolam 0.5 MG TABS: 0.5 | 30 days supply | Qty: 30 | Fill #1

## 2017-04-15 NOTE — Telephone Encounter (Signed)
Pt. Would like for me to write down exactly what she says:  "I called today and tried to fill a RX at [facility] for a RX S-omeprazole and they said that they could not fill it because it needed prior authorization. I have barrotts esophagus and I don't understand why they won't fill it as they have filled it all this year. [place] is faxing over a RX for a refill. "

## 2017-04-15 NOTE — Telephone Encounter (Signed)
Pt called back and would like for me to write everything she says down again...   "I have initiated the pre-auth process with [facility] today. So you should be getting a fax for the RX at [address]. The agent said that there was a 14 day turnout or they will just drop it and start all over again."

## 2017-04-16 ENCOUNTER — Telehealth: Payer: Self-pay

## 2017-04-16 MED FILL — FROVATRIPTAN SUCC 2.5 MG TA: 2.5 | 1 days supply | Qty: 2 | Fill #1

## 2017-04-16 MED FILL — ESOMEPRAZOLE MAG DR 40 MG C: 40 | 90 days supply | Qty: 90 | Fill #0 | Status: TO

## 2017-04-16 NOTE — Telephone Encounter (Signed)
Received an PA request for generic nexium.  Submitted request over the phone and should get a reply via fax in 72 hrs.

## 2017-04-19 NOTE — Telephone Encounter (Signed)
Dr. Tamala Julian, Received a denial from patient's insurance re: esomeprazole.  It is non-formulary medication and was denied.  Please prescribe a formulary medication for patient.  Either Dexilant or Pantoprazole. Thank you, Gillian Meeuwsen

## 2017-04-19 NOTE — Telephone Encounter (Signed)
Please call patient --- has she ever taken Protonix or Dexilant? If yes, did she have side effects and was medication effective ineffective. Please advise pt that PA for nexium denied.

## 2017-04-20 NOTE — Telephone Encounter (Signed)
Alisha Matthews returned my call.  She has tried pantoprazole, and she states that she has a DX of Barrett's Esophagus.  I called insurance company and started an appeal.  The new Ref # is [id number] (and it should be placed on the cover sheet when faxing back) To contact appeals dept via phone call [phone], and their fax # is [phone]. This MUST be completed in 72 hours or the whole process will have to be started over.

## 2017-04-20 NOTE — Telephone Encounter (Signed)
This is the patient I have been trying to talk with Jenny Reichmann about all day.  I will not be in the office tomorrow, and have done everything I can possibly do today.  I need John to follow up on the patient's status tomorrow.  He said he will get with me before he leaves today to discuss this case.

## 2017-04-20 NOTE — Telephone Encounter (Signed)
LMVM for patient to CB regarding her nexium PA.  Left my direct line for her to reach me.

## 2017-04-20 NOTE — Telephone Encounter (Signed)
Can you look into this please

## 2017-04-22 DIAGNOSIS — M25542 Pain in joints of left hand: Secondary | ICD-10-CM | POA: Diagnosis not present

## 2017-04-22 DIAGNOSIS — M19042 Primary osteoarthritis, left hand: Secondary | ICD-10-CM | POA: Diagnosis not present

## 2017-04-22 DIAGNOSIS — M19041 Primary osteoarthritis, right hand: Secondary | ICD-10-CM | POA: Diagnosis not present

## 2017-04-22 DIAGNOSIS — M25541 Pain in joints of right hand: Secondary | ICD-10-CM | POA: Diagnosis not present

## 2017-04-23 ENCOUNTER — Telehealth: Payer: Self-pay | Admitting: Family Medicine

## 2017-04-23 NOTE — Telephone Encounter (Signed)
Pt calling wanting a RX for Omeprazole 40 mg please sent to the [facility]

## 2017-04-26 MED FILL — FROVATRIPTAN SUCC 2.5 MG TA: 2.5 | 3 days supply | Qty: 6 | Fill #2 | Status: TO

## 2017-04-26 NOTE — Telephone Encounter (Signed)
Pt is calling to check on the status of her omeprazole prescription.  She states that since [facility] Ros[facility]ti has denied coverage, she is willing to pay out of pocket for the prescription.  Pt is requesting it be sent to the [facility] outpatient pharmacy.  Please advise, pt is almost out of prescription.  [phone]

## 2017-04-26 NOTE — Telephone Encounter (Signed)
Gurpreet called and stated that she will take omeprazole and that [facility] has it OTC.  She will just get it there.

## 2017-04-26 NOTE — Telephone Encounter (Signed)
Pt called back stating to disregard last message. Thank you!

## 2017-04-28 DIAGNOSIS — M25541 Pain in joints of right hand: Secondary | ICD-10-CM | POA: Diagnosis not present

## 2017-04-28 DIAGNOSIS — M19041 Primary osteoarthritis, right hand: Secondary | ICD-10-CM | POA: Diagnosis not present

## 2017-04-28 DIAGNOSIS — M19042 Primary osteoarthritis, left hand: Secondary | ICD-10-CM | POA: Diagnosis not present

## 2017-04-28 DIAGNOSIS — M25542 Pain in joints of left hand: Secondary | ICD-10-CM | POA: Diagnosis not present

## 2017-04-30 DIAGNOSIS — M19042 Primary osteoarthritis, left hand: Secondary | ICD-10-CM | POA: Diagnosis not present

## 2017-04-30 DIAGNOSIS — M25542 Pain in joints of left hand: Secondary | ICD-10-CM | POA: Diagnosis not present

## 2017-04-30 DIAGNOSIS — M19041 Primary osteoarthritis, right hand: Secondary | ICD-10-CM | POA: Diagnosis not present

## 2017-04-30 DIAGNOSIS — M25541 Pain in joints of right hand: Secondary | ICD-10-CM | POA: Diagnosis not present

## 2017-05-06 DIAGNOSIS — M19041 Primary osteoarthritis, right hand: Secondary | ICD-10-CM | POA: Diagnosis not present

## 2017-05-06 DIAGNOSIS — M25541 Pain in joints of right hand: Secondary | ICD-10-CM | POA: Diagnosis not present

## 2017-05-06 DIAGNOSIS — M19042 Primary osteoarthritis, left hand: Secondary | ICD-10-CM | POA: Diagnosis not present

## 2017-05-06 DIAGNOSIS — M25542 Pain in joints of left hand: Secondary | ICD-10-CM | POA: Diagnosis not present

## 2017-06-07 ENCOUNTER — Telehealth: Payer: Self-pay | Admitting: Family Medicine

## 2017-06-07 MED FILL — DICLOFENAC SODIUM 75 MG TAB: 75 | 90 days supply | Qty: 180 | Fill #1 | Status: TO

## 2017-06-07 MED FILL — ALPRAZolam 0.5 MG TABS: 0.5 | 30 days supply | Qty: 30 | Fill #2 | Status: TO

## 2017-06-07 MED FILL — METFORMIN HCL ER 500 MG TAB: 500 | 90 days supply | Qty: 180 | Fill #1 | Status: TO

## 2017-06-07 MED FILL — miSOPROStol 200 MCG TABS: 200 | 30 days supply | Qty: 120 | Fill #1 | Status: TO

## 2017-06-07 MED FILL — ATORVASTATIN 20 MG TABLET: 20 | 90 days supply | Qty: 90 | Fill #1 | Status: TO

## 2017-06-07 NOTE — Telephone Encounter (Signed)
Copied from [place] [phone]. Topic: Quick Communication - Rx Refill/Question >> Jun 07, 2017 12:44 PM [place], Laban Emperor wrote: Has the patient contacted their pharmacy? Yes.     (Agent: If no, request that the patient contact the pharmacy for the refill.)   Preferred Pharmacy (with phone number or street name): [place], [place] - [address]. [phone] (Phone) [phone] (Fax)  Richardson Landry with [facility] outpatient pharmacy states pt insurance will not cover the 20mg  paroxetine. And would like to know if dr. Tamala Julian will call in a order for 40mg , 3 month supply?   Agent: Please be advised that RX refills may take up to 3 business days. We ask that you follow-up with your pharmacy.

## 2017-06-08 DIAGNOSIS — Z853 Personal history of malignant neoplasm of breast: Secondary | ICD-10-CM | POA: Diagnosis not present

## 2017-06-08 DIAGNOSIS — N6311 Unspecified lump in the right breast, upper outer quadrant: Secondary | ICD-10-CM | POA: Diagnosis not present

## 2017-06-08 DIAGNOSIS — N6313 Unspecified lump in the right breast, lower outer quadrant: Secondary | ICD-10-CM | POA: Diagnosis not present

## 2017-06-09 DIAGNOSIS — J0100 Acute maxillary sinusitis, unspecified: Secondary | ICD-10-CM | POA: Diagnosis not present

## 2017-06-09 DIAGNOSIS — R1110 Vomiting, unspecified: Secondary | ICD-10-CM | POA: Diagnosis not present

## 2017-06-09 DIAGNOSIS — E119 Type 2 diabetes mellitus without complications: Secondary | ICD-10-CM | POA: Diagnosis not present

## 2017-06-09 NOTE — Telephone Encounter (Signed)
Please advise 

## 2017-06-16 ENCOUNTER — Encounter: Payer: Self-pay | Admitting: Oncology

## 2017-06-16 ENCOUNTER — Other Ambulatory Visit: Payer: Self-pay | Admitting: Radiology

## 2017-06-16 DIAGNOSIS — N641 Fat necrosis of breast: Secondary | ICD-10-CM | POA: Diagnosis not present

## 2017-06-17 ENCOUNTER — Telehealth: Payer: Self-pay | Admitting: Family Medicine

## 2017-06-17 NOTE — Telephone Encounter (Signed)
Copied from [place] [phone]. Topic: Quick Communication - See Telephone Encounter >> Jun 17, 2017 10:53 AM Percell Belt A wrote: CRM for notification. See Telephone encounter for: pt called in and said that she was given a written script for  PARoxetine (PAXIL) 20 MG tablet [[id number]] it was written for 2 time daily but should have been written for 1 time daily.  She is wanting this filled this week and said that the pharmacy has been faxing over the corrected script.  She said that she has been on paroxetine 1 time daily for years.  She would like new script faxed over to pharmacy.    Pt is aware Dr Tamala Julian will not be back in office til Monday the 31st   06/17/17.

## 2017-06-18 MED ORDER — PAROXETINE HCL 40 MG PO TABS: 40.0000 mg | ORAL_TABLET | Freq: Every day | ORAL | 1 refills | Status: DC

## 2017-06-19 NOTE — Telephone Encounter (Signed)
Done on 12/28

## 2017-06-21 MED FILL — PARoxetine HCL 20 MG TABS: 20 | 30 days supply | Qty: 60 | Fill #0 | Status: TO

## 2017-06-25 ENCOUNTER — Telehealth: Payer: Self-pay

## 2017-06-25 NOTE — Telephone Encounter (Signed)
Received a PA request for patient's generic Paxil due to fact they will not cover 2 tabs and request 1 40 mg tab.  Reviewed chart and saw where Dr. Tamala Julian had written this DY on 06/18/2017.  Called to pharmacy and gave them the updated RX.

## 2017-07-05 ENCOUNTER — Other Ambulatory Visit: Payer: Self-pay | Admitting: Family Medicine

## 2017-07-05 MED ORDER — PAROXETINE HCL 40 MG PO TABS: 40.0000 mg | ORAL_TABLET | Freq: Every day | ORAL | 1 refills | Status: DC

## 2017-08-30 ENCOUNTER — Ambulatory Visit (INDEPENDENT_AMBULATORY_CARE_PROVIDER_SITE_OTHER): Payer: Medicare Other | Admitting: Family Medicine

## 2017-08-30 ENCOUNTER — Encounter: Payer: Self-pay | Admitting: Family Medicine

## 2017-08-30 VITALS — BP 132/82 | HR 87 | Temp 98.7°F | Resp 18 | Ht 62.21 in | Wt 155.0 lb

## 2017-08-30 DIAGNOSIS — E7800 Pure hypercholesterolemia, unspecified: Secondary | ICD-10-CM

## 2017-08-30 DIAGNOSIS — F5104 Psychophysiologic insomnia: Secondary | ICD-10-CM

## 2017-08-30 DIAGNOSIS — F32A Depression, unspecified: Secondary | ICD-10-CM

## 2017-08-30 DIAGNOSIS — F329 Major depressive disorder, single episode, unspecified: Secondary | ICD-10-CM

## 2017-08-30 DIAGNOSIS — F419 Anxiety disorder, unspecified: Secondary | ICD-10-CM

## 2017-08-30 DIAGNOSIS — G43709 Chronic migraine without aura, not intractable, without status migrainosus: Secondary | ICD-10-CM | POA: Diagnosis not present

## 2017-08-30 DIAGNOSIS — C50211 Malignant neoplasm of upper-inner quadrant of right female breast: Secondary | ICD-10-CM | POA: Diagnosis not present

## 2017-08-30 DIAGNOSIS — E119 Type 2 diabetes mellitus without complications: Secondary | ICD-10-CM | POA: Diagnosis not present

## 2017-08-30 DIAGNOSIS — Z170 Estrogen receptor positive status [ER+]: Secondary | ICD-10-CM | POA: Diagnosis not present

## 2017-08-30 DIAGNOSIS — Z23 Encounter for immunization: Secondary | ICD-10-CM

## 2017-08-30 LAB — COMPREHENSIVE METABOLIC PANEL
ALT: 27 IU/L (ref 0–32)
AST: 28 IU/L (ref 0–40)
Albumin/Globulin Ratio: 2 (ref 1.2–2.2)
Albumin: 4.2 g/dL (ref 3.6–4.8)
Alkaline Phosphatase: 52 IU/L (ref 39–117)
BUN/Creatinine Ratio: 8 — ABNORMAL LOW (ref 12–28)
BUN: 6 mg/dL — ABNORMAL LOW (ref 8–27)
Bilirubin Total: 0.2 mg/dL (ref 0.0–1.2)
CALCIUM: 9.2 mg/dL (ref 8.7–10.3)
CO2: 24 mmol/L (ref 20–29)
CREATININE: 0.77 mg/dL (ref 0.57–1.00)
Chloride: 100 mmol/L (ref 96–106)
GFR calc Af Amer: 92 mL/min/{1.73_m2} (ref >59)
GFR, EST NON AFRICAN AMERICAN: 80 mL/min/{1.73_m2} (ref >59)
GLOBULIN, TOTAL: 2.1 g/dL (ref 1.5–4.5)
Glucose: 100 mg/dL — ABNORMAL HIGH (ref 65–99)
Potassium: 4.6 mmol/L (ref 3.5–5.2)
SODIUM: 139 mmol/L (ref 134–144)
TOTAL PROTEIN: 6.3 g/dL (ref 6.0–8.5)

## 2017-08-30 LAB — CBC WITH DIFFERENTIAL/PLATELET
Basophils Absolute: 0 10*3/uL (ref 0.0–0.2)
Basos: 1 %
EOS (ABSOLUTE): 0.2 10*3/uL (ref 0.0–0.4)
EOS: 5 %
HEMATOCRIT: 33.6 % — AB (ref 34.0–46.6)
Hemoglobin: 10.6 g/dL — ABNORMAL LOW (ref 11.1–15.9)
IMMATURE GRANS (ABS): 0 10*3/uL (ref 0.0–0.1)
IMMATURE GRANULOCYTES: 0 %
LYMPHS: 42 %
Lymphocytes Absolute: 1.5 10*3/uL (ref 0.7–3.1)
MCH: 27.2 pg (ref 26.6–33.0)
MCHC: 31.5 g/dL (ref 31.5–35.7)
MCV: 86 fL (ref 79–97)
MONOCYTES: 6 %
MONOS ABS: 0.2 10*3/uL (ref 0.1–0.9)
NEUTROS PCT: 46 %
Neutrophils Absolute: 1.7 10*3/uL (ref 1.4–7.0)
Platelets: 193 10*3/uL (ref 150–379)
RBC: 3.89 x10E6/uL (ref 3.77–5.28)
RDW: 13.9 % (ref 12.3–15.4)
WBC: 3.6 10*3/uL (ref 3.4–10.8)

## 2017-08-30 LAB — LIPID PANEL
CHOL/HDL RATIO: 2.1 ratio (ref 0.0–4.4)
Cholesterol, Total: 189 mg/dL (ref 100–199)
HDL: 88 mg/dL (ref >39)
LDL CALC: 87 mg/dL (ref 0–99)
TRIGLYCERIDES: 68 mg/dL (ref 0–149)
VLDL Cholesterol Cal: 14 mg/dL (ref 5–40)

## 2017-08-30 LAB — POCT GLYCOSYLATED HEMOGLOBIN (HGB A1C): Hemoglobin A1C: 5.9

## 2017-08-30 LAB — GLUCOSE, POCT (MANUAL RESULT ENTRY): POC Glucose: 101 mg/dl — AB (ref 70–99)

## 2017-08-30 MED ORDER — MISOPROSTOL 200 MCG PO TABS
ORAL_TABLET | ORAL | 5 refills | Status: DC
Start: 2017-08-30 — End: 2018-03-09

## 2017-08-30 MED ORDER — ESOMEPRAZOLE MAGNESIUM 40 MG PO CPDR: 40.0000 mg | DELAYED_RELEASE_CAPSULE | Freq: Every day | ORAL | 3 refills | Status: DC

## 2017-08-30 MED ORDER — PAROXETINE HCL 40 MG PO TABS: 40.0000 mg | ORAL_TABLET | Freq: Every day | ORAL | 1 refills | Status: DC

## 2017-08-30 MED ORDER — DICLOFENAC SODIUM 75 MG PO TBEC: 75.0000 mg | DELAYED_RELEASE_TABLET | Freq: Two times a day (BID) | ORAL | 3 refills | Status: DC

## 2017-08-30 MED ORDER — METFORMIN HCL ER 500 MG PO TB24: 500.0000 mg | ORAL_TABLET | Freq: Two times a day (BID) | ORAL | 3 refills | Status: DC

## 2017-08-30 MED ORDER — PAROXETINE HCL 20 MG PO TABS: 20.0000 mg | ORAL_TABLET | Freq: Every day | ORAL | 3 refills | Status: DC

## 2017-08-30 MED ORDER — ALPRAZOLAM 0.5 MG PO TABS: ORAL_TABLET | ORAL | 3 refills | Status: DC

## 2017-08-30 MED ORDER — ATORVASTATIN CALCIUM 20 MG PO TABS: 20.0000 mg | ORAL_TABLET | Freq: Every day | ORAL | 3 refills | Status: DC

## 2017-08-30 NOTE — Progress Notes (Signed)
Subjective:    Patient ID: Alisha Matthews, female    DOB: 1950-08-13, 67 y.o.   MRN: [id number]  08/30/2017  Chronic Conditions (6 month follow-up )    HPI This 68 y.o. female presents for six month follow-up of DMII, hypercholesterolemia, anxiety/depression, GERD, osteoarthritis.  No changes to management made at last visit.   -WBC count remains low but stable at 3.1.  -Cholesterol is under good control.  -Thyroid function is normal.  -Hemoglobin A1c is under good control at 6.0 -Urine is normal.  Breast lesion: s/p biopsy; breast necrosis.  Has large cat that jumps on breasts. No MVAs.   Needs TDAP. Unable to complete cologuard.   Sugars are normal at home.   BP Readings from Last 3 Encounters:  08/30/17 132/82  11/11/16 138/86  08/25/16 125/75   Wt Readings from Last 3 Encounters:  08/30/17 155 lb (70.3 kg)  11/11/16 155 lb 3.2 oz (70.4 kg)  08/25/16 155 lb (70.3 kg)   Immunization History  Administered Date(s) Administered  . Influenza Split 03/22/2012, 04/08/2015  . Influenza,inj,Quad PF,6+ Mos 05/01/2013, 03/19/2014  . Influenza-Unspecified 04/20/2016  . Pneumococcal Conjugate-13 07/09/2014  . Pneumococcal Polysaccharide-23 06/22/2005, 09/10/2015  . Tdap 08/30/2017  . Zoster 08/01/2013    Review of Systems  Constitutional: Negative for chills, diaphoresis, fatigue and fever.  Eyes: Negative for visual disturbance.  Respiratory: Negative for cough and shortness of breath.   Cardiovascular: Negative for chest pain, palpitations and leg swelling.  Gastrointestinal: Negative for abdominal pain, constipation, diarrhea, nausea and vomiting.  Endocrine: Negative for cold intolerance, heat intolerance, polydipsia, polyphagia and polyuria.  Neurological: Negative for dizziness, tremors, seizures, syncope, facial asymmetry, speech difficulty, weakness, light-headedness, numbness and headaches.    Past Medical History:  Diagnosis Date  . Anemia, unspecified   .  Anxiety   . Arthritis   . Asthma   . Barrett's esophagus   . Breast cancer ([place])    right breast invasive ductal carcinoma   . Breast fibrocystic disorder   . Bronchitis   . Chicken pox   . Constipation, chronic   . Depressive disorder, not elsewhere classified   . Diabetes mellitus    borderline  . Essential hypertension, benign   . Exercise induced bronchospasm   . Genital warts   . GERD (gastroesophageal reflux disease)   . Hyperlipidemia   . Infected postoperative breast seroma   . Insomnia, unspecified   . Leukocytopenia, unspecified   . Measles   . Migraine, unspecified, without mention of intractable migraine without mention of status migrainosus   . Mumps   . Obesity, unspecified   . Osteoarthrosis, unspecified whether generalized or localized, ankle and foot    ankle/foot  . Personal history of colonic polyps   . Symptomatic menopausal or female climacteric states    Past Surgical History:  Procedure Laterality Date  . BREAST LUMPECTOMY WITH NEEDLE LOCALIZATION AND AXILLARY SENTINEL LYMPH NODE BX  06/08/2012   Procedure: BREAST LUMPECTOMY WITH NEEDLE LOCALIZATION AND AXILLARY SENTINEL LYMPH NODE BX;  Surgeon: Rolm Bookbinder, MD;  Location: [facility];  Service: General;  Laterality: Right;  . BREAST SURGERY    . BUNIONECTOMY     3 surgeries on both feet  . CHOLECYSTECTOMY    . COLONOSCOPY    . DILATION AND CURETTAGE OF UTERUS  1978  . EYE SURGERY     Orbital right eye surgery  . FOOT SURGERY     Left-revision  . JOINT REPLACEMENT    .  NASAL SEPTUM SURGERY    . RE-EXCISION OF BREAST CANCER,SUPERIOR MARGINS  06/27/2012   Procedure: RE-EXCISION OF BREAST CANCER,SUPERIOR MARGINS;  Surgeon: Rolm Bookbinder, MD;  Location: [facility];  Service: General;  Laterality: Right;  . TOTAL HIP ARTHROPLASTY     left      Perthes disease  . TOTAL KNEE ARTHROPLASTY Left 02/20/2014   Procedure: TOTAL KNEE ARTHROPLASTY;  Surgeon: Hessie Dibble, MD;  Location: [facility];  Service: Orthopedics;  Laterality: Left;   Allergies  Allergen Reactions  . Penicillins Rash   Current Outpatient Medications on File Prior to Visit  Medication Sig Dispense Refill  . albuterol (PROVENTIL HFA;VENTOLIN HFA) 108 (90 Base) MCG/ACT inhaler Inhale 2 puffs into the lungs every 6 (six) hours as needed. Asthmatic attack 18 g 3  . aspirin EC 325 MG EC tablet Take 1 tablet (325 mg total) by mouth 2 (two) times daily after a meal. (Patient taking differently: Take 81 mg by mouth daily. ) 30 tablet 0  . Calcium-Magnesium-Vitamin D (CALCIUM 500 PO) Take 500 mg by mouth 3 (three) times daily.    . clindamycin (CLEOCIN) 150 MG capsule Take 600 mg by mouth as needed (dental work).     . Coenzyme Q10 (COQ10) 200 MG CAPS Take 200 mg by mouth daily.    Marland Kitchen Diindolylmethane POWD 1 Package by Does not apply route daily. Reported on 09/10/2015    . diphenhydrAMINE (BENADRYL) 25 MG tablet Take 25 mg by mouth at bedtime as needed for sleep. When not taking xanax.    . docusate sodium (COLACE) 100 MG capsule Take 100 mg by mouth 2 (two) times daily. Reported on 09/10/2015    . frovatriptan (FROVA) 2.5 MG tablet Take 1 tablet (2.5 mg total) by mouth as needed for migraine (max 3 tabs in 24 hours). If recurs, may repeat after 2 hours. Max of 3 tabs in 24 hours. 10 tablet 1  . LevOCARNitine (CARNITINE PO) Take 3,000 mg by mouth daily.     Marland Kitchen lisinopril (PRINIVIL,ZESTRIL) 5 MG tablet Take 1 tablet (5 mg total) by mouth daily. 90 tablet 3  . methocarbamol (ROBAXIN) 500 MG tablet Take 1 tablet (500 mg total) by mouth every 8 (eight) hours as needed for muscle spasms. (Patient taking differently: Take 750 mg by mouth every 8 (eight) hours as needed for muscle spasms. ) 50 tablet 0  . Multiple Vitamins-Minerals (ANTIOXIDANT PO) Take 1 capsule by mouth daily. Fruit    . OVER THE COUNTER MEDICATION Take 1 capsule by mouth daily. Procast package: Elite-100 omega 3 vitamin    . OVER THE  COUNTER MEDICATION Take 1 capsule by mouth daily. Procast: Memory,brain and liver vitamin    . OVER THE COUNTER MEDICATION Take 1 capsule by mouth daily. Circulation and vein support vitamin    . PRESCRIPTION MEDICATION Reported on 09/10/2015    . Probiotic Product (PROBIOTIC DAILY PO) Take 1 capsule by mouth daily.    Marland Kitchen selenium sulfide (SELSUN) 2.5 % shampoo Apply 1 application topically daily as needed for irritation.    . simethicone (MYLICON) 80 MG chewable tablet Chew 80 mg by mouth every 6 (six) hours as needed for flatulence.    . terbinafine (LAMISIL) 250 MG tablet Take 1 tablet (250 mg total) by mouth daily. 14 tablet 0   No current facility-administered medications on file prior to visit.    Social History   Socioeconomic History  . Marital status: Married    Spouse name:  Shanon Brow  . Number of children: 0  . Years of education: college  . Highest education level: Not on file  Occupational History  . Occupation: retired    Fish farm manager: [employer]    Comment: previous [place] at [employer]  . Financial resource strain: Not on file  . Food insecurity:    Worry: Not on file    Inability: Not on file  . Transportation needs:    Medical: Not on file    Non-medical: Not on file  Tobacco Use  . Smoking status: Never Smoker  . Smokeless tobacco: Never Used  Substance and Sexual Activity  . Alcohol use: No  . Drug use: No  . Sexual activity: Never    Partners: Male    Birth control/protection: Post-menopausal    Comment: post menopausal  Lifestyle  . Physical activity:    Days per week: Not on file    Minutes per session: Not on file  . Stress: Not on file  Relationships  . Social connections:    Talks on phone: Not on file    Gets together: Not on file    Attends religious service: Not on file    Active member of club or organization: Not on file    Attends meetings of clubs or organizations: Not on file    Relationship status: Not on file  . Intimate partner  violence:    Fear of current or ex partner: Not on file    Emotionally abused: Not on file    Physically abused: Not on file    Forced sexual activity: Not on file  Other Topics Concern  . Not on file  Social History Narrative   Marital status:  Married x 19 years happily, second marriage. No domestic abuse; 5 cats; no children.      Lives: with husband, 5 cats.      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      Tobacco: none       Alcohol: none      Drugs: none      Exercise:  Stair stepper x 30 minutes five days per week.  Spin gym weights daily.  Gardening.      Caffeine: + Moderate caffeine use.          Seatbelt: 100%; no texting while driving.      Guns:  Unloaded gun.      Advanced Directives:  Living Will --- FULL CODE but no prolonged measures.        ADLs: independent with ADLs; no assistant devices; drives.   Family History  Adopted: Yes  Problem Relation Age of Onset  . Hypertension Mother   . Diabetes Mother   . Heart failure Mother   . Heart disease Mother   . Hyperlipidemia Mother   . Stroke Mother   . Mental illness Mother   . Cancer Maternal Grandmother 71       non hodgkins lymphoma  . Cancer Maternal Grandfather   . Deep vein thrombosis Brother        Objective:    BP 132/82   Pulse 87   Temp 98.7 F (37.1 C) (Oral)   Resp 18   Ht 5' 2.21" (1.58 m)   Wt 155 lb (70.3 kg)   LMP 06/22/2000   SpO2 95%   BMI 28.16 kg/m  Physical Exam  Constitutional: She is oriented to person, place, and  time. She appears well-developed and well-nourished. No distress.  HENT:  Head: Normocephalic and atraumatic.  Right Ear: External ear normal.  Left Ear: External ear normal.  Nose: Nose normal.  Mouth/Throat: Oropharynx is clear and moist.  Eyes: Conjunctivae and EOM are normal. Pupils are equal, round, and reactive to light.  Neck: Normal range of motion. Neck supple. Carotid bruit is not present. No  thyromegaly present.  Cardiovascular: Normal rate, regular rhythm, normal heart sounds and intact distal pulses. Exam reveals no gallop and no friction rub.  No murmur heard. Pulmonary/Chest: Effort normal and breath sounds normal. She has no wheezes. She has no rales.  Abdominal: Soft. Bowel sounds are normal. She exhibits no distension and no mass. There is no tenderness. There is no rebound and no guarding.  Lymphadenopathy:    She has no cervical adenopathy.  Neurological: She is alert and oriented to person, place, and time. No cranial nerve deficit.  Skin: Skin is warm and dry. No rash noted. She is not diaphoretic. No erythema. No pallor.  Psychiatric: She has a normal mood and affect. Her behavior is normal.   No results found. Depression screen [facility] 2/9 08/30/2017 03/02/2017 11/11/2016 08/25/2016 02/25/2016  Decreased Interest 0 0 0 0 0  Down, Depressed, Hopeless 0 0 0 0 0  PHQ - 2 Score 0 0 0 0 0   Fall Risk  08/30/2017 03/02/2017 11/11/2016 08/25/2016 02/25/2016  Falls in the past year? No No No No No     Results for orders placed or performed in visit on 08/30/17  CBC with Differential/Platelet  Result Value Ref Range   WBC 3.6 3.4 - 10.8 x10E3/uL   RBC 3.89 3.77 - 5.28 x10E6/uL   Hemoglobin 10.6 (L) 11.1 - 15.9 g/dL   Hematocrit 33.6 (L) 34.0 - 46.6 %   MCV 86 79 - 97 fL   MCH 27.2 26.6 - 33.0 pg   MCHC 31.5 31.5 - 35.7 g/dL   RDW 13.9 12.3 - 15.4 %   Platelets 193 150 - 379 x10E3/uL   Neutrophils 46 Not Estab. %   Lymphs 42 Not Estab. %   Monocytes 6 Not Estab. %   Eos 5 Not Estab. %   Basos 1 Not Estab. %   Neutrophils Absolute 1.7 1.4 - 7.0 x10E3/uL   Lymphocytes Absolute 1.5 0.7 - 3.1 x10E3/uL   Monocytes Absolute 0.2 0.1 - 0.9 x10E3/uL   EOS (ABSOLUTE) 0.2 0.0 - 0.4 x10E3/uL   Basophils Absolute 0.0 0.0 - 0.2 x10E3/uL   Immature Granulocytes 0 Not Estab. %   Immature Grans (Abs) 0.0 0.0 - 0.1 x10E3/uL  Comprehensive metabolic panel  Result Value Ref Range   Glucose  100 (H) 65 - 99 mg/dL   BUN 6 (L) 8 - 27 mg/dL   Creatinine, Ser 0.77 0.57 - 1.00 mg/dL   GFR calc non Af Amer 80 >59 mL/min/1.73   GFR calc Af Amer 92 >59 mL/min/1.73   BUN/Creatinine Ratio 8 (L) 12 - 28   Sodium 139 134 - 144 mmol/L   Potassium 4.6 3.5 - 5.2 mmol/L   Chloride 100 96 - 106 mmol/L   CO2 24 20 - 29 mmol/L   Calcium 9.2 8.7 - 10.3 mg/dL   Total Protein 6.3 6.0 - 8.5 g/dL   Albumin 4.2 3.6 - 4.8 g/dL   Globulin, Total 2.1 1.5 - 4.5 g/dL   Albumin/Globulin Ratio 2.0 1.2 - 2.2   Bilirubin Total 0.2 0.0 - 1.2 mg/dL   Alkaline  Phosphatase 52 39 - 117 IU/L   AST 28 0 - 40 IU/L   ALT 27 0 - 32 IU/L  Lipid panel  Result Value Ref Range   Cholesterol, Total 189 100 - 199 mg/dL   Triglycerides 68 0 - 149 mg/dL   HDL 88 >39 mg/dL   VLDL Cholesterol Cal 14 5 - 40 mg/dL   LDL Calculated 87 0 - 99 mg/dL   Chol/HDL Ratio 2.1 0.0 - 4.4 ratio  POCT glucose (manual entry)  Result Value Ref Range   POC Glucose 101 (A) 70 - 99 mg/dl  POCT glycosylated hemoglobin (Hb A1C)  Result Value Ref Range   Hemoglobin A1C 5.9        Assessment & Plan:   1. Diabetes mellitus without complication ([place])   2. Pure hypercholesterolemia   3. Need for prophylactic vaccination with combined diphtheria-tetanus-pertussis (DTP) vaccine   4. Need for Tdap vaccination   5. Anxiety and depression   6. Chronic migraine without aura without status migrainosus, not intractable   7. Psychophysiological insomnia   8. Malignant neoplasm of upper-inner quadrant of right breast in female, estrogen receptor positive ([place])     Well controlled DMII, hypercholesterolemia; obtain labs for chronic disease management. Decrease Paxil to 20mg  daily. S/p breast bx; fat necrosis on bx.  History of breast cancer. PATIENT REQUESTING TDAP DESPITE INSURANCE NOT COVERING.  Orders Placed This Encounter  Procedures  . Tdap vaccine greater than or equal to 7yo IM  . CBC with Differential/Platelet  . Comprehensive  metabolic panel    Order Specific Question:   Has the patient fasted?    Answer:   No  . Lipid panel    Order Specific Question:   Has the patient fasted?    Answer:   No  . POCT glucose (manual entry)  . POCT glycosylated hemoglobin (Hb A1C)   Meds ordered this encounter  Medications  . misoprostol (CYTOTEC) 200 MCG tablet    Sig: TAKE 1 TABLET BY MOUTH 4 TIMES DAILY.    Dispense:  120 tablet    Refill:  5  . ALPRAZolam (XANAX) 0.5 MG tablet    Sig: TAKE 1 TABLET BY MOUTH ONCE DAILY AT BEDTIME AS NEEDED FOR ANXIETY    Dispense:  30 tablet    Refill:  3  . diclofenac (VOLTAREN) 75 MG EC tablet    Sig: Take 1 tablet (75 mg total) by mouth 2 (two) times daily.    Dispense:  180 tablet    Refill:  3  . atorvastatin (LIPITOR) 20 MG tablet    Sig: Take 1 tablet (20 mg total) by mouth daily.    Dispense:  90 tablet    Refill:  3  . esomeprazole (NEXIUM) 40 MG capsule    Sig: Take 1 capsule (40 mg total) by mouth daily.    Dispense:  90 capsule    Refill:  3  . metFORMIN (GLUCOPHAGE-XR) 500 MG 24 hr tablet    Sig: Take 1 tablet (500 mg total) by mouth 2 (two) times daily.    Dispense:  180 tablet    Refill:  3  . DISCONTD: PARoxetine (PAXIL) 40 MG tablet    Sig: Take 1 tablet (40 mg total) by mouth daily.    Dispense:  90 tablet    Refill:  1  . PARoxetine (PAXIL) 20 MG tablet    Sig: Take 1 tablet (20 mg total) by mouth daily.    Dispense:  90 tablet  Refill:  3    Return in about 6 months (around 03/02/2018) for complete physical examiniation.   Axel Meas Elayne Guerin, M.D. Primary Care at [facility] previously [facility] [address] [place], [place]  [address] [phone] phone [phone] fax

## 2017-08-30 NOTE — Patient Instructions (Addendum)
IF you received an x-ray today, you will receive an invoice from [facility]. Please contact [facility] at [phone] with questions or concerns regarding your invoice.   IF you received labwork today, you will receive an invoice from [facility]. Please contact [facility] at [phone] with questions or concerns regarding your invoice.   Our billing staff will not be able to assist you with questions regarding bills from these companies.  You will be contacted with the lab results as soon as they are available. The fastest way to get your results is to activate your My Chart account. Instructions are located on the last page of this paperwork. If you have not heard from [place] regarding the results in 2 weeks, please contact this office.     Tdap Vaccine (Tetanus, Diphtheria and Pertussis): What You Need to Know 1. Why get vaccinated? Tetanus, diphtheria and pertussis are very serious diseases. Tdap vaccine can protect [place] from these diseases. And, Tdap vaccine given to pregnant women can protect newborn babies against pertussis. TETANUS (Lockjaw) is rare in the [place] today. It causes painful muscle tightening and stiffness, usually all over the body.  It can lead to tightening of muscles in the head and neck so you can't open your mouth, swallow, or sometimes even breathe. Tetanus kills about 1 out of 10 people who are infected even after receiving the best medical care.  DIPHTHERIA is also rare in the [place] today. It can cause a thick coating to form in the back of the throat.  It can lead to breathing problems, heart failure, paralysis, and death.  PERTUSSIS (Whooping Cough) causes severe coughing spells, which can cause difficulty breathing, vomiting and disturbed sleep.  It can also lead to weight loss, incontinence, and rib fractures. Up to 2 in 100 adolescents and 5 in 100 adults with pertussis are hospitalized or have complications, which could  include pneumonia or death.  These diseases are caused by bacteria. Diphtheria and pertussis are spread from person to person through secretions from coughing or sneezing. Tetanus enters the body through cuts, scratches, or wounds. Before vaccines, as many as 200,000 cases of diphtheria, 200,000 cases of pertussis, and hundreds of cases of tetanus, were reported in the [place] each year. Since vaccination began, reports of cases for tetanus and diphtheria have dropped by about 99% and for pertussis by about 80%. 2. Tdap vaccine Tdap vaccine can protect adolescents and adults from tetanus, diphtheria, and pertussis. One dose of Tdap is routinely given at age 22 or 72. People who did not get Tdap at that age should get it as soon as possible. Tdap is especially important for healthcare professionals and anyone having close contact with a baby younger than 12 months. Pregnant women should get a dose of Tdap during every pregnancy, to protect the newborn from pertussis. Infants are most at risk for severe, life-threatening complications from pertussis. Another vaccine, called Td, protects against tetanus and diphtheria, but not pertussis. A Td booster should be given every 10 years. Tdap may be given as one of these boosters if you have never gotten Tdap before. Tdap may also be given after a severe cut or burn to prevent tetanus infection. Your doctor or the person giving you the vaccine can give you more information. Tdap may safely be given at the same time as other vaccines. 3. Some people should not get this vaccine  A person who has ever had a life-threatening allergic reaction after a  previous dose of any diphtheria, tetanus or pertussis containing vaccine, OR has a severe allergy to any part of this vaccine, should not get Tdap vaccine. Tell the person giving the vaccine about any severe allergies.  Anyone who had coma or long repeated seizures within 7 days after a childhood dose of DTP or  DTaP, or a previous dose of Tdap, should not get Tdap, unless a cause other than the vaccine was found. They can still get Td.  Talk to your doctor if you: ? have seizures or another nervous system problem, ? had severe pain or swelling after any vaccine containing diphtheria, tetanus or pertussis, ? ever had a condition called Guillain-Barr Syndrome (GBS), ? aren't feeling well on the day the shot is scheduled. 4. Risks With any medicine, including vaccines, there is a chance of side effects. These are usually mild and go away on their own. Serious reactions are also possible but are rare. Most people who get Tdap vaccine do not have any problems with it. Mild problems following Tdap: (Did not interfere with activities)  Pain where the shot was given (about 3 in 4 adolescents or 2 in 3 adults)  Redness or swelling where the shot was given (about 1 person in 5)  Mild fever of at least 100.79F (up to about 1 in 25 adolescents or 1 in 100 adults)  Headache (about 3 or 4 people in 10)  Tiredness (about 1 person in 3 or 4)  Nausea, vomiting, diarrhea, stomach ache (up to 1 in 4 adolescents or 1 in 10 adults)  Chills, sore joints (about 1 person in 10)  Body aches (about 1 person in 3 or 4)  Rash, swollen glands (uncommon)  Moderate problems following Tdap: (Interfered with activities, but did not require medical attention)  Pain where the shot was given (up to 1 in 5 or 6)  Redness or swelling where the shot was given (up to about 1 in 16 adolescents or 1 in 12 adults)  Fever over 102F (about 1 in 100 adolescents or 1 in 250 adults)  Headache (about 1 in 7 adolescents or 1 in 10 adults)  Nausea, vomiting, diarrhea, stomach ache (up to 1 or 3 people in 100)  Swelling of the entire arm where the shot was given (up to about 1 in 500).  Severe problems following Tdap: (Unable to perform usual activities; required medical attention)  Swelling, severe pain, bleeding and  redness in the arm where the shot was given (rare).  Problems that could happen after any vaccine:  People sometimes faint after a medical procedure, including vaccination. Sitting or lying down for about 15 minutes can help prevent fainting, and injuries caused by a fall. Tell your doctor if you feel dizzy, or have vision changes or ringing in the ears.  Some people get severe pain in the shoulder and have difficulty moving the arm where a shot was given. This happens very rarely.  Any medication can cause a severe allergic reaction. Such reactions from a vaccine are very rare, estimated at fewer than 1 in a million doses, and would happen within a few minutes to a few hours after the vaccination. As with any medicine, there is a very remote chance of a vaccine causing a serious injury or death. The safety of vaccines is always being monitored. For more information, visit: http://www.aguilar.org/ 5. What if there is a serious problem? What should I look for? Look for anything that concerns you, such as signs of  a severe allergic reaction, very high fever, or unusual behavior. Signs of a severe allergic reaction can include hives, swelling of the face and throat, difficulty breathing, a fast heartbeat, dizziness, and weakness. These would usually start a few minutes to a few hours after the vaccination. What should I do?  If you think it is a severe allergic reaction or other emergency that can't wait, call 9-1-1 or get the person to the nearest hospital. Otherwise, call your doctor.  Afterward, the reaction should be reported to the Vaccine Adverse Event Reporting System (VAERS). Your doctor might file this report, or you can do it yourself through the VAERS web site at www.vaers.SamedayNews.es, or by calling 862-887-3102. ? VAERS does not give medical advice. 6. The National Vaccine Injury Compensation Program The [employer] Vaccine Injury Compensation Program (VICP) is a federal program that was  created to compensate people who may have been injured by certain vaccines. Persons who believe they may have been injured by a vaccine can learn about the program and about filing a claim by calling 905-076-9961 or visiting the [place] website at GoldCloset.com.ee. There is a time limit to file a claim for compensation. 7. How can I learn more?  Ask your doctor. He or she can give you the vaccine package insert or suggest other sources of information.  Call your local or state health department.  Contact the Centers for Disease Control and Prevention (CDC): ? Call (620)827-4654 (1-800-CDC-INFO) or ? Visit CDC's website at http://hunter.com/ CDC Tdap Vaccine VIS (08/15/13) This information is not intended to replace advice given to you by your health care provider. Make sure you discuss any questions you have with your health care provider. Document Released: 12/08/2011 Document Revised: 02/27/2016 Document Reviewed: 02/27/2016 Elsevier Interactive Patient Education  2017 [employer].

## 2017-08-31 ENCOUNTER — Encounter: Payer: Self-pay | Admitting: Family Medicine

## 2017-11-16 ENCOUNTER — Encounter: Payer: Self-pay | Admitting: Family Medicine

## 2017-12-23 DIAGNOSIS — S61031A Puncture wound without foreign body of right thumb without damage to nail, initial encounter: Secondary | ICD-10-CM | POA: Diagnosis not present

## 2018-01-26 DIAGNOSIS — L2089 Other atopic dermatitis: Secondary | ICD-10-CM | POA: Diagnosis not present

## 2018-02-12 DIAGNOSIS — L255 Unspecified contact dermatitis due to plants, except food: Secondary | ICD-10-CM | POA: Diagnosis not present

## 2018-03-02 DIAGNOSIS — M19041 Primary osteoarthritis, right hand: Secondary | ICD-10-CM | POA: Diagnosis not present

## 2018-03-09 ENCOUNTER — Encounter: Payer: Self-pay | Admitting: Family Medicine

## 2018-03-09 ENCOUNTER — Other Ambulatory Visit: Payer: Self-pay

## 2018-03-09 ENCOUNTER — Ambulatory Visit (INDEPENDENT_AMBULATORY_CARE_PROVIDER_SITE_OTHER): Payer: Medicare Other | Admitting: Family Medicine

## 2018-03-09 ENCOUNTER — Ambulatory Visit: Payer: Medicare Other | Admitting: Family Medicine

## 2018-03-09 VITALS — BP 113/73 | HR 71 | Temp 99.3°F | Resp 16 | Ht 62.21 in | Wt 154.2 lb

## 2018-03-09 DIAGNOSIS — F329 Major depressive disorder, single episode, unspecified: Secondary | ICD-10-CM | POA: Diagnosis not present

## 2018-03-09 DIAGNOSIS — E119 Type 2 diabetes mellitus without complications: Secondary | ICD-10-CM

## 2018-03-09 DIAGNOSIS — E7800 Pure hypercholesterolemia, unspecified: Secondary | ICD-10-CM | POA: Diagnosis not present

## 2018-03-09 DIAGNOSIS — K219 Gastro-esophageal reflux disease without esophagitis: Secondary | ICD-10-CM | POA: Diagnosis not present

## 2018-03-09 DIAGNOSIS — F419 Anxiety disorder, unspecified: Secondary | ICD-10-CM | POA: Diagnosis not present

## 2018-03-09 DIAGNOSIS — M79642 Pain in left hand: Secondary | ICD-10-CM | POA: Diagnosis not present

## 2018-03-09 DIAGNOSIS — M79641 Pain in right hand: Secondary | ICD-10-CM | POA: Diagnosis not present

## 2018-03-09 LAB — COMPREHENSIVE METABOLIC PANEL
A/G RATIO: 2 (ref 1.2–2.2)
ALBUMIN: 4.5 g/dL (ref 3.6–4.8)
ALT: 23 IU/L (ref 0–32)
AST: 24 IU/L (ref 0–40)
Alkaline Phosphatase: 52 IU/L (ref 39–117)
BILIRUBIN TOTAL: 0.3 mg/dL (ref 0.0–1.2)
BUN / CREAT RATIO: 10 — AB (ref 12–28)
BUN: 7 mg/dL — AB (ref 8–27)
CALCIUM: 9.5 mg/dL (ref 8.7–10.3)
CO2: 24 mmol/L (ref 20–29)
Chloride: 101 mmol/L (ref 96–106)
Creatinine, Ser: 0.72 mg/dL (ref 0.57–1.00)
GFR, EST AFRICAN AMERICAN: 99 mL/min/{1.73_m2} (ref >59)
GFR, EST NON AFRICAN AMERICAN: 86 mL/min/{1.73_m2} (ref >59)
GLUCOSE: 92 mg/dL (ref 65–99)
Globulin, Total: 2.2 g/dL (ref 1.5–4.5)
Potassium: 4.3 mmol/L (ref 3.5–5.2)
Sodium: 139 mmol/L (ref 134–144)
TOTAL PROTEIN: 6.7 g/dL (ref 6.0–8.5)

## 2018-03-09 LAB — HEMOGLOBIN A1C
Est. average glucose Bld gHb Est-mCnc: 120 mg/dL
Hgb A1c MFr Bld: 5.8 % — ABNORMAL HIGH (ref 4.8–5.6)

## 2018-03-09 MED ORDER — ESOMEPRAZOLE MAGNESIUM 40 MG PO CPDR: 40.0000 mg | DELAYED_RELEASE_CAPSULE | Freq: Every day | ORAL | 3 refills | Status: DC

## 2018-03-09 MED ORDER — DICLOFENAC SODIUM 75 MG PO TBEC: 75.0000 mg | DELAYED_RELEASE_TABLET | Freq: Two times a day (BID) | ORAL | 3 refills | Status: DC

## 2018-03-09 MED ORDER — METFORMIN HCL ER 500 MG PO TB24: 500.0000 mg | ORAL_TABLET | Freq: Two times a day (BID) | ORAL | 3 refills | Status: DC

## 2018-03-09 MED ORDER — ATORVASTATIN CALCIUM 20 MG PO TABS: 20.0000 mg | ORAL_TABLET | Freq: Every day | ORAL | 3 refills | Status: DC

## 2018-03-09 MED ORDER — MISOPROSTOL 200 MCG PO TABS: ORAL_TABLET | ORAL | 5 refills | Status: DC

## 2018-03-09 NOTE — Progress Notes (Signed)
Chief Complaint  Patient presents with  . Transitions Of Care ( former Alisha Matthews pt)    HPI  Diabetes Mellitus: Patient presents for follow up of diabetes.  She is a former patient of Dr. Tamala Matthews who needs to establish with a new provider for her diabetes mgmt.  She takes metformin bid and has been having good numbers and good a1cs. She has not side effects. Patient denies foot ulcerations, hyperglycemia, hypoglycemia , nausea and vomitting.  Evaluation to date has been included: hemoglobin A1C.   Lab Results  Component Value Date   HGBA1C 5.8 (H) 03/09/2018   Anxiety and Depression She also has anxiety and depression She is on Paxil 40mg  She does not miss any doses and is aware of the withdrawal risks Depression screen [facility] 2/9 03/09/2018 08/30/2017 03/02/2017 11/11/2016 08/25/2016  Decreased Interest 0 0 0 0 0  Down, Depressed, Hopeless 0 0 0 0 0  PHQ - 2 Score 0 0 0 0 0   GERD She has reflux that is managed by esmoprazole She needs a refill  She avoids food triggers She denies melena, hematochezia  Bilateral hand pain and arthritis She reports that she would like refill of her prostaglandin medication She uses misoprostol and voltare She takes a PPI    Past Medical History:  Diagnosis Date  . Anemia, unspecified   . Anxiety   . Arthritis   . Asthma   . Barrett's esophagus   . Breast cancer ([place])    right breast invasive ductal carcinoma   . Breast fibrocystic disorder   . Bronchitis   . Chicken pox   . Constipation, chronic   . Depressive disorder, not elsewhere classified   . Diabetes mellitus    borderline  . Essential hypertension, benign   . Exercise induced bronchospasm   . Genital warts   . GERD (gastroesophageal reflux disease)   . Hyperlipidemia   . Infected postoperative breast seroma   . Insomnia, unspecified   . Leukocytopenia, unspecified   . Measles   . Migraine, unspecified, without mention of intractable migraine without mention of status migrainosus    . Mumps   . Obesity, unspecified   . Osteoarthrosis, unspecified whether generalized or localized, ankle and foot    ankle/foot  . Personal history of colonic polyps   . Symptomatic menopausal or female climacteric states     Current Outpatient Medications  Medication Sig Dispense Refill  . albuterol (PROVENTIL HFA;VENTOLIN HFA) 108 (90 Base) MCG/ACT inhaler Inhale 2 puffs into the lungs every 6 (six) hours as needed. Asthmatic attack 18 g 3  . ALPRAZolam (XANAX) 0.5 MG tablet TAKE 1 TABLET BY MOUTH ONCE DAILY AT BEDTIME AS NEEDED FOR ANXIETY 30 tablet 3  . atorvastatin (LIPITOR) 20 MG tablet Take 1 tablet (20 mg total) by mouth daily. 90 tablet 3  . Calcium-Magnesium-Vitamin D (CALCIUM 500 PO) Take 500 mg by mouth 3 (three) times daily.    . clindamycin (CLEOCIN) 150 MG capsule Take 600 mg by mouth as needed (dental work).     . Coenzyme Q10 (COQ10) 200 MG CAPS Take 200 mg by mouth daily.    . diclofenac (VOLTAREN) 75 MG EC tablet Take 1 tablet (75 mg total) by mouth 2 (two) times daily. 180 tablet 3  . Diindolylmethane POWD 1 Package by Does not apply route daily. Reported on 09/10/2015    . diphenhydrAMINE (BENADRYL) 25 MG tablet Take 25 mg by mouth at bedtime as needed for sleep. When not taking xanax.    Marland Kitchen  esomeprazole (NEXIUM) 40 MG capsule Take 1 capsule (40 mg total) by mouth daily. 90 capsule 3  . frovatriptan (FROVA) 2.5 MG tablet Take 1 tablet (2.5 mg total) by mouth as needed for migraine (max 3 tabs in 24 hours). If recurs, may repeat after 2 hours. Max of 3 tabs in 24 hours. 10 tablet 1  . LevOCARNitine (CARNITINE PO) Take 3,000 mg by mouth daily.     . metFORMIN (GLUCOPHAGE-XR) 500 MG 24 hr tablet Take 1 tablet (500 mg total) by mouth 2 (two) times daily. 180 tablet 3  . methocarbamol (ROBAXIN) 500 MG tablet Take 1 tablet (500 mg total) by mouth every 8 (eight) hours as needed for muscle spasms. (Patient taking differently: Take 750 mg by mouth every 8 (eight) hours as needed  for muscle spasms. ) 50 tablet 0  . misoprostol (CYTOTEC) 200 MCG tablet TAKE 1 TABLET BY MOUTH 4 TIMES DAILY. 120 tablet 5  . Multiple Vitamins-Minerals (ANTIOXIDANT PO) Take 1 capsule by mouth daily. Fruit    . OVER THE COUNTER MEDICATION Take 1 capsule by mouth daily. Procast package: Elite-100 omega 3 vitamin    . OVER THE COUNTER MEDICATION Take 1 capsule by mouth daily. Procast: Memory,brain and liver vitamin    . OVER THE COUNTER MEDICATION Take 1 capsule by mouth daily. Circulation and vein support vitamin    . PARoxetine (PAXIL) 20 MG tablet Take 1 tablet (20 mg total) by mouth daily. (Patient taking differently: Take 40 mg by mouth daily. ) 90 tablet 3  . PRESCRIPTION MEDICATION Reported on 09/10/2015    . Probiotic Product (PROBIOTIC DAILY PO) Take 1 capsule by mouth daily.    Marland Kitchen selenium sulfide (SELSUN) 2.5 % shampoo Apply 1 application topically daily as needed for irritation.    . simethicone (MYLICON) 80 MG chewable tablet Chew 80 mg by mouth every 6 (six) hours as needed for flatulence.    . terbinafine (LAMISIL) 250 MG tablet Take 1 tablet (250 mg total) by mouth daily. 14 tablet 0  . docusate sodium (COLACE) 100 MG capsule Take 100 mg by mouth 2 (two) times daily. Reported on 09/10/2015     No current facility-administered medications for this visit.     Allergies:  Allergies  Allergen Reactions  . Penicillins Rash    Past Surgical History:  Procedure Laterality Date  . BREAST LUMPECTOMY WITH NEEDLE LOCALIZATION AND AXILLARY SENTINEL LYMPH NODE BX  06/08/2012   Procedure: BREAST LUMPECTOMY WITH NEEDLE LOCALIZATION AND AXILLARY SENTINEL LYMPH NODE BX;  Surgeon: Alisha Bookbinder, MD;  Location: [place];  Service: General;  Laterality: Right;  . BREAST SURGERY    . BUNIONECTOMY     3 surgeries on both feet  . CHOLECYSTECTOMY    . COLONOSCOPY    . DILATION AND CURETTAGE OF UTERUS  1978  . EYE SURGERY     Orbital right eye surgery  . FOOT SURGERY      Left-revision  . JOINT REPLACEMENT    . NASAL SEPTUM SURGERY    . RE-EXCISION OF BREAST CANCER,SUPERIOR MARGINS  06/27/2012   Procedure: RE-EXCISION OF BREAST CANCER,SUPERIOR MARGINS;  Surgeon: Alisha Bookbinder, MD;  Location: [place];  Service: General;  Laterality: Right;  . TOTAL HIP ARTHROPLASTY     left      Perthes disease  . TOTAL KNEE ARTHROPLASTY Left 02/20/2014   Procedure: TOTAL KNEE ARTHROPLASTY;  Surgeon: Hessie Dibble, MD;  Location: [place];  Service: Orthopedics;  Laterality:  Left;    Social History   Socioeconomic History  . Marital status: Married    Spouse name: Shanon Brow  . Number of children: 0  . Years of education: college  . Highest education level: Not on file  Occupational History  . Occupation: retired    Fish farm manager: [employer]    Comment: previous [place] at [employer]  . Financial resource strain: Not on file  . Food insecurity:    Worry: Not on file    Inability: Not on file  . Transportation needs:    Medical: Not on file    Non-medical: Not on file  Tobacco Use  . Smoking status: Never Smoker  . Smokeless tobacco: Never Used  Substance and Sexual Activity  . Alcohol use: No  . Drug use: No  . Sexual activity: Never    Partners: Male    Birth control/protection: Post-menopausal    Comment: post menopausal  Lifestyle  . Physical activity:    Days per week: Not on file    Minutes per session: Not on file  . Stress: Not on file  Relationships  . Social connections:    Talks on phone: Not on file    Gets together: Not on file    Attends religious service: Not on file    Active member of club or organization: Not on file    Attends meetings of clubs or organizations: Not on file    Relationship status: Not on file  Other Topics Concern  . Not on file  Social History Narrative   Marital status:  Married x 19 years happily, second marriage. No domestic abuse; 5 cats; no children.      Lives: with husband, 5 cats.       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      Tobacco: none       Alcohol: none      Drugs: none      Exercise:  Stair stepper x 30 minutes five days per week.  Spin gym weights daily.  Gardening.      Caffeine: + Moderate caffeine use.          Seatbelt: 100%; no texting while driving.      Guns:  Unloaded gun.      Advanced Directives:  Living Will --- FULL CODE but no prolonged measures.        ADLs: independent with ADLs; no assistant devices; drives.    Family History  Adopted: Yes  Problem Relation Age of Onset  . Hypertension Mother   . Diabetes Mother   . Heart failure Mother   . Heart disease Mother   . Hyperlipidemia Mother   . Stroke Mother   . Mental illness Mother   . Cancer Maternal Grandmother 25       non hodgkins lymphoma  . Cancer Maternal Grandfather   . Deep vein thrombosis Brother      ROS Review of Systems See HPI Constitution: No fevers or chills No malaise No diaphoresis Skin: No rash or itching Eyes: no blurry vision, no double vision GU: no dysuria or hematuria Neuro: no dizziness or headaches  all others reviewed and negative   Objective: Vitals:   03/09/18 0855  BP: 113/73  Pulse: 71  Resp: 16  Temp: 99.3 F (37.4 C)  TempSrc: Oral  SpO2: 98%  Weight: 154 lb 3.2 oz (69.9 kg)  Height: 5' 2.21" (  1.58 m)    Physical Exam  Constitutional: She is oriented to person, place, and time. She appears well-developed and well-nourished.  HENT:  Head: Normocephalic and atraumatic.  Eyes: Conjunctivae and EOM are normal.  Cardiovascular: Normal rate, regular rhythm and normal heart sounds.  Pulmonary/Chest: Effort normal and breath sounds normal. No stridor. No respiratory distress. She has no wheezes.  Neurological: She is alert and oriented to person, place, and time.  Skin: Skin is warm. Capillary refill takes less than 2 seconds.  Psychiatric: She has a normal mood and affect. Her  behavior is normal. Judgment and thought content normal.    Assessment and Plan Alisha Matthews was seen today for transitions of care ( former smith pt).  Diagnoses and all orders for this visit:  Diabetes mellitus without complication ([place])-  well controlled hemoglobin a1c is at goal Continue exercise Lipids monitored and renal function in range On metformin ace or arb not indicated  Reviewed diabetic foot care Emphasized importance of eye and dental exam   -     Comprehensive metabolic panel -     Hemoglobin A1c -     atorvastatin (LIPITOR) 20 MG tablet; Take 1 tablet (20 mg total) by mouth daily. -     metFORMIN (GLUCOPHAGE-XR) 500 MG 24 hr tablet; Take 1 tablet (500 mg total) by mouth 2 (two) times daily.  Anxiety and depression- stable on Paxil, reviewed SSRI withdrawals and advised to continue compliance  Bilateral hand pain- stable on current meds without signs of PUD -     diclofenac (VOLTAREN) 75 MG EC tablet; Take 1 tablet (75 mg total) by mouth 2 (two) times daily. -     misoprostol (CYTOTEC) 200 MCG tablet; TAKE 1 TABLET BY MOUTH 4 TIMES DAILY.  Gastroesophageal reflux disease without esophagitis- refilled PPI today, continue current meds -     esomeprazole (NEXIUM) 40 MG capsule; Take 1 capsule (40 mg total) by mouth daily.  Pure hypercholesterolemia- stable, reviewed Lipid Panel, refilled medication  -     atorvastatin (LIPITOR) 20 MG tablet; Take 1 tablet (20 mg total) by mouth daily.     [place]

## 2018-03-09 NOTE — Patient Instructions (Addendum)
   If you have lab work done today you will be contacted with your lab results within the next 2 weeks.  If you have not heard from us then please contact us. The fastest way to get your results is to register for My Chart.   IF you received an x-ray today, you will receive an invoice from [facility]. Please contact [facility] at [phone] with questions or concerns regarding your invoice.   IF you received labwork today, you will receive an invoice from [facility]. Please contact [facility] at [phone] with questions or concerns regarding your invoice.   Our billing staff will not be able to assist you with questions regarding bills from these companies.  You will be contacted with the lab results as soon as they are available. The fastest way to get your results is to activate your My Chart account. Instructions are located on the last page of this paperwork. If you have not heard from us regarding the results in 2 weeks, please contact this office.     Diabetes Mellitus and Standards of Medical Care Managing diabetes (diabetes mellitus) can be complicated. Your diabetes treatment may be managed by a team of health care providers, including:  A diet and nutrition specialist (registered dietitian).  A nurse.  A certified diabetes educator (CDE).  A diabetes specialist (endocrinologist).  An eye doctor.  A primary care provider.  A dentist.  Your health care providers follow a schedule in order to help you get the best quality of care. The following schedule is a general guideline for your diabetes management plan. Your health care providers may also give you more specific instructions. HbA1c ( hemoglobin A1c) test This test provides information about blood sugar (glucose) control over the previous 2-3 months. It is used to check whether your diabetes management plan needs to be adjusted.  If you are meeting your treatment goals, this test is done at  least 2 times a year.  If you are not meeting treatment goals or if your treatment goals have changed, this test is done 4 times a year.  Blood pressure test  This test is done at every routine medical visit. For most people, the goal is less than 130/80. Ask your health care provider what your goal blood pressure should be. Dental and eye exams  Visit your dentist two times a year.  If you have type 1 diabetes, get an eye exam 3-5 years after you are diagnosed, and then once a year after your first exam. ? If you were diagnosed with type 1 diabetes as a child, get an eye exam when you are age 10 or older and have had diabetes for 3-5 years. After the first exam, you should get an eye exam once a year.  If you have type 2 diabetes, have an eye exam as soon as you are diagnosed, and then once a year after your first exam. Foot care exam  Visual foot exams are done at every routine medical visit. The exams check for cuts, bruises, redness, blisters, sores, or other problems with the feet.  A complete foot exam is done by your health care provider once a year. This exam includes an inspection of the structure and skin of your feet, and a check of the pulses and sensation in your feet. ? Type 1 diabetes: Get your first exam 3-5 years after diagnosis. ? Type 2 diabetes: Get your first exam as soon as you are diagnosed.  Check your   feet every day for cuts, bruises, redness, blisters, or sores. If you have any of these or other problems that are not healing, contact your health care provider. Kidney function test ( urine microalbumin)  This test is done once a year. ? Type 1 diabetes: Get your first test 5 years after diagnosis. ? Type 2 diabetes: Get your first test as soon as you are diagnosed.  If you have chronic kidney disease (CKD), get a serum creatinine and estimated glomerular filtration rate (eGFR) test once a year. Lipid profile (cholesterol, HDL, LDL, triglycerides)  This test  should be done when you are diagnosed with diabetes, and every 5 years after the first test. If you are on medicines to lower your cholesterol, you may need to get this test done every year. ? The goal for LDL is less than 100 mg/dL (5.5 mmol/L). If you are at high risk, the goal is less than 70 mg/dL (3.9 mmol/L). ? The goal for HDL is 40 mg/dL (2.2 mmol/L) for men and 50 mg/dL(2.8 mmol/L) for women. An HDL cholesterol of 60 mg/dL (3.3 mmol/L) or higher gives some protection against heart disease. ? The goal for triglycerides is less than 150 mg/dL (8.3 mmol/L). Immunizations  The yearly flu (influenza) vaccine is recommended for everyone 6 months or older who has diabetes.  The pneumonia (pneumococcal) vaccine is recommended for everyone 2 years or older who has diabetes. If you are 65 or older, you may get the pneumonia vaccine as a series of two separate shots.  The hepatitis B vaccine is recommended for adults shortly after they have been diagnosed with diabetes.  The Tdap (tetanus, diphtheria, and pertussis) vaccine should be given: ? According to normal childhood vaccination schedules, for children. ? Every 10 years, for adults who have diabetes.  The shingles vaccine is recommended for people who have had chicken pox and are 50 years or older. Mental and emotional health  Screening for symptoms of eating disorders, anxiety, and depression is recommended at the time of diagnosis and afterward as needed. If your screening shows that you have symptoms (you have a positive screening result), you may need further evaluation and be referred to a mental health care provider. Diabetes self-management education  Education about how to manage your diabetes is recommended at diagnosis and ongoing as needed. Treatment plan  Your treatment plan will be reviewed at every medical visit. Summary  Managing diabetes (diabetes mellitus) can be complicated. Your diabetes treatment may be managed by a  team of health care providers.  Your health care providers follow a schedule in order to help you get the best quality of care.  Standards of care including having regular physical exams, blood tests, blood pressure monitoring, immunizations, screening tests, and education about how to manage your diabetes.  Your health care providers may also give you more specific instructions based on your individual health. This information is not intended to replace advice given to you by your health care provider. Make sure you discuss any questions you have with your health care provider. Document Released: 04/05/2009 Document Revised: 03/06/2016 Document Reviewed: 03/06/2016 Elsevier Interactive Patient Education  2018 Elsevier Inc.  

## 2018-04-06 DIAGNOSIS — Z23 Encounter for immunization: Secondary | ICD-10-CM | POA: Diagnosis not present

## 2018-04-27 DIAGNOSIS — M25562 Pain in left knee: Secondary | ICD-10-CM | POA: Diagnosis not present

## 2018-04-27 DIAGNOSIS — M25552 Pain in left hip: Secondary | ICD-10-CM | POA: Diagnosis not present

## 2018-04-27 DIAGNOSIS — Z96652 Presence of left artificial knee joint: Secondary | ICD-10-CM | POA: Diagnosis not present

## 2018-04-27 DIAGNOSIS — Z09 Encounter for follow-up examination after completed treatment for conditions other than malignant neoplasm: Secondary | ICD-10-CM | POA: Diagnosis not present

## 2018-04-27 DIAGNOSIS — Z96642 Presence of left artificial hip joint: Secondary | ICD-10-CM | POA: Diagnosis not present

## 2018-05-04 ENCOUNTER — Encounter: Payer: Self-pay | Admitting: Family Medicine

## 2018-05-04 DIAGNOSIS — E119 Type 2 diabetes mellitus without complications: Secondary | ICD-10-CM | POA: Diagnosis not present

## 2018-05-05 DIAGNOSIS — L539 Erythematous condition, unspecified: Secondary | ICD-10-CM | POA: Diagnosis not present

## 2018-06-09 DIAGNOSIS — R921 Mammographic calcification found on diagnostic imaging of breast: Secondary | ICD-10-CM | POA: Diagnosis not present

## 2018-06-09 DIAGNOSIS — Z853 Personal history of malignant neoplasm of breast: Secondary | ICD-10-CM | POA: Diagnosis not present

## 2018-06-09 LAB — HM MAMMOGRAPHY

## 2018-06-10 DIAGNOSIS — M79671 Pain in right foot: Secondary | ICD-10-CM | POA: Diagnosis not present

## 2018-06-10 DIAGNOSIS — M79672 Pain in left foot: Secondary | ICD-10-CM | POA: Diagnosis not present

## 2018-06-10 DIAGNOSIS — J0100 Acute maxillary sinusitis, unspecified: Secondary | ICD-10-CM | POA: Diagnosis not present

## 2018-06-27 ENCOUNTER — Telehealth: Payer: Self-pay | Admitting: *Deleted

## 2018-06-27 DIAGNOSIS — M79642 Pain in left hand: Principal | ICD-10-CM

## 2018-06-27 DIAGNOSIS — M79641 Pain in right hand: Secondary | ICD-10-CM

## 2018-06-27 NOTE — Telephone Encounter (Signed)
Pharmacy called and stated the Cytotec needs to be changed to Twice a day instead of 4 times a day.  Gave a verbal to ok , stated that would call back if wanted it 4 times but stated with the Voltaren it should be two times.   If two times is ok do not need to call pharmacy.  Please advise.

## 2018-06-30 MED ORDER — MISOPROSTOL 200 MCG PO TABS: ORAL_TABLET | ORAL | 5 refills | Status: DC

## 2018-06-30 NOTE — Telephone Encounter (Signed)
Agreed. Updated med dosing.

## 2018-06-30 NOTE — Addendum Note (Signed)
Addended by: Delia Chimes A on: 06/30/2018 06:18 PM   Modules accepted: Orders

## 2018-07-19 ENCOUNTER — Telehealth: Payer: Self-pay | Admitting: Family Medicine

## 2018-07-19 NOTE — Telephone Encounter (Signed)
LVM for pt advising of the cancellation of the AWV due to Dr. Nolon Rod being out of town. I advised that Almyra Free would be calling pt to reschedule the appt.   Almyra Free, Can you call this pt and reschedule this pt for her AWV? Thank you!   Alisha Matthews

## 2018-07-21 NOTE — Telephone Encounter (Signed)
Spoke with patient she wanted an appointment with the doctor and did not want to schedule AWV was transferred to reschedule

## 2018-08-30 ENCOUNTER — Encounter: Payer: Self-pay | Admitting: Family Medicine

## 2018-08-30 ENCOUNTER — Other Ambulatory Visit: Payer: Self-pay

## 2018-08-30 ENCOUNTER — Ambulatory Visit (INDEPENDENT_AMBULATORY_CARE_PROVIDER_SITE_OTHER): Payer: Medicare Other | Admitting: Family Medicine

## 2018-08-30 VITALS — BP 122/76 | HR 82 | Temp 98.9°F | Resp 18 | Ht 62.21 in | Wt 159.4 lb

## 2018-08-30 DIAGNOSIS — M79642 Pain in left hand: Secondary | ICD-10-CM | POA: Diagnosis not present

## 2018-08-30 DIAGNOSIS — K219 Gastro-esophageal reflux disease without esophagitis: Secondary | ICD-10-CM | POA: Diagnosis not present

## 2018-08-30 DIAGNOSIS — M8589 Other specified disorders of bone density and structure, multiple sites: Secondary | ICD-10-CM | POA: Diagnosis not present

## 2018-08-30 DIAGNOSIS — E7800 Pure hypercholesterolemia, unspecified: Secondary | ICD-10-CM | POA: Diagnosis not present

## 2018-08-30 DIAGNOSIS — E119 Type 2 diabetes mellitus without complications: Secondary | ICD-10-CM | POA: Diagnosis not present

## 2018-08-30 DIAGNOSIS — M79641 Pain in right hand: Secondary | ICD-10-CM | POA: Diagnosis not present

## 2018-08-30 DIAGNOSIS — D649 Anemia, unspecified: Secondary | ICD-10-CM

## 2018-08-30 MED ORDER — DICLOFENAC SODIUM 75 MG PO TBEC: 75.0000 mg | DELAYED_RELEASE_TABLET | Freq: Two times a day (BID) | ORAL | 3 refills | Status: DC

## 2018-08-30 MED ORDER — MISOPROSTOL 200 MCG PO TABS: ORAL_TABLET | ORAL | 5 refills | Status: DC

## 2018-08-30 MED ORDER — ESOMEPRAZOLE MAGNESIUM 40 MG PO CPDR: 40.0000 mg | DELAYED_RELEASE_CAPSULE | Freq: Every day | ORAL | 3 refills | Status: DC

## 2018-08-30 MED ORDER — ALPRAZOLAM 0.5 MG PO TABS: ORAL_TABLET | ORAL | 2 refills | Status: DC

## 2018-08-30 MED ORDER — ATORVASTATIN CALCIUM 20 MG PO TABS: 20.0000 mg | ORAL_TABLET | Freq: Every day | ORAL | 3 refills | Status: DC

## 2018-08-30 MED ORDER — METFORMIN HCL ER 500 MG PO TB24: 500.0000 mg | ORAL_TABLET | Freq: Two times a day (BID) | ORAL | 3 refills | Status: DC

## 2018-08-30 MED ORDER — PAROXETINE HCL 20 MG PO TABS: 20.0000 mg | ORAL_TABLET | Freq: Every day | ORAL | 3 refills | Status: DC

## 2018-08-30 NOTE — Progress Notes (Signed)
Established Patient Office Visit  Subjective:  Patient ID: Alisha Matthews, female    DOB: 14-May-1951  Age: 68 y.o. MRN: [id number]  CC:  Chief Complaint  Patient presents with  . Diabetes    follow up   . Medication Refill    all meds on paper script    HPI Alisha Matthews presents for   Diabetes Mellitus: Patient presents for follow up of diabetes. Symptoms: none. Symptoms have stabilized. Patient denies hyperglycemia, hypoglycemia , increase appetite, nausea, paresthesia of the feet, polydipsia, polyuria and visual disturbances.  Evaluation to date has been included: hemoglobin A1C.  Home sugars: patient does not check sugars. Treatment to date: DIET CONTROLLED  Lab Results  Component Value Date   HGBA1C 5.8 (H) 03/09/2018   Mild anemia-   Lab Results  Component Value Date   WBC 3.6 08/30/2017   HGB 10.6 (L) 08/30/2017   HCT 33.6 (L) 08/30/2017   MCV 86 08/30/2017   PLT 193 08/30/2017   She reports that she always has a low hemglobin She denies GI bleed,no changes in her bowel movement, no black tarry stools She denies hair loss or shedding She denies palpitaitons     Osteopenia She is taking vitamin D She is exercising by walking, yard work and stair stepper as well as free weights She takes a Ca- Mg- Vitamin D supplement  PTSD/ANXIETY Pt takes benadryl alternating with alprazolam 0.14m at bedtime She is alo on Paxil 265mdaily No withdrawal from Paxil [facility] 08/30/2018 03/09/2018 08/30/2017 03/02/2017 11/11/2016  Decreased Interest 0 0 0 0 0  Down, Depressed, Hopeless 0 0 0 0 0  PHQ - 2 Score 0 0 0 0 0   No suicidal thoughts      Past Medical History:  Diagnosis Date  . Anemia, unspecified   . Anxiety   . Arthritis   . Asthma   . Barrett's esophagus   . [place]   right breast invasive ductal carcinoma   . Breast fibrocystic disorder   . Bronchitis   . Chicken pox   . Constipation, chronic   . Depressive disorder, not  elsewhere classified   . Diabetes mellitus    borderline  . Essential hypertension, benign   . Exercise induced bronchospasm   . Genital warts   . GERD (gastroesophageal reflux disease)   . Hyperlipidemia   . Infected postoperative breast seroma   . Insomnia, unspecified   . Leukocytopenia, unspecified   . Measles   . Migraine, unspecified, without mention of intractable migraine without mention of status migrainosus   . Mumps   . Obesity, unspecified   . Osteoarthrosis, unspecified whether generalized or localized, ankle and foot    ankle/foot  . Personal history of colonic polyps   . Symptomatic menopausal or female climacteric states     Past Surgical History:  Procedure Laterality Date  . BREAST LUMPECTOMY WITH NEEDLE LOCALIZATION AND AXILLARY SENTINEL LYMPH NODE BX  06/08/2012   Procedure: BREAST LUMPECTOMY WITH NEEDLE LOCALIZATION AND AXILLARY SENTINEL LYMPH NODE BX;  Surgeon: MaRolm BookbinderMD;  [facility] Service: General;  Laterality: Right;  . BREAST SURGERY    . BUNIONECTOMY     3 surgeries on both feet  . CHOLECYSTECTOMY    . COLONOSCOPY    . DILATION AND CURETTAGE OF UTERUS  1978  . EYE SURGERY     Orbital right eye surgery  . FOOT SURGERY  Left-revision  . JOINT REPLACEMENT    . NASAL SEPTUM SURGERY    . RE-EXCISION OF BREAST CANCER,SUPERIOR MARGINS  06/27/2012   Procedure: RE-EXCISION OF BREAST CANCER,SUPERIOR MARGINS;  Surgeon: Rolm Bookbinder, MD;  Location: [place];  Service: General;  Laterality: Right;  . TOTAL HIP ARTHROPLASTY     left      Perthes disease  . TOTAL KNEE ARTHROPLASTY Left 02/20/2014   Procedure: TOTAL KNEE ARTHROPLASTY;  Surgeon: Hessie Dibble, MD;  Location: [facility];  Service: Orthopedics;  Laterality: Left;    Family History  Adopted: Yes  Problem Relation Age of Onset  . Hypertension Mother   . Diabetes Mother   . Heart failure Mother   . Heart disease Mother   .  Hyperlipidemia Mother   . Stroke Mother   . Mental illness Mother   . Cancer Maternal Grandmother 71       non hodgkins lymphoma  . Cancer Maternal Grandfather   . Deep vein thrombosis Brother     Social History   Socioeconomic History  . Marital status: Married    Spouse name: Shanon Brow  . Number of children: 0  . Years of education: college  . Highest education level: Not on file  Occupational History  . Occupation: retired    Fish farm manager: [place]    Comment: previous [place] at [employer]  . Financial resource strain: Not on file  . Food insecurity:    Worry: Not on file    Inability: Not on file  . Transportation needs:    Medical: Not on file    Non-medical: Not on file  Tobacco Use  . Smoking status: Never Smoker  . Smokeless tobacco: Never Used  Substance and Sexual Activity  . Alcohol use: No  . Drug use: No  . Sexual activity: Never    Partners: Male    Birth control/protection: Post-menopausal    Comment: post menopausal  Lifestyle  . Physical activity:    Days per week: Not on file    Minutes per session: Not on file  . Stress: Not on file  Relationships  . Social connections:    Talks on phone: Not on file    Gets together: Not on file    Attends religious service: Not on file    Active member of club or organization: Not on file    Attends meetings of clubs or organizations: Not on file    Relationship status: Not on file  . Intimate partner violence:    Fear of current or ex partner: Not on file    Emotionally abused: Not on file    Physically abused: Not on file    Forced sexual activity: Not on file  Other Topics Concern  . Not on file  Social History Narrative   Marital status:  Married x 19 years happily, second marriage. No domestic abuse; 5 cats; no children.      Lives: with husband, 5 cats.      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      Tobacco: none       Alcohol: none       Drugs: none      Exercise:  Stair stepper x 30 minutes five days per week.  Spin gym weights daily.  Gardening.      Caffeine: + Moderate caffeine use.  Seatbelt: 100%; no texting while driving.      Guns:  Unloaded gun.      Advanced Directives:  Living Will --- FULL CODE but no prolonged measures.        ADLs: independent with ADLs; no assistant devices; drives.    Outpatient Medications Prior to Visit  Medication Sig Dispense Refill  . albuterol (PROVENTIL HFA;VENTOLIN HFA) 108 (90 Base) MCG/ACT inhaler Inhale 2 puffs into the lungs every 6 (six) hours as needed. Asthmatic attack 18 g 3  . clindamycin (CLEOCIN) 150 MG capsule Take 600 mg by mouth as needed (dental work).     . Coenzyme Q10 (COQ10) 200 MG CAPS Take 200 mg by mouth daily.    . diphenhydrAMINE (BENADRYL) 25 MG tablet Take 25 mg by mouth at bedtime as needed for sleep. When not taking xanax.    . LevOCARNitine (CARNITINE PO) Take 3,000 mg by mouth daily.     Marland Kitchen ALPRAZolam (XANAX) 0.5 MG tablet TAKE 1 TABLET BY MOUTH ONCE DAILY AT BEDTIME AS NEEDED FOR ANXIETY 30 tablet 3  . atorvastatin (LIPITOR) 20 MG tablet Take 1 tablet (20 mg total) by mouth daily. 90 tablet 3  . diclofenac (VOLTAREN) 75 MG EC tablet Take 1 tablet (75 mg total) by mouth 2 (two) times daily. 180 tablet 3  . esomeprazole (NEXIUM) 40 MG capsule Take 1 capsule (40 mg total) by mouth daily. 90 capsule 3  . metFORMIN (GLUCOPHAGE-XR) 500 MG 24 hr tablet Take 1 tablet (500 mg total) by mouth 2 (two) times daily. 180 tablet 3  . misoprostol (CYTOTEC) 200 MCG tablet TAKE 1 TABLET BY MOUTH 2 TIMES DAILY. 120 tablet 5  . PARoxetine (PAXIL) 20 MG tablet Take 1 tablet (20 mg total) by mouth daily. (Patient taking differently: Take 40 mg by mouth daily. ) 90 tablet 3  . Calcium-Magnesium-Vitamin D (CALCIUM 500 PO) Take 500 mg by mouth 3 (three) times daily.    Marland Kitchen Diindolylmethane POWD 1 Package by Does not apply route daily. Reported on 09/10/2015    . docusate  sodium (COLACE) 100 MG capsule Take 100 mg by mouth 2 (two) times daily. Reported on 09/10/2015    . frovatriptan (FROVA) 2.5 MG tablet Take 1 tablet (2.5 mg total) by mouth as needed for migraine (max 3 tabs in 24 hours). If recurs, may repeat after 2 hours. Max of 3 tabs in 24 hours. (Patient not taking: Reported on 08/30/2018) 10 tablet 1  . methocarbamol (ROBAXIN) 500 MG tablet Take 1 tablet (500 mg total) by mouth every 8 (eight) hours as needed for muscle spasms. (Patient not taking: Reported on 08/30/2018) 50 tablet 0  . Multiple Vitamins-Minerals (ANTIOXIDANT PO) Take 1 capsule by mouth daily. Fruit    . OVER THE COUNTER MEDICATION Take 1 capsule by mouth daily. Procast package: Elite-100 omega 3 vitamin    . OVER THE COUNTER MEDICATION Take 1 capsule by mouth daily. Procast: Memory,brain and liver vitamin    . OVER THE COUNTER MEDICATION Take 1 capsule by mouth daily. Circulation and vein support vitamin    . PRESCRIPTION MEDICATION Reported on 09/10/2015    . Probiotic Product (PROBIOTIC DAILY PO) Take 1 capsule by mouth daily.    Marland Kitchen selenium sulfide (SELSUN) 2.5 % shampoo Apply 1 application topically daily as needed for irritation.    . simethicone (MYLICON) 80 MG chewable tablet Chew 80 mg by mouth every 6 (six) hours as needed for flatulence.    . terbinafine (LAMISIL) 250 MG  tablet Take 1 tablet (250 mg total) by mouth daily. (Patient not taking: Reported on 08/30/2018) 14 tablet 0   No facility-administered medications prior to visit.     Allergies  Allergen Reactions  . Penicillins Rash    ROS Review of Systems Review of Systems  Constitutional: Negative for activity change, appetite change, chills and fever.  HENT: Negative for congestion, nosebleeds, trouble swallowing and voice change.   Respiratory: Negative for cough, shortness of breath and wheezing.   Gastrointestinal: Negative for diarrhea, nausea and vomiting.  Genitourinary: Negative for difficulty urinating, dysuria,  flank pain and hematuria.  Musculoskeletal: Negative for back pain, joint swelling and neck pain.  Neurological: Negative for dizziness, speech difficulty, light-headedness and numbness.  See HPI. All other review of systems negative.     Objective:    Physical Exam  BP 122/76   Pulse 82   Temp 98.9 F (37.2 C) (Oral)   Resp 18   Ht 5' 2.21" (1.58 m)   Wt 159 lb 6.4 oz (72.3 kg)   LMP 06/22/2000   SpO2 97%   BMI 28.96 kg/m  Wt Readings from Last 3 Encounters:  08/30/18 159 lb 6.4 oz (72.3 kg)  03/09/18 154 lb 3.2 oz (69.9 kg)  08/30/17 155 lb (70.3 kg)   Physical Exam  Constitutional: Oriented to person, place, and time. Appears well-developed and well-nourished.  HENT:  Head: Normocephalic and atraumatic.  Eyes: Conjunctivae and EOM are normal.  Cardiovascular: Normal rate, regular rhythm, normal heart sounds and intact distal pulses.  No murmur heard. Pulmonary/Chest: Effort normal and breath sounds normal. No stridor. No respiratory distress. Has no wheezes.  Neurological: Is alert and oriented to person, place, and time.  Skin: Skin is warm. Capillary refill takes less than 2 seconds.  Psychiatric: Has a normal mood and affect. Behavior is normal. Judgment and thought content normal.     Health Maintenance Due  Topic Date Due  . OPHTHALMOLOGY EXAM  02/03/2018  . URINE MICROALBUMIN  03/02/2018    There are no preventive care reminders to display for this patient.  Lab Results  Component Value Date   TSH 2.470 03/02/2017   Lab Results  Component Value Date   WBC 3.6 08/30/2017   HGB 10.6 (L) 08/30/2017   HCT 33.6 (L) 08/30/2017   MCV 86 08/30/2017   PLT 193 08/30/2017   Lab Results  Component Value Date   NA 139 03/09/2018   K 4.3 03/09/2018   CHLORIDE 104 02/06/2014   CO2 24 03/09/2018   GLUCOSE 92 03/09/2018   BUN 7 (L) 03/09/2018   CREATININE 0.72 03/09/2018   BILITOT 0.3 03/09/2018   ALKPHOS 52 03/09/2018   AST 24 03/09/2018   ALT 23  03/09/2018   PROT 6.7 03/09/2018   ALBUMIN 4.5 03/09/2018   CALCIUM 9.5 03/09/2018   ANIONGAP 11 02/21/2014   Lab Results  Component Value Date   CHOL 189 08/30/2017   Lab Results  Component Value Date   HDL 88 08/30/2017   Lab Results  Component Value Date   LDLCALC 87 08/30/2017   Lab Results  Component Value Date   TRIG 68 08/30/2017   Lab Results  Component Value Date   CHOLHDL 2.1 08/30/2017    Lab Results  Component Value Date   HGBA1C 5.8 (H) 03/09/2018      Assessment & Plan:   Problem List Items Addressed This Visit      Endocrine   Diabetes mellitus without complication ([place]) -  Primary   Relevant Medications   atorvastatin (LIPITOR) 20 MG tablet   metFORMIN (GLUCOPHAGE-XR) 500 MG 24 hr tablet   Other Relevant Orders   CMP14+EGFR   Hemoglobin A1c   Lipid panel   CBC     Musculoskeletal and Integument   Osteopenia   Relevant Orders   TSH     Other   Pure hypercholesterolemia   -   Relevant Medications   atorvastatin (LIPITOR) 20 MG tablet   Other Relevant Orders   CMP14+EGFR   Lipid panel   TSH   Bilateral hand pain   Relevant Medications   diclofenac (VOLTAREN) 75 MG EC tablet   misoprostol (CYTOTEC) 200 MCG tablet    Other Visit Diagnoses    Mild anemia    -      Relevant Orders   CBC   Gastroesophageal reflux disease without esophagitis    -   Discussed goals    Relevant Medications   esomeprazole (NEXIUM) 40 MG capsule   misoprostol (CYTOTEC) 200 MCG tablet      Meds ordered this encounter  Medications  . PARoxetine (PAXIL) 20 MG tablet    Sig: Take 1 tablet (20 mg total) by mouth daily.    Dispense:  90 tablet    Refill:  3  . esomeprazole (NEXIUM) 40 MG capsule    Sig: Take 1 capsule (40 mg total) by mouth daily.    Dispense:  90 capsule    Refill:  3  . diclofenac (VOLTAREN) 75 MG EC tablet    Sig: Take 1 tablet (75 mg total) by mouth 2 (two) times daily.    Dispense:  180 tablet    Refill:  3  . atorvastatin  (LIPITOR) 20 MG tablet    Sig: Take 1 tablet (20 mg total) by mouth daily.    Dispense:  90 tablet    Refill:  3  . ALPRAZolam (XANAX) 0.5 MG tablet    Sig: TAKE 1 TABLET BY MOUTH ONCE DAILY AT BEDTIME AS NEEDED FOR ANXIETY    Dispense:  30 tablet    Refill:  2  . metFORMIN (GLUCOPHAGE-XR) 500 MG 24 hr tablet    Sig: Take 1 tablet (500 mg total) by mouth 2 (two) times daily.    Dispense:  180 tablet    Refill:  3  . misoprostol (CYTOTEC) 200 MCG tablet    Sig: TAKE 1 TABLET BY MOUTH 2 TIMES DAILY.    Dispense:  120 tablet    Refill:  5    Follow-up: No follow-ups on file.    Forrest Moron, MD

## 2018-08-30 NOTE — Patient Instructions (Signed)
° ° ° °  If you have lab work done today you will be contacted with your lab results within the next 2 weeks.  If you have not heard from us then please contact us. The fastest way to get your results is to register for My Chart. ° ° °IF you received an x-ray today, you will receive an invoice from [facility]. Please contact [facility] at [phone] with questions or concerns regarding your invoice.  ° °IF you received labwork today, you will receive an invoice from [facility]. Please contact [facility] at [phone] with questions or concerns regarding your invoice.  ° °Our billing staff will not be able to assist you with questions regarding bills from these companies. ° °You will be contacted with the lab results as soon as they are available. The fastest way to get your results is to activate your My Chart account. Instructions are located on the last page of this paperwork. If you have not heard from us regarding the results in 2 weeks, please contact this office. °  ° ° ° °

## 2018-08-31 LAB — CMP14+EGFR
ALT: 28 IU/L (ref 0–32)
AST: 28 IU/L (ref 0–40)
Albumin/Globulin Ratio: 2.3 — ABNORMAL HIGH (ref 1.2–2.2)
Albumin: 4.4 g/dL (ref 3.8–4.8)
Alkaline Phosphatase: 63 IU/L (ref 39–117)
BUN/Creatinine Ratio: 8 — ABNORMAL LOW (ref 12–28)
BUN: 6 mg/dL — ABNORMAL LOW (ref 8–27)
CO2: 25 mmol/L (ref 20–29)
Calcium: 9.6 mg/dL (ref 8.7–10.3)
Chloride: 98 mmol/L (ref 96–106)
Creatinine, Ser: 0.72 mg/dL (ref 0.57–1.00)
GFR calc Af Amer: 99 mL/min/{1.73_m2} (ref >59)
GFR calc non Af Amer: 86 mL/min/{1.73_m2} (ref >59)
Globulin, Total: 1.9 g/dL (ref 1.5–4.5)
Glucose: 88 mg/dL (ref 65–99)
Potassium: 5 mmol/L (ref 3.5–5.2)
Sodium: 136 mmol/L (ref 134–144)
Total Protein: 6.3 g/dL (ref 6.0–8.5)

## 2018-08-31 LAB — CBC
Hematocrit: 31.8 % — ABNORMAL LOW (ref 34.0–46.6)
Hemoglobin: 10.3 g/dL — ABNORMAL LOW (ref 11.1–15.9)
MCH: 27 pg (ref 26.6–33.0)
MCHC: 32.4 g/dL (ref 31.5–35.7)
MCV: 84 fL (ref 79–97)
Platelets: 231 10*3/uL (ref 150–450)
RBC: 3.81 x10E6/uL (ref 3.77–5.28)
RDW: 13.6 % (ref 11.7–15.4)
WBC: 3.6 10*3/uL (ref 3.4–10.8)

## 2018-08-31 LAB — LIPID PANEL
Chol/HDL Ratio: 2.5 ratio (ref 0.0–4.4)
Cholesterol, Total: 204 mg/dL — ABNORMAL HIGH (ref 100–199)
HDL: 81 mg/dL (ref >39)
LDL Calculated: 103 mg/dL — ABNORMAL HIGH (ref 0–99)
Triglycerides: 102 mg/dL (ref 0–149)
VLDL Cholesterol Cal: 20 mg/dL (ref 5–40)

## 2018-08-31 LAB — HEMOGLOBIN A1C
Est. average glucose Bld gHb Est-mCnc: 123 mg/dL
Hgb A1c MFr Bld: 5.9 % — ABNORMAL HIGH (ref 4.8–5.6)

## 2018-08-31 LAB — TSH: TSH: 2.09 u[IU]/mL (ref 0.450–4.500)

## 2018-09-07 ENCOUNTER — Ambulatory Visit: Payer: Medicare Other | Admitting: Family Medicine

## 2018-11-25 ENCOUNTER — Telehealth (INDEPENDENT_AMBULATORY_CARE_PROVIDER_SITE_OTHER): Payer: Medicare Other | Admitting: Family Medicine

## 2018-11-25 VITALS — BP 117/65 | HR 72 | Temp 97.8°F | Ht 63.0 in | Wt 156.0 lb

## 2018-11-25 DIAGNOSIS — F329 Major depressive disorder, single episode, unspecified: Secondary | ICD-10-CM | POA: Diagnosis not present

## 2018-11-25 DIAGNOSIS — F419 Anxiety disorder, unspecified: Secondary | ICD-10-CM

## 2018-11-25 DIAGNOSIS — F32A Depression, unspecified: Secondary | ICD-10-CM

## 2018-11-25 DIAGNOSIS — Z5189 Encounter for other specified aftercare: Secondary | ICD-10-CM

## 2018-11-25 MED ORDER — ALPRAZOLAM 0.5 MG PO TABS: ORAL_TABLET | ORAL | 2 refills | Status: DC

## 2018-11-25 MED ORDER — PAROXETINE HCL 30 MG PO TABS: 30.0000 mg | ORAL_TABLET | Freq: Every day | ORAL | 3 refills | Status: DC

## 2018-11-25 NOTE — Progress Notes (Signed)
Telemedicine Encounter- SOAP NOTE Established Patient  This telephone encounter was conducted with the patient's (or proxy's) verbal consent via audio telecommunications: yes/no: Yes Patient was instructed to have this encounter in a suitably private space; and to only have persons present to whom they give permission to participate. In addition, patient identity was confirmed by use of name plus two identifiers (DOB and address).  I discussed the limitations, risks, security and privacy concerns of performing an evaluation and management service by telephone and the availability of in person appointments. I also discussed with the patient that there may be a patient responsible charge related to this service. The patient expressed understanding and agreed to proceed.  I spent a total of TIME; 0 MIN TO 60 MIN: 15 minutes talking with the patient or their proxy.  CC: anxiety  Subjective   Alisha Matthews is a 68 y.o. established patient. Telephone visit today for  HPI  She reports that since Alisha Matthews and a death in the family her mood has been low  She states that she is talking to friends and keeping connected but maintaining social distancing  Depression screen [facility] 2/9 11/25/2018 08/30/2018 03/09/2018 08/30/2017 03/02/2017  Decreased Interest 0 0 0 0 0  Down, Depressed, Hopeless 0 0 0 0 0  PHQ - 2 Score 0 0 0 0 0  Altered sleeping 1 - - - -  Tired, decreased energy 0 - - - -  Change in appetite 0 - - - -  Feeling bad or failure about yourself  0 - - - -  Trouble concentrating 0 - - - -  Moving slowly or fidgety/restless 0 - - - -  Suicidal thoughts 0 - - - -  PHQ-9 Score 1 - - - -  Difficult doing work/chores Not difficult at all - - - -   No flowsheet data found.   She reports that she also has PTSD. She states that her mind races when she goes down to go to sleep. She reports that she does some deep breathing to help with sleep. She takes her Paxil 20mg  but feels like it has  plateaued She is taking alprazolam 0.5mg  at night  She has been on Paxil for years and stable on her dose but now with all the grief and stress it does not seem     Patient Active Problem List   Diagnosis Date Noted  . Bilateral hand pain 02/01/2017  . Migraine 09/15/2016  . Insomnia 09/15/2016  . Osteopenia 06/06/2014  . Left knee DJD 02/20/2014  . Other pancytopenia ([place]) 05/01/2013  . Breast cancer of upper-inner quadrant of right female breast ([place]) 02/21/2013  . Diabetes mellitus without complication ([place]) 47/42/5956  . Anxiety and depression 08/08/2012  . Pure hypercholesterolemia 08/08/2012    Past Medical History:  Diagnosis Date  . Anemia, unspecified   . Anxiety   . Arthritis   . Asthma   . Barrett's esophagus   . Breast cancer ([place])    right breast invasive ductal carcinoma   . Breast fibrocystic disorder   . Bronchitis   . Chicken pox   . Constipation, chronic   . Depressive disorder, not elsewhere classified   . Diabetes mellitus    borderline  . Essential hypertension, benign   . Exercise induced bronchospasm   . Genital warts   . GERD (gastroesophageal reflux disease)   . Hyperlipidemia   . Infected postoperative breast seroma   . Insomnia, unspecified   .  Leukocytopenia, unspecified   . Measles   . Migraine, unspecified, without mention of intractable migraine without mention of status migrainosus   . Mumps   . Obesity, unspecified   . Osteoarthrosis, unspecified whether generalized or localized, ankle and foot    ankle/foot  . Personal history of colonic polyps   . Symptomatic menopausal or female climacteric states     Current Outpatient Medications  Medication Sig Dispense Refill  . albuterol (PROVENTIL HFA;VENTOLIN HFA) 108 (90 Base) MCG/ACT inhaler Inhale 2 puffs into the lungs every 6 (six) hours as needed. Asthmatic attack 18 g 3  . ALPRAZolam (XANAX) 0.5 MG tablet TAKE 1 TABLET BY MOUTH ONCE DAILY AT BEDTIME AS NEEDED FOR ANXIETY 30  tablet 2  . atorvastatin (LIPITOR) 20 MG tablet Take 1 tablet (20 mg total) by mouth daily. 90 tablet 3  . Calcium-Magnesium-Vitamin D (CALCIUM 500 PO) Take 500 mg by mouth 3 (three) times daily.    . diclofenac (VOLTAREN) 75 MG EC tablet Take 1 tablet (75 mg total) by mouth 2 (two) times daily. 180 tablet 3  . diphenhydrAMINE (BENADRYL) 25 MG tablet Take 25 mg by mouth at bedtime as needed for sleep. When not taking xanax.    . docusate sodium (COLACE) 100 MG capsule Take 100 mg by mouth 2 (two) times daily. Reported on 09/10/2015    . esomeprazole (NEXIUM) 40 MG capsule Take 1 capsule (40 mg total) by mouth daily. 90 capsule 3  . metFORMIN (GLUCOPHAGE-XR) 500 MG 24 hr tablet Take 1 tablet (500 mg total) by mouth 2 (two) times daily. 180 tablet 3  . methocarbamol (ROBAXIN) 500 MG tablet Take 1 tablet (500 mg total) by mouth every 8 (eight) hours as needed for muscle spasms. 50 tablet 0  . misoprostol (CYTOTEC) 200 MCG tablet TAKE 1 TABLET BY MOUTH 2 TIMES DAILY. 120 tablet 5  . Multiple Vitamins-Minerals (ANTIOXIDANT PO) Take 1 capsule by mouth daily. Fruit    . OVER THE COUNTER MEDICATION Take 1 capsule by mouth daily. Procast package: Elite-100 omega 3 vitamin    . OVER THE COUNTER MEDICATION Take 1 capsule by mouth daily. Procast: Memory,brain and liver vitamin    . OVER THE COUNTER MEDICATION Take 1 capsule by mouth daily. Circulation and vein support vitamin    . PARoxetine (PAXIL) 30 MG tablet Take 1 tablet (30 mg total) by mouth daily. 30 tablet 3  . PRESCRIPTION MEDICATION Reported on 09/10/2015    . Probiotic Product (PROBIOTIC DAILY PO) Take 1 capsule by mouth daily.    Marland Kitchen selenium sulfide (SELSUN) 2.5 % shampoo Apply 1 application topically daily as needed for irritation.    . simethicone (MYLICON) 80 MG chewable tablet Chew 80 mg by mouth every 6 (six) hours as needed for flatulence.    . clindamycin (CLEOCIN) 150 MG capsule Take 600 mg by mouth as needed (dental work).     . Coenzyme  Q10 (COQ10) 200 MG CAPS Take 200 mg by mouth daily.    Marland Kitchen Diindolylmethane POWD 1 Package by Does not apply route daily. Reported on 09/10/2015    . frovatriptan (FROVA) 2.5 MG tablet Take 1 tablet (2.5 mg total) by mouth as needed for migraine (max 3 tabs in 24 hours). If recurs, may repeat after 2 hours. Max of 3 tabs in 24 hours. (Patient not taking: Reported on 08/30/2018) 10 tablet 1  . LevOCARNitine (CARNITINE PO) Take 3,000 mg by mouth daily.     Marland Kitchen terbinafine (LAMISIL) 250 MG tablet  Take 1 tablet (250 mg total) by mouth daily. (Patient not taking: Reported on 08/30/2018) 14 tablet 0   No current facility-administered medications for this visit.     Allergies  Allergen Reactions  . Penicillins Rash    Social History   Socioeconomic History  . Marital status: Married    Spouse name: Shanon Brow  . Number of children: 0  . Years of education: college  . Highest education level: Not on file  Occupational History  . Occupation: retired    Fish farm manager: [place] [employer]    Comment: previous [employer] at [employer]  . Financial resource strain: Not on file  . Food insecurity:    Worry: Not on file    Inability: Not on file  . Transportation needs:    Medical: Not on file    Non-medical: Not on file  Tobacco Use  . Smoking status: Never Smoker  . Smokeless tobacco: Never Used  Substance and Sexual Activity  . Alcohol use: No  . Drug use: No  . Sexual activity: Never    Partners: Male    Birth control/protection: Post-menopausal    Comment: post menopausal  Lifestyle  . Physical activity:    Days per week: Not on file    Minutes per session: Not on file  . Stress: Not on file  Relationships  . Social connections:    Talks on phone: Not on file    Gets together: Not on file    Attends religious service: Not on file    Active member of club or organization: Not on file    Attends meetings of clubs or organizations: Not on file    Relationship status: Not on file  . Intimate  partner violence:    Fear of current or ex partner: Not on file    Emotionally abused: Not on file    Physically abused: Not on file    Forced sexual activity: Not on file  Other Topics Concern  . Not on file  Social History Narrative   Marital status:  Married x 19 years happily, second marriage. No domestic abuse; 5 cats; no children.      Lives: with husband, 5 cats.      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      Tobacco: none       Alcohol: none      Drugs: none      Exercise:  Stair stepper x 30 minutes five days per week.  Spin gym weights daily.  Gardening.      Caffeine: + Moderate caffeine use.          Seatbelt: 100%; no texting while driving.      Guns:  Unloaded gun.      Advanced Directives:  Living Will --- FULL CODE but no prolonged measures.        ADLs: independent with ADLs; no assistant devices; drives.    ROS Review of Systems  Constitutional: Negative for activity change, appetite change, chills and fever.  HENT: Negative for congestion, nosebleeds, trouble swallowing and voice change.   Respiratory: Negative for cough, shortness of breath and wheezing.   Gastrointestinal: Negative for diarrhea, nausea and vomiting.  Genitourinary: Negative for difficulty urinating, dysuria, flank pain and hematuria.  Musculoskeletal: Negative for back pain, joint swelling and neck pain.  Neurological: Negative for dizziness, speech difficulty, light-headedness and numbness.  See HPI. All other review of  systems negative.    Objective   Vitals as reported by the patient: Today's Vitals   11/25/18 1015  BP: 117/65  Pulse: 72  Temp: 97.8 F (36.6 C)  TempSrc: Oral  Weight: 156 lb (70.8 kg)  Height: 5\' 3"  (1.6 m)    Diagnoses and all orders for this visit:  Anxiety and depression Encounter for medication adjustment  Other orders -     ALPRAZolam (XANAX) 0.5 MG tablet; TAKE 1 TABLET BY MOUTH ONCE DAILY AT  BEDTIME AS NEEDED FOR ANXIETY -     PARoxetine (PAXIL) 30 MG tablet; Take 1 tablet (30 mg total) by mouth daily.  Discussed her plateau of paxil Increased dose Discussed risks and benefits Explained that paxil takes time to increase in system and show effectiveness Plan for 2 months follow up     I discussed the assessment and treatment plan with the patient. The patient was provided an opportunity to ask questions and all were answered. The patient agreed with the plan and demonstrated an understanding of the instructions.   The patient was advised to call back or seek an in-person evaluation if the symptoms worsen or if the condition fails to improve as anticipated.  I provided 15 minutes of non-face-to-face time during this encounter.  Forrest Moron, MD  Primary Care at [facility]

## 2018-11-25 NOTE — Progress Notes (Signed)
Chief Complaint: Med refill ( Alprazolam) pending   PHQ9- score 1 and GAD7 score 1  GAD 7 Questionnaire Over the last 2 weeks, how often have you been bothered by the following problems?   Not at all sure = 0   Several days = 1   Over half the days =2   Nearly every day =3  1.  Feeling nervous, anxious, or on edge - 0 2.  Not being able to stop or control worrying -0 3.  Worrying too much about different things -1 4.  Trouble relaxing -0 5.  Being so restless that it's hard to sit still - 0 6.  Becoming easily annoyed or irritable -0 7.  Feeling afraid as if something awful might happen - 0 Total Score (add your column scores) = 1          If you checked off any problems, how difficult have these made it for you to do your work, take care of things at home, or get along with other people?   Not difficult at all

## 2018-11-29 ENCOUNTER — Telehealth: Payer: Self-pay | Admitting: Family Medicine

## 2018-11-29 ENCOUNTER — Encounter: Payer: Self-pay | Admitting: Family Medicine

## 2018-11-29 NOTE — Telephone Encounter (Signed)
Copied from [place] [id number]. Topic: General - Other >> Nov 29, 2018  3:48 PM Pauline Good wrote: Reason for CRM: pt stated her misoprostol need to be prescribe for every 3 months because she takes it together with diclofenac. Because it's very expensive. Please call pt so she can explain why she need this medicine to be prescribe together.

## 2019-01-13 ENCOUNTER — Telehealth: Payer: Self-pay | Admitting: Family Medicine

## 2019-01-13 NOTE — Telephone Encounter (Signed)
Called pt 2x. LVMTCB and confirm to cancel appt. CRM states she wants to cancel new patient appt, but she has an office visit at PCP and a new patient appt at another location.

## 2019-01-18 DIAGNOSIS — R51 Headache: Secondary | ICD-10-CM | POA: Diagnosis not present

## 2019-01-18 DIAGNOSIS — K047 Periapical abscess without sinus: Secondary | ICD-10-CM | POA: Diagnosis not present

## 2019-01-18 DIAGNOSIS — K046 Periapical abscess with sinus: Secondary | ICD-10-CM | POA: Diagnosis not present

## 2019-01-31 ENCOUNTER — Other Ambulatory Visit: Payer: Self-pay

## 2019-01-31 ENCOUNTER — Ambulatory Visit: Payer: Medicare Other | Admitting: Family Medicine

## 2019-01-31 ENCOUNTER — Ambulatory Visit (INDEPENDENT_AMBULATORY_CARE_PROVIDER_SITE_OTHER): Payer: Medicare Other | Admitting: Family Medicine

## 2019-01-31 ENCOUNTER — Encounter: Payer: Self-pay | Admitting: Family Medicine

## 2019-01-31 VITALS — BP 98/68 | HR 85 | Temp 97.3°F | Ht 63.0 in | Wt 158.4 lb

## 2019-01-31 DIAGNOSIS — F329 Major depressive disorder, single episode, unspecified: Secondary | ICD-10-CM

## 2019-01-31 DIAGNOSIS — F4310 Post-traumatic stress disorder, unspecified: Secondary | ICD-10-CM | POA: Diagnosis not present

## 2019-01-31 DIAGNOSIS — F419 Anxiety disorder, unspecified: Secondary | ICD-10-CM

## 2019-01-31 MED ORDER — PAROXETINE HCL 40 MG PO TABS: 40.0000 mg | ORAL_TABLET | ORAL | 2 refills | Status: DC

## 2019-01-31 NOTE — Patient Instructions (Addendum)
Sleep is important to [place] all. Getting good sleep is imperative to adequate functioning during the day. Work with our counselors who are trained to help people obtain quality sleep. Call [phone] to schedule an appointment or if you are curious about insurance coverage/cost.  Our goal is 10 or less episodes of alprazolam per month.   Aim to do some physical exertion for 150 minutes per week. This is typically divided into 5 days per week, 30 minutes per day. The activity should be enough to get your heart rate up. Anything is better than nothing if you have time constraints.   Sleep Hygiene Tips:  Do not watch TV or look at screens within 1 hour of going to bed. If you do, make sure there is a blue light filter (nighttime mode) involved.  Try to go to bed around the same time every night. Wake up at the same time within 1 hour of regular time. Ex: If you wake up at 7 AM for work, do not sleep past 8 AM on days that you don't work.  Do not drink alcohol before bedtime.  Do not consume caffeine-containing beverages after noon or within 9 hours of intended bedtime.  Get regular exercise/physical activity in your life, but not within 2 hours of planned bedtime.  Do not take naps.   Do not eat within 2 hours of planned bedtime.  Melatonin, 3-5 mg 30-60 minutes before planned bedtime may be helpful.   The bed should be for sleep or sex only. If after 20-30 minutes you are unable to fall asleep, get up and do something relaxing. Do this until you feel ready to go to sleep again.   Coping skills Choose 5 that work for you:  Take a deep breath  Count to 20  Read a book  Do a puzzle  Meditate  Bake  Sing  Knit  Garden  Pray  Go outside  Call a friend  Listen to music  Take a walk  Color  Send a note  Take a bath  Watch a movie  Be alone in a quiet place  Pet an animal  Visit a friend  Journal  Exercise  Stretch   Let us know if you need  anything.

## 2019-01-31 NOTE — Progress Notes (Signed)
Chief Complaint  Patient presents with  . New Patient (Initial Visit)       New Patient Visit SUBJECTIVE: HPI: Alisha Matthews is an 68 y.o.female who is being seen for establishing care.  The patient was previously seen at a different [facility].  Has tried various Z drugs without relief. Has racing thoughts and hx of PTSD. She is taking Pasxil 30 mg/d, increased from 20 mg/d in early June and feels better. No AE's. Does not have ay AE's. Xanax prn, uses nightly most nights. Sometimes uses Benadryl. Not following w counselor or psychologist.   Allergies  Allergen Reactions  . Penicillins Rash    Past Medical History:  Diagnosis Date  . Anemia, unspecified   . Anxiety   . Arthritis   . Asthma   . Barrett's esophagus   . Breast cancer ([place])    right breast invasive ductal carcinoma   . Breast fibrocystic disorder   . Bronchitis   . Chicken pox   . Constipation, chronic   . Depressive disorder, not elsewhere classified   . Diabetes mellitus    borderline  . Essential hypertension, benign   . Exercise induced bronchospasm   . Genital warts   . GERD (gastroesophageal reflux disease)   . Hyperlipidemia   . Infected postoperative breast seroma   . Insomnia, unspecified   . Leukocytopenia, unspecified   . Measles   . Migraine, unspecified, without mention of intractable migraine without mention of status migrainosus   . Mumps   . Obesity, unspecified   . Osteoarthrosis, unspecified whether generalized or localized, ankle and foot    ankle/foot  . Personal history of colonic polyps   . Symptomatic menopausal or female climacteric states    Past Surgical History:  Procedure Laterality Date  . BREAST LUMPECTOMY WITH NEEDLE LOCALIZATION AND AXILLARY SENTINEL LYMPH NODE BX  06/08/2012   Procedure: BREAST LUMPECTOMY WITH NEEDLE LOCALIZATION AND AXILLARY SENTINEL LYMPH NODE BX;  Surgeon: Rolm Bookbinder, MD;  Location: [place];  Service: General;  Laterality: Right;   . BREAST SURGERY    . BUNIONECTOMY     3 surgeries on both feet  . CHOLECYSTECTOMY    . COLONOSCOPY    . DILATION AND CURETTAGE OF UTERUS  1978  . EYE SURGERY     Orbital right eye surgery  . FOOT SURGERY     Left-revision  . JOINT REPLACEMENT    . NASAL SEPTUM SURGERY    . RE-EXCISION OF BREAST CANCER,SUPERIOR MARGINS  06/27/2012   Procedure: RE-EXCISION OF BREAST CANCER,SUPERIOR MARGINS;  Surgeon: Rolm Bookbinder, MD;  Location: [place];  Service: General;  Laterality: Right;  . TOTAL HIP ARTHROPLASTY     left      Perthes disease  . TOTAL KNEE ARTHROPLASTY Left 02/20/2014   Procedure: TOTAL KNEE ARTHROPLASTY;  Surgeon: Hessie Dibble, MD;  Location: [place];  Service: Orthopedics;  Laterality: Left;   Family History  Adopted: Yes  Problem Relation Age of Onset  . Hypertension Mother   . Diabetes Mother   . Heart failure Mother   . Heart disease Mother   . Hyperlipidemia Mother   . Stroke Mother   . Mental illness Mother   . Cancer Maternal Grandmother 20       non hodgkins lymphoma  . Cancer Maternal Grandfather   . Deep vein thrombosis Brother    Allergies  Allergen Reactions  . Penicillins Rash    Current Outpatient Medications:  .  ALPRAZolam (XANAX) 0.5 MG tablet, TAKE 1 TABLET BY MOUTH ONCE DAILY AT BEDTIME AS NEEDED FOR ANXIETY, Disp: 30 tablet, Rfl: 2 .  atorvastatin (LIPITOR) 20 MG tablet, Take 1 tablet (20 mg total) by mouth daily., Disp: 90 tablet, Rfl: 3 .  Calcium-Magnesium-Vitamin D (CALCIUM 500 PO), Take 500 mg by mouth 3 (three) times daily., Disp: , Rfl:  .  clindamycin (CLEOCIN) 150 MG capsule, Take 600 mg by mouth as needed (dental work). , Disp: , Rfl:  .  Coenzyme Q10 (COQ10) 200 MG CAPS, Take 200 mg by mouth daily., Disp: , Rfl:  .  diclofenac (VOLTAREN) 75 MG EC tablet, Take 1 tablet (75 mg total) by mouth 2 (two) times daily., Disp: 180 tablet, Rfl: 3 .  Diindolylmethane POWD, 1 Package by Does not apply route daily. Reported  on 09/10/2015, Disp: , Rfl:  .  diphenhydrAMINE (BENADRYL) 25 MG tablet, Take 25 mg by mouth at bedtime as needed for sleep. When not taking xanax., Disp: , Rfl:  .  esomeprazole (NEXIUM) 40 MG capsule, Take 1 capsule (40 mg total) by mouth daily., Disp: 90 capsule, Rfl: 3 .  LevOCARNitine (CARNITINE PO), Take 3,000 mg by mouth daily. , Disp: , Rfl:  .  metFORMIN (GLUCOPHAGE-XR) 500 MG 24 hr tablet, Take 1 tablet (500 mg total) by mouth 2 (two) times daily., Disp: 180 tablet, Rfl: 3 .  misoprostol (CYTOTEC) 200 MCG tablet, TAKE 1 TABLET BY MOUTH 2 TIMES DAILY., Disp: 120 tablet, Rfl: 5 .  Multiple Vitamins-Minerals (ANTIOXIDANT PO), Take 1 capsule by mouth daily. Fruit, Disp: , Rfl:  .  OVER THE COUNTER MEDICATION, Take 1 capsule by mouth daily. Procast package: Elite-100 omega 3 vitamin, Disp: , Rfl:  .  OVER THE COUNTER MEDICATION, Take 1 capsule by mouth daily. Procast: Memory,brain and liver vitamin, Disp: , Rfl:  .  OVER THE COUNTER MEDICATION, Take 1 capsule by mouth daily. Circulation and vein support vitamin, Disp: , Rfl:  .  Probiotic Product (PROBIOTIC DAILY PO), Take 1 capsule by mouth daily., Disp: , Rfl:  .  frovatriptan (FROVA) 2.5 MG tablet, Take 1 tablet (2.5 mg total) by mouth as needed for migraine (max 3 tabs in 24 hours). If recurs, may repeat after 2 hours. Max of 3 tabs in 24 hours. (Patient not taking: Reported on 08/30/2018), Disp: 10 tablet, Rfl: 1 .  PARoxetine (PAXIL) 40 MG tablet, Take 1 tablet (40 mg total) by mouth every morning., Disp: 30 tablet, Rfl: 2  Patient's last menstrual period was 06/22/2000.  ROS Psych +anxiety      OBJECTIVE: BP 98/68 (BP Location: Left Arm, Patient Position: Sitting)   Pulse 85   Temp (!) 97.3 F (36.3 C) (Axillary)   Ht 5\' 3"  (1.6 m)   Wt 158 lb 6 oz (71.8 kg)   LMP 06/22/2000   SpO2 98%   BMI 28.05 kg/m   Constitutional: -  VS reviewed -  Well developed, well nourished, appears stated age -  No apparent distress   Psychiatric: -  Oriented to person, place, and time -  Memory intact -  Affect and mood normal -  Fluent conversation, good eye contact -  Judgment and insight age appropriate  Eye: -  Conjunctivae clear, no discharge -  Pupils symmetric, round, reactive to light  Cardiovascular: -  RRR -  No LE edema  Respiratory: -  Normal respiratory effort, no accessory muscle use, no retraction -  Breath sounds equal, no wheezes, no ronchi, no  crackles  Musculoskeletal: -  No clubbing, no cyanosis -  Gait normal  Skin: -  No significant lesion on inspection -  Warm and dry to palpation   ASSESSMENT/PLAN: PTSD (post-traumatic stress disorder) - Plan: Increase dose of Paxil from 30 mg daily to 40 mg daily.  Continue alprazolam as needed.  My goal for her is 10 or less uses per month.  Behavioral health information provided.  Counseled on exercise.  Would consider buspirone if no improvement.  Anxiety and depression - Plan: As above.  Patient instructed to sign release of records form from her previous PCP. Patient should return in 2 mo. The patient voiced understanding and agreement to the plan.  Greater than 30 minutes were spent face to face with the patient with greater than 50% of this time spent counseling on treatment, CBT, benzo alternatives, goals of care for her with me, follow up.     [place], DO 01/31/19  2:31 PM

## 2019-03-01 DIAGNOSIS — Z23 Encounter for immunization: Secondary | ICD-10-CM | POA: Diagnosis not present

## 2019-04-01 DIAGNOSIS — L2089 Other atopic dermatitis: Secondary | ICD-10-CM | POA: Diagnosis not present

## 2019-04-03 ENCOUNTER — Other Ambulatory Visit: Payer: Self-pay

## 2019-04-03 ENCOUNTER — Ambulatory Visit (INDEPENDENT_AMBULATORY_CARE_PROVIDER_SITE_OTHER): Payer: Medicare Other | Admitting: Family Medicine

## 2019-04-03 ENCOUNTER — Encounter: Payer: Self-pay | Admitting: Family Medicine

## 2019-04-03 VITALS — BP 112/76 | HR 98 | Temp 96.9°F | Ht 63.0 in | Wt 156.0 lb

## 2019-04-03 DIAGNOSIS — F4310 Post-traumatic stress disorder, unspecified: Secondary | ICD-10-CM | POA: Diagnosis not present

## 2019-04-03 MED ORDER — TRAZODONE HCL 50 MG PO TABS: 25.0000 mg | ORAL_TABLET | Freq: Every evening | ORAL | 3 refills | Status: DC | PRN

## 2019-04-03 MED ORDER — PAROXETINE HCL 20 MG PO TABS: 20.0000 mg | ORAL_TABLET | Freq: Every day | ORAL | 2 refills | Status: DC

## 2019-04-03 MED ORDER — ALPRAZOLAM 0.25 MG PO TABS: 0.2500 mg | ORAL_TABLET | Freq: Every evening | ORAL | 5 refills | Status: DC | PRN

## 2019-04-03 NOTE — Progress Notes (Signed)
Chief Complaint  Patient presents with  . Follow-up    Subjective Alisha Matthews presents for f/u PTSD. She is currently being treated with Paxil 40 mg/d, recently increased from 30 mg/d. Also on Xanax 0.5 mg daily prn.  She feels that the 0.25 mg dosage worked just as well. Feels she has been more irritable. Definitely having more sexual side effects and increased appetite.  No thoughts of harming self or others. No self-medication with alcohol, prescription drugs or illicit drugs. Has failed numerous Z drugs Pt is not following with a counselor/psychologist. Has done CBT in the past.   ROS Psych: No homicidal or suicidal thoughts  Past Medical History:  Diagnosis Date  . Anemia, unspecified   . Anxiety   . Arthritis   . Asthma   . Barrett's esophagus   . Breast cancer ([place])    right breast invasive ductal carcinoma   . Breast fibrocystic disorder   . Bronchitis   . Chicken pox   . Constipation, chronic   . Depressive disorder, not elsewhere classified   . Diabetes mellitus    borderline  . Essential hypertension, benign   . Exercise induced bronchospasm   . Genital warts   . GERD (gastroesophageal reflux disease)   . Hyperlipidemia   . Infected postoperative breast seroma   . Insomnia, unspecified   . Leukocytopenia, unspecified   . Measles   . Migraine, unspecified, without mention of intractable migraine without mention of status migrainosus   . Mumps   . Obesity, unspecified   . Osteoarthrosis, unspecified whether generalized or localized, ankle and foot    ankle/foot  . Personal history of colonic polyps   . Symptomatic menopausal or female climacteric states      Exam BP 112/76 (BP Location: Left Arm, Patient Position: Sitting, Cuff Size: Large)   Pulse 98   Temp (!) 96.9 F (36.1 C) (Temporal)   Ht 5\' 3"  (1.6 m)   Wt 156 lb (70.8 kg)   LMP 06/22/2000   SpO2 96%   BMI 27.63 kg/m  General:  well developed, well nourished, in no apparent  distress Lungs: No accessory muscle use Psych: well oriented with normal range of affect and age-appropriate judgement/insight, alert and oriented x4.  Assessment and Plan  PTSD (post-traumatic stress disorder) - Plan: PARoxetine (PAXIL) 20 MG tablet, ALPRAZolam (XANAX) 0.25 MG tablet, traZODone (DESYREL) 50 MG tablet  Go back to 20 mg Paxil and 0.25 mg alprazolam as needed.  Trial trazodone 25-50 mg nightly. F/u in 2 mo to reck. The patient voiced understanding and agreement to the plan.  [place], DO 04/03/19 10:42 AM

## 2019-04-03 NOTE — Patient Instructions (Addendum)
Give the trazodone a shot. If it doesn't work, let me know.  Stay active.  Aim to do some physical exertion for 150 minutes per week. This is typically divided into 5 days per week, 30 minutes per day. The activity should be enough to get your heart rate up. Anything is better than nothing if you have time constraints. Please consider weight resistance exercise.   Let us know if you need anything.

## 2019-05-01 DIAGNOSIS — Z96652 Presence of left artificial knee joint: Secondary | ICD-10-CM | POA: Diagnosis not present

## 2019-05-01 DIAGNOSIS — Z96642 Presence of left artificial hip joint: Secondary | ICD-10-CM | POA: Diagnosis not present

## 2019-05-01 DIAGNOSIS — M25511 Pain in right shoulder: Secondary | ICD-10-CM | POA: Diagnosis not present

## 2019-06-05 ENCOUNTER — Other Ambulatory Visit: Payer: Self-pay

## 2019-06-06 ENCOUNTER — Ambulatory Visit (INDEPENDENT_AMBULATORY_CARE_PROVIDER_SITE_OTHER): Payer: Medicare Other | Admitting: Family Medicine

## 2019-06-06 ENCOUNTER — Encounter: Payer: Self-pay | Admitting: Family Medicine

## 2019-06-06 VITALS — BP 108/76 | HR 87 | Temp 96.8°F | Ht 63.0 in | Wt 155.0 lb

## 2019-06-06 DIAGNOSIS — E7800 Pure hypercholesterolemia, unspecified: Secondary | ICD-10-CM

## 2019-06-06 DIAGNOSIS — M25511 Pain in right shoulder: Secondary | ICD-10-CM | POA: Diagnosis not present

## 2019-06-06 DIAGNOSIS — F4310 Post-traumatic stress disorder, unspecified: Secondary | ICD-10-CM | POA: Diagnosis not present

## 2019-06-06 DIAGNOSIS — E119 Type 2 diabetes mellitus without complications: Secondary | ICD-10-CM

## 2019-06-06 DIAGNOSIS — M79641 Pain in right hand: Secondary | ICD-10-CM

## 2019-06-06 DIAGNOSIS — K219 Gastro-esophageal reflux disease without esophagitis: Secondary | ICD-10-CM

## 2019-06-06 DIAGNOSIS — Z79899 Other long term (current) drug therapy: Secondary | ICD-10-CM

## 2019-06-06 DIAGNOSIS — M79642 Pain in left hand: Secondary | ICD-10-CM

## 2019-06-06 DIAGNOSIS — M25512 Pain in left shoulder: Secondary | ICD-10-CM

## 2019-06-06 LAB — CBC
HCT: 38.1 % (ref 36.0–46.0)
Hemoglobin: 12.7 g/dL (ref 12.0–15.0)
MCHC: 33.4 g/dL (ref 30.0–36.0)
MCV: 90.6 fl (ref 78.0–100.0)
Platelets: 224 10*3/uL (ref 150.0–400.0)
RBC: 4.21 Mil/uL (ref 3.87–5.11)
RDW: 13 % (ref 11.5–15.5)
WBC: 4.5 10*3/uL (ref 4.0–10.5)

## 2019-06-06 LAB — COMPREHENSIVE METABOLIC PANEL
ALT: 29 U/L (ref 0–35)
AST: 32 U/L (ref 0–37)
Albumin: 4.5 g/dL (ref 3.5–5.2)
Alkaline Phosphatase: 54 U/L (ref 39–117)
BUN: 7 mg/dL (ref 6–23)
CO2: 30 mEq/L (ref 19–32)
Calcium: 9.3 mg/dL (ref 8.4–10.5)
Chloride: 97 mEq/L (ref 96–112)
Creatinine, Ser: 0.68 mg/dL (ref 0.40–1.20)
GFR: 85.41 mL/min (ref >60.00)
Glucose, Bld: 91 mg/dL (ref 70–99)
Potassium: 4 mEq/L (ref 3.5–5.1)
Sodium: 134 mEq/L — ABNORMAL LOW (ref 135–145)
Total Bilirubin: 0.4 mg/dL (ref 0.2–1.2)
Total Protein: 6.7 g/dL (ref 6.0–8.3)

## 2019-06-06 LAB — MICROALBUMIN / CREATININE URINE RATIO
Creatinine,U: 38.9 mg/dL
Microalb Creat Ratio: 1.8 mg/g (ref 0.0–30.0)
Microalb, Ur: <0.7 mg/dL (ref 0.0–1.9)

## 2019-06-06 LAB — LIPID PANEL
Cholesterol: 205 mg/dL — ABNORMAL HIGH (ref 0–200)
HDL: 81.1 mg/dL (ref >39.00)
LDL Cholesterol: 101 mg/dL — ABNORMAL HIGH (ref 0–99)
NonHDL: 124.39
Total CHOL/HDL Ratio: 3
Triglycerides: 115 mg/dL (ref 0.0–149.0)
VLDL: 23 mg/dL (ref 0.0–40.0)

## 2019-06-06 LAB — HEMOGLOBIN A1C: Hgb A1c MFr Bld: 5.8 % (ref 4.6–6.5)

## 2019-06-06 MED ORDER — ATORVASTATIN CALCIUM 20 MG PO TABS: 20.0000 mg | ORAL_TABLET | Freq: Every day | ORAL | 3 refills | Status: DC

## 2019-06-06 MED ORDER — PAROXETINE HCL 20 MG PO TABS: 20.0000 mg | ORAL_TABLET | Freq: Every day | ORAL | 2 refills | Status: DC

## 2019-06-06 MED ORDER — ALPRAZOLAM 0.25 MG PO TABS: 0.2500 mg | ORAL_TABLET | Freq: Every evening | ORAL | 5 refills | Status: DC | PRN

## 2019-06-06 MED ORDER — MISOPROSTOL 200 MCG PO TABS: ORAL_TABLET | ORAL | 2 refills | Status: DC

## 2019-06-06 MED ORDER — DICLOFENAC SODIUM 75 MG PO TBEC: 75.0000 mg | DELAYED_RELEASE_TABLET | Freq: Two times a day (BID) | ORAL | 3 refills | Status: DC

## 2019-06-06 MED ORDER — METFORMIN HCL ER 500 MG PO TB24: 500.0000 mg | ORAL_TABLET | Freq: Two times a day (BID) | ORAL | 3 refills | Status: DC

## 2019-06-06 MED ORDER — ESOMEPRAZOLE MAGNESIUM 40 MG PO CPDR: 40.0000 mg | DELAYED_RELEASE_CAPSULE | Freq: Every day | ORAL | 3 refills | Status: DC

## 2019-06-06 NOTE — Progress Notes (Addendum)
Chief Complaint  Patient presents with  . Follow-up    2 month    Subjective: Patient is a 68 y.o. female here for f/u.  Patient following up on sleep issues/PTSD.  She was placed on trazodone as a means to replace the Xanax.  It made her groggy so she stopped taking it.  She did not try half of a tab though.  She has been using Xanax 0.25 mg nightly to help her sleep.  No side effects with it.   Patient has been having fatigue.  She would like her labs checked.  She does have a history of diabetes.  It is well controlled and she takes Metformin XR daily.  No side effects of the medication.  Diet is healthy overall she tries to stay active.  She is also on Lipitor.   ROS: MSK: + shoulder pain Psych: +insomnia  Past Medical History:  Diagnosis Date  . Anemia, unspecified   . Anxiety   . Arthritis   . Asthma   . Barrett's esophagus   . Breast cancer ([place])    right breast invasive ductal carcinoma   . Breast fibrocystic disorder   . Bronchitis   . Chicken pox   . Constipation, chronic   . Depressive disorder, not elsewhere classified   . Diabetes mellitus    borderline  . Essential hypertension, benign   . Exercise induced bronchospasm   . Genital warts   . GERD (gastroesophageal reflux disease)   . Hyperlipidemia   . Infected postoperative breast seroma   . Insomnia, unspecified   . Leukocytopenia, unspecified   . Measles   . Migraine, unspecified, without mention of intractable migraine without mention of status migrainosus   . Mumps   . Obesity, unspecified   . Osteoarthrosis, unspecified whether generalized or localized, ankle and foot    ankle/foot  . Personal history of colonic polyps   . Symptomatic menopausal or female climacteric states     Objective: BP 108/76 (BP Location: Left Arm, Patient Position: Sitting, Cuff Size: Large)   Pulse 87   Temp (!) 96.8 F (36 C) (Temporal)   Ht 5' 3"  (1.6 m)   Wt 155 lb (70.3 kg)   LMP 06/22/2000   SpO2 96%   BMI  27.46 kg/m  General: Awake, appears stated age HEENT: MMM, EOMi Heart: RRR Lungs: CTAB, no rales, wheezes or rhonchi. No accessory muscle use MSK: Decent active/passive ROM of both shoulders, no ttp Psych: Age appropriate judgment and insight, normal affect and mood  Assessment and Plan: PTSD (post-traumatic stress disorder) - Plan: PARoxetine (PAXIL) 20 MG tablet, ALPRAZolam (XANAX) 0.25 MG tablet  Diabetes mellitus without complication (HCC) - Plan: metFORMIN (GLUCOPHAGE-XR) 500 MG 24 hr tablet, atorvastatin (LIPITOR) 20 MG tablet, CBC, Comp Met (CMET), Urine Microalbumin w/creat. ratio, Lipid Profile, HgB A1c  Bilateral shoulder pain, unspecified chronicity - Plan: diclofenac (VOLTAREN) 75 MG EC tablet  Gastroesophageal reflux disease without esophagitis - Plan: misoprostol (CYTOTEC) 200 MCG tablet, esomeprazole (NEXIUM) 40 MG capsule  Pure hypercholesterolemia - Plan: atorvastatin (LIPITOR) 20 MG tablet  Long-term use of high-risk medication - Plan: Pain Mgmt, Profile 8 w/Conf, U  1- Cont Paxil and Xanax, update CSC and UDS. Try 1/2 tab of the trazodone.  2- Ck A1c and labs. Would like her to let me know when her eye exam was, she will let me know.  3- for shoulder pain, stretches/exercises given. Heat, ice, needs to f/u w her orthopod.  F/u in 3 mo  or prn.  The patient voiced understanding and agreement to the plan.  [place], DO 06/07/19  11:43 AM

## 2019-06-06 NOTE — Patient Instructions (Addendum)
Give [place] 2-3 business days to get the results of your labs back.   Keep the diet clean and stay active.  Try to cut the trazodone in half and see how it works.   Let me know when you had your eye exam.   Let us know if you need anything.  EXERCISES  RANGE OF MOTION (ROM) AND STRETCHING EXERCISES These exercises may help you when beginning to rehabilitate your injury. While completing these exercises, remember:   Restoring tissue flexibility helps normal motion to return to the joints. This allows healthier, less painful movement and activity.  An effective stretch should be held for at least 30 seconds.  A stretch should never be painful. You should only feel a gentle lengthening or release in the stretched tissue.  ROM - Pendulum  Bend at the waist so that your right / left arm falls away from your body. Support yourself with your opposite hand on a solid surface, such as a table or a countertop.  Your right / left arm should be perpendicular to the ground. If it is not perpendicular, you need to lean over farther. Relax the muscles in your right / left arm and shoulder as much as possible.  Gently sway your hips and trunk so they move your right / left arm without any use of your right / left shoulder muscles.  Progress your movements so that your right / left arm moves side to side, then forward and backward, and finally, both clockwise and counterclockwise.  Complete 10-15 repetitions in each direction. Many people use this exercise to relieve discomfort in their shoulder as well as to gain range of motion. Repeat 2 times. Complete this exercise 3 times per week.  STRETCH - Flexion, Standing  Stand with good posture. With an underhand grip on your right / left hand and an overhand grip on the opposite hand, grasp a broomstick or cane so that your hands are a little more than shoulder-width apart.  Keeping your right / left elbow straight and shoulder muscles relaxed, push the  stick with your opposite hand to raise your right / left arm in front of your body and then overhead. Raise your arm until you feel a stretch in your right / left shoulder, but before you have increased shoulder pain.  Try to avoid shrugging your right / left shoulder as your arm rises by keeping your shoulder blade tucked down and toward your mid-back spine. Hold 30 seconds.  Slowly return to the starting position. Repeat 2 times. Complete this exercise 3 times per week.  STRETCH - Internal Rotation  Place your right / left hand behind your back, palm-up.  Throw a towel or belt over your opposite shoulder. Grasp the towel/belt with your right / left hand.  While keeping an upright posture, gently pull up on the towel/belt until you feel a stretch in the front of your right / left shoulder.  Avoid shrugging your right / left shoulder as your arm rises by keeping your shoulder blade tucked down and toward your mid-back spine.  Hold 30. Release the stretch by lowering your opposite hand. Repeat 2 times. Complete this exercise 3 times per week.  STRETCH - External Rotation and Abduction  Stagger your stance through a doorframe. It does not matter which foot is forward.  As instructed by your physician, physical therapist or athletic trainer, place your hands: ? And forearms above your head and on the door frame. ? And forearms at head-height  and on the door frame. ? At elbow-height and on the door frame.  Keeping your head and chest upright and your stomach muscles tight to prevent over-extending your low-back, slowly shift your weight onto your front foot until you feel a stretch across your chest and/or in the front of your shoulders.  Hold 30 seconds. Shift your weight to your back foot to release the stretch. Repeat 2 times. Complete this stretch 3 times per week.   STRENGTHENING EXERCISES  These exercises may help you when beginning to rehabilitate your injury. They may resolve  your symptoms with or without further involvement from your physician, physical therapist or athletic trainer. While completing these exercises, remember:   Muscles can gain both the endurance and the strength needed for everyday activities through controlled exercises.  Complete these exercises as instructed by your physician, physical therapist or athletic trainer. Progress the resistance and repetitions only as guided.  You may experience muscle soreness or fatigue, but the pain or discomfort you are trying to eliminate should never worsen during these exercises. If this pain does worsen, stop and make certain you are following the directions exactly. If the pain is still present after adjustments, discontinue the exercise until you can discuss the trouble with your clinician.  If advised by your physician, during your recovery, avoid activity or exercises which involve actions that place your right / left hand or elbow above your head or behind your back or head. These positions stress the tissues which are trying to heal.  STRENGTH - Scapular Depression and Adduction  With good posture, sit on a firm chair. Supported your arms in front of you with pillows, arm rests or a table top. Have your elbows in line with the sides of your body.  Gently draw your shoulder blades down and toward your mid-back spine. Gradually increase the tension without tensing the muscles along the top of your shoulders and the back of your neck.  Hold for 3 seconds. Slowly release the tension and relax your muscles completely before completing the next repetition.  After you have practiced this exercise, remove the arm support and complete it in standing as well as sitting. Repeat 2 times. Complete this exercise 3 times per week.   STRENGTH - External Rotators  Secure a rubber exercise band/tubing to a fixed object so that it is at the same height as your right / left elbow when you are standing or sitting on a  firm surface.  Stand or sit so that the secured exercise band/tubing is at your side that is not injured.  Bend your elbow 90 degrees. Place a folded towel or small pillow under your right / left arm so that your elbow is a few inches away from your side.  Keeping the tension on the exercise band/tubing, pull it away from your body, as if pivoting on your elbow. Be sure to keep your body steady so that the movement is only coming from your shoulder rotating.  Hold 3 seconds. Release the tension in a controlled manner as you return to the starting position. Repeat 2 times. Complete this exercise 3 times per week.   STRENGTH - Supraspinatus  Stand or sit with good posture. Grasp a 2-3 lb weight or an exercise band/tubing so that your hand is "thumbs-up," like when you shake hands.  Slowly lift your right / left hand from your thigh into the air, traveling about 30 degrees from straight out at your side. Lift your hand to  shoulder height or as far as you can without increasing any shoulder pain. Initially, many people do not lift their hands above shoulder height.  Avoid shrugging your right / left shoulder as your arm rises by keeping your shoulder blade tucked down and toward your mid-back spine.  Hold for 3 seconds. Control the descent of your hand as you slowly return to your starting position. Repeat 2 times. Complete this exercise 3 times per week.   STRENGTH - Shoulder Extensors  Secure a rubber exercise band/tubing so that it is at the height of your shoulders when you are either standing or sitting on a firm arm-less chair.  With a thumbs-up grip, grasp an end of the band/tubing in each hand. Straighten your elbows and lift your hands straight in front of you at shoulder height. Step back away from the secured end of band/tubing until it becomes tense.  Squeezing your shoulder blades together, pull your hands down to the sides of your thighs. Do not allow your hands to go behind  you.  Hold for 3 seconds. Slowly ease the tension on the band/tubing as you reverse the directions and return to the starting position. Repeat 2 times. Complete this exercise 3 times per week.   STRENGTH - Scapular Retractors  Secure a rubber exercise band/tubing so that it is at the height of your shoulders when you are either standing or sitting on a firm arm-less chair.  With a palm-down grip, grasp an end of the band/tubing in each hand. Straighten your elbows and lift your hands straight in front of you at shoulder height. Step back away from the secured end of band/tubing until it becomes tense.  Squeezing your shoulder blades together, draw your elbows back as you bend them. Keep your upper arm lifted away from your body throughout the exercise.  Hold 3 seconds. Slowly ease the tension on the band/tubing as you reverse the directions and return to the starting position. Repeat 2 times. Complete this exercise 3 times per week.  STRENGTH - Scapular Depressors  Find a sturdy chair without wheels, such as a from a dining room table.  Keeping your feet on the floor, lift your bottom from the seat and lock your elbows.  Keeping your elbows straight, allow gravity to pull your body weight down. Your shoulders will rise toward your ears.  Raise your body against gravity by drawing your shoulder blades down your back, shortening the distance between your shoulders and ears. Although your feet should always maintain contact with the floor, your feet should progressively support less body weight as you get stronger.  Hold 3 seconds. In a controlled and slow manner, lower your body weight to begin the next repetition. Repeat 2 times. Complete this exercise 3 times per week.   This information is not intended to replace advice given to you by your health care provider. Make sure you discuss any questions you have with your health care provider.  Document Released: 04/22/2005 Document  Revised: 06/29/2014 Document Reviewed: 09/20/2008 Elsevier Interactive Patient Education [employer].

## 2019-06-07 ENCOUNTER — Telehealth: Payer: Self-pay

## 2019-06-07 DIAGNOSIS — Z79899 Other long term (current) drug therapy: Secondary | ICD-10-CM | POA: Insufficient documentation

## 2019-06-07 DIAGNOSIS — M25511 Pain in right shoulder: Secondary | ICD-10-CM | POA: Insufficient documentation

## 2019-06-07 DIAGNOSIS — M25512 Pain in left shoulder: Secondary | ICD-10-CM | POA: Insufficient documentation

## 2019-06-07 DIAGNOSIS — K219 Gastro-esophageal reflux disease without esophagitis: Secondary | ICD-10-CM | POA: Insufficient documentation

## 2019-06-07 NOTE — Telephone Encounter (Signed)
Clarified with the patient and PCP diagnosis for the visit was for Bilateral shoulder pain, not Bilateral hand pain as was on her AVS but was due to PCP refilling her Voltaren Gel.

## 2019-06-07 NOTE — Telephone Encounter (Signed)
My coding and diagnoses are accurate unless there was a large misunderstanding. I am not sure what I am to do with this.

## 2019-06-07 NOTE — Telephone Encounter (Signed)
Copied from [place] [id number]. Topic: Complaint - Billing/Coding >> Jun 06, 2019  2:05 PM Celene Kras wrote: DOS: 06/06/2019 Details of complaint: Pt states the reason she was seen was not put in correctly How would the patient like to see this issue resolved? Pt is requesting to have the reason she was seen corrected. Please advise.    Will automatically be routed to [place] pool.

## 2019-06-09 LAB — PAIN MGMT, PROFILE 8 W/CONF, U
6 Acetylmorphine: NEGATIVE ng/mL
Alcohol Metabolites: NEGATIVE ng/mL (ref <500)
Alphahydroxyalprazolam: 33 ng/mL
Alphahydroxymidazolam: NEGATIVE ng/mL
Alphahydroxytriazolam: NEGATIVE ng/mL
Aminoclonazepam: NEGATIVE ng/mL
Amphetamines: NEGATIVE ng/mL
Benzodiazepines: POSITIVE ng/mL
Buprenorphine, Urine: NEGATIVE ng/mL
Cocaine Metabolite: NEGATIVE ng/mL
Creatinine: 41.6 mg/dL
Hydroxyethylflurazepam: NEGATIVE ng/mL
Lorazepam: NEGATIVE ng/mL
MDMA: NEGATIVE ng/mL
Marijuana Metabolite: NEGATIVE ng/mL
Nordiazepam: NEGATIVE ng/mL
Opiates: NEGATIVE ng/mL
Oxazepam: NEGATIVE ng/mL
Oxidant: NEGATIVE ug/mL
Oxycodone: NEGATIVE ng/mL
Temazepam: NEGATIVE ng/mL
pH: 6.5 (ref 4.5–9.0)

## 2019-06-13 ENCOUNTER — Encounter: Payer: Self-pay | Admitting: Family Medicine

## 2019-06-25 ENCOUNTER — Encounter: Payer: Self-pay | Admitting: Family Medicine

## 2019-11-22 NOTE — Progress Notes (Signed)
Subjective:   Alisha Matthews is a 69 y.o. female who presents for Medicare Annual (Subsequent) preventive examination.  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  Review of Systems: Home Safety/Smoke Alarms: Feels safe in home. Smoke alarms in place.  Lives w/ husband. 1 story.   Female:   Mammo-  06/13/19     Dexa scan-  Declines. States she would not take any treatment.      CCS-08/25/11. Recall 10 yrs.    Objective:     Vitals: BP 112/76 (BP Location: Left Arm, Patient Position: Sitting, Cuff Size: Normal) Comment: All vitals done by R.Ewing CMA   Pulse 86    Temp (!) 96.8 F (36 C) (Temporal)    Ht 5\' 3"  (1.6 m)    Wt 158 lb 6 oz (71.8 kg)    LMP 06/22/2000    SpO2 98%    BMI 28.05 kg/m   Body mass index is 28.05 kg/m.  Advanced Directives 11/27/2019 03/02/2017 02/25/2016 10/15/2014 02/20/2014 02/13/2014 06/17/2012  Does Patient Have a Medical Advance Directive? Yes Yes Yes No;Yes Yes Yes Patient has advance directive, copy not in chart  Type of Advance Directive [place];Living will Living will Living will Mathe[place];Living will - [place];Living will -  Does patient want to make changes to medical advance directive? No - Patient declined No - Patient declined No - Patient declined No - Patient declined - No - Patient declined -  Copy of [place] in Chart? No - copy requested - No - copy requested No - copy requested - No - copy requested -    Tobacco Social History   Tobacco Use  Smoking Status Never Smoker  Smokeless Tobacco Never Used     Counseling given: Not Answered   Clinical Intake: Pain : No/denies pain     Past Medical History:  Diagnosis Date   Anemia, unspecified    Anxiety    Arthritis    Asthma    Barrett's esophagus    Breast cancer ([place])    right breast invasive ductal carcinoma    Breast fibrocystic disorder    Bronchitis    Chicken pox    Constipation,  chronic    Depressive disorder, not elsewhere classified    Diabetes mellitus    borderline   Essential hypertension, benign    Exercise induced bronchospasm    Genital warts    GERD (gastroesophageal reflux disease)    Hyperlipidemia    Infected postoperative breast seroma    Insomnia, unspecified    Migraine, unspecified, without mention of intractable migraine without mention of status migrainosus    Obesity, unspecified    Osteoarthrosis, unspecified whether generalized or localized, ankle and foot    ankle/foot   Personal history of colonic polyps    Symptomatic menopausal or female climacteric states    Past Surgical History:  Procedure Laterality Date   BREAST LUMPECTOMY WITH NEEDLE LOCALIZATION AND AXILLARY SENTINEL LYMPH NODE BX  06/08/2012   Procedure: BREAST LUMPECTOMY WITH NEEDLE LOCALIZATION AND AXILLARY SENTINEL LYMPH NODE BX;  Surgeon: Rolm Bookbinder, MD;  Location: [facility];  Service: General;  Laterality: Right;   BREAST SURGERY     BUNIONECTOMY     3 surgeries on both feet   CHOLECYSTECTOMY     COLONOSCOPY     DILATION AND CURETTAGE OF UTERUS  1978   EYE SURGERY     Orbital right  eye surgery   FOOT SURGERY     Left-revision   JOINT REPLACEMENT     NASAL SEPTUM SURGERY     RE-EXCISION OF BREAST CANCER,SUPERIOR MARGINS  06/27/2012   Procedure: RE-EXCISION OF BREAST CANCER,SUPERIOR MARGINS;  Surgeon: Rolm Bookbinder, MD;  Location: [facility];  Service: General;  Laterality: Right;   TOTAL HIP ARTHROPLASTY     left      Perthes disease   TOTAL KNEE ARTHROPLASTY Left 02/20/2014   Procedure: TOTAL KNEE ARTHROPLASTY;  Surgeon: Hessie Dibble, MD;  Location: [facility];  Service: Orthopedics;  Laterality: Left;   Family History  Adopted: Yes  Problem Relation Age of Onset   Hypertension Mother    Diabetes Mother    Heart failure Mother    Heart disease Mother    Hyperlipidemia Mother    Stroke  Mother    Mental illness Mother    Cancer Maternal Grandmother 42       non hodgkins lymphoma   Cancer Maternal Grandfather    Deep vein thrombosis Brother    Social History   Socioeconomic History   Marital status: Married    Spouse name: Shanon Brow   Number of children: 0   Years of education: college   Highest education level: Not on file  Occupational History   Occupation: retired    Fish farm manager: [employer]    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 No domestic abuse; 5 cats; no children.      Lives: with husband, 5 cats.      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      Tobacco: none       Alcohol: none      Drugs: none      Exercise:  Stair stepper x 30 minutes five days per week.  Spin gym weights daily.  Gardening.      Caffeine: + Moderate caffeine use.          Seatbelt: 100%; no texting while driving.      Guns:  Unloaded gun.      Advanced Directives:  Living Will --- FULL CODE but no prolonged measures.        ADLs: independent with ADLs; no assistant devices; drives.   Social Determinants of Health   Financial Resource Strain: Low Risk    Difficulty of Paying Living Expenses: Not hard at all  Food Insecurity: No Food Insecurity   Worried About Charity fundraiser in the Last Year: Never true   [place] in the Last Year: Never true  Transportation Needs: No Transportation Needs   Lack of Transportation (Medical): No   Lack of Transportation (Non-Medical): No  Physical Activity:    Days of Exercise per Week:     Minutes of Exercise per Session:   Stress:    Feeling of Stress :   Social Connections:    Frequency of Communication with Friends and Family:    Frequency of Social Gatherings  with Friends and Family:    Attends Religious Services:    Active Member of Clubs or Organizations:    Attends Archivist Meetings:    Marital Status:     Outpatient Encounter Medications as of 11/27/2019  Medication Sig   ALPRAZolam (XANAX) 0.25 MG tablet Take 1 tablet (0.25 mg total) by mouth at bedtime as needed for anxiety.   atorvastatin (LIPITOR) 20 MG tablet Take 1 tablet (20 mg total) by mouth daily.   Calcium-Magnesium-Vitamin D (CALCIUM 500 PO) Take 500 mg by mouth 3 (three) times daily.   clindamycin (CLEOCIN) 150 MG capsule Take 600 mg by mouth as needed (dental work).    Coenzyme Q10 (COQ10) 200 MG CAPS Take 200 mg by mouth daily.   diclofenac (VOLTAREN) 75 MG EC tablet Take 1 tablet (75 mg total) by mouth 2 (two) times daily.   Diindolylmethane POWD 1 Package by Does not apply route daily. Reported on 09/10/2015   esomeprazole (NEXIUM) 40 MG capsule Take 1 capsule (40 mg total) by mouth daily.   LevOCARNitine (CARNITINE PO) Take 3,000 mg by mouth daily.    metFORMIN (GLUCOPHAGE-XR) 500 MG 24 hr tablet Take 1 tablet (500 mg total) by mouth 2 (two) times daily.   misoprostol (CYTOTEC) 200 MCG tablet TAKE 1 TABLET BY MOUTH 2 TIMES DAILY.   Multiple Vitamins-Minerals (ANTIOXIDANT PO) Take 1 capsule by mouth daily. Fruit   OVER THE COUNTER MEDICATION Take 1 capsule by mouth daily. Procast: Memory,brain and liver vitamin   OVER THE COUNTER MEDICATION Take 1 capsule by mouth daily. Circulation and vein support vitamin   PARoxetine (PAXIL) 20 MG tablet Take 1 tablet (20 mg total) by mouth daily.   Probiotic Product (PROBIOTIC DAILY PO) Take 1 capsule by mouth daily.   traZODone (DESYREL) 50 MG tablet Take 0.5-1 tablets (25-50 mg total) by mouth at bedtime as needed for sleep.     diphenhydrAMINE (BENADRYL) 25 MG tablet Take 25 mg by mouth at bedtime as needed for sleep. When not taking xanax.   frovatriptan (FROVA) 2.5 MG tablet Take 1 tablet (2.5 mg total) by mouth as needed for migraine (max 3 tabs in 24 hours). If recurs, may repeat after 2 hours. Max of 3 tabs in 24 hours. (Patient not taking: Reported on 11/27/2019)   OVER THE COUNTER MEDICATION Take 1 capsule by mouth daily. Procast package: Elite-100 omega 3 vitamin   [DISCONTINUED] ALPRAZolam (XANAX) 0.25 MG tablet Take 1 tablet (0.25 mg total) by mouth at bedtime as needed for anxiety.   [DISCONTINUED] traZODone (DESYREL) 50 MG tablet Take 0.5-1 tablets (25-50 mg total) by mouth at bedtime as needed for sleep.   No facility-administered encounter medications on file as of 11/27/2019.    Activities of Daily Living In your present state of health, do you have any difficulty performing the following activities: 11/27/2019  Hearing? N  Vision? N  Difficulty concentrating or making decisions? N  Walking or climbing stairs? N  Dressing or bathing? N  Doing errands, shopping? N  Preparing Food and eating ? N  Using the Toilet? N  In the past six months, have you accidently leaked urine? N  Do you have problems with loss of bowel control? N  Managing your Medications? N  Managing your Finances? N  Housekeeping or managing your Housekeeping? N  Some recent data might be hidden    Patient Care Team: Shelda Pal, DO as PCP - General (Family Medicine)    Assessment:  This is a routine wellness examination for Alisha Matthews. Physical assessment deferred to PCP.  Exercise Activities and Dietary recommendations Current Exercise Habits: Home exercise routine, Type of exercise: walking, Time (Minutes): 30, Frequency (Times/Week): 5, Weekly Exercise (Minutes/Week): 150, Intensity: Mild, Exercise limited by: None identified   Diet (meal preparation, eat out, water intake, caffeinated beverages, dairy products,  fruits and vegetables): in general, a "healthy" diet  , well balanced   Goals     Maintain healthy active lifestyle.       Fall Risk Fall Risk  11/27/2019 11/25/2018 08/30/2018 03/09/2018 08/30/2017  Falls in the past year? 0 0 0 No No  Number falls in past yr: 0 0 - - -  Injury with Fall? 0 0 - - -  Follow up Education provided;Falls prevention discussed Falls evaluation completed Falls evaluation completed - -   Depression Screen PHQ 2/9 Scores 11/27/2019 11/25/2018 08/30/2018 03/09/2018  PHQ - 2 Score 0 0 0 0  PHQ- 9 Score - 1 - -     Cognitive Function Ad8 score reviewed for issues:  Issues making decisions:no  Less interest in hobbies / activities:no  Repeats questions, stories (family complaining):no  Trouble using ordinary gadgets (microwave, computer, phone):no  Forgets the month or year: no  Mismanaging finances: no  Remembering appts:no Daily problems with thinking and/or memory:no Ad8 score is=0         Immunization History  Administered Date(s) Administered   Influenza Split 03/22/2012, 04/08/2015   Influenza, High Dose Seasonal PF 04/06/2018   Influenza,inj,Quad PF,6+ Mos 05/01/2013, 03/19/2014   Influenza-Unspecified 04/20/2016, 03/01/2019   Moderna SARS-COVID-2 Vaccination 08/23/2019, 09/21/2019   Pneumococcal Conjugate-13 07/09/2014   Pneumococcal Polysaccharide-23 06/22/2005, 09/10/2015   Tdap 08/30/2017   Zoster 08/01/2013    Screening Tests Health Maintenance  Topic Date Due   DEXA SCAN  11/27/2013   HEMOGLOBIN A1C  12/05/2019   INFLUENZA VACCINE  01/21/2020   OPHTHALMOLOGY EXAM  05/28/2020   URINE MICROALBUMIN  06/05/2020   FOOT EXAM  11/26/2020   MAMMOGRAM  06/12/2021   COLONOSCOPY  08/24/2021   TETANUS/TDAP  08/31/2027   COVID-19 Vaccine  Completed   Hepatitis C Screening  Completed   PNA vac Low Risk Adult  Completed      Plan:    Please schedule your next medicare wellness visit with me in 1 yr.  Continue to  eat heart healthy diet (full of fruits, vegetables, whole grains, lean protein, water--limit salt, fat, and sugar intake) and increase physical activity as tolerated.  Continue doing brain stimulating activities (puzzles, reading, adult coloring books, staying active) to keep memory sharp.   Bring a copy of your living will and/or healthcare power of attorney to your next office visit.   I have personally reviewed and noted the following in the patients chart:    Medical and social history  Use of alcohol, tobacco or illicit drugs   Current medications and supplements  Functional ability and status  Nutritional status  Physical activity  Advanced directives  List of other physicians  Hospitalizations, surgeries, and ER visits in previous 12 months  Vitals  Screenings to include cognitive, depression, and falls  Referrals and appointments  In addition, I have reviewed and discussed with patient certain preventive protocols, quality metrics, and best practice recommendations. A written personalized care plan for preventive services as well as general preventive health recommendations were provided to patient.     Shela Nevin, [place]  11/27/2019

## 2019-11-27 ENCOUNTER — Encounter: Payer: Self-pay | Admitting: *Deleted

## 2019-11-27 ENCOUNTER — Encounter: Payer: Self-pay | Admitting: Family Medicine

## 2019-11-27 ENCOUNTER — Ambulatory Visit (INDEPENDENT_AMBULATORY_CARE_PROVIDER_SITE_OTHER): Payer: Medicare Other | Admitting: Family Medicine

## 2019-11-27 ENCOUNTER — Ambulatory Visit (INDEPENDENT_AMBULATORY_CARE_PROVIDER_SITE_OTHER): Payer: Medicare Other | Admitting: *Deleted

## 2019-11-27 ENCOUNTER — Other Ambulatory Visit: Payer: Self-pay

## 2019-11-27 ENCOUNTER — Other Ambulatory Visit: Payer: Self-pay | Admitting: Family Medicine

## 2019-11-27 VITALS — BP 112/76 | HR 86 | Temp 96.4°F | Ht 63.0 in | Wt 158.4 lb

## 2019-11-27 VITALS — BP 112/76 | HR 86 | Temp 96.8°F | Ht 63.0 in | Wt 158.4 lb

## 2019-11-27 DIAGNOSIS — Z0000 Encounter for general adult medical examination without abnormal findings: Secondary | ICD-10-CM | POA: Diagnosis not present

## 2019-11-27 DIAGNOSIS — F4310 Post-traumatic stress disorder, unspecified: Secondary | ICD-10-CM | POA: Diagnosis not present

## 2019-11-27 DIAGNOSIS — E1165 Type 2 diabetes mellitus with hyperglycemia: Secondary | ICD-10-CM

## 2019-11-27 DIAGNOSIS — E871 Hypo-osmolality and hyponatremia: Secondary | ICD-10-CM

## 2019-11-27 LAB — COMPREHENSIVE METABOLIC PANEL
ALT: 22 U/L (ref 0–35)
AST: 25 U/L (ref 0–37)
Albumin: 4.4 g/dL (ref 3.5–5.2)
Alkaline Phosphatase: 58 U/L (ref 39–117)
BUN: 10 mg/dL (ref 6–23)
CO2: 29 mEq/L (ref 19–32)
Calcium: 9.2 mg/dL (ref 8.4–10.5)
Chloride: 97 mEq/L (ref 96–112)
Creatinine, Ser: 0.61 mg/dL (ref 0.40–1.20)
GFR: 96.69 mL/min (ref >60.00)
Glucose, Bld: 90 mg/dL (ref 70–99)
Potassium: 4.5 mEq/L (ref 3.5–5.1)
Sodium: 131 mEq/L — ABNORMAL LOW (ref 135–145)
Total Bilirubin: 0.4 mg/dL (ref 0.2–1.2)
Total Protein: 6.5 g/dL (ref 6.0–8.3)

## 2019-11-27 LAB — LIPID PANEL
Cholesterol: 190 mg/dL (ref 0–200)
HDL: 82.1 mg/dL (ref >39.00)
LDL Cholesterol: 90 mg/dL (ref 0–99)
NonHDL: 108.37
Total CHOL/HDL Ratio: 2
Triglycerides: 90 mg/dL (ref 0.0–149.0)
VLDL: 18 mg/dL (ref 0.0–40.0)

## 2019-11-27 LAB — HEMOGLOBIN A1C: Hgb A1c MFr Bld: 6.3 % (ref 4.6–6.5)

## 2019-11-27 MED ORDER — TRAZODONE HCL 50 MG PO TABS: 25.0000 mg | ORAL_TABLET | Freq: Every evening | ORAL | 3 refills | Status: DC | PRN

## 2019-11-27 MED ORDER — ALPRAZOLAM 0.25 MG PO TABS: 0.2500 mg | ORAL_TABLET | Freq: Every evening | ORAL | 5 refills | Status: DC | PRN

## 2019-11-27 NOTE — Patient Instructions (Signed)
Please schedule your next medicare wellness visit with me in 1 yr.  Continue to eat heart healthy diet (full of fruits, vegetables, whole grains, lean protein, water--limit salt, fat, and sugar intake) and increase physical activity as tolerated.  Continue doing brain stimulating activities (puzzles, reading, adult coloring books, staying active) to keep memory sharp.   Bring a copy of your living will and/or healthcare power of attorney to your next office visit.   Alisha Matthews , Thank you for taking time to come for your Medicare Wellness Visit. I appreciate your ongoing commitment to your health goals. Please review the following plan we discussed and let me know if I can assist you in the future.   These are the goals we discussed: Goals    . Maintain healthy active lifestyle.       This is a list of the screening recommended for you and due dates:  Health Maintenance  Topic Date Due  . DEXA scan (bone density measurement)  11/26/2020*  . Hemoglobin A1C  12/05/2019  . Flu Shot  01/21/2020  . Eye exam for diabetics  05/28/2020  . Urine Protein Check  06/05/2020  . Complete foot exam   11/26/2020  . Mammogram  06/12/2021  . Colon Cancer Screening  08/24/2021  . Tetanus Vaccine  08/31/2027  . COVID-19 Vaccine  Completed  .  Hepatitis C: One time screening is recommended by Center for Disease Control  (CDC) for  adults born from [birth year] through 1967.   Completed  . Pneumonia vaccines  Completed  *Topic was postponed. The date shown is not the original due date.    Preventive Care 3 Years and Older, Female Preventive care refers to lifestyle choices and visits with your health care provider that can promote health and wellness. This includes:  A yearly physical exam. This is also called an annual well check.  Regular dental and eye exams.  Immunizations.  Screening for certain conditions.  Healthy lifestyle choices, such as diet and exercise. What can I expect for my  preventive care visit? Physical exam Your health care provider will check:  Height and weight. These may be used to calculate body mass index (BMI), which is a measurement that tells if you are at a healthy weight.  Heart rate and blood pressure.  Your skin for abnormal spots. Counseling Your health care provider may ask you questions about:  Alcohol, tobacco, and drug use.  Emotional well-being.  Home and relationship well-being.  Sexual activity.  Eating habits.  History of falls.  Memory and ability to understand (cognition).  Work and work Statistician.  Pregnancy and menstrual history. What immunizations do I need?  Influenza (flu) vaccine  This is recommended every year. Tetanus, diphtheria, and pertussis (Tdap) vaccine  You may need a Td booster every 10 years. Varicella (chickenpox) vaccine  You may need this vaccine if you have not already been vaccinated. Zoster (shingles) vaccine  You may need this after age 65. Pneumococcal conjugate (PCV13) vaccine  One dose is recommended after age 74. Pneumococcal polysaccharide (PPSV23) vaccine  One dose is recommended after age 72. Measles, mumps, and rubella (MMR) vaccine  You may need at least one dose of MMR if you were born in 1957 or later. You may also need a second dose. Meningococcal conjugate (MenACWY) vaccine  You may need this if you have certain conditions. Hepatitis A vaccine  You may need this if you have certain conditions or if you travel or work in places  where you may be exposed to hepatitis A. Hepatitis B vaccine  You may need this if you have certain conditions or if you travel or work in places where you may be exposed to hepatitis B. Haemophilus influenzae type b (Hib) vaccine  You may need this if you have certain conditions. You may receive vaccines as individual doses or as more than one vaccine together in one shot (combination vaccines). Talk with your health care provider  about the risks and benefits of combination vaccines. What tests do I need? Blood tests  Lipid and cholesterol levels. These may be checked every 5 years, or more frequently depending on your overall health.  Hepatitis C test.  Hepatitis B test. Screening  Lung cancer screening. You may have this screening every year starting at age 85 if you have a 30-pack-year history of smoking and currently smoke or have quit within the past 15 years.  Colorectal cancer screening. All adults should have this screening starting at age 28 and continuing until age 22. Your health care provider may recommend screening at age 71 if you are at increased risk. You will have tests every 1-10 years, depending on your results and the type of screening test.  Diabetes screening. This is done by checking your blood sugar (glucose) after you have not eaten for a while (fasting). You may have this done every 1-3 years.  Mammogram. This may be done every 1-2 years. Talk with your health care provider about how often you should have regular mammograms.  BRCA-related cancer screening. This may be done if you have a family history of breast, ovarian, tubal, or peritoneal cancers. Other tests  Sexually transmitted disease (STD) testing.  Bone density scan. This is done to screen for osteoporosis. You may have this done starting at age 70. Follow these instructions at home: Eating and drinking  Eat a diet that includes fresh fruits and vegetables, whole grains, lean protein, and low-fat dairy products. Limit your intake of foods with high amounts of sugar, saturated fats, and salt.  Take vitamin and mineral supplements as recommended by your health care provider.  Do not drink alcohol if your health care provider tells you not to drink.  If you drink alcohol: ? Limit how much you have to 0-1 drink a day. ? Be aware of how much alcohol is in your drink. In the [place] one drink equals one 12 oz bottle of beer (355  mL), one 5 oz glass of wine (148 mL), or one 1 oz glass of hard liquor (44 mL). Lifestyle  Take daily care of your teeth and gums.  Stay active. Exercise for at least 30 minutes on 5 or more days each week.  Do not use any products that contain nicotine or tobacco, such as cigarettes, e-cigarettes, and chewing tobacco. If you need help quitting, ask your health care provider.  If you are sexually active, practice safe sex. Use a condom or other form of protection in order to prevent STIs (sexually transmitted infections).  Talk with your health care provider about taking a low-dose aspirin or statin. What's next?  Go to your health care provider once a year for a well check visit.  Ask your health care provider how often you should have your eyes and teeth checked.  Stay up to date on all vaccines. This information is not intended to replace advice given to you by your health care provider. Make sure you discuss any questions you have with your health care  provider. Document Revised: 06/02/2018 Document Reviewed: 06/02/2018 Elsevier Patient Education  2020 [employer].

## 2019-11-27 NOTE — Progress Notes (Signed)
Subjective:   Chief Complaint  Patient presents with  . Follow-up    Alisha Matthews is a 69 y.o. female here for follow-up of diabetes.   [name] does not monitor her sugars routinely.  Patient does not require insulin.   Medications include: metformin XR 500 mg bid Exercise: walking Diet is usually healthy.   Pt w hx of anxiety/depression/PTSD Taking Paxil 20 mg/d, reports compliance. No AE's. Controlled overall.  Xanax 0.25 mg prn.  Trazodone as a benzo sparing agent every 3 d on average.  No SI or HI.   Past Medical History:  Diagnosis Date  . Anemia, unspecified   . Anxiety   . Arthritis   . Asthma   . Barrett's esophagus   . Breast cancer ([place])    right breast invasive ductal carcinoma   . Breast fibrocystic disorder   . Bronchitis   . Chicken pox   . Constipation, chronic   . Depressive disorder, not elsewhere classified   . Diabetes mellitus    borderline  . Essential hypertension, benign   . Exercise induced bronchospasm   . Genital warts   . GERD (gastroesophageal reflux disease)   . Hyperlipidemia   . Infected postoperative breast seroma   . Insomnia, unspecified   . Migraine, unspecified, without mention of intractable migraine without mention of status migrainosus   . Obesity, unspecified   . Osteoarthrosis, unspecified whether generalized or localized, ankle and foot    ankle/foot  . Personal history of colonic polyps   . Symptomatic menopausal or female climacteric states      Related testing: Date of retinal exam: Done Pneumovax: done  Objective:  BP 112/76 (BP Location: Left Arm, Patient Position: Sitting, Cuff Size: Normal)   Pulse 86   Temp (!) 96.4 F (35.8 C) (Temporal)   Ht 5\' 3"  (1.6 m)   Wt 158 lb 6 oz (71.8 kg)   LMP 06/22/2000   SpO2 98%   BMI 28.05 kg/m  General:  Well developed, well nourished, in no apparent distress Skin:  Warm, no pallor or diaphoresis Head:  Normocephalic, atraumatic Eyes:  Pupils equal and round, sclera  anicteric without injection  Lungs:  CTAB, no access msc use Cardio:  RRR, no bruits, no LE edema Musculoskeletal:  Symmetrical muscle groups noted without atrophy or deformity Neuro:  Sensation intact to pinprick on feet Psych: Age appropriate judgment and insight  Assessment:   Type 2 diabetes mellitus with hyperglycemia, without long-term current use of insulin (HCC) - Plan: Hemoglobin A1c, Lipid panel, Comprehensive metabolic panel  PTSD (post-traumatic stress disorder)   Plan:  1-continue Metformin.  Counseled on diet and exercise.  Check above labs. 2-continue Paxil and as needed trazodone/alprazolam.  CSC is up-to-date. Follow-up in 6 months. The patient voiced understanding and agreement to the plan.  [place], DO 11/27/19 10:40 AM

## 2019-11-27 NOTE — Patient Instructions (Addendum)
In December, we will get your urine testing.  Give [place] 2-3 business days to get the results of your labs back.   Keep the diet clean and stay active.  Let us know if you need anything.

## 2019-12-12 ENCOUNTER — Other Ambulatory Visit: Payer: Self-pay

## 2019-12-12 ENCOUNTER — Other Ambulatory Visit (INDEPENDENT_AMBULATORY_CARE_PROVIDER_SITE_OTHER): Payer: Medicare Other

## 2019-12-12 DIAGNOSIS — E871 Hypo-osmolality and hyponatremia: Secondary | ICD-10-CM

## 2019-12-12 LAB — BASIC METABOLIC PANEL
BUN: 10 mg/dL (ref 6–23)
CO2: 29 mEq/L (ref 19–32)
Calcium: 9.3 mg/dL (ref 8.4–10.5)
Chloride: 103 mEq/L (ref 96–112)
Creatinine, Ser: 0.67 mg/dL (ref 0.40–1.20)
GFR: 86.76 mL/min (ref >60.00)
Glucose, Bld: 107 mg/dL — ABNORMAL HIGH (ref 70–99)
Potassium: 4 mEq/L (ref 3.5–5.1)
Sodium: 136 mEq/L (ref 135–145)

## 2020-02-01 ENCOUNTER — Other Ambulatory Visit: Payer: Self-pay | Admitting: Family Medicine

## 2020-02-01 DIAGNOSIS — F4310 Post-traumatic stress disorder, unspecified: Secondary | ICD-10-CM

## 2020-02-01 MED ORDER — PAROXETINE HCL 20 MG PO TABS: 20.0000 mg | ORAL_TABLET | Freq: Every day | ORAL | 0 refills | Status: DC

## 2020-02-01 NOTE — Telephone Encounter (Signed)
Medication: PARoxetine (PAXIL) 20 MG tablet [[id number]]    Has the patient contacted their pharmacy? No. (If no, request that the patient contact the pharmacy for the refill.) (If yes, when and what did the pharmacy advise?)  Preferred Pharmacy (with phone number or street name): [facility] [address]  [place], [place] [address]  Phone:  [phone] Fax:  [phone]  DEA #:  [phone]  Agent: Please be advised that RX refills may take up to 3 business days. We ask that you follow-up with your pharmacy.

## 2020-02-01 NOTE — Telephone Encounter (Signed)
Rx sent 

## 2020-03-05 ENCOUNTER — Encounter: Payer: Self-pay | Admitting: Family Medicine

## 2020-03-05 ENCOUNTER — Ambulatory Visit (INDEPENDENT_AMBULATORY_CARE_PROVIDER_SITE_OTHER): Payer: Medicare Other | Admitting: Family Medicine

## 2020-03-05 ENCOUNTER — Other Ambulatory Visit: Payer: Self-pay

## 2020-03-05 VITALS — BP 108/70 | HR 74 | Temp 98.7°F | Ht 63.0 in | Wt 154.4 lb

## 2020-03-05 DIAGNOSIS — G8929 Other chronic pain: Secondary | ICD-10-CM

## 2020-03-05 DIAGNOSIS — M25511 Pain in right shoulder: Secondary | ICD-10-CM | POA: Diagnosis not present

## 2020-03-05 DIAGNOSIS — F4310 Post-traumatic stress disorder, unspecified: Secondary | ICD-10-CM | POA: Diagnosis not present

## 2020-03-05 MED ORDER — METHOCARBAMOL 500 MG PO TABS: ORAL_TABLET | ORAL | 0 refills | Status: DC

## 2020-03-05 MED ORDER — PAROXETINE HCL 20 MG PO TABS: 20.0000 mg | ORAL_TABLET | Freq: Every day | ORAL | 0 refills | Status: DC

## 2020-03-05 NOTE — Patient Instructions (Addendum)
I recommend getting the flu shot in mid October. This suggestion would change if the CDC comes out with a different recommendation.   Keep the diet clean and stay active.   Let us know if you need anything.

## 2020-03-05 NOTE — Progress Notes (Signed)
Chief Complaint  Patient presents with  . Follow-up    Subjective: Patient is a 69 y.o. female here for f/u.  Patient been dealing with chronic right shoulder pain.  Her specialist has prescribed her Robaxin.  She has an expired prescription and does not see them for another month.  She is requesting a refill to hold her over until she can see the specialist.  She has had decreased range of motion, pain, and tightness.  She has been using heat in addition to judicious use of her muscle relaxant, Robaxin 500 mg.  No weakness.  Past Medical History:  Diagnosis Date  . Anemia, unspecified   . Anxiety   . Arthritis   . Asthma   . Barrett's esophagus   . Breast cancer ([place])    right breast invasive ductal carcinoma   . Breast fibrocystic disorder   . Bronchitis   . Chicken pox   . Constipation, chronic   . Depressive disorder, not elsewhere classified   . Diabetes mellitus    borderline  . Essential hypertension, benign   . Exercise induced bronchospasm   . Genital warts   . GERD (gastroesophageal reflux disease)   . Hyperlipidemia   . Infected postoperative breast seroma   . Insomnia, unspecified   . Migraine, unspecified, without mention of intractable migraine without mention of status migrainosus   . Obesity, unspecified   . Osteoarthrosis, unspecified whether generalized or localized, ankle and foot    ankle/foot  . Personal history of colonic polyps   . Symptomatic menopausal or female climacteric states     Objective: BP 108/70 (BP Location: Left Arm, Patient Position: Sitting, Cuff Size: Large)   Pulse 74   Temp 98.7 F (37.1 C) (Oral)   Ht 5\' 3"  (1.6 m)   Wt 154 lb 6 oz (70 kg)   LMP 06/22/2000   SpO2 98%   BMI 27.35 kg/m  General: Awake, appears stated age MSK: Decreased active and passive range of motion of the right shoulder; tenderness over the proximal biceps tendon, trapezius; positive Neer's, Hawkins, empty can, speeds, negative crossover, liftoff  equivocal Lungs: No accessory muscle use Psych: Age appropriate judgment and insight, normal affect and mood  Assessment and Plan: Chronic right shoulder pain  PTSD (post-traumatic stress disorder) - Plan: PARoxetine (PAXIL) 20 MG tablet  I will refill her Robaxin to bridge her until she can see her specialist.  Continue stretches. Refill Paxil. I will see her in 3 months for her regular 54-month check. The patient voiced understanding and agreement to the plan.  [place], DO 03/05/20  12:01 PM

## 2020-03-12 DIAGNOSIS — Z23 Encounter for immunization: Secondary | ICD-10-CM | POA: Diagnosis not present

## 2020-03-25 DIAGNOSIS — E119 Type 2 diabetes mellitus without complications: Secondary | ICD-10-CM | POA: Diagnosis not present

## 2020-03-25 LAB — HM DIABETES EYE EXAM

## 2020-03-28 ENCOUNTER — Other Ambulatory Visit: Payer: Self-pay | Admitting: Family Medicine

## 2020-03-28 DIAGNOSIS — F4310 Post-traumatic stress disorder, unspecified: Secondary | ICD-10-CM

## 2020-04-16 DIAGNOSIS — Z23 Encounter for immunization: Secondary | ICD-10-CM | POA: Diagnosis not present

## 2020-04-24 DIAGNOSIS — Z96642 Presence of left artificial hip joint: Secondary | ICD-10-CM | POA: Diagnosis not present

## 2020-04-24 DIAGNOSIS — M19011 Primary osteoarthritis, right shoulder: Secondary | ICD-10-CM | POA: Diagnosis not present

## 2020-04-24 DIAGNOSIS — Z96652 Presence of left artificial knee joint: Secondary | ICD-10-CM | POA: Diagnosis not present

## 2020-04-24 DIAGNOSIS — Z471 Aftercare following joint replacement surgery: Secondary | ICD-10-CM | POA: Diagnosis not present

## 2020-05-06 DIAGNOSIS — M19011 Primary osteoarthritis, right shoulder: Secondary | ICD-10-CM | POA: Diagnosis not present

## 2020-05-10 ENCOUNTER — Other Ambulatory Visit: Payer: Self-pay | Admitting: Orthopedic Surgery

## 2020-05-28 ENCOUNTER — Other Ambulatory Visit: Payer: Self-pay | Admitting: Family Medicine

## 2020-05-28 DIAGNOSIS — F4310 Post-traumatic stress disorder, unspecified: Secondary | ICD-10-CM

## 2020-05-28 NOTE — Telephone Encounter (Signed)
Requesting: alprazolam 0.25mg  Contract: None UDS: None Last Visit: 03/05/2020 Next Visit: 06/04/2020 Last Refill: 11/27/2019 #30 and 5RF Pt sig: 1 tab qhs prn  Please Advise

## 2020-05-30 ENCOUNTER — Other Ambulatory Visit: Payer: Self-pay | Admitting: Orthopedic Surgery

## 2020-06-04 ENCOUNTER — Encounter: Payer: Self-pay | Admitting: Family Medicine

## 2020-06-04 ENCOUNTER — Other Ambulatory Visit: Payer: Self-pay

## 2020-06-04 ENCOUNTER — Ambulatory Visit (INDEPENDENT_AMBULATORY_CARE_PROVIDER_SITE_OTHER): Payer: Medicare Other | Admitting: Family Medicine

## 2020-06-04 VITALS — BP 120/86 | HR 85 | Temp 98.3°F | Ht 63.0 in | Wt 148.2 lb

## 2020-06-04 DIAGNOSIS — M25511 Pain in right shoulder: Secondary | ICD-10-CM

## 2020-06-04 DIAGNOSIS — E1165 Type 2 diabetes mellitus with hyperglycemia: Secondary | ICD-10-CM | POA: Diagnosis not present

## 2020-06-04 DIAGNOSIS — E7800 Pure hypercholesterolemia, unspecified: Secondary | ICD-10-CM | POA: Diagnosis not present

## 2020-06-04 DIAGNOSIS — K219 Gastro-esophageal reflux disease without esophagitis: Secondary | ICD-10-CM

## 2020-06-04 DIAGNOSIS — M25512 Pain in left shoulder: Secondary | ICD-10-CM

## 2020-06-04 LAB — LIPID PANEL
Cholesterol: 212 mg/dL — ABNORMAL HIGH (ref 0–200)
HDL: 88.7 mg/dL (ref >39.00)
LDL Cholesterol: 101 mg/dL — ABNORMAL HIGH (ref 0–99)
NonHDL: 123.61
Total CHOL/HDL Ratio: 2
Triglycerides: 111 mg/dL (ref 0.0–149.0)
VLDL: 22.2 mg/dL (ref 0.0–40.0)

## 2020-06-04 LAB — HEMOGLOBIN A1C: Hgb A1c MFr Bld: 5.9 % (ref 4.6–6.5)

## 2020-06-04 LAB — COMPREHENSIVE METABOLIC PANEL
ALT: 23 U/L (ref 0–35)
AST: 23 U/L (ref 0–37)
Albumin: 4.5 g/dL (ref 3.5–5.2)
Alkaline Phosphatase: 58 U/L (ref 39–117)
BUN: 9 mg/dL (ref 6–23)
CO2: 27 mEq/L (ref 19–32)
Calcium: 9.7 mg/dL (ref 8.4–10.5)
Chloride: 100 mEq/L (ref 96–112)
Creatinine, Ser: 0.72 mg/dL (ref 0.40–1.20)
GFR: 84.06 mL/min (ref >60.00)
Glucose, Bld: 83 mg/dL (ref 70–99)
Potassium: 4 mEq/L (ref 3.5–5.1)
Sodium: 135 mEq/L (ref 135–145)
Total Bilirubin: 0.4 mg/dL (ref 0.2–1.2)
Total Protein: 7 g/dL (ref 6.0–8.3)

## 2020-06-04 LAB — MICROALBUMIN / CREATININE URINE RATIO
Creatinine,U: 45.8 mg/dL
Microalb Creat Ratio: 1.5 mg/g (ref 0.0–30.0)
Microalb, Ur: <0.7 mg/dL (ref 0.0–1.9)

## 2020-06-04 MED ORDER — HYDROCODONE-ACETAMINOPHEN 5-325 MG PO TABS: 1.0000 | ORAL_TABLET | Freq: Four times a day (QID) | ORAL | 0 refills | Status: DC | PRN

## 2020-06-04 MED ORDER — ATORVASTATIN CALCIUM 20 MG PO TABS: 20.0000 mg | ORAL_TABLET | Freq: Every day | ORAL | 3 refills | Status: DC

## 2020-06-04 MED ORDER — MISOPROSTOL 200 MCG PO TABS: ORAL_TABLET | ORAL | 2 refills | Status: DC

## 2020-06-04 MED ORDER — DICLOFENAC SODIUM 75 MG PO TBEC: 75.0000 mg | DELAYED_RELEASE_TABLET | Freq: Two times a day (BID) | ORAL | 3 refills | Status: DC

## 2020-06-04 MED ORDER — METFORMIN HCL ER 500 MG PO TB24: 500.0000 mg | ORAL_TABLET | Freq: Two times a day (BID) | ORAL | 3 refills | Status: DC

## 2020-06-04 NOTE — Progress Notes (Signed)
Subjective:   Chief Complaint  Patient presents with  . Follow-up    Alisha Matthews is a 69 y.o. female here for follow-up of diabetes.   Alisha Matthews does not routinely check her sugars.  Patient does not require insulin.   Medications include: metformin XR 500 mg bid Diet is healthy overall.  Exercise: walking  R shoulder pain Chronic, has replacement scheduled for next mo. Ortho does not want to rx pain meds per pt, requesting short course of pain medicine until she can get her procedure. No EtOH or illicit drug use.   Past Medical History:  Diagnosis Date  . Anemia, unspecified   . Anxiety   . Arthritis   . Asthma   . Barrett's esophagus   . Breast cancer ([place])    right breast invasive ductal carcinoma   . Breast fibrocystic disorder   . Bronchitis   . Chicken pox   . Constipation, chronic   . Depressive disorder, not elsewhere classified   . Diabetes mellitus    borderline  . Essential hypertension, benign   . Exercise induced bronchospasm   . Genital warts   . GERD (gastroesophageal reflux disease)   . Hyperlipidemia   . Infected postoperative breast seroma   . Insomnia, unspecified   . Migraine, unspecified, without mention of intractable migraine without mention of status migrainosus   . Obesity, unspecified   . Osteoarthrosis, unspecified whether generalized or localized, ankle and foot    ankle/foot  . Personal history of colonic polyps   . Symptomatic menopausal or female climacteric states      Related testing: Retinal exam: Done Pneumovax: done  Objective:  BP 120/86 (BP Location: Left Arm, Patient Position: Sitting, Cuff Size: Large)   Pulse 85   Temp 98.3 F (36.8 C) (Oral)   Ht 5\' 3"  (1.6 m)   Wt 148 lb 4 oz (67.2 kg)   LMP 06/22/2000   SpO2 97%   BMI 26.26 kg/m  General:  Well developed, well nourished, in no apparent distress Skin:  Warm, no pallor or diaphoresis Eyes:  Pupils equal and round, sclera anicteric without injection  Lungs:  CTAB,  no access msc use Cardio:  RRR, no bruits, no LE edema Psych: Age appropriate judgment and insight  Assessment:   Type 2 diabetes mellitus with hyperglycemia, without long-term current use of insulin (HCC) - Plan: metFORMIN (GLUCOPHAGE-XR) 500 MG 24 hr tablet, atorvastatin (LIPITOR) 20 MG tablet, Hemoglobin A1c, Microalbumin / creatinine urine ratio, Comprehensive metabolic panel, Lipid panel  Pure hypercholesterolemia - Plan: atorvastatin (LIPITOR) 20 MG tablet  Bilateral shoulder pain, unspecified chronicity - Plan: HYDROcodone-acetaminophen (NORCO/VICODIN) 5-325 MG tablet, diclofenac (VOLTAREN) 75 MG EC tablet  Gastroesophageal reflux disease without esophagitis - Plan: misoprostol (CYTOTEC) 200 MCG tablet   Plan:  1. Diet controlled, ck A1c and urine today. F/u in 6 mo. 2. Cont Lipitor. 3. Short course of Norco for shoulder pain requiring surg. Warnings about this medication verbalized and written down. Don't take with Xanax. Unfortunately because of the shoulder, she has been using it more often.  The patient voiced understanding and agreement to the plan.  [place], DO 06/04/20 10:04 AM

## 2020-06-04 NOTE — Patient Instructions (Addendum)
Do not drink alcohol, do any illicit/street drugs, drive or do anything that requires alertness while on the Norco/Vicodin.  Don't take it with Xanax either.   Give [place] 2-3 business days to get the results of your labs back.   Keep the diet clean and stay active.  Let us know if you need anything.

## 2020-06-13 ENCOUNTER — Encounter: Payer: Self-pay | Admitting: Family Medicine

## 2020-06-13 DIAGNOSIS — Z1231 Encounter for screening mammogram for malignant neoplasm of breast: Secondary | ICD-10-CM | POA: Diagnosis not present

## 2020-06-13 NOTE — Progress Notes (Signed)
DUE TO COVID-19 ONLY ONE VISITOR IS ALLOWED TO COME WITH YOU AND STAY IN THE WAITING ROOM ONLY DURING PRE OP AND PROCEDURE DAY OF SURGERY. THE 1 VISITOR  MAY VISIT WITH YOU AFTER SURGERY IN YOUR PRIVATE ROOM DURING VISITING HOURS ONLY!  YOU NEED TO HAVE A COVID 19 TEST ON___1/08/2020 ____ @_______ , THIS TEST MUST BE DONE BEFORE SURGERY,  COVID TESTING SITE [address] [place] [address], IT IS ON THE RIGHT GOING OUT [address] APPROXIMATELY  2 MINUTES PAST [employer] ON THE RIGHT. ONCE YOUR COVID TEST IS COMPLETED,  PLEASE BEGIN THE QUARANTINE INSTRUCTIONS AS OUTLINED IN YOUR HANDOUT.                Alisha Matthews  06/13/2020   Your procedure is scheduled on:   06/27/2020    Report to [facility] Main  Entrance   Report to admitting at    0530 AM     Call this number if you have problems the morning of surgery [phone]    REMEMBER: NO  SOLID FOOD CANDY OR GUM AFTER MIDNIGHT. CLEAR LIQUIDS UNTIL    0430am         . NOTHING BY MOUTH EXCEPT CLEAR LIQUIDS UNTIL    . PLEASE FINISH ENSURE DRINK PER SURGEON ORDER  WHICH NEEDS TO BE COMPLETED AT  0430am     .      CLEAR LIQUID DIET   Foods Allowed                                                                    Coffee and tea, regular and decaf                            Fruit ices (not with fruit pulp)                                      Iced Popsicles                                    Carbonated beverages, regular and diet                                    Cranberry, grape and apple juices Sports drinks like Gatorade Lightly seasoned clear broth or consume(fat free) Sugar, honey syrup ___________________________________________________________________      BRUSH YOUR TEETH MORNING OF SURGERY AND RINSE YOUR MOUTH OUT, NO CHEWING GUM CANDY OR MINTS.     Take these medicines the morning of surgery with A SIP OF WATER: nexium, paxil   DO NOT TAKE ANY DIABETIC MEDICATIONS DAY OF YOUR SURGERY                                You may not have any metal on your body including hair pins and  piercings  Do not wear jewelry, make-up, lotions, powders or perfumes, deodorant             Do not wear nail polish on your fingernails.  Do not shave  48 hours prior to surgery.              Men may shave face and neck.   Do not bring valuables to the hospital. [place].  Contacts, dentures or bridgework may not be worn into surgery.  Leave suitcase in the car. After surgery it may be brought to your room.     Patients discharged the day of surgery will not be allowed to drive home. IF YOU ARE HAVING SURGERY AND GOING HOME THE SAME DAY, YOU MUST HAVE AN ADULT TO DRIVE YOU HOME AND BE WITH YOU FOR 24 HOURS. YOU MAY GO HOME BY TAXI OR UBER OR ORTHERWISE, BUT AN ADULT MUST ACCOMPANY YOU HOME AND STAY WITH YOU FOR 24 HOURS.  Name and phone number of your driver:  Special Instructions: N/A              Please read over the following fact sheets you were given: _____________________________________________________________________  [facility] - Preparing for Surgery Before surgery, you can play an important role.  Because skin is not sterile, your skin needs to be as free of germs as possible.  You can reduce the number of germs on your skin by washing with CHG (chlorahexidine gluconate) soap before surgery.  CHG is an antiseptic cleaner which kills germs and bonds with the skin to continue killing germs even after washing. Please DO NOT use if you have an allergy to CHG or antibacterial soaps.  If your skin becomes reddened/irritated stop using the CHG and inform your nurse when you arrive at Short Stay. Do not shave (including legs and underarms) for at least 48 hours prior to the first CHG shower.  You may shave your face/neck. Please follow these instructions carefully:  1.  Shower with CHG Soap the night before surgery and the  morning of  Surgery.  2.  If you choose to wash your hair, wash your hair first as usual with your  normal  shampoo.  3.  After you shampoo, rinse your hair and body thoroughly to remove the  shampoo.                           4.  Use CHG as you would any other liquid soap.  You can apply chg directly  to the skin and wash                       Gently with a scrungie or clean washcloth.  5.  Apply the CHG Soap to your body ONLY FROM THE NECK DOWN.   Do not use on face/ open                           Wound or open sores. Avoid contact with eyes, ears mouth and genitals (private parts).                       Wash face,  Genitals (private parts) with your normal soap.             6.  Wash thoroughly, paying special attention to the area where your surgery  will be performed.  7.  Thoroughly rinse your body with warm water from the neck down.  8.  DO NOT shower/wash with your normal soap after using and rinsing off  the CHG Soap.                9.  Pat yourself dry with a clean towel.            10.  Wear clean pajamas.            11.  Place clean sheets on your bed the night of your first shower and do not  sleep with pets. Day of Surgery : Do not apply any lotions/deodorants the morning of surgery.  Please wear clean clothes to the hospital/surgery center.  FAILURE TO FOLLOW THESE INSTRUCTIONS MAY RESULT IN THE CANCELLATION OF YOUR SURGERY PATIENT SIGNATURE_________________________________  NURSE SIGNATURE__________________________________  ________________________________________________________________________   [facility]- Preparing for Total Shoulder Arthroplasty    Before surgery, you can play an important role. Because skin is not sterile, your skin needs to be as free of germs as possible. You can reduce the number of germs on your skin by using the following products. . Benzoyl Peroxide Gel o Reduces the number of germs present on the skin o Applied twice a day to shoulder area starting two  days before surgery    ==================================================================  Please follow these instructions carefully:  BENZOYL PEROXIDE 5% GEL  Please do not use if you have an allergy to benzoyl peroxide.   If your skin becomes reddened/irritated stop using the benzoyl peroxide.  Starting two days before surgery, apply as follows: 1. Apply benzoyl peroxide in the morning and at night. Apply after taking a shower. If you are not taking a shower clean entire shoulder front, back, and side along with the armpit with a clean wet washcloth.  2. Place a quarter-sized dollop on your shoulder and rub in thoroughly, making sure to cover the front, back, and side of your shoulder, along with the armpit.   2 days before ____ AM   ____ PM              1 day before ____ AM   ____ PM                         3. Do this twice a day for two days.  (Last application is the night before surgery, AFTER using the CHG soap as described below).  4. Do NOT apply benzoyl peroxide gel on the day of surgery.

## 2020-06-17 ENCOUNTER — Encounter (HOSPITAL_COMMUNITY)
Admission: RE | Admit: 2020-06-17 | Discharge: 2020-06-17 | Disposition: A | Discharge status: Home / Self Care | Payer: Medicare Other | Source: Ambulatory Visit | Attending: Orthopedic Surgery | Admitting: Orthopedic Surgery

## 2020-06-17 ENCOUNTER — Other Ambulatory Visit: Payer: Self-pay

## 2020-06-17 ENCOUNTER — Ambulatory Visit (HOSPITAL_COMMUNITY)
Admission: RE | Admit: 2020-06-17 | Discharge: 2020-06-17 | Disposition: A | Discharge status: Home / Self Care | Payer: Medicare Other | Source: Ambulatory Visit | Attending: Orthopedic Surgery | Admitting: Orthopedic Surgery

## 2020-06-17 ENCOUNTER — Encounter (HOSPITAL_COMMUNITY): Payer: Self-pay

## 2020-06-17 DIAGNOSIS — Z01818 Encounter for other preprocedural examination: Secondary | ICD-10-CM | POA: Diagnosis not present

## 2020-06-17 DIAGNOSIS — Z01811 Encounter for preprocedural respiratory examination: Secondary | ICD-10-CM

## 2020-06-17 DIAGNOSIS — M25611 Stiffness of right shoulder, not elsewhere classified: Secondary | ICD-10-CM | POA: Diagnosis not present

## 2020-06-17 DIAGNOSIS — M19011 Primary osteoarthritis, right shoulder: Secondary | ICD-10-CM | POA: Diagnosis not present

## 2020-06-17 DIAGNOSIS — R531 Weakness: Secondary | ICD-10-CM | POA: Diagnosis not present

## 2020-06-17 LAB — CBC WITH DIFFERENTIAL/PLATELET
Abs Immature Granulocytes: 0.01 10*3/uL (ref 0.00–0.07)
Basophils Absolute: 0 10*3/uL (ref 0.0–0.1)
Basophils Relative: 1 %
Eosinophils Absolute: 0.2 10*3/uL (ref 0.0–0.5)
Eosinophils Relative: 3 %
HCT: 34.8 % — ABNORMAL LOW (ref 36.0–46.0)
Hemoglobin: 11.2 g/dL — ABNORMAL LOW (ref 12.0–15.0)
Immature Granulocytes: 0 %
Lymphocytes Relative: 37 %
Lymphs Abs: 1.8 10*3/uL (ref 0.7–4.0)
MCH: 28.8 pg (ref 26.0–34.0)
MCHC: 32.2 g/dL (ref 30.0–36.0)
MCV: 89.5 fL (ref 80.0–100.0)
Monocytes Absolute: 0.4 10*3/uL (ref 0.1–1.0)
Monocytes Relative: 8 %
Neutro Abs: 2.5 10*3/uL (ref 1.7–7.7)
Neutrophils Relative %: 51 %
Platelets: 210 10*3/uL (ref 150–400)
RBC: 3.89 MIL/uL (ref 3.87–5.11)
RDW: 13.3 % (ref 11.5–15.5)
WBC: 4.9 10*3/uL (ref 4.0–10.5)
nRBC: 0 % (ref 0.0–0.2)

## 2020-06-17 LAB — APTT: aPTT: 29 seconds (ref 24–36)

## 2020-06-17 LAB — URINALYSIS, ROUTINE W REFLEX MICROSCOPIC
Bilirubin Urine: NEGATIVE
Glucose, UA: NEGATIVE mg/dL
Hgb urine dipstick: NEGATIVE
Ketones, ur: NEGATIVE mg/dL
Leukocytes,Ua: NEGATIVE
Nitrite: NEGATIVE
Protein, ur: NEGATIVE mg/dL
Specific Gravity, Urine: 1.011 (ref 1.005–1.030)
pH: 5 (ref 5.0–8.0)

## 2020-06-17 LAB — SURGICAL PCR SCREEN
MRSA, PCR: NEGATIVE
Staphylococcus aureus: NEGATIVE

## 2020-06-17 LAB — COMPREHENSIVE METABOLIC PANEL
ALT: 25 U/L (ref 0–44)
AST: 24 U/L (ref 15–41)
Albumin: 4 g/dL (ref 3.5–5.0)
Alkaline Phosphatase: 54 U/L (ref 38–126)
Anion gap: 8 (ref 5–15)
BUN: 10 mg/dL (ref 8–23)
CO2: 25 mmol/L (ref 22–32)
Calcium: 9 mg/dL (ref 8.9–10.3)
Chloride: 104 mmol/L (ref 98–111)
Creatinine, Ser: 0.63 mg/dL (ref 0.44–1.00)
GFR, Estimated: >60 mL/min (ref >60)
Glucose, Bld: 105 mg/dL — ABNORMAL HIGH (ref 70–99)
Potassium: 4.3 mmol/L (ref 3.5–5.1)
Sodium: 137 mmol/L (ref 135–145)
Total Bilirubin: 0.4 mg/dL (ref 0.3–1.2)
Total Protein: 6.8 g/dL (ref 6.5–8.1)

## 2020-06-17 LAB — PROTIME-INR
INR: 0.9 (ref 0.8–1.2)
Prothrombin Time: 11.8 seconds (ref 11.4–15.2)

## 2020-06-17 LAB — GLUCOSE, CAPILLARY: Glucose-Capillary: 83 mg/dL (ref 70–99)

## 2020-06-17 NOTE — Progress Notes (Addendum)
Anesthesia Review:  PCP:DR Arva Chafe- LOV 06/04/2020  Cardiologist : none  Chest x-ray :06/17/2020  EKG :06/17/2020  Echo : Stress test: Cardiac Cath :  Activity level: can do a fight of stairs without difficulty  Sleep Study/ CPAP : none  Fasting Blood Sugar :      / Checks Blood Sugar -- times a day:   Blood Thinner/ Instructions /Last Dose: ASA / Instructions/ Last Dose :  12/14-HGBA1c-5.9- epic

## 2020-06-19 ENCOUNTER — Other Ambulatory Visit (HOSPITAL_COMMUNITY): Payer: Medicare Other

## 2020-06-24 ENCOUNTER — Other Ambulatory Visit (HOSPITAL_COMMUNITY)
Admission: RE | Admit: 2020-06-24 | Discharge: 2020-06-24 | Disposition: A | Discharge status: Home / Self Care | Payer: Medicare Other | Source: Ambulatory Visit | Attending: Orthopedic Surgery | Admitting: Orthopedic Surgery

## 2020-06-24 DIAGNOSIS — Z20822 Contact with and (suspected) exposure to covid-19: Secondary | ICD-10-CM | POA: Diagnosis not present

## 2020-06-24 DIAGNOSIS — Z01818 Encounter for other preprocedural examination: Secondary | ICD-10-CM | POA: Insufficient documentation

## 2020-06-25 LAB — SARS CORONAVIRUS 2 (TAT 6-24 HRS): SARS Coronavirus 2: NEGATIVE

## 2020-06-27 ENCOUNTER — Ambulatory Visit (HOSPITAL_COMMUNITY): Payer: Medicare Other

## 2020-06-27 ENCOUNTER — Encounter (HOSPITAL_COMMUNITY)
Admission: RE | Disposition: A | Discharge status: Home / Self Care | Payer: Self-pay | Source: Home / Self Care | Attending: Orthopedic Surgery

## 2020-06-27 ENCOUNTER — Ambulatory Visit (HOSPITAL_COMMUNITY): Payer: Medicare Other | Admitting: Physician Assistant

## 2020-06-27 ENCOUNTER — Ambulatory Visit (HOSPITAL_COMMUNITY)
Admission: RE | Admit: 2020-06-27 | Discharge: 2020-06-27 | Disposition: A | Discharge status: Home / Self Care | Payer: Medicare Other | Attending: Orthopedic Surgery | Admitting: Orthopedic Surgery

## 2020-06-27 ENCOUNTER — Ambulatory Visit (HOSPITAL_COMMUNITY): Payer: Medicare Other | Admitting: Anesthesiology

## 2020-06-27 DIAGNOSIS — Z96642 Presence of left artificial hip joint: Secondary | ICD-10-CM | POA: Insufficient documentation

## 2020-06-27 DIAGNOSIS — Z7984 Long term (current) use of oral hypoglycemic drugs: Secondary | ICD-10-CM | POA: Diagnosis not present

## 2020-06-27 DIAGNOSIS — M19011 Primary osteoarthritis, right shoulder: Secondary | ICD-10-CM | POA: Insufficient documentation

## 2020-06-27 DIAGNOSIS — Z96652 Presence of left artificial knee joint: Secondary | ICD-10-CM | POA: Insufficient documentation

## 2020-06-27 DIAGNOSIS — F418 Other specified anxiety disorders: Secondary | ICD-10-CM | POA: Diagnosis not present

## 2020-06-27 DIAGNOSIS — Z880 Allergy status to penicillin: Secondary | ICD-10-CM | POA: Diagnosis not present

## 2020-06-27 DIAGNOSIS — Z807 Family history of other malignant neoplasms of lymphoid, hematopoietic and related tissues: Secondary | ICD-10-CM | POA: Diagnosis not present

## 2020-06-27 DIAGNOSIS — Z79899 Other long term (current) drug therapy: Secondary | ICD-10-CM | POA: Diagnosis not present

## 2020-06-27 DIAGNOSIS — G8918 Other acute postprocedural pain: Secondary | ICD-10-CM | POA: Diagnosis not present

## 2020-06-27 DIAGNOSIS — Z96611 Presence of right artificial shoulder joint: Secondary | ICD-10-CM | POA: Diagnosis not present

## 2020-06-27 DIAGNOSIS — Z853 Personal history of malignant neoplasm of breast: Secondary | ICD-10-CM | POA: Insufficient documentation

## 2020-06-27 DIAGNOSIS — Z809 Family history of malignant neoplasm, unspecified: Secondary | ICD-10-CM | POA: Diagnosis not present

## 2020-06-27 DIAGNOSIS — E7800 Pure hypercholesterolemia, unspecified: Secondary | ICD-10-CM | POA: Diagnosis not present

## 2020-06-27 DIAGNOSIS — Z471 Aftercare following joint replacement surgery: Secondary | ICD-10-CM | POA: Diagnosis not present

## 2020-06-27 HISTORY — PX: TOTAL SHOULDER ARTHROPLASTY: SHX126

## 2020-06-27 LAB — TYPE AND SCREEN
ABO/RH(D): AB POS
Antibody Screen: NEGATIVE

## 2020-06-27 LAB — GLUCOSE, CAPILLARY
Glucose-Capillary: 81 mg/dL (ref 70–99)
Glucose-Capillary: 87 mg/dL (ref 70–99)

## 2020-06-27 SURGERY — ARTHROPLASTY, SHOULDER, TOTAL
Anesthesia: General | Site: Shoulder | Laterality: Right

## 2020-06-27 MED ORDER — TRANEXAMIC ACID-NACL 1000-0.7 MG/100ML-% IV SOLN
1000.0000 mg | INTRAVENOUS | Status: AC
  Administered 2020-06-27: 1000 mg via INTRAVENOUS
  Filled 2020-06-27: qty 100

## 2020-06-27 MED ORDER — VANCOMYCIN HCL IN DEXTROSE 1-5 GM/200ML-% IV SOLN
1000.0000 mg | INTRAVENOUS | Status: AC
  Administered 2020-06-27: 1000 mg via INTRAVENOUS
  Filled 2020-06-27: qty 200

## 2020-06-27 MED ORDER — MIDAZOLAM HCL 5 MG/5ML IJ SOLN
INTRAMUSCULAR | Status: DC | PRN
  Administered 2020-06-27 (×2): 1 mg via INTRAVENOUS

## 2020-06-27 MED ORDER — EPHEDRINE 5 MG/ML INJ
INTRAVENOUS | Status: AC
  Filled 2020-06-27: qty 10

## 2020-06-27 MED ORDER — WATER FOR IRRIGATION, STERILE IR SOLN
Status: DC | PRN
  Administered 2020-06-27: 2000 mL

## 2020-06-27 MED ORDER — SUGAMMADEX SODIUM 500 MG/5ML IV SOLN
INTRAVENOUS | Status: DC | PRN
  Administered 2020-06-27: 200 mg via INTRAVENOUS

## 2020-06-27 MED ORDER — OXYCODONE HCL 5 MG PO TABS: 5.0000 mg | ORAL_TABLET | Freq: Once | ORAL | Status: DC | PRN

## 2020-06-27 MED ORDER — ROCURONIUM BROMIDE 10 MG/ML (PF) SYRINGE
PREFILLED_SYRINGE | INTRAVENOUS | Status: AC
  Filled 2020-06-27: qty 10

## 2020-06-27 MED ORDER — MIDAZOLAM HCL 2 MG/2ML IJ SOLN
INTRAMUSCULAR | Status: AC
  Filled 2020-06-27: qty 2

## 2020-06-27 MED ORDER — LIDOCAINE HCL (CARDIAC) PF 100 MG/5ML IV SOSY
PREFILLED_SYRINGE | INTRAVENOUS | Status: DC | PRN
  Administered 2020-06-27: 100 mg via INTRAVENOUS

## 2020-06-27 MED ORDER — ACETAMINOPHEN 10 MG/ML IV SOLN: 1000.0000 mg | Freq: Once | INTRAVENOUS | Status: DC | PRN

## 2020-06-27 MED ORDER — 0.9 % SODIUM CHLORIDE (POUR BTL) OPTIME
TOPICAL | Status: DC | PRN
  Administered 2020-06-27: 1000 mL

## 2020-06-27 MED ORDER — DEXAMETHASONE SODIUM PHOSPHATE 10 MG/ML IJ SOLN
INTRAMUSCULAR | Status: DC | PRN
  Administered 2020-06-27: 10 mg via INTRAVENOUS

## 2020-06-27 MED ORDER — ONDANSETRON HCL 4 MG/2ML IJ SOLN
4.0000 mg | Freq: Once | INTRAMUSCULAR | Status: AC | PRN
  Administered 2020-06-27: 4 mg via INTRAVENOUS

## 2020-06-27 MED ORDER — DEXAMETHASONE SODIUM PHOSPHATE 10 MG/ML IJ SOLN
INTRAMUSCULAR | Status: AC
  Filled 2020-06-27: qty 1

## 2020-06-27 MED ORDER — BUPIVACAINE HCL (PF) 0.5 % IJ SOLN
INTRAMUSCULAR | Status: DC | PRN
  Administered 2020-06-27: 15 mL via PERINEURAL

## 2020-06-27 MED ORDER — ROCURONIUM BROMIDE 100 MG/10ML IV SOLN
INTRAVENOUS | Status: DC | PRN
  Administered 2020-06-27: 70 mg via INTRAVENOUS

## 2020-06-27 MED ORDER — BUPIVACAINE LIPOSOME 1.3 % IJ SUSP
INTRAMUSCULAR | Status: DC | PRN
  Administered 2020-06-27: 10 mL via PERINEURAL

## 2020-06-27 MED ORDER — FENTANYL CITRATE (PF) 100 MCG/2ML IJ SOLN
INTRAMUSCULAR | Status: DC | PRN
  Administered 2020-06-27 (×2): 50 ug via INTRAVENOUS

## 2020-06-27 MED ORDER — SODIUM CHLORIDE 0.9 % IR SOLN
Status: DC | PRN
  Administered 2020-06-27: 1000 mL

## 2020-06-27 MED ORDER — PROPOFOL 10 MG/ML IV BOLUS
INTRAVENOUS | Status: DC | PRN
  Administered 2020-06-27: 150 mg via INTRAVENOUS

## 2020-06-27 MED ORDER — FENTANYL CITRATE (PF) 100 MCG/2ML IJ SOLN
INTRAMUSCULAR | Status: AC
  Filled 2020-06-27: qty 2

## 2020-06-27 MED ORDER — ONDANSETRON HCL 4 MG/2ML IJ SOLN
INTRAMUSCULAR | Status: AC
  Filled 2020-06-27: qty 2

## 2020-06-27 MED ORDER — ONDANSETRON HCL 4 MG/2ML IJ SOLN
INTRAMUSCULAR | Status: DC | PRN
  Administered 2020-06-27: 4 mg via INTRAVENOUS

## 2020-06-27 MED ORDER — LACTATED RINGERS IV SOLN: INTRAVENOUS | Status: DC

## 2020-06-27 MED ORDER — OXYCODONE HCL 5 MG/5ML PO SOLN: 5.0000 mg | Freq: Once | ORAL | Status: DC | PRN

## 2020-06-27 MED ORDER — LACTATED RINGERS IV BOLUS
250.0000 mL | Freq: Once | INTRAVENOUS | Status: AC
  Administered 2020-06-27: 250 mL via INTRAVENOUS

## 2020-06-27 MED ORDER — LIDOCAINE HCL (PF) 2 % IJ SOLN
INTRAMUSCULAR | Status: AC
  Filled 2020-06-27: qty 5

## 2020-06-27 MED ORDER — HYDROMORPHONE HCL 1 MG/ML IJ SOLN
INTRAMUSCULAR | Status: AC
  Administered 2020-06-27: 0.25 mg via INTRAVENOUS
  Filled 2020-06-27: qty 1

## 2020-06-27 MED ORDER — PROPOFOL 10 MG/ML IV BOLUS
INTRAVENOUS | Status: AC
  Filled 2020-06-27: qty 20

## 2020-06-27 MED ORDER — EPHEDRINE SULFATE 50 MG/ML IJ SOLN
INTRAMUSCULAR | Status: DC | PRN
  Administered 2020-06-27 (×4): 10 mg via INTRAVENOUS

## 2020-06-27 MED ORDER — TIZANIDINE HCL 4 MG PO TABS: 4.0000 mg | ORAL_TABLET | Freq: Three times a day (TID) | ORAL | 1 refills | Status: DC | PRN

## 2020-06-27 MED ORDER — HYDROMORPHONE HCL 1 MG/ML IJ SOLN
0.2500 mg | INTRAMUSCULAR | Status: DC | PRN
  Administered 2020-06-27 (×3): 0.25 mg via INTRAVENOUS

## 2020-06-27 MED ORDER — OXYCODONE-ACETAMINOPHEN 5-325 MG PO TABS: ORAL_TABLET | ORAL | 0 refills | Status: DC

## 2020-06-27 SURGICAL SUPPLY — 66 items
AID PSTN UNV HD RSTRNT DISP (MISCELLANEOUS) ×1
BAG SPEC THK2 15X12 ZIP CLS (MISCELLANEOUS) ×1
BAG ZIPLOCK 12X15 (MISCELLANEOUS) ×2 IMPLANT
BIT DRILL 1.6MX128 (BIT) ×2 IMPLANT
BLADE SAW SAG 73X25 THK (BLADE) ×1
BLADE SAW SGTL 73X25 THK (BLADE) ×1 IMPLANT
CEMENT BONE DEPUY (Cement) ×2 IMPLANT
COOLER ICEMAN CLASSIC (MISCELLANEOUS) IMPLANT
COVER BACK TABLE 60X90IN (DRAPES) ×2 IMPLANT
COVER SURGICAL LIGHT HANDLE (MISCELLANEOUS) ×2 IMPLANT
COVER WAND RF STERILE (DRAPES) IMPLANT
DRAPE INCISE IOBAN 66X45 STRL (DRAPES) ×2 IMPLANT
DRAPE ORTHO SPLIT 77X108 STRL (DRAPES) ×4
DRAPE POUCH INSTRU U-SHP 10X18 (DRAPES) ×2 IMPLANT
DRAPE SURG 17X11 SM STRL (DRAPES) ×2 IMPLANT
DRAPE SURG ORHT 6 SPLT 77X108 (DRAPES) ×2 IMPLANT
DRAPE TOP 10253 STERILE (DRAPES) ×2 IMPLANT
DRAPE U-SHAPE 47X51 STRL (DRAPES) ×2 IMPLANT
DRSG AQUACEL AG ADV 3.5X 6 (GAUZE/BANDAGES/DRESSINGS) ×2 IMPLANT
DURAPREP 26ML APPLICATOR (WOUND CARE) ×4 IMPLANT
ELECT BLADE TIP CTD 4 INCH (ELECTRODE) ×2 IMPLANT
ELECT REM PT RETURN 15FT ADLT (MISCELLANEOUS) ×2 IMPLANT
GLENOID PEG SHOULDER 40MM SML (Shoulder) IMPLANT
GLOVE BIO SURGEON STRL SZ7.5 (GLOVE) ×2 IMPLANT
GLOVE SRG 8 PF TXTR STRL LF DI (GLOVE) ×1 IMPLANT
GLOVE SURG ENC MOIS LTX SZ7 (GLOVE) ×2 IMPLANT
GLOVE SURG UNDER POLY LF SZ7 (GLOVE) ×2 IMPLANT
GLOVE SURG UNDER POLY LF SZ8 (GLOVE) ×2
GOWN STRL REUS W/TWL LRG LVL3 (GOWN DISPOSABLE) ×2 IMPLANT
GOWN STRL REUS W/TWL XL LVL3 (GOWN DISPOSABLE) ×2 IMPLANT
GUIDEWIRE GLENOID 2.5X220 (WIRE) ×1 IMPLANT
HANDPIECE INTERPULSE COAX TIP (DISPOSABLE) ×2
HEAD HUMERAL 43X16 COBALT (Miscellaneous) ×1 IMPLANT
HEMOSTAT SURGICEL 2X14 (HEMOSTASIS) ×2 IMPLANT
HOOD PEEL AWAY FLYTE STAYCOOL (MISCELLANEOUS) ×6 IMPLANT
KIT BASIN OR (CUSTOM PROCEDURE TRAY) ×2 IMPLANT
KIT TURNOVER KIT A (KITS) IMPLANT
MANIFOLD NEPTUNE II (INSTRUMENTS) ×2 IMPLANT
NDL TROCAR POINT SZ 2 1/2 (NEEDLE) ×1 IMPLANT
NEEDLE TROCAR POINT SZ 2 1/2 (NEEDLE) ×2 IMPLANT
NS IRRIG 1000ML POUR BTL (IV SOLUTION) ×2 IMPLANT
PACK SHOULDER (CUSTOM PROCEDURE TRAY) ×2 IMPLANT
PAD COLD SHLDR WRAP-ON (PAD) IMPLANT
PROTECTOR NERVE ULNAR (MISCELLANEOUS) IMPLANT
RESTRAINT HEAD UNIVERSAL NS (MISCELLANEOUS) ×2 IMPLANT
RETRIEVER SUT HEWSON (MISCELLANEOUS) ×2 IMPLANT
SET HNDPC FAN SPRY TIP SCT (DISPOSABLE) ×1 IMPLANT
SHOULDER GLENOID PEG 40MM SML (Shoulder) ×2 IMPLANT
SLING ARM IMMOBILIZER LRG (SOFTGOODS) IMPLANT
SLING ARM IMMOBILIZER MED (SOFTGOODS) ×1 IMPLANT
SMARTMIX MINI TOWER (MISCELLANEOUS) ×2
SPONGE LAP 18X18 RF (DISPOSABLE) ×2 IMPLANT
STEM HUMERAL PTC SZ1C 66 1275D (Stem) ×1 IMPLANT
STRIP CLOSURE SKIN 1/2X4 (GAUZE/BANDAGES/DRESSINGS) ×2 IMPLANT
SUCTION FRAZIER HANDLE 12FR (TUBING) ×2
SUCTION TUBE FRAZIER 12FR DISP (TUBING) ×1 IMPLANT
SUPPORT WRAP ARM LG (MISCELLANEOUS) ×2 IMPLANT
SUT ETHIBOND 2 V 37 (SUTURE) ×2 IMPLANT
SUT MNCRL AB 4-0 PS2 18 (SUTURE) ×2 IMPLANT
SUT VIC AB 2-0 CT1 27 (SUTURE) ×4
SUT VIC AB 2-0 CT1 TAPERPNT 27 (SUTURE) ×1 IMPLANT
TAPE LABRALWHITE 1.5X36 (TAPE) ×2 IMPLANT
TAPE SUT LABRALTAP WHT/BLK (SUTURE) ×2 IMPLANT
TOWEL OR 17X26 10 PK STRL BLUE (TOWEL DISPOSABLE) ×2 IMPLANT
TOWER SMARTMIX MINI (MISCELLANEOUS) ×1 IMPLANT
WATER STERILE IRR 1000ML POUR (IV SOLUTION) ×2 IMPLANT

## 2020-06-27 NOTE — Anesthesia Procedure Notes (Signed)
Procedure Name: Intubation Date/Time: 06/27/2020 7:42 AM Performed by: Jonna Munro, CRNA Pre-anesthesia Checklist: Patient identified, Emergency Drugs available, Suction available, Patient being monitored and Timeout performed Patient Re-evaluated:Patient Re-evaluated prior to induction Oxygen Delivery Method: Circle system utilized Preoxygenation: Pre-oxygenation with 100% oxygen Induction Type: IV induction Ventilation: Mask ventilation without difficulty Laryngoscope Size: Mac and 4 Grade View: Grade I Tube type: Oral Tube size: 7.0 mm Number of attempts: 1 Airway Equipment and Method: Stylet Placement Confirmation: ETT inserted through vocal cords under direct vision,  positive ETCO2 and breath sounds checked- equal and bilateral Secured at: 22 cm Tube secured with: Tape Dental Injury: Teeth and Oropharynx as per pre-operative assessment

## 2020-06-27 NOTE — H&P (Signed)
Alisha Matthews is an 70 y.o. female.   Chief Complaint: R shoulder pain and dysfunction HPI: Endstage R shoulder arthritis with significant pain and dysfunction, failed conservative measures.  Pain interferes with sleep and quality of life.   Past Medical History:  Diagnosis Date  . Anemia, unspecified   . Anxiety   . Arthritis   . Asthma    mild exercise induced asthma as a child   . Barrett's esophagus   . Breast cancer ([place])    right breast invasive ductal carcinoma   . Breast fibrocystic disorder   . Bronchitis   . Chicken pox   . Constipation, chronic   . Depressive disorder, not elsewhere classified   . Diabetes mellitus    borderline  . Exercise induced bronchospasm   . Genital warts   . GERD (gastroesophageal reflux disease)   . Hyperlipidemia   . Infected postoperative breast seroma   . Insomnia, unspecified   . Migraine, unspecified, without mention of intractable migraine without mention of status migrainosus   . Obesity, unspecified   . Osteoarthrosis, unspecified whether generalized or localized, ankle and foot    ankle/foot  . Personal history of colonic polyps   . Symptomatic menopausal or female climacteric states     Past Surgical History:  Procedure Laterality Date  . BREAST LUMPECTOMY WITH NEEDLE LOCALIZATION AND AXILLARY SENTINEL LYMPH NODE BX  06/08/2012   Procedure: BREAST LUMPECTOMY WITH NEEDLE LOCALIZATION AND AXILLARY SENTINEL LYMPH NODE BX;  Surgeon: Rolm Bookbinder, MD;  Location: [facility];  Service: General;  Laterality: Right;  . BREAST SURGERY    . BUNIONECTOMY     3 surgeries on both feet  . CHOLECYSTECTOMY    . COLONOSCOPY    . EYE SURGERY     Orbital right eye surgery  . FOOT SURGERY     Left-revision  . JOINT REPLACEMENT    . NASAL SEPTUM SURGERY    . RE-EXCISION OF BREAST CANCER,SUPERIOR MARGINS  06/27/2012   Procedure: RE-EXCISION OF BREAST CANCER,SUPERIOR MARGINS;  Surgeon: Rolm Bookbinder, MD;  Location: [facility];  Service: General;  Laterality: Right;  . TOTAL HIP ARTHROPLASTY     left      Perthes disease  . TOTAL KNEE ARTHROPLASTY Left 02/20/2014   Procedure: TOTAL KNEE ARTHROPLASTY;  Surgeon: Hessie Dibble, MD;  Location: [facility];  Service: Orthopedics;  Laterality: Left;    Family History  Adopted: Yes  Problem Relation Age of Onset  . Hypertension Mother   . Diabetes Mother   . Heart failure Mother   . Heart disease Mother   . Hyperlipidemia Mother   . Stroke Mother   . Mental illness Mother   . Cancer Maternal Grandmother 40       non hodgkins lymphoma  . Cancer Maternal Grandfather   . Deep vein thrombosis Brother    Social History:  reports that she has never smoked. She has never used smokeless tobacco. She reports that she does not drink alcohol and does not use drugs.  Allergies:  Allergies  Allergen Reactions  . Penicillins Rash    Medications Prior to Admission  Medication Sig Dispense Refill  . ALPRAZolam (XANAX) 0.25 MG tablet TAKE 1 TABLET BY MOUTH AT BEDTIME AS NEEDED FOR ANXIETY (Patient taking differently: Take 0.25 mg by mouth daily as needed for anxiety.) 30 tablet 5  . atorvastatin (LIPITOR) 20 MG tablet Take 1 tablet (20 mg total) by mouth daily. 90 tablet 3  .  Calcium-Magnesium-Vitamin D (CALCIUM 500 PO) Take 500 mg by mouth 3 (three) times daily.    . Coenzyme Q10 (COQ10) 200 MG CAPS Take 200 mg by mouth daily.    . diclofenac (VOLTAREN) 75 MG EC tablet Take 1 tablet (75 mg total) by mouth 2 (two) times daily. 180 tablet 3  . Diindolylmethane POWD 1 Package by Does not apply route daily. Reported on 09/10/2015    . esomeprazole (NEXIUM) 40 MG capsule Take 1 capsule (40 mg total) by mouth daily. 90 capsule 3  . HYDROcodone-acetaminophen (NORCO/VICODIN) 5-325 MG tablet Take 1 tablet by mouth every 6 (six) hours as needed for moderate pain. 30 tablet 0  . LevOCARNitine (CARNITINE PO) Take 3,000 mg by mouth daily.     . metFORMIN (GLUCOPHAGE-XR)  500 MG 24 hr tablet Take 1 tablet (500 mg total) by mouth 2 (two) times daily. 180 tablet 3  . misoprostol (CYTOTEC) 200 MCG tablet TAKE 1 TABLET BY MOUTH 2 TIMES DAILY. 180 tablet 2  . Multiple Vitamins-Minerals (ANTIOXIDANT PO) Take 1 capsule by mouth daily. Fruit    . OVER THE COUNTER MEDICATION Take 1 capsule by mouth daily. Procast package: Elite-100 omega 3 vitamin    . OVER THE COUNTER MEDICATION Take 1 capsule by mouth daily. Procast: Memory,brain and liver vitamin    . OVER THE COUNTER MEDICATION Take 1 capsule by mouth daily. Circulation and vein support vitamin    . PARoxetine (PAXIL) 20 MG tablet Take 1 tablet (20 mg total) by mouth daily. 90 tablet 0  . Probiotic Product (PROBIOTIC DAILY PO) Take 1 capsule by mouth daily.    . traZODone (DESYREL) 50 MG tablet Take 0.5-1 tablets (25-50 mg total) by mouth at bedtime as needed for sleep. (Patient taking differently: Take 50 mg by mouth at bedtime.) 30 tablet 3  . clindamycin (CLEOCIN) 150 MG capsule Take 600 mg by mouth See admin instructions. Dental procedures    . diphenhydrAMINE (BENADRYL) 25 MG tablet Take 25 mg by mouth at bedtime as needed for sleep. When not taking xanax.    . frovatriptan (FROVA) 2.5 MG tablet Take 1 tablet (2.5 mg total) by mouth as needed for migraine (max 3 tabs in 24 hours). If recurs, may repeat after 2 hours. Max of 3 tabs in 24 hours. 10 tablet 1  . methocarbamol (ROBAXIN) 500 MG tablet Take 1 tab every 6-8 hours as needed for muscle tension. (Patient not taking: Reported on 06/10/2020) 60 tablet 0    Results for orders placed or performed during the hospital encounter of 06/27/20 (from the past 48 hour(s))  Glucose, capillary     Status: None   Collection Time: 06/27/20  5:53 AM  Result Value Ref Range   Glucose-Capillary 81 70 - 99 mg/dL    Comment: Glucose reference range applies only to samples taken after fasting for at least 8 hours.   Comment 1 Notify RN    No results found.  Review of Systems   All other systems reviewed and are negative.   Blood pressure 130/60, pulse (!) 58, temperature 98.9 F (37.2 C), temperature source Oral, resp. rate 18, height 5\' 3"  (1.6 m), weight 68 kg, last menstrual period 06/22/2000, SpO2 100 %. Physical Exam Constitutional:      Appearance: Normal appearance.  HENT:     Head: Atraumatic.  Eyes:     Extraocular Movements: Extraocular movements intact.  Cardiovascular:     Pulses: Normal pulses.  Pulmonary:     Effort: Pulmonary  effort is normal.  Musculoskeletal:     Comments: R shoulder pain with limited ROM. NVID.  Skin:    General: Skin is warm and dry.  Neurological:     Mental Status: She is alert.  Psychiatric:        Mood and Affect: Mood normal.      Assessment/Plan R shoulder endstage osteoarthritis Plan R TSA Risks / benefits of surgery discussed Consent on chart  NPO for OR Preop antibiotics   Berline Lopes, MD 06/27/2020, 7:12 AM

## 2020-06-27 NOTE — Op Note (Signed)
Procedure(s): TOTAL SHOULDER ARTHROPLASTY Procedure Note  Alisha Matthews female 70 y.o. 06/27/2020   Preoperative diagnosis: Right shoulder end-stage osteoarthritis  Postoperative diagnosis: Same  Procedure(s) and Anesthesia Type:    R TOTAL SHOULDER ARTHROPLASTY - General  Surgeon(s) and Role:    Alisha Ade, MD - Primary   Indications:  70 y.o. female  With endstage right shoulder arthritis. Pain and dysfunction interfered with quality of life and nonoperative treatment with activity modification, NSAIDS and injections failed.     Surgeon: Alisha Matthews   Assistants: Alisha Hubert PA-C [facility] was present and scrubbed throughout the procedure and was essential in positioning, retraction, exposure, and closure)  Anesthesia: General endotracheal anesthesia with preoperative interscalene block given by the attending anesthesiologist     Procedure Detail  TOTAL SHOULDER ARTHROPLASTY  Findings: Tornier flex anatomic press-fit size 1 stem with a 43 low offset head, cemented size small 40 Cortiloc glenoid.   A lesser tuberosity osteotomy was performed and repaired at the conclusion of the procedure.  Estimated Blood Loss:  200 mL         Drains: None   Blood Given: none          Specimens: none        Complications:  * No complications entered in OR log *         Disposition: PACU - hemodynamically stable.         Condition: stable    Procedure:   The patient was identified in the preoperative holding area where I personally marked the operative extremity after verifying with the patient and consent. She  was taken to the operating room where She was transferred to the   operative table.  The patient received an interscalene block in   the holding area by the attending anesthesiologist.  General anesthesia was induced   in the operating room without complication.  The patient did receive IV  Ancef prior to the commencement of the procedure.  The  patient was   placed in the beach-chair position with the back raised about 30   degrees.  The nonoperative extremity and head and neck were carefully   positioned and padded protecting against neurovascular compromise.  The   left upper extremity was then prepped and draped in the standard sterile   fashion.    The appropriate operative time-out was performed with   Anesthesia, the perioperative staff, as well as myself and we all agreed   that the right side was the correct operative site.  An approximately   10 cm incision was made from the tip of the coracoid to the center point of the   humerus at the level of the axilla.  Dissection was carried down sharply   through subcutaneous tissues and cephalic vein was identified and taken   laterally with the deltoid.  The pectoralis major was taken medially.  The   upper 1 cm of the pectoralis major was released from its attachment on   the humerus.  The clavipectoral fascia was incised just lateral to the   conjoined tendon.  This incision was carried up to but not into the   coracoacromial ligament.  Digital palpation was used to prove   integrity of the axillary nerve which was protected throughout the   procedure.  Musculocutaneous nerve was not palpated in the operative   field.  Conjoined tendon was then retracted gently medially and the   deltoid laterally.  Anterior circumflex humeral vessels  were clamped and   coagulated.  The soft tissues overlying the biceps was incised and this   incision was carried across the transverse humeral ligament to the base   of the coracoid.  The biceps was noted to be severely degenerated. It was released from the superior labrum. The biceps was then tenodesed to the soft tissue just above   pectoralis major and the remaining portion of the biceps superiorly was   excised.  An osteotomy was performed at the lesser tuberosity.  The capsule was then   released all the way down to the 6 o'clock position  of the humeral head.   The humeral head was then delivered with simultaneous adduction,   extension and external rotation.  All humeral osteophytes were removed   and the anatomic neck of the humerus was marked and cut free hand at   approximately 25 degrees retroversion within about 3 mm of the cuff   reflection posteriorly.  The head size was estimated to be a 43.  At that point, the humeral head was retracted posteriorly with   a Fukuda retractor.   Remaining portion of the capsule was released at the base of the   coracoid.  The remaining biceps anchor and the entire anterior-inferior   labrum was excised.  The posterior labrum was also excised but the   posterior capsule was not released.  The guidepin was placed bicortically with non elevated guide.  The reamer was used to ream to concentric bone with punctate bleeding.  This gave an excellent concentric surface.  The center hole was then drilled for an anchor peg glenoid followed by the three peripheral holes and none of the holes   exited the glenoid wall.  I then pulse irrigated these holes and dried   them with Surgicel.  The three peripheral holes were then   pressurized cemented and the anchor peg glenoid was placed and impacted   with an excellent fit.  The glenoid was a 40 small component.  The proximal humerus was then again exposed taking care not to displace the glenoid.    The entry awl was used followed by sounding reamers and then sequentially broached for a size 1. This was then left in place and the calcar planer was used. Trial head was placed with a 43 low offset.  With the trial implantation of the component,  there was approximately 50% posterior translation with immediate snap back to the   anatomic position.  With forward elevation, there was no tendency   towards posterior subluxation.   The trial was removed and the final implant was prepared on a back table.  The trial was removed and the final implant was prepared on  a back table.   3 small holes were drilled on the medial side of the lesser tuberosity osteotomy, through which 2 labral tapes were passed. The implant was then placed through the loop of the 2 labral tapes and impacted with an excellent press-fit. This achieved excellent anatomic reconstruction of the proximal humerus.  The joint was then copiously irrigated with pulse lavage.  The subscapularis and   lesser tuberosity osteotomy were then repaired using the 2 labral tapes previously passed in a double row fashion with horizontal mattress sutures medially brought over through bone tunnels tied over a bone bridge laterally.   One #1 Ethibond was placed at the rotator interval just above   the lesser tuberosity. Copious irrigation was used. Skin was closed with 2-0 Vicryl  sutures in the deep dermal layer and 4-0 Monocryl in a subcuticular  running fashion.  Sterile dressings were then applied including Aquacel.  The patient was placed in a sling and allowed to awaken from general anesthesia and taken to the recovery room in stable condition.      POSTOPERATIVE PLAN:  Early passive range of motion will be allowed with the goal of 0 degrees external rotation and 90 degrees forward elevation.  No internal rotation at this time.  No active motion of the arm until the lesser tuberosity heals.  The patient will be observed in the recovery room and likely discharge home today with family as long as she has good pain control and is hemodynamically stable.

## 2020-06-27 NOTE — Transfer of Care (Signed)
Immediate Anesthesia Transfer of Care Note  Patient: Alisha Matthews  Procedure(s) Performed: TOTAL SHOULDER ARTHROPLASTY (Right Shoulder)  Patient Location: PACU  Anesthesia Type:General  Level of Consciousness: awake, alert , oriented and patient cooperative  Airway & Oxygen Therapy: Patient Spontanous Breathing and Patient connected to face mask oxygen  Post-op Assessment: Report given to RN and Post -op Vital signs reviewed and stable  Post vital signs: Reviewed and stable  Last Vitals:  Vitals Value Taken Time  BP    Temp    Pulse    Resp    SpO2      Last Pain:  Vitals:   06/27/20 0540  TempSrc: Oral         Complications: No complications documented.

## 2020-06-27 NOTE — Evaluation (Signed)
Occupational Therapy Evaluation Patient Details Name: Alisha Matthews MRN: [id number] DOB: [birth year] Today's Date: 06/27/2020    History of Present Illness 70 year old female s/p RT Total shoulder arthoplasty.   Clinical Impression   Pt is a 70 year old female, s/p shoulder replacement without functional use of RT dominant upper extremity secondary to effects of surgery and interscalene block and shoulder precautions. Therapist provided education and instruction to patient and spouse in regards to exercises, precautions, positioning, donning upper extremity clothing and bathing while maintaining shoulder precautions, ice and edema management and donning/doffing sling. Patient and spouse verbalized understanding and demonstrated as needed. Patient needed assistance to donn shirt, and pants as well as sling, and provided with instruction on compensatory strategies to perform ADLs. Patient limited by decreased ROM in RT shoulder so therefore will need some form of assistance at home which spouse can provide 24/7. Patient and spouse verbalized and/or demonstrated understanding to all instruction. Patient to follow up with MD for further therapy needs.     Follow Up Recommendations  Follow surgeon's recommendation for DC plan and follow-up therapies    Equipment Recommendations   (None)    Recommendations for Other Services       Precautions / Restrictions Precautions Precautions: Shoulder Type of Shoulder Precautions: PROM of shoulder limited to 90FF and 0ER, sling edu, hand wrist elbow ROM only Shoulder Interventions: Off for dressing/bathing/exercises;Shoulder sling/immobilizer Precaution Booklet Issued: Yes (comment) Required Braces or Orthoses: Sling Restrictions Weight Bearing Restrictions: Yes RUE Weight Bearing: Non weight bearing      Mobility Bed Mobility               General bed mobility comments: Pt in PACU recliner    Transfers Overall transfer level:  Independent Equipment used: None                  Balance Overall balance assessment: Independent                                         ADL either performed or assessed with clinical judgement   ADL Overall ADL's : Needs assistance/impaired Eating/Feeding: Set up;Sitting   Grooming: Set up;Standing   Upper Body Bathing: Minimal assistance;Sitting;Standing   Lower Body Bathing: Supervison/ safety   Upper Body Dressing : Moderate assistance;Sitting;Standing   Lower Body Dressing: Minimal assistance;Sit to/from stand   Toilet Transfer: Bryant and Hygiene: Minimal assistance       Functional mobility during ADLs: Modified independent;Independent       Vision   Vision Assessment?: No apparent visual deficits     Perception     Praxis      Pertinent Vitals/Pain Pain Assessment: 0-10 Pain Score: 2  Pain Location: RT shoulder, but mostly RUE is numb Pain Intervention(s): Limited activity within patient's tolerance;Monitored during session     Hand Dominance Right   Extremity/Trunk Assessment Upper Extremity Assessment Upper Extremity Assessment: RUE deficits/detail RUE: Unable to fully assess due to immobilization RUE Sensation: decreased light touch (due to nerve block)       Cervical / Trunk Assessment Cervical / Trunk Assessment: Normal   Communication Communication Communication: No difficulties   Cognition Arousal/Alertness: Awake/alert Behavior During Therapy: WFL for tasks assessed/performed Overall Cognitive Status: Within Functional Limits for tasks assessed  General Comments       Exercises Shoulder Exercises Shoulder Flexion: PROM;5 reps;Right;Limitations (90 degree max) Shoulder External Rotation: PROM;Seated;Limitations;5 reps Shoulder External Rotation Limitations: 0 degrees/neutral in ER low Elbow Flexion:  PROM;Right Elbow Extension: PROM;Right;Standing Wrist Flexion: PROM;5 reps;Right Wrist Extension: PROM;5 reps;Right Digit Composite Flexion: PROM Composite Extension: PROM Neck Flexion: AROM;5 reps Neck Extension: 5 reps Neck Lateral Flexion - Right: AROM;5 reps Neck Lateral Flexion - Left: AROM;5 reps Hand Exercises Forearm Supination: PROM;Right Forearm Pronation: PROM;Right   Shoulder Instructions Shoulder Instructions Donning/doffing shirt without moving shoulder: Moderate assistance;Caregiver independent with task;Patient able to independently direct caregiver Method for sponge bathing under operated UE: Caregiver independent with task;Patient able to independently direct caregiver;Minimal assistance Donning/doffing sling/immobilizer: Moderate assistance;Patient able to independently direct caregiver;Caregiver independent with task Correct positioning of sling/immobilizer: Caregiver independent with task;Patient able to independently direct caregiver;Moderate assistance ROM for elbow, wrist and digits of operated UE: Maximal assistance;Caregiver independent with task;Patient able to independently direct caregiver (RUE remains numb) Sling wearing schedule (on at all times/off for ADL's): Caregiver independent with task;Patient able to independently direct caregiver;Independent Proper positioning of operated UE when showering: Supervision/safety;Caregiver independent with task;Patient able to independently direct caregiver Dressing change: Caregiver independent with task Positioning of UE while sleeping: Caregiver independent with task;Patient able to independently direct caregiver;Independent    Home Living Family/patient expects to be discharged to:: Private residence Living Arrangements: Spouse/significant other Available Help at Discharge: Available 24 hours/day (Pt's spouse is a retired Sales executive PA) Type of Home: House             Bathroom Shower/Tub: Tub/shower unit          Home Equipment: Grab bars - tub/shower          Prior Functioning/Environment Level of Independence: Independent                 OT Problem List: Decreased range of motion;Impaired UE functional use      OT Treatment/Interventions:      OT Goals(Current goals can be found in the care plan section) Acute Rehab OT Goals Patient Stated Goal: To be able to wear overhead nightgowns with sling on top. OT Goal Formulation: With patient/family Time For Goal Achievement: 06/27/20 Potential to Achieve Goals: Good  OT Frequency:     Barriers to D/C:            Co-evaluation              AM-PAC OT "6 Clicks" Daily Activity     Outcome Measure Help from another person eating meals?: A Little Help from another person taking care of personal grooming?: A Little Help from another person toileting, which includes using toliet, bedpan, or urinal?: A Little Help from another person bathing (including washing, rinsing, drying)?: A Little Help from another person to put on and taking off regular upper body clothing?: A Lot Help from another person to put on and taking off regular lower body clothing?: A Little 6 Click Score: 17   End of Session Equipment Utilized During Treatment:  (Sling) Nurse Communication:  (OT education completed)  Activity Tolerance: Patient tolerated treatment well Patient left: in chair;with nursing/sitter in room;with family/visitor present  OT Visit Diagnosis:  (Decreased ADLs)                Time: 7371-0626 OT Time Calculation (min): 51 min Charges:  OT General Charges $OT Visit: 1 Visit OT Evaluation $OT Eval Low Complexity: 1 Low OT Treatments $Self Care/Home Management :  23-37 mins  Anderson [place], [place] Acute Rehab Services Office: [phone] 06/27/2020  Julien Girt 06/27/2020, 1:09 PM

## 2020-06-27 NOTE — Anesthesia Procedure Notes (Signed)
Anesthesia Regional Block: Interscalene brachial plexus block   Pre-Anesthetic Checklist: ,, timeout performed, Correct Patient, Correct Site, Correct Laterality, Correct Procedure, Correct Position, site marked, Risks and benefits discussed,  Surgical consent,  Pre-op evaluation,  At surgeon's request and post-op pain management  Laterality: Right  Prep: chloraprep       Needles:  Injection technique: Single-shot  Needle Type: Echogenic Needle     Needle Length: 9cm      Additional Needles:   Procedures:,,,, ultrasound used (permanent image in chart),,,,  Narrative:  Start time: 06/27/2020 7:11 AM End time: 06/27/2020 7:23 AM Injection made incrementally with aspirations every 5 mL.  Performed by: Personally  Anesthesiologist: Eilene Ghazi, MD  Additional Notes: Patient tolerated the procedure well without complications

## 2020-06-27 NOTE — Anesthesia Postprocedure Evaluation (Signed)
Anesthesia Post Note  Patient: Alisha Matthews  Procedure(s) Performed: TOTAL SHOULDER ARTHROPLASTY (Right Shoulder)     Patient location during evaluation: PACU Anesthesia Type: General Level of consciousness: awake and alert Pain management: pain level controlled Vital Signs Assessment: post-procedure vital signs reviewed and stable Respiratory status: spontaneous breathing, nonlabored ventilation, respiratory function stable and patient connected to nasal cannula oxygen Cardiovascular status: blood pressure returned to baseline and stable Postop Assessment: no apparent nausea or vomiting Anesthetic complications: no   No complications documented.  Last Vitals:  Vitals:   06/27/20 1000 06/27/20 1015  BP:  (!) 128/59  Pulse:  73  Resp:  18  Temp:  (!) 36.3 C  SpO2: 92% 94%    Last Pain:  Vitals:   06/27/20 1015  TempSrc:   PainSc: 0-No pain                 Noele Icenhour S

## 2020-06-27 NOTE — Discharge Instructions (Signed)
Discharge Instructions after Total Shoulder Arthroplasty   A sling has been provided for you. Remove the sling 5 times each day to perform motion exercises. After the first 48 to 72 hours, discontinue using the sling. You should use the sling as a protective device, if you are in a crowd.  Use ice on the shoulder intermittently over the first 48 hours after surgery.  Pain medication has been prescribed for you.  Use your medication liberally over the first 48 hours, and then begin to taper your use. You may take Extra Strength Tylenol or Tylenol only in place of the pain pills. DO NOT take ANY nonsteroidal anti-inflammatory pain medications: Advil, Motrin, Ibuprofen, Aleve, Naproxen, or Naprosyn. Take one aspirin a day for 2 weeks after surgery, unless you have an aspirin sensitivity/allergy or asthma. Leave your dressing on until your first follow up visit.  You may shower with the dressing.  Hold your arm as if you still have your sling on while you shower. Active reaching and lifting are not permitted. You may use the operative arm for activities of daily living that do not require the operative arm to leave the side of the body, such as eating, drinking, bathing, etc.  Three to 5 times each day you should perform assisted overhead reaching and external rotation (outward turning) exercises with the operative arm. You were taught these exercises prior to discharge. Both exercises should be done with the non-operative arm used as the "therapist arm" while the operative arm remains relaxed. Ten of each exercise should be done three to five times each day.   Overhead reach is helping to lift your stiff arm up as high as it will go. To stretch your overhead reach, lie flat on your back, relax, and grasp the wrist of the tight shoulder with your opposite hand. Using the power in your opposite arm, bring the stiff arm up as far as it is comfortable. Start holding it for ten seconds and then work up to where  you can hold it for a count of 30. Breathe slowly and deeply while the arm is moved. Repeat this stretch ten times, trying to help the ar up a little higher each time.     External rotation is turning the arm out to the side while your elbow stays close to your body. External rotation is best stretched while you are lying on your back. Hold a cane, yardstick, broom handle, or dowel in both hands. Bend both elbows to a right angle. Use steady, gentle force from your normal arm to rotate the hand of the stiff shoulder out away from your body. Continue the rotation until it is straight in front of you holding it there for a count of 10. Do not go beyond this level of rotation until seen back by Dr. Chandler. Repeat this exercise ten times slowly.      Please call [phone] during normal business hours or [phone] after hours for any problems. Including the following:  - excessive redness of the incisions - drainage for more than 4 days - fever of more than 101.5 F  *Please note that pain medications will not be refilled after hours or on weekends.   Dental Antibiotics:  In most cases prophylactic antibiotics for Dental procdeures after total joint surgery are not necessary.  Exceptions are as follows:  1. History of prior total joint infection  2. Severely immunocompromised (Organ Transplant, cancer chemotherapy, Rheumatoid biologic meds such as Humera)  3. Poorly controlled   diabetes (A1C &gt; 8.0, blood glucose over 200)  If you have one of these conditions, contact your surgeon for an antibiotic prescription, prior to your dental procedure.  

## 2020-06-27 NOTE — Anesthesia Procedure Notes (Signed)
Anesthesia Procedure Image    

## 2020-06-27 NOTE — Anesthesia Preprocedure Evaluation (Signed)
Anesthesia Evaluation  Patient identified by MRN, date of birth, ID band Patient awake    Reviewed: Allergy & Precautions, H&P , NPO status , Patient's Chart, lab work & pertinent test results  Airway Mallampati: II  TM Distance: >3 FB Neck ROM: Full    Dental no notable dental hx.    Pulmonary neg pulmonary ROS,    Pulmonary exam normal breath sounds clear to auscultation       Cardiovascular negative cardio ROS Normal cardiovascular exam Rhythm:Regular Rate:Normal     Neuro/Psych Anxiety negative neurological ROS     GI/Hepatic Neg liver ROS, GERD  ,  Endo/Other    Renal/GU negative Renal ROS  negative genitourinary   Musculoskeletal  (+) Arthritis , Osteoarthritis,    Abdominal   Peds negative pediatric ROS (+)  Hematology negative hematology ROS (+)   Anesthesia Other Findings   Reproductive/Obstetrics negative OB ROS                             Anesthesia Physical Anesthesia Plan  ASA: II  Anesthesia Plan: General   Post-op Pain Management:  Regional for Post-op pain   Induction: Intravenous  PONV Risk Score and Plan: 3 and Ondansetron, Dexamethasone, Treatment may vary due to age or medical condition and Midazolam  Airway Management Planned: Oral ETT  Additional Equipment:   Intra-op Plan:   Post-operative Plan: Extubation in OR  Informed Consent: I have reviewed the patients History and Physical, chart, labs and discussed the procedure including the risks, benefits and alternatives for the proposed anesthesia with the patient or authorized representative who has indicated his/her understanding and acceptance.     Dental advisory given  Plan Discussed with: CRNA and Surgeon  Anesthesia Plan Comments:         Anesthesia Quick Evaluation

## 2020-06-28 ENCOUNTER — Encounter (HOSPITAL_COMMUNITY): Payer: Self-pay | Admitting: Orthopedic Surgery

## 2020-07-01 ENCOUNTER — Other Ambulatory Visit: Payer: Self-pay | Admitting: Family Medicine

## 2020-07-01 DIAGNOSIS — K219 Gastro-esophageal reflux disease without esophagitis: Secondary | ICD-10-CM

## 2020-07-04 ENCOUNTER — Telehealth: Payer: Self-pay

## 2020-07-04 NOTE — Telephone Encounter (Signed)
PA initiated via Covermymeds; KEY: [id number]. Awaiting determination.

## 2020-07-05 MED ORDER — DICLOFENAC POTASSIUM 50 MG PO TABS: 50.0000 mg | ORAL_TABLET | Freq: Two times a day (BID) | ORAL | 2 refills | Status: DC

## 2020-07-05 NOTE — Telephone Encounter (Signed)
PA denied. Formulary alternative is diclofenac potassium (?)

## 2020-07-10 DIAGNOSIS — Z96611 Presence of right artificial shoulder joint: Secondary | ICD-10-CM | POA: Diagnosis not present

## 2020-07-10 DIAGNOSIS — Z471 Aftercare following joint replacement surgery: Secondary | ICD-10-CM | POA: Diagnosis not present

## 2020-07-11 NOTE — Telephone Encounter (Signed)
Patient called back asking provider to sent in diclofenac potassium 75 mg . Patient would like to take medication twice daily . Patient diclofenac sodium 75 does not absorb well.   Please advise

## 2020-07-11 NOTE — Telephone Encounter (Signed)
Dr. Nani Ravens sent in the formulary alternative 6 days ago.

## 2020-07-11 NOTE — Telephone Encounter (Signed)
Patient states her diclofenac sodium 75 mg was declined by insurance. Patient states Dr. Nani Ravens will have to appeal the decision. Patient states she has been on this medication for 15+ years.  Patient would like for Dr. Nani Ravens to know she had right shoulder replacement surgery and everything when well.

## 2020-07-11 NOTE — Telephone Encounter (Signed)
Is Cataflam diclofenac sodium?

## 2020-07-11 NOTE — Telephone Encounter (Signed)
That's fine TY

## 2020-07-12 NOTE — Telephone Encounter (Signed)
Sent in

## 2020-07-29 ENCOUNTER — Other Ambulatory Visit: Payer: Self-pay | Admitting: Family Medicine

## 2020-07-29 DIAGNOSIS — F4310 Post-traumatic stress disorder, unspecified: Secondary | ICD-10-CM

## 2020-07-29 DIAGNOSIS — E1165 Type 2 diabetes mellitus with hyperglycemia: Secondary | ICD-10-CM

## 2020-07-29 DIAGNOSIS — E7800 Pure hypercholesterolemia, unspecified: Secondary | ICD-10-CM

## 2020-08-07 DIAGNOSIS — Z96611 Presence of right artificial shoulder joint: Secondary | ICD-10-CM | POA: Diagnosis not present

## 2020-08-07 DIAGNOSIS — Z471 Aftercare following joint replacement surgery: Secondary | ICD-10-CM | POA: Diagnosis not present

## 2020-08-14 DIAGNOSIS — M25611 Stiffness of right shoulder, not elsewhere classified: Secondary | ICD-10-CM | POA: Diagnosis not present

## 2020-08-14 DIAGNOSIS — R531 Weakness: Secondary | ICD-10-CM | POA: Diagnosis not present

## 2020-08-14 DIAGNOSIS — Z96611 Presence of right artificial shoulder joint: Secondary | ICD-10-CM | POA: Diagnosis not present

## 2020-08-16 DIAGNOSIS — M25611 Stiffness of right shoulder, not elsewhere classified: Secondary | ICD-10-CM | POA: Diagnosis not present

## 2020-08-16 DIAGNOSIS — R531 Weakness: Secondary | ICD-10-CM | POA: Diagnosis not present

## 2020-08-16 DIAGNOSIS — Z96611 Presence of right artificial shoulder joint: Secondary | ICD-10-CM | POA: Diagnosis not present

## 2020-08-21 DIAGNOSIS — M25611 Stiffness of right shoulder, not elsewhere classified: Secondary | ICD-10-CM | POA: Diagnosis not present

## 2020-08-21 DIAGNOSIS — R531 Weakness: Secondary | ICD-10-CM | POA: Diagnosis not present

## 2020-08-21 DIAGNOSIS — Z96611 Presence of right artificial shoulder joint: Secondary | ICD-10-CM | POA: Diagnosis not present

## 2020-08-23 DIAGNOSIS — R531 Weakness: Secondary | ICD-10-CM | POA: Diagnosis not present

## 2020-08-23 DIAGNOSIS — Z96611 Presence of right artificial shoulder joint: Secondary | ICD-10-CM | POA: Diagnosis not present

## 2020-08-23 DIAGNOSIS — M25611 Stiffness of right shoulder, not elsewhere classified: Secondary | ICD-10-CM | POA: Diagnosis not present

## 2020-08-27 DIAGNOSIS — R531 Weakness: Secondary | ICD-10-CM | POA: Diagnosis not present

## 2020-08-27 DIAGNOSIS — Z96611 Presence of right artificial shoulder joint: Secondary | ICD-10-CM | POA: Diagnosis not present

## 2020-08-27 DIAGNOSIS — M25611 Stiffness of right shoulder, not elsewhere classified: Secondary | ICD-10-CM | POA: Diagnosis not present

## 2020-08-29 DIAGNOSIS — R531 Weakness: Secondary | ICD-10-CM | POA: Diagnosis not present

## 2020-08-29 DIAGNOSIS — Z96611 Presence of right artificial shoulder joint: Secondary | ICD-10-CM | POA: Diagnosis not present

## 2020-08-29 DIAGNOSIS — M25611 Stiffness of right shoulder, not elsewhere classified: Secondary | ICD-10-CM | POA: Diagnosis not present

## 2020-09-03 DIAGNOSIS — R531 Weakness: Secondary | ICD-10-CM | POA: Diagnosis not present

## 2020-09-03 DIAGNOSIS — M25611 Stiffness of right shoulder, not elsewhere classified: Secondary | ICD-10-CM | POA: Diagnosis not present

## 2020-09-03 DIAGNOSIS — Z96611 Presence of right artificial shoulder joint: Secondary | ICD-10-CM | POA: Diagnosis not present

## 2020-09-05 DIAGNOSIS — M25611 Stiffness of right shoulder, not elsewhere classified: Secondary | ICD-10-CM | POA: Diagnosis not present

## 2020-09-05 DIAGNOSIS — Z96611 Presence of right artificial shoulder joint: Secondary | ICD-10-CM | POA: Diagnosis not present

## 2020-09-05 DIAGNOSIS — R531 Weakness: Secondary | ICD-10-CM | POA: Diagnosis not present

## 2020-09-10 DIAGNOSIS — R531 Weakness: Secondary | ICD-10-CM | POA: Diagnosis not present

## 2020-09-10 DIAGNOSIS — M25611 Stiffness of right shoulder, not elsewhere classified: Secondary | ICD-10-CM | POA: Diagnosis not present

## 2020-09-10 DIAGNOSIS — Z96611 Presence of right artificial shoulder joint: Secondary | ICD-10-CM | POA: Diagnosis not present

## 2020-09-12 DIAGNOSIS — M25611 Stiffness of right shoulder, not elsewhere classified: Secondary | ICD-10-CM | POA: Diagnosis not present

## 2020-09-12 DIAGNOSIS — R531 Weakness: Secondary | ICD-10-CM | POA: Diagnosis not present

## 2020-09-12 DIAGNOSIS — Z96611 Presence of right artificial shoulder joint: Secondary | ICD-10-CM | POA: Diagnosis not present

## 2020-09-16 DIAGNOSIS — Z96611 Presence of right artificial shoulder joint: Secondary | ICD-10-CM | POA: Diagnosis not present

## 2020-09-16 DIAGNOSIS — M25611 Stiffness of right shoulder, not elsewhere classified: Secondary | ICD-10-CM | POA: Diagnosis not present

## 2020-09-16 DIAGNOSIS — R531 Weakness: Secondary | ICD-10-CM | POA: Diagnosis not present

## 2020-09-19 DIAGNOSIS — M25611 Stiffness of right shoulder, not elsewhere classified: Secondary | ICD-10-CM | POA: Diagnosis not present

## 2020-09-19 DIAGNOSIS — R531 Weakness: Secondary | ICD-10-CM | POA: Diagnosis not present

## 2020-09-19 DIAGNOSIS — Z96611 Presence of right artificial shoulder joint: Secondary | ICD-10-CM | POA: Diagnosis not present

## 2020-09-20 DIAGNOSIS — S2232XA Fracture of one rib, left side, initial encounter for closed fracture: Secondary | ICD-10-CM | POA: Diagnosis not present

## 2020-09-20 DIAGNOSIS — Z041 Encounter for examination and observation following transport accident: Secondary | ICD-10-CM | POA: Diagnosis not present

## 2020-09-20 DIAGNOSIS — E119 Type 2 diabetes mellitus without complications: Secondary | ICD-10-CM | POA: Diagnosis not present

## 2020-09-20 DIAGNOSIS — R079 Chest pain, unspecified: Secondary | ICD-10-CM | POA: Diagnosis not present

## 2020-09-20 DIAGNOSIS — F4310 Post-traumatic stress disorder, unspecified: Secondary | ICD-10-CM | POA: Diagnosis not present

## 2020-09-20 DIAGNOSIS — F419 Anxiety disorder, unspecified: Secondary | ICD-10-CM | POA: Diagnosis not present

## 2020-09-20 DIAGNOSIS — R457 State of emotional shock and stress, unspecified: Secondary | ICD-10-CM | POA: Diagnosis not present

## 2020-09-20 DIAGNOSIS — S301XXA Contusion of abdominal wall, initial encounter: Secondary | ICD-10-CM | POA: Diagnosis not present

## 2020-09-20 DIAGNOSIS — S2222XA Fracture of body of sternum, initial encounter for closed fracture: Secondary | ICD-10-CM | POA: Diagnosis not present

## 2020-09-20 DIAGNOSIS — R0789 Other chest pain: Secondary | ICD-10-CM | POA: Diagnosis not present

## 2020-09-20 DIAGNOSIS — M549 Dorsalgia, unspecified: Secondary | ICD-10-CM | POA: Diagnosis not present

## 2020-09-26 DIAGNOSIS — Z96611 Presence of right artificial shoulder joint: Secondary | ICD-10-CM | POA: Diagnosis not present

## 2020-09-26 DIAGNOSIS — M25611 Stiffness of right shoulder, not elsewhere classified: Secondary | ICD-10-CM | POA: Diagnosis not present

## 2020-09-26 DIAGNOSIS — R531 Weakness: Secondary | ICD-10-CM | POA: Diagnosis not present

## 2020-10-01 DIAGNOSIS — M25611 Stiffness of right shoulder, not elsewhere classified: Secondary | ICD-10-CM | POA: Diagnosis not present

## 2020-10-01 DIAGNOSIS — R531 Weakness: Secondary | ICD-10-CM | POA: Diagnosis not present

## 2020-10-01 DIAGNOSIS — Z96611 Presence of right artificial shoulder joint: Secondary | ICD-10-CM | POA: Diagnosis not present

## 2020-10-03 ENCOUNTER — Ambulatory Visit: Payer: Medicare Other | Admitting: Family Medicine

## 2020-10-03 DIAGNOSIS — Z96611 Presence of right artificial shoulder joint: Secondary | ICD-10-CM | POA: Diagnosis not present

## 2020-10-03 DIAGNOSIS — R531 Weakness: Secondary | ICD-10-CM | POA: Diagnosis not present

## 2020-10-03 DIAGNOSIS — M25611 Stiffness of right shoulder, not elsewhere classified: Secondary | ICD-10-CM | POA: Diagnosis not present

## 2020-10-07 ENCOUNTER — Other Ambulatory Visit: Payer: Self-pay

## 2020-10-07 ENCOUNTER — Ambulatory Visit: Payer: Medicare Other | Admitting: Family Medicine

## 2020-10-07 ENCOUNTER — Ambulatory Visit (INDEPENDENT_AMBULATORY_CARE_PROVIDER_SITE_OTHER): Payer: Medicare Other | Admitting: Family Medicine

## 2020-10-07 ENCOUNTER — Encounter: Payer: Self-pay | Admitting: Family Medicine

## 2020-10-07 VITALS — BP 144/90 | HR 58 | Temp 99.5°F | Ht 63.0 in | Wt 148.1 lb

## 2020-10-07 DIAGNOSIS — R030 Elevated blood-pressure reading, without diagnosis of hypertension: Secondary | ICD-10-CM | POA: Diagnosis not present

## 2020-10-07 DIAGNOSIS — S2232XA Fracture of one rib, left side, initial encounter for closed fracture: Secondary | ICD-10-CM | POA: Diagnosis not present

## 2020-10-07 MED ORDER — OXYCODONE HCL 5 MG PO TABS
5.0000 mg | ORAL_TABLET | Freq: Four times a day (QID) | ORAL | 0 refills | Status: DC | PRN
Start: 2020-10-07 — End: 2020-12-04

## 2020-10-07 MED ORDER — TIZANIDINE HCL 4 MG PO TABS: 4.0000 mg | ORAL_TABLET | Freq: Four times a day (QID) | ORAL | 0 refills | Status: DC | PRN

## 2020-10-07 NOTE — Patient Instructions (Signed)
Ice/cold pack over area for 10-15 min twice daily.  Heat (pad or rice pillow in microwave) over affected area, 10-15 minutes twice daily.   OK to take Tylenol 1000 mg (2 extra strength tabs) or 975 mg (3 regular strength tabs) every 6 hours as needed.  Do not drink alcohol, do any illicit/street drugs, drive or do anything that requires alertness while on this medicine.   Let us know if you need anything.
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
## 2020-10-21 DIAGNOSIS — Z96611 Presence of right artificial shoulder joint: Secondary | ICD-10-CM | POA: Diagnosis not present

## 2020-10-21 DIAGNOSIS — M25611 Stiffness of right shoulder, not elsewhere classified: Secondary | ICD-10-CM | POA: Diagnosis not present

## 2020-10-21 DIAGNOSIS — R531 Weakness: Secondary | ICD-10-CM | POA: Diagnosis not present

## 2020-10-23 ENCOUNTER — Telehealth: Payer: Self-pay | Admitting: Family Medicine

## 2020-10-23 DIAGNOSIS — M25611 Stiffness of right shoulder, not elsewhere classified: Secondary | ICD-10-CM | POA: Diagnosis not present

## 2020-10-23 DIAGNOSIS — R531 Weakness: Secondary | ICD-10-CM | POA: Diagnosis not present

## 2020-10-23 DIAGNOSIS — Z96611 Presence of right artificial shoulder joint: Secondary | ICD-10-CM | POA: Diagnosis not present

## 2020-10-23 DIAGNOSIS — K219 Gastro-esophageal reflux disease without esophagitis: Secondary | ICD-10-CM

## 2020-10-23 MED ORDER — MISOPROSTOL 200 MCG PO TABS: ORAL_TABLET | ORAL | 2 refills | Status: DC

## 2020-10-23 NOTE — Telephone Encounter (Signed)
Medication: diclofenac (CATAFLAM) 50 MG tablet  misoprostol (CYTOTEC) 200 MCG tablet [[id number]   Has the patient contacted their pharmacy? No. (If no, request that the patient contact the pharmacy for the refill.) (If yes, when and what did the pharmacy advise?)  Preferred Pharmacy (with phone number or street name):  [facility] [address]  [place], [place] [address]  Phone:  [phone] Fax:  [phone]  DEA #:  --  DAW Reason: --     Agent: Please be advised that RX refills may take up to 3 business days. We ask that you follow-up with your pharmacy.

## 2020-10-25 ENCOUNTER — Telehealth: Payer: Self-pay | Admitting: Family Medicine

## 2020-10-25 MED ORDER — DICLOFENAC POTASSIUM 50 MG PO TABS: 50.0000 mg | ORAL_TABLET | Freq: Two times a day (BID) | ORAL | 2 refills | Status: DC

## 2020-10-25 NOTE — Telephone Encounter (Signed)
Pt, called  following up on her prescription diclofenac (CATAFLAM) 50 MG tablet  misoprostol (CYTOTEC) 200 MCG tablet [[id number]  Please advice

## 2020-10-25 NOTE — Telephone Encounter (Signed)
Refills done.

## 2020-10-28 DIAGNOSIS — R531 Weakness: Secondary | ICD-10-CM | POA: Diagnosis not present

## 2020-10-28 DIAGNOSIS — M25611 Stiffness of right shoulder, not elsewhere classified: Secondary | ICD-10-CM | POA: Diagnosis not present

## 2020-10-30 DIAGNOSIS — Z96611 Presence of right artificial shoulder joint: Secondary | ICD-10-CM | POA: Diagnosis not present

## 2020-10-30 DIAGNOSIS — M25611 Stiffness of right shoulder, not elsewhere classified: Secondary | ICD-10-CM | POA: Diagnosis not present

## 2020-10-30 DIAGNOSIS — R531 Weakness: Secondary | ICD-10-CM | POA: Diagnosis not present

## 2020-11-05 DIAGNOSIS — M25611 Stiffness of right shoulder, not elsewhere classified: Secondary | ICD-10-CM | POA: Diagnosis not present

## 2020-11-05 DIAGNOSIS — Z96611 Presence of right artificial shoulder joint: Secondary | ICD-10-CM | POA: Diagnosis not present

## 2020-11-05 DIAGNOSIS — R531 Weakness: Secondary | ICD-10-CM | POA: Diagnosis not present

## 2020-11-07 DIAGNOSIS — M25611 Stiffness of right shoulder, not elsewhere classified: Secondary | ICD-10-CM | POA: Diagnosis not present

## 2020-11-07 DIAGNOSIS — Z96611 Presence of right artificial shoulder joint: Secondary | ICD-10-CM | POA: Diagnosis not present

## 2020-11-07 DIAGNOSIS — R531 Weakness: Secondary | ICD-10-CM | POA: Diagnosis not present

## 2020-11-12 DIAGNOSIS — Z96611 Presence of right artificial shoulder joint: Secondary | ICD-10-CM | POA: Diagnosis not present

## 2020-11-12 DIAGNOSIS — M25611 Stiffness of right shoulder, not elsewhere classified: Secondary | ICD-10-CM | POA: Diagnosis not present

## 2020-11-12 DIAGNOSIS — R531 Weakness: Secondary | ICD-10-CM | POA: Diagnosis not present

## 2020-11-14 DIAGNOSIS — M25611 Stiffness of right shoulder, not elsewhere classified: Secondary | ICD-10-CM | POA: Diagnosis not present

## 2020-11-14 DIAGNOSIS — Z96611 Presence of right artificial shoulder joint: Secondary | ICD-10-CM | POA: Diagnosis not present

## 2020-11-14 DIAGNOSIS — R531 Weakness: Secondary | ICD-10-CM | POA: Diagnosis not present

## 2020-11-19 DIAGNOSIS — M25611 Stiffness of right shoulder, not elsewhere classified: Secondary | ICD-10-CM | POA: Diagnosis not present

## 2020-11-19 DIAGNOSIS — Z96611 Presence of right artificial shoulder joint: Secondary | ICD-10-CM | POA: Diagnosis not present

## 2020-11-19 DIAGNOSIS — R531 Weakness: Secondary | ICD-10-CM | POA: Diagnosis not present

## 2020-11-21 DIAGNOSIS — Z96611 Presence of right artificial shoulder joint: Secondary | ICD-10-CM | POA: Diagnosis not present

## 2020-11-21 DIAGNOSIS — R531 Weakness: Secondary | ICD-10-CM | POA: Diagnosis not present

## 2020-11-21 DIAGNOSIS — M25611 Stiffness of right shoulder, not elsewhere classified: Secondary | ICD-10-CM | POA: Diagnosis not present

## 2020-11-25 ENCOUNTER — Other Ambulatory Visit: Payer: Self-pay | Admitting: Family Medicine

## 2020-11-25 DIAGNOSIS — F4310 Post-traumatic stress disorder, unspecified: Secondary | ICD-10-CM

## 2020-11-25 NOTE — Telephone Encounter (Signed)
Last OV--10/07/2020 Last RF for Alprazolam was on 05/28/2020--#30 with 5 refills. Last RF for Trazodone was on 07/29/2020--#30 with 3 refills. UDS/CSC updated 06/11/2019.

## 2020-12-02 ENCOUNTER — Ambulatory Visit: Payer: Medicare Other | Admitting: Family Medicine

## 2020-12-03 NOTE — Progress Notes (Signed)
Subjective:   Alisha Matthews is a 70 y.o. female who presents for Medicare Annual (Subsequent) preventive examination.   Review of Systems     Cardiac Risk Factors include: advanced age (>67men, >72 women);diabetes mellitus;dyslipidemia     Objective:    Today's Vitals   12/04/20 1059  BP: (!) 144/72  Pulse: 75  Resp: 16  Temp: 98.8 F (37.1 C)  SpO2: 99%  Weight: 145 lb (65.8 kg)  Height: 5\' 3"  (1.6 m)   Body mass index is 25.69 kg/m.  Advanced Directives 12/04/2020 06/27/2020 11/27/2019 03/02/2017 02/25/2016 10/15/2014 02/20/2014  Does Patient Have a Medical Advance Directive? Yes No Yes Yes Yes No;Yes Yes  Type of Paramedic of [place];Living will - [place];Living will Living will Living will [place];Living will -  Does patient want to make changes to medical advance directive? - - No - Patient declined No - Patient declined No - Patient declined No - Patient declined -  Copy of [place] in Chart? No - copy requested - No - copy requested - No - copy requested No - copy requested -  Would patient like information on creating a medical advance directive? - No - Patient declined - - - - -    Current Medications (verified) Outpatient Encounter Medications as of 12/04/2020  Medication Sig   ALPRAZolam (XANAX) 0.25 MG tablet Take 1 tablet (0.25 mg total) by mouth daily as needed for anxiety.   atorvastatin (LIPITOR) 20 MG tablet TAKE 1 TABLET BY MOUTH DAILY   busPIRone (BUSPAR) 7.5 MG tablet Take 1 tablet (7.5 mg total) by mouth 2 (two) times daily.   Calcium-Magnesium-Vitamin D (CALCIUM 500 PO) Take 500 mg by mouth 3 (three) times daily.   clindamycin (CLEOCIN) 150 MG capsule Take 600 mg by mouth See admin instructions. Dental procedures   Coenzyme Q10 (COQ10) 200 MG CAPS Take 200 mg by mouth daily.   diclofenac (CATAFLAM) 50 MG tablet Take 1 tablet (50 mg total) by mouth 2 (two) times daily.    Diindolylmethane POWD 1 Package by Does not apply route daily. Reported on 09/10/2015   diphenhydrAMINE (BENADRYL) 25 MG tablet Take 25 mg by mouth at bedtime as needed for sleep. When not taking xanax.   esomeprazole (NEXIUM) 40 MG capsule TAKE 1 CAPSULE BY MOUTH DAILY   frovatriptan (FROVA) 2.5 MG tablet Take 1 tablet (2.5 mg total) by mouth as needed for migraine (max 3 tabs in 24 hours). If recurs, may repeat after 2 hours. Max of 3 tabs in 24 hours.   LevOCARNitine (CARNITINE PO) Take 3,000 mg by mouth daily.    metFORMIN (GLUCOPHAGE-XR) 500 MG 24 hr tablet Take 1 tablet (500 mg total) by mouth 2 (two) times daily.   misoprostol (CYTOTEC) 200 MCG tablet TAKE 1 TABLET BY MOUTH 2 TIMES DAILY.   Multiple Vitamins-Minerals (ANTIOXIDANT PO) Take 1 capsule by mouth daily. Fruit   OVER THE COUNTER MEDICATION Take 1 capsule by mouth daily. Procast package: Elite-100 omega 3 vitamin   OVER THE COUNTER MEDICATION Take 1 capsule by mouth daily. Procast: Memory,brain and liver vitamin   OVER THE COUNTER MEDICATION Take 1 capsule by mouth daily. Circulation and vein support vitamin   PARoxetine (PAXIL) 20 MG tablet Take 1 tablet (20 mg total) by mouth daily.   Probiotic Product (PROBIOTIC DAILY PO) Take 1 capsule by mouth daily.   traZODone (DESYREL) 50 MG tablet TAKE 1/2 TO 1 TABLET BY  MOUTH AT [facility] NEEDED FOR SLEEP   [DISCONTINUED] oxyCODONE (OXY IR/ROXICODONE) 5 MG immediate release tablet Take 1 tablet (5 mg total) by mouth every 6 (six) hours as needed for severe pain.   [DISCONTINUED] tiZANidine (ZANAFLEX) 4 MG tablet Take 1 tablet (4 mg total) by mouth every 6 (six) hours as needed for muscle spasms.   No facility-administered encounter medications on file as of 12/04/2020.    Allergies (verified) Penicillins   History: Past Medical History:  Diagnosis Date   Anemia, unspecified    Anxiety    Arthritis    Asthma    mild exercise induced asthma as a child    Barrett's esophagus     Breast cancer ([place])    right breast invasive ductal carcinoma    Breast fibrocystic disorder    Bronchitis    Chicken pox    Constipation, chronic    Depressive disorder, not elsewhere classified    Diabetes mellitus    borderline   Exercise induced bronchospasm    Genital warts    GERD (gastroesophageal reflux disease)    Hyperlipidemia    Infected postoperative breast seroma    Insomnia, unspecified    Migraine, unspecified, without mention of intractable migraine without mention of status migrainosus    Obesity, unspecified    Osteoarthrosis, unspecified whether generalized or localized, ankle and foot    ankle/foot   Personal history of colonic polyps    Symptomatic menopausal or female climacteric states    Past Surgical History:  Procedure Laterality Date   BREAST LUMPECTOMY WITH NEEDLE LOCALIZATION AND AXILLARY SENTINEL LYMPH NODE BX  06/08/2012   Procedure: BREAST LUMPECTOMY WITH NEEDLE LOCALIZATION AND AXILLARY SENTINEL LYMPH NODE BX;  Surgeon: Rolm Bookbinder, MD;  Location: [facility];  Service: General;  Laterality: Right;   BREAST SURGERY     BUNIONECTOMY     3 surgeries on both feet   CHOLECYSTECTOMY     COLONOSCOPY     EYE SURGERY     Orbital right eye surgery   FOOT SURGERY     Left-revision   JOINT REPLACEMENT     NASAL SEPTUM SURGERY     RE-EXCISION OF BREAST CANCER,SUPERIOR MARGINS  06/27/2012   Procedure: RE-EXCISION OF BREAST CANCER,SUPERIOR MARGINS;  Surgeon: Rolm Bookbinder, MD;  Location: [facility];  Service: General;  Laterality: Right;   TOTAL HIP ARTHROPLASTY     left      Perthes disease   TOTAL KNEE ARTHROPLASTY Left 02/20/2014   Procedure: TOTAL KNEE ARTHROPLASTY;  Surgeon: Hessie Dibble, MD;  Location: [facility];  Service: Orthopedics;  Laterality: Left;   TOTAL SHOULDER ARTHROPLASTY Right 06/27/2020   Procedure: TOTAL SHOULDER ARTHROPLASTY;  Surgeon: Tania Ade, MD;  Location: [facility];  Service: Orthopedics;   Laterality: Right;   Family History  Adopted: Yes  Problem Relation Age of Onset   Hypertension Mother    Diabetes Mother    Heart failure Mother    Heart disease Mother    Hyperlipidemia Mother    Stroke Mother    Mental illness Mother    Cancer Maternal Grandmother 70       non hodgkins lymphoma   Cancer Maternal Grandfather    Deep vein thrombosis Brother    Social History   Socioeconomic History   Marital status: Married    Spouse name: Shanon Brow   Number of children: 0   Years of education: college   Highest education level: Not on file  Occupational  History   Occupation: retired    Fish farm manager: [employer]    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 No domestic abuse; 5 cats; no children.      Lives: with husband, 5 cats.      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      Tobacco: none       Alcohol: none      Drugs: none      Exercise:  Stair stepper x 30 minutes five days per week.  Spin gym weights daily.  Gardening.      Caffeine: + Moderate caffeine use.          Seatbelt: 100%; no texting while driving.      Guns:  Unloaded gun.      Advanced Directives:  Living Will --- FULL CODE but no prolonged measures.        ADLs: independent with ADLs; no assistant devices; drives.   Social Determinants of Health   Financial Resource Strain: Low Risk    Difficulty of Paying Living Expenses: Not hard at all  Food Insecurity: No Food Insecurity   Worried About Charity fundraiser in the Last Year: Never true   [place] in the Last Year: Never true  Transportation Needs: No Transportation Needs   Lack of Transportation (Medical): No   Lack of Transportation (Non-Medical): No  Physical Activity: Sufficiently Active   Days of Exercise per Week: 7 days   Minutes of Exercise per Session: 30 min  Stress: No Stress Concern Present   Feeling of Stress : Not at all  Social Connections: Moderately Isolated   Frequency of Communication with Friends and Family: More than three times a week   Frequency of Social Gatherings with Friends and Family: More than three times a week   Attends Religious Services: Never   Marine scientist or Organizations: No   Attends Music therapist: Never   Marital Status: Married    Tobacco Counseling Counseling given: Not Answered   Clinical Intake:  Pre-visit preparation completed: Yes  Pain : No/denies pain     Nutritional Status: BMI 25 -29 Overweight Nutritional Risks: None Diabetes: Yes CBG done?: No Did pt. bring in CBG monitor from home?: No  How often do you need to have someone help you when you read instructions, pamphlets, or other written materials from your doctor or pharmacy?: 1 - Never  Diabetes:  Is the patient diabetic?  Yes  If diabetic, was a CBG obtained today?  No  Did the patient bring in their glucometer from home?  No  How often do you monitor your CBG's? never.   Financial Strains and Diabetes Management:  Are you having any financial strains with the device, your supplies or your medication? No .  Does the patient want to be seen by Chronic Care Management for management of their diabetes?  No  Would the patient like  to be referred to a Nutritionist or for Diabetic Management?  No   Diabetic Exams:  Diabetic Eye Exam: Completed 03/25/2020.   Diabetic Foot Exam: Completed 12/04/2020   Interpreter Needed?: No  Information entered by :: Caroleen Hamman LPN   Activities of Daily Living In your present state  of health, do you have any difficulty performing the following activities: 12/04/2020 06/17/2020  Hearing? N N  Vision? N N  Difficulty concentrating or making decisions? N N  Walking or climbing stairs? N N  Dressing or bathing? N N  Doing errands, shopping? N N  Preparing Food and eating ? N -  Using the Toilet? N -  In the past six months, have you accidently leaked urine? N -  Do you have problems with loss of bowel control? N -  Managing your Medications? N -  Managing your Finances? N -  Housekeeping or managing your Housekeeping? N -  Some recent data might be hidden    Patient Care Team: Shelda Pal, DO as PCP - General (Family Medicine)  Indicate any recent Medical Services you may have received from other than [facility] providers in the past year (date may be approximate).     Assessment:   This is a routine wellness examination for [place].  Hearing/Vision screen Hearing Screening - Comments:: No issues Vision Screening - Comments:: Wears glasses Last eye exam-03/2020-Dr.Woods  Dietary issues and exercise activities discussed: Current Exercise Habits: Home exercise routine, Type of exercise: walking;strength training/weights, Time (Minutes): 30, Frequency (Times/Week): 7, Weekly Exercise (Minutes/Week): 210, Intensity: Mild, Exercise limited by: None identified   Goals Addressed             This Visit's Progress    Maintain healthy active lifestyle.   On track      Depression Screen PHQ 2/9 Scores 12/04/2020 12/04/2020 11/27/2019 11/25/2018 08/30/2018 03/09/2018 08/30/2017  PHQ - 2 Score 0 0 0 0 0 0 0  PHQ- 9 Score - - - 1 - - -    Fall Risk Fall Risk  12/04/2020 12/04/2020 11/27/2019 11/25/2018 08/30/2018  Falls in the past year? 0 0 0 0 0  Number falls in past yr: 0 0 0 0 -  Injury with Fall? 0 0 0 0 -  Risk for fall due to : - No Fall Risks - - -  Follow up Falls prevention discussed Falls evaluation completed Education provided;Falls prevention discussed  Falls evaluation completed Falls evaluation completed    FALL RISK PREVENTION PERTAINING TO THE HOME:  Any stairs in or around the home? Yes  If so, are there any without handrails? No  Home free of loose throw rugs in walkways, pet beds, electrical cords, etc? Yes  Adequate lighting in your home to reduce risk of falls? Yes   ASSISTIVE DEVICES UTILIZED TO PREVENT FALLS:  Life alert? No  Use of a cane, walker or w/c? No  Grab bars in the bathroom? Yes  Shower chair or bench in shower? No  Elevated toilet seat or a handicapped toilet? No   TIMED UP AND GO:  Was the test performed? Yes .  Length of time to ambulate 10 feet: 9 sec.   Gait steady and fast without use of assistive device  Cognitive Function:Normal cognitive status assessed by direct observation by this Nurse Health Advisor. No abnormalities found.          Immunizations Immunization History  Administered Date(s) Administered   Influenza Split 03/22/2012, 04/08/2015   Influenza, High Dose  Seasonal PF 04/06/2018, 03/12/2020   Influenza,inj,Quad PF,6+ Mos 05/01/2013, 03/19/2014   Influenza-Unspecified 04/20/2016, 03/01/2019   Moderna Sars-Covid-2 Vaccination 08/24/2019, 09/21/2019, 04/16/2020   Pneumococcal Conjugate-13 07/09/2014   Pneumococcal Polysaccharide-23 06/22/2005, 09/10/2015   Tdap 08/30/2017   Zoster, Live 08/01/2013    TDAP status: Up to date  Flu Vaccine status: Up to date  Pneumococcal vaccine status: Up to date  Covid-19 vaccine status: Completed vaccines  Qualifies for Shingles Vaccine? Yes   Zostavax completed Yes   Shingrix Completed?: No.    Education has been provided regarding the importance of this vaccine. Patient has been advised to call insurance company to determine out of pocket expense if they have not yet received this vaccine. Advised may also receive vaccine at local pharmacy or Health Dept. Verbalized acceptance and understanding.  Screening Tests Health Maintenance   Topic Date Due   Zoster Vaccines- Shingrix (1 of 2) Never done   COVID-19 Vaccine (4 - Booster for Moderna series) 07/17/2020   HEMOGLOBIN A1C  12/03/2020   DEXA SCAN  12/04/2021 (Originally 11/27/2013)   INFLUENZA VACCINE  01/20/2021   OPHTHALMOLOGY EXAM  03/25/2021   URINE MICROALBUMIN  06/04/2021   COLONOSCOPY (Pts 45-74yrs Insurance coverage will need to be confirmed)  08/24/2021   FOOT EXAM  12/04/2021   MAMMOGRAM  06/13/2022   TETANUS/TDAP  08/31/2027   Hepatitis C Screening  Completed   PNA vac Low Risk Adult  Completed   HPV VACCINES  Aged Out    Health Maintenance  Health Maintenance Due  Topic Date Due   Zoster Vaccines- Shingrix (1 of 2) Never done   COVID-19 Vaccine (4 - Booster for Moderna series) 07/17/2020   HEMOGLOBIN A1C  12/03/2020    Colorectal cancer screening: Type of screening: Colonoscopy. Completed 08/25/2011. Repeat every 10 years  Mammogram status: Completed Bilateral 06/13/2020. Repeat every year  Bone Density status: Delcined  Lung Cancer Screening: (Low Dose CT Chest recommended if Age 6-80 years, 30 pack-year currently smoking OR have quit w/in 15years.) does not qualify.     Additional Screening:  Hepatitis C Screening: Completed 04/26/2015  Vision Screening: Recommended annual ophthalmology exams for early detection of glaucoma and other disorders of the eye. Is the patient up to date with their annual eye exam?  Yes  Who is the provider or what is the name of the office in which the patient attends annual eye exams? Dr. Sherral Hammers   Dental Screening: Recommended annual dental exams for proper oral hygiene  Community Resource Referral / Chronic Care Management: CRR required this visit?  No   CCM required this visit?  No      Plan:     I have personally reviewed and noted the following in the patient's chart:   Medical and social history Use of alcohol, tobacco or illicit drugs  Current medications and supplements including opioid  prescriptions.  Functional ability and status Nutritional status Physical activity Advanced directives List of other physicians Hospitalizations, surgeries, and ER visits in previous 12 months Vitals Screenings to include cognitive, depression, and falls Referrals and appointments  In addition, I have reviewed and discussed with patient certain preventive protocols, quality metrics, and best practice recommendations. A written personalized care plan for preventive services as well as general preventive health recommendations were provided to patient.   Patient to access avs on mychart.   Marta Antu, LPN   7/34/2876  Nurse Health Advisor  Nurse Notes: None

## 2020-12-04 ENCOUNTER — Encounter: Payer: Self-pay | Admitting: Family Medicine

## 2020-12-04 ENCOUNTER — Ambulatory Visit (INDEPENDENT_AMBULATORY_CARE_PROVIDER_SITE_OTHER): Payer: Medicare Other

## 2020-12-04 ENCOUNTER — Ambulatory Visit (INDEPENDENT_AMBULATORY_CARE_PROVIDER_SITE_OTHER): Payer: Medicare Other | Admitting: Family Medicine

## 2020-12-04 ENCOUNTER — Other Ambulatory Visit: Payer: Self-pay

## 2020-12-04 VITALS — BP 144/72 | HR 75 | Temp 98.8°F | Resp 16 | Ht 63.0 in | Wt 145.0 lb

## 2020-12-04 VITALS — BP 144/72 | HR 75 | Temp 98.8°F | Ht 63.0 in | Wt 145.5 lb

## 2020-12-04 DIAGNOSIS — F419 Anxiety disorder, unspecified: Secondary | ICD-10-CM

## 2020-12-04 DIAGNOSIS — E7800 Pure hypercholesterolemia, unspecified: Secondary | ICD-10-CM

## 2020-12-04 DIAGNOSIS — E1165 Type 2 diabetes mellitus with hyperglycemia: Secondary | ICD-10-CM

## 2020-12-04 DIAGNOSIS — Z0000 Encounter for general adult medical examination without abnormal findings: Secondary | ICD-10-CM | POA: Diagnosis not present

## 2020-12-04 DIAGNOSIS — F32A Depression, unspecified: Secondary | ICD-10-CM

## 2020-12-04 DIAGNOSIS — Z79899 Other long term (current) drug therapy: Secondary | ICD-10-CM

## 2020-12-04 LAB — LIPID PANEL
Cholesterol: 193 mg/dL (ref 0–200)
HDL: 81.6 mg/dL (ref >39.00)
LDL Cholesterol: 85 mg/dL (ref 0–99)
NonHDL: 111.27
Total CHOL/HDL Ratio: 2
Triglycerides: 131 mg/dL (ref 0.0–149.0)
VLDL: 26.2 mg/dL (ref 0.0–40.0)

## 2020-12-04 LAB — MICROALBUMIN / CREATININE URINE RATIO
Creatinine,U: 141.1 mg/dL
Microalb Creat Ratio: 0.7 mg/g (ref 0.0–30.0)
Microalb, Ur: 1 mg/dL (ref 0.0–1.9)

## 2020-12-04 LAB — COMPREHENSIVE METABOLIC PANEL
ALT: 20 U/L (ref 0–35)
AST: 21 U/L (ref 0–37)
Albumin: 4.2 g/dL (ref 3.5–5.2)
Alkaline Phosphatase: 66 U/L (ref 39–117)
BUN: 14 mg/dL (ref 6–23)
CO2: 26 mEq/L (ref 19–32)
Calcium: 9 mg/dL (ref 8.4–10.5)
Chloride: 101 mEq/L (ref 96–112)
Creatinine, Ser: 0.66 mg/dL (ref 0.40–1.20)
GFR: 87.89 mL/min (ref >60.00)
Glucose, Bld: 95 mg/dL (ref 70–99)
Potassium: 4.1 mEq/L (ref 3.5–5.1)
Sodium: 135 mEq/L (ref 135–145)
Total Bilirubin: 0.4 mg/dL (ref 0.2–1.2)
Total Protein: 6.4 g/dL (ref 6.0–8.3)

## 2020-12-04 LAB — HEMOGLOBIN A1C: Hgb A1c MFr Bld: 6.1 % (ref 4.6–6.5)

## 2020-12-04 MED ORDER — BUSPIRONE HCL 7.5 MG PO TABS: 7.5000 mg | ORAL_TABLET | Freq: Two times a day (BID) | ORAL | 5 refills | Status: DC

## 2020-12-04 NOTE — Patient Instructions (Addendum)
Give [place] 2-3 business days to get the results of your labs back.   Keep the diet clean and stay active.  Please consider counseling. Contact [phone] to schedule an appointment or inquire about cost/insurance coverage.  The new Shingrix vaccine (for shingles) is a 2 shot series. It can make people feel low energy, achy and almost like they have the flu for 48 hours after injection. Please plan accordingly when deciding on when to get this shot. Call your pharmacy for an appointment to get this. The second shot of the series is less severe regarding the side effects, but it still lasts 48 hours.   Coping skills Choose 5 that work for you: Take a deep breath Count to 20 Read a book Do a puzzle Meditate Bake Sing Knit Garden Pray Go outside Call a friend Listen to music Take a walk Color Send a note Take a bath Watch a movie Be alone in a quiet place Pet an animal Visit a friend Journal Exercise Stretch   Let us know if you need anything.

## 2020-12-04 NOTE — Patient Instructions (Signed)
Alisha Matthews , Thank you for taking time to come for your Medicare Wellness Visit. I appreciate your ongoing commitment to your health goals. Please review the following plan we discussed and let me know if I can assist you in the future.   Screening recommendations/referrals: Colonoscopy: Completed 08/25/2011-Due 08/24/2021 Mammogram: Completed 06/13/2020-Due 06/13/2021 Bone Density: Declined Recommended yearly ophthalmology/optometry visit for glaucoma screening and checkup Recommended yearly dental visit for hygiene and checkup  Vaccinations: Influenza vaccine: Up to date Pneumococcal vaccine: Up to date Tdap vaccine: Up to date-Due 08/31/2027 Shingles vaccine: Discuss with pharmacy   Covid-19:Up to date  Advanced directives: Please bring a copy for your chart  Conditions/risks identified: See problem list  Next appointment: Follow up in one year for your annual wellness visit    Preventive Care 16 Years and Older, Female Preventive care refers to lifestyle choices and visits with your health care provider that can promote health and wellness. What does preventive care include? A yearly physical exam. This is also called an annual well check. Dental exams once or twice a year. Routine eye exams. Ask your health care provider how often you should have your eyes checked. Personal lifestyle choices, including: Daily care of your teeth and gums. Regular physical activity. Eating a healthy diet. Avoiding tobacco and drug use. Limiting alcohol use. Practicing safe sex. Taking low-dose aspirin every day. Taking vitamin and mineral supplements as recommended by your health care provider. What happens during an annual well check? The services and screenings done by your health care provider during your annual well check will depend on your age, overall health, lifestyle risk factors, and family history of disease. Counseling  Your health care provider may ask you questions about  your: Alcohol use. Tobacco use. Drug use. Emotional well-being. Home and relationship well-being. Sexual activity. Eating habits. History of falls. Memory and ability to understand (cognition). Work and work Statistician. Reproductive health. Screening  You may have the following tests or measurements: Height, weight, and BMI. Blood pressure. Lipid and cholesterol levels. These may be checked every 5 years, or more frequently if you are over 44 years old. Skin check. Lung cancer screening. You may have this screening every year starting at age 78 if you have a 30-pack-year history of smoking and currently smoke or have quit within the past 15 years. Fecal occult blood test (FOBT) of the stool. You may have this test every year starting at age 62. Flexible sigmoidoscopy or colonoscopy. You may have a sigmoidoscopy every 5 years or a colonoscopy every 10 years starting at age 72. Hepatitis C blood test. Hepatitis B blood test. Sexually transmitted disease (STD) testing. Diabetes screening. This is done by checking your blood sugar (glucose) after you have not eaten for a while (fasting). You may have this done every 1-3 years. Bone density scan. This is done to screen for osteoporosis. You may have this done starting at age 81. Mammogram. This may be done every 1-2 years. Talk to your health care provider about how often you should have regular mammograms. Talk with your health care provider about your test results, treatment options, and if necessary, the need for more tests. Vaccines  Your health care provider may recommend certain vaccines, such as: Influenza vaccine. This is recommended every year. Tetanus, diphtheria, and acellular pertussis (Tdap, Td) vaccine. You may need a Td booster every 10 years. Zoster vaccine. You may need this after age 32. Pneumococcal 13-valent conjugate (PCV13) vaccine. One dose is recommended after age 55.  Pneumococcal polysaccharide (PPSV23) vaccine.  One dose is recommended after age 20. Talk to your health care provider about which screenings and vaccines you need and how often you need them. This information is not intended to replace advice given to you by your health care provider. Make sure you discuss any questions you have with your health care provider. Document Released: 07/05/2015 Document Revised: 02/26/2016 Document Reviewed: 04/09/2015 Elsevier Interactive Patient Education  2017 [employer] Prevention in the Home Falls can cause injuries. They can happen to people of all ages. There are many things you can do to make your home safe and to help prevent falls. What can I do on the outside of my home? Regularly fix the edges of walkways and driveways and fix any cracks. Remove anything that might make you trip as you walk through a door, such as a raised step or threshold. Trim any bushes or trees on the path to your home. Use bright outdoor lighting. Clear any walking paths of anything that might make someone trip, such as rocks or tools. Regularly check to see if handrails are loose or broken. Make sure that both sides of any steps have handrails. Any raised decks and porches should have guardrails on the edges. Have any leaves, snow, or ice cleared regularly. Use sand or salt on walking paths during winter. Clean up any spills in your garage right away. This includes oil or grease spills. What can I do in the bathroom? Use night lights. Install grab bars by the toilet and in the tub and shower. Do not use towel bars as grab bars. Use non-skid mats or decals in the tub or shower. If you need to sit down in the shower, use a plastic, non-slip stool. Keep the floor dry. Clean up any water that spills on the floor as soon as it happens. Remove soap buildup in the tub or shower regularly. Attach bath mats securely with double-sided non-slip rug tape. Do not have throw rugs and other things on the floor that can make  you trip. What can I do in the bedroom? Use night lights. Make sure that you have a light by your bed that is easy to reach. Do not use any sheets or blankets that are too big for your bed. They should not hang down onto the floor. Have a firm chair that has side arms. You can use this for support while you get dressed. Do not have throw rugs and other things on the floor that can make you trip. What can I do in the kitchen? Clean up any spills right away. Avoid walking on wet floors. Keep items that you use a lot in easy-to-reach places. If you need to reach something above you, use a strong step stool that has a grab bar. Keep electrical cords out of the way. Do not use floor polish or wax that makes floors slippery. If you must use wax, use non-skid floor wax. Do not have throw rugs and other things on the floor that can make you trip. What can I do with my stairs? Do not leave any items on the stairs. Make sure that there are handrails on both sides of the stairs and use them. Fix handrails that are broken or loose. Make sure that handrails are as long as the stairways. Check any carpeting to make sure that it is firmly attached to the stairs. Fix any carpet that is loose or worn. Avoid having throw rugs at the  top or bottom of the stairs. If you do have throw rugs, attach them to the floor with carpet tape. Make sure that you have a light switch at the top of the stairs and the bottom of the stairs. If you do not have them, ask someone to add them for you. What else can I do to help prevent falls? Wear shoes that: Do not have high heels. Have rubber bottoms. Are comfortable and fit you well. Are closed at the toe. Do not wear sandals. If you use a stepladder: Make sure that it is fully opened. Do not climb a closed stepladder. Make sure that both sides of the stepladder are locked into place. Ask someone to hold it for you, if possible. Clearly mark and make sure that you can  see: Any grab bars or handrails. First and last steps. Where the edge of each step is. Use tools that help you move around (mobility aids) if they are needed. These include: Canes. Walkers. Scooters. Crutches. Turn on the lights when you go into a dark area. Replace any light bulbs as soon as they burn out. Set up your furniture so you have a clear path. Avoid moving your furniture around. If any of your floors are uneven, fix them. If there are any pets around you, be aware of where they are. Review your medicines with your doctor. Some medicines can make you feel dizzy. This can increase your chance of falling. Ask your doctor what other things that you can do to help prevent falls. This information is not intended to replace advice given to you by your health care provider. Make sure you discuss any questions you have with your health care provider. Document Released: 04/04/2009 Document Revised: 11/14/2015 Document Reviewed: 07/13/2014 Elsevier Interactive Patient Education  2017 [employer].

## 2020-12-04 NOTE — Progress Notes (Signed)
Subjective:  CC: DM f/u  Alisha Matthews is a 70 y.o. female here for follow-up of diabetes.   Alisha Matthews does not routinely monitor her sugars.  Medications include: Metformin XR 500 mg bid Diet is healthy.  Exercise: walking, stair stepper, core workout, UE stretches/exercises No CP or SOB.  Anxiety/depression Pt is currently being treated with Paxil 20 mg/d, Xanax prn.  Reports doing OK since treatment. No thoughts of harming self or others. No self-medication with alcohol, prescription drugs or illicit drugs. Pt is not following with a counselor/psychologist.  Hyperlipidemia Patient presents for dyslipidemia follow up. Currently being treated with Lipitor and compliance with treatment thus far has been good. She denies myalgias. Diet/exercise as above.  The patient is not known to have coexisting coronary artery disease.  Past Medical History:  Diagnosis Date   Anemia, unspecified    Anxiety    Arthritis    Asthma    mild exercise induced asthma as a child    Barrett's esophagus    Breast cancer ([place])    right breast invasive ductal carcinoma    Breast fibrocystic disorder    Bronchitis    Chicken pox    Constipation, chronic    Depressive disorder, not elsewhere classified    Diabetes mellitus    borderline   Exercise induced bronchospasm    Genital warts    GERD (gastroesophageal reflux disease)    Hyperlipidemia    Infected postoperative breast seroma    Insomnia, unspecified    Migraine, unspecified, without mention of intractable migraine without mention of status migrainosus    Obesity, unspecified    Osteoarthrosis, unspecified whether generalized or localized, ankle and foot    ankle/foot   Personal history of colonic polyps    Symptomatic menopausal or female climacteric states      Related testing: Retinal exam: Don Pneumovax: done  Objective:  BP (!) 144/72   Pulse 75   Temp 98.8 F (37.1 C) (Oral)   Ht 5\' 3"  (1.6 m)   Wt 145 lb 8 oz (66 kg)    LMP 06/22/2000   SpO2 99%   BMI 25.77 kg/m  General:  Well developed, well nourished, in no apparent distress Skin:  Warm, no pallor or diaphoresis Head:  Normocephalic, atraumatic Eyes:  Pupils equal and round, sclera anicteric without injection  Lungs:  CTAB, no access msc use Cardio:  RRR, no bruits, no LE edema Musculoskeletal:  Symmetrical muscle groups noted without atrophy or deformity Neuro:  Sensation intact to pinprick on feet Psych: Age appropriate judgment and insight  Assessment:   Type 2 diabetes mellitus with hyperglycemia, without long-term current use of insulin (HCC) - Plan: Comprehensive metabolic panel, Lipid panel, Hemoglobin A1c, Microalbumin / creatinine urine ratio  Anxiety and depression - Plan: busPIRone (BUSPAR) 7.5 MG tablet  Pure hypercholesterolemia  Encounter for long-term (current) use of high-risk medication - Plan: DRUG MONITORING, PANEL 8 WITH CONFIRMATION, URINE   Plan:   Chronic, stable. Cont metformin XR 500 mg bid. Counseled on diet and exercise. Chronic, stable. Cont Xanax prn. Cont Paxil 20 mg/d, trazodone 25-50 mg prn.  Chronic, stable. Cont Lipitor 20 mg/d.   Declines DEXA scan as she would not take medicine were she osteoporotic.  Shingrix info given.  F/u in 6 mo. The patient voiced understanding and agreement to the plan.  [place], DO 12/04/20 10:34 AM

## 2020-12-07 LAB — DRUG MONITORING, PANEL 8 WITH CONFIRMATION, URINE
6 Acetylmorphine: NEGATIVE ng/mL (ref <10)
Alcohol Metabolites: NEGATIVE ng/mL
Alphahydroxyalprazolam: 52 ng/mL — ABNORMAL HIGH (ref <25)
Alphahydroxymidazolam: NEGATIVE ng/mL (ref <50)
Alphahydroxytriazolam: NEGATIVE ng/mL (ref <50)
Aminoclonazepam: NEGATIVE ng/mL (ref <25)
Amphetamines: NEGATIVE ng/mL (ref <500)
Benzodiazepines: POSITIVE ng/mL — AB (ref <100)
Buprenorphine, Urine: NEGATIVE ng/mL (ref <5)
Cocaine Metabolite: NEGATIVE ng/mL (ref <150)
Creatinine: 147.2 mg/dL
Hydroxyethylflurazepam: NEGATIVE ng/mL (ref <50)
Lorazepam: NEGATIVE ng/mL (ref <50)
MDMA: NEGATIVE ng/mL (ref <500)
Marijuana Metabolite: NEGATIVE ng/mL (ref <20)
Nordiazepam: NEGATIVE ng/mL (ref <50)
Opiates: NEGATIVE ng/mL (ref <100)
Oxazepam: NEGATIVE ng/mL (ref <50)
Oxidant: NEGATIVE ug/mL
Oxycodone: NEGATIVE ng/mL (ref <100)
Temazepam: NEGATIVE ng/mL (ref <50)
pH: 5.7 (ref 4.5–9.0)

## 2020-12-07 LAB — DM TEMPLATE

## 2020-12-22 DIAGNOSIS — H9319 Tinnitus, unspecified ear: Secondary | ICD-10-CM | POA: Diagnosis not present

## 2020-12-22 DIAGNOSIS — H6990 Unspecified Eustachian tube disorder, unspecified ear: Secondary | ICD-10-CM | POA: Diagnosis not present

## 2020-12-30 ENCOUNTER — Other Ambulatory Visit: Payer: Self-pay

## 2020-12-30 ENCOUNTER — Encounter: Payer: Self-pay | Admitting: Family Medicine

## 2020-12-30 ENCOUNTER — Ambulatory Visit (INDEPENDENT_AMBULATORY_CARE_PROVIDER_SITE_OTHER): Payer: Medicare Other | Admitting: Family Medicine

## 2020-12-30 VITALS — BP 118/80 | HR 80 | Temp 98.4°F | Ht 63.0 in | Wt 147.0 lb

## 2020-12-30 DIAGNOSIS — H93A1 Pulsatile tinnitus, right ear: Secondary | ICD-10-CM

## 2020-12-30 DIAGNOSIS — M79672 Pain in left foot: Secondary | ICD-10-CM | POA: Diagnosis not present

## 2020-12-30 NOTE — Patient Instructions (Signed)
Someone will reach out regarding your scan.   Stay in the flat shoe for now. OK to wear tennis shoes if they are helpful. Listen to your body.  Ice/cold pack over area for 10-15 min twice daily.  OK to take Tylenol 1000 mg (2 extra strength tabs) or 975 mg (3 regular strength tabs) every 6 hours as needed.  Let us know if you need anything.

## 2020-12-30 NOTE — Progress Notes (Signed)
Chief Complaint  Patient presents with   Follow-up    Subjective: Patient is a 70 y.o. female here for f/u.  10 d ago started having whooshing in her ear.  It lines up with her pulse.  Went to ED and dx'd w ETD. Hearing is unchanged. She started Flonase that did not help.  She is not having any fevers, stuffy nose, drainage from your ears, and has not increased caffeine intake.  L foot pain She was carrying in kitty litter and missed a step and lunged forward landing on her R foot. Has some bruising, no swelling. Happened 4 d ago. Has been using a flat sandal that does help.   Past Medical History:  Diagnosis Date   Anemia, unspecified    Anxiety    Arthritis    Asthma    mild exercise induced asthma as a child    Barrett's esophagus    Breast cancer ([place])    right breast invasive ductal carcinoma    Breast fibrocystic disorder    Bronchitis    Chicken pox    Constipation, chronic    Depressive disorder, not elsewhere classified    Diabetes mellitus    borderline   Exercise induced bronchospasm    Genital warts    GERD (gastroesophageal reflux disease)    Hyperlipidemia    Infected postoperative breast seroma    Insomnia, unspecified    Migraine, unspecified, without mention of intractable migraine without mention of status migrainosus    Obesity, unspecified    Osteoarthrosis, unspecified whether generalized or localized, ankle and foot    ankle/foot   Personal history of colonic polyps    Symptomatic menopausal or female climacteric states     Objective: BP 118/80   Pulse 80   Temp 98.4 F (36.9 C) (Oral)   Ht 5\' 3"  (1.6 m)   Wt 147 lb (66.7 kg)   LMP 06/22/2000   SpO2 99%   BMI 26.04 kg/m  General: Awake, appears stated age HEENT: MMM, EOMi, ears negative bilaterally Heart: RRR, no murmurs, no bruits Lungs: CTAB, no rales, wheezes or rhonchi. No accessory muscle use MSK: Mild ttp over distal 1st and 2nd MT on L foot; +ecchymosis; no edema, erythema,  fluctuance, drainage Neuro: Gait is normal Psych: Age appropriate judgment and insight, normal affect and mood  Assessment and Plan: Pulsatile tinnitus of right ear - Plan: MR Angiogram Head Wo Contrast  Left foot pain  New diagnosis, uncertain prognosis.  Check MRI. New, self-limited.  Continue flat sandal as needed, ice, Tylenol, rest. The patient voiced understanding and agreement to the plan.  [place], DO 12/30/20  11:54 AM

## 2021-01-09 DIAGNOSIS — Z23 Encounter for immunization: Secondary | ICD-10-CM | POA: Diagnosis not present

## 2021-01-18 ENCOUNTER — Other Ambulatory Visit: Payer: Self-pay | Admitting: Family Medicine

## 2021-01-18 DIAGNOSIS — F4310 Post-traumatic stress disorder, unspecified: Secondary | ICD-10-CM

## 2021-03-04 DIAGNOSIS — H40013 Open angle with borderline findings, low risk, bilateral: Secondary | ICD-10-CM | POA: Diagnosis not present

## 2021-03-04 DIAGNOSIS — H25813 Combined forms of age-related cataract, bilateral: Secondary | ICD-10-CM | POA: Diagnosis not present

## 2021-03-06 DIAGNOSIS — Z23 Encounter for immunization: Secondary | ICD-10-CM | POA: Diagnosis not present

## 2021-03-11 ENCOUNTER — Ambulatory Visit (INDEPENDENT_AMBULATORY_CARE_PROVIDER_SITE_OTHER): Payer: Medicare Other | Admitting: Family Medicine

## 2021-03-11 ENCOUNTER — Other Ambulatory Visit: Payer: Self-pay

## 2021-03-11 ENCOUNTER — Encounter: Payer: Self-pay | Admitting: Family Medicine

## 2021-03-11 VITALS — BP 112/68 | HR 87 | Temp 98.4°F | Ht 63.0 in | Wt 146.4 lb

## 2021-03-11 DIAGNOSIS — H6591 Unspecified nonsuppurative otitis media, right ear: Secondary | ICD-10-CM | POA: Diagnosis not present

## 2021-03-11 DIAGNOSIS — J3489 Other specified disorders of nose and nasal sinuses: Secondary | ICD-10-CM | POA: Diagnosis not present

## 2021-03-11 MED ORDER — PREDNISONE 20 MG PO TABS: 40.0000 mg | ORAL_TABLET | Freq: Every day | ORAL | 0 refills | Status: AC

## 2021-03-11 MED ORDER — GLIPIZIDE 5 MG PO TABS: ORAL_TABLET | ORAL | 0 refills | Status: DC

## 2021-03-11 NOTE — Progress Notes (Signed)
Chief Complaint  Patient presents with   Follow-up    Subjective: Patient is a 70 y.o. female here for f/u.  Patient here for follow-up for 2 months of whooshing in her right ear, fullness, and right-sided sinus pain.  She has associated dental pain on the right side as well.  She is not having any fevers, drainage, jaw pain, coughing, wheezing, or shortness of breath.  She has been using Flonase for the past 10 days with little relief.  No sick contacts.  An MRA was ordered 2 months ago but her pulsatile whooshing had resolved.  She does not follow with ENT.  Past Medical History:  Diagnosis Date   Anemia, unspecified    Anxiety    Arthritis    Asthma    mild exercise induced asthma as a child    Barrett's esophagus    Breast cancer ([place])    right breast invasive ductal carcinoma    Breast fibrocystic disorder    Bronchitis    Chicken pox    Constipation, chronic    Depressive disorder, not elsewhere classified    Diabetes mellitus    borderline   Exercise induced bronchospasm    Genital warts    GERD (gastroesophageal reflux disease)    Hyperlipidemia    Infected postoperative breast seroma    Insomnia, unspecified    Migraine, unspecified, without mention of intractable migraine without mention of status migrainosus    Obesity, unspecified    Osteoarthrosis, unspecified whether generalized or localized, ankle and foot    ankle/foot   Personal history of colonic polyps    Symptomatic menopausal or female climacteric states     Objective: BP 112/68   Pulse 87   Temp 98.4 F (36.9 C) (Oral)   Ht 5\' 3"  (1.6 m)   Wt 146 lb 6 oz (66.4 kg)   LMP 06/22/2000   SpO2 99%   BMI 25.93 kg/m  General: Awake, appears stated age HEENT: MMM, EOMi, serous fluid noted behind the right TM, normal left TM, canals are patent without discharge, nares are patent without discharge bilaterally, no tenderness to palpation over the sinuses bilaterally Heart: RRR Lungs: CTAB, no rales,  wheezes or rhonchi. No accessory muscle use Psych: Age appropriate judgment and insight, normal affect and mood  Assessment and Plan: Fluid level behind tympanic membrane of right ear - Plan: predniSONE (DELTASONE) 20 MG tablet  Sinus pain - Plan: predniSONE (DELTASONE) 20 MG tablet  5-day prednisone burst 40 mg daily.  Glipizide 5 mg daily for every dose of prednisone.  If this does not improve symptoms, she will send me a message in 1 week.  I will send in 7 days of doxycycline and refer her to the ENT team.  Follow-up as originally scheduled. The patient voiced understanding and agreement to the plan.  I spent 30 minutes with the patient discussing the above and reviewing her chart and sending the visit.  [place], DO 03/11/21  3:46 PM

## 2021-03-11 NOTE — Patient Instructions (Signed)
Send a message in a week if you aren't turning the corner.   Let us know if you need anything.

## 2021-03-12 ENCOUNTER — Other Ambulatory Visit: Payer: Self-pay | Admitting: Family Medicine

## 2021-03-12 MED ORDER — ONETOUCH DELICA PLUS LANCET33G MISC: 3 refills | Status: DC

## 2021-03-12 MED ORDER — ONETOUCH VERIO VI STRP: ORAL_STRIP | 3 refills | Status: DC

## 2021-03-12 MED ORDER — ONETOUCH VERIO W/DEVICE KIT: PACK | 0 refills | Status: DC

## 2021-03-13 ENCOUNTER — Other Ambulatory Visit: Payer: Self-pay

## 2021-03-13 ENCOUNTER — Telehealth: Payer: Self-pay | Admitting: Family Medicine

## 2021-03-13 MED ORDER — ACCU-CHEK GUIDE ME W/DEVICE KIT: PACK | 0 refills | Status: DC

## 2021-03-17 ENCOUNTER — Other Ambulatory Visit: Payer: Self-pay | Admitting: Family Medicine

## 2021-03-17 DIAGNOSIS — H93A1 Pulsatile tinnitus, right ear: Secondary | ICD-10-CM

## 2021-03-17 MED ORDER — ACCU-CHEK GUIDE ME W/DEVICE KIT: PACK | 0 refills | Status: DC

## 2021-03-20 ENCOUNTER — Telehealth: Payer: Self-pay | Admitting: Family Medicine

## 2021-03-20 NOTE — Telephone Encounter (Signed)
Patient is calling with concerns about scheduling, she states that it is a lot to talk about and would just want to talk to the supervisor. She would like a call back to [phone], to discuss her issues. Please advice.

## 2021-03-21 ENCOUNTER — Ambulatory Visit
Admission: RE | Admit: 2021-03-21 | Discharge: 2021-03-21 | Disposition: A | Discharge status: Home / Self Care | Payer: Medicare Other | Source: Ambulatory Visit | Attending: Family Medicine | Admitting: Family Medicine

## 2021-03-21 DIAGNOSIS — H93A9 Pulsatile tinnitus, unspecified ear: Secondary | ICD-10-CM | POA: Diagnosis not present

## 2021-03-21 DIAGNOSIS — R42 Dizziness and giddiness: Secondary | ICD-10-CM | POA: Diagnosis not present

## 2021-03-21 DIAGNOSIS — H93A1 Pulsatile tinnitus, right ear: Secondary | ICD-10-CM

## 2021-03-29 ENCOUNTER — Other Ambulatory Visit: Payer: Self-pay | Admitting: Family Medicine

## 2021-03-29 DIAGNOSIS — F4310 Post-traumatic stress disorder, unspecified: Secondary | ICD-10-CM

## 2021-03-31 NOTE — Telephone Encounter (Signed)
Last OV--03/11/21 Last RF--#30 with 3 refills on 11/25/20

## 2021-04-14 DIAGNOSIS — H93A1 Pulsatile tinnitus, right ear: Secondary | ICD-10-CM | POA: Diagnosis not present

## 2021-04-14 DIAGNOSIS — H903 Sensorineural hearing loss, bilateral: Secondary | ICD-10-CM | POA: Diagnosis not present

## 2021-04-23 ENCOUNTER — Other Ambulatory Visit: Payer: Self-pay | Admitting: Family Medicine

## 2021-04-23 DIAGNOSIS — F4310 Post-traumatic stress disorder, unspecified: Secondary | ICD-10-CM

## 2021-05-06 NOTE — Telephone Encounter (Signed)
error 

## 2021-05-19 ENCOUNTER — Other Ambulatory Visit: Payer: Self-pay | Admitting: Family Medicine

## 2021-05-19 DIAGNOSIS — F4310 Post-traumatic stress disorder, unspecified: Secondary | ICD-10-CM

## 2021-05-19 DIAGNOSIS — M25562 Pain in left knee: Secondary | ICD-10-CM | POA: Diagnosis not present

## 2021-05-19 DIAGNOSIS — M25552 Pain in left hip: Secondary | ICD-10-CM | POA: Diagnosis not present

## 2021-05-30 DIAGNOSIS — H25811 Combined forms of age-related cataract, right eye: Secondary | ICD-10-CM | POA: Diagnosis not present

## 2021-06-04 ENCOUNTER — Other Ambulatory Visit: Payer: Self-pay | Admitting: Family Medicine

## 2021-06-04 DIAGNOSIS — F4310 Post-traumatic stress disorder, unspecified: Secondary | ICD-10-CM

## 2021-06-05 ENCOUNTER — Encounter: Payer: Self-pay | Admitting: Family Medicine

## 2021-06-11 ENCOUNTER — Encounter: Payer: Self-pay | Admitting: Family Medicine

## 2021-06-11 ENCOUNTER — Ambulatory Visit (INDEPENDENT_AMBULATORY_CARE_PROVIDER_SITE_OTHER): Payer: Medicare Other | Admitting: Family Medicine

## 2021-06-11 VITALS — BP 108/80 | HR 77 | Temp 98.6°F | Ht 63.0 in | Wt 140.2 lb

## 2021-06-11 DIAGNOSIS — F419 Anxiety disorder, unspecified: Secondary | ICD-10-CM

## 2021-06-11 DIAGNOSIS — F32A Depression, unspecified: Secondary | ICD-10-CM

## 2021-06-11 DIAGNOSIS — E1165 Type 2 diabetes mellitus with hyperglycemia: Secondary | ICD-10-CM

## 2021-06-11 LAB — COMPREHENSIVE METABOLIC PANEL
ALT: 17 U/L (ref 0–35)
AST: 23 U/L (ref 0–37)
Albumin: 4.1 g/dL (ref 3.5–5.2)
Alkaline Phosphatase: 58 U/L (ref 39–117)
BUN: 11 mg/dL (ref 6–23)
CO2: 26 mEq/L (ref 19–32)
Calcium: 9.3 mg/dL (ref 8.4–10.5)
Chloride: 103 mEq/L (ref 96–112)
Creatinine, Ser: 0.65 mg/dL (ref 0.40–1.20)
GFR: 87.89 mL/min (ref >60.00)
Glucose, Bld: 91 mg/dL (ref 70–99)
Potassium: 4 mEq/L (ref 3.5–5.1)
Sodium: 137 mEq/L (ref 135–145)
Total Bilirubin: 0.4 mg/dL (ref 0.2–1.2)
Total Protein: 6.4 g/dL (ref 6.0–8.3)

## 2021-06-11 LAB — LIPID PANEL
Cholesterol: 191 mg/dL (ref 0–200)
HDL: 85.9 mg/dL (ref >39.00)
LDL Cholesterol: 84 mg/dL (ref 0–99)
NonHDL: 104.73
Total CHOL/HDL Ratio: 2
Triglycerides: 102 mg/dL (ref 0.0–149.0)
VLDL: 20.4 mg/dL (ref 0.0–40.0)

## 2021-06-11 LAB — HEMOGLOBIN A1C: Hgb A1c MFr Bld: 6.2 % (ref 4.6–6.5)

## 2021-06-11 MED ORDER — MIRTAZAPINE 7.5 MG PO TABS: 7.5000 mg | ORAL_TABLET | Freq: Every day | ORAL | 2 refills | Status: DC

## 2021-06-11 NOTE — Progress Notes (Signed)
Subjective:   Chief Complaint  Patient presents with   Follow-up    6 month    Alisha Matthews is a 70 y.o. female here for follow-up of diabetes.   Alisha Matthews does not routinely monitor her sugars.  Patient does not require insulin.   Medications include: Metformin XR 500 mg bid Diet is fair.  Exercise: stair stepper No CP or SOB.  Anxiety/PTSD BuSpar was not helpful. Taking Paxil 20 mg/d. Has not had AE's, reports compliance. She has lost 6 lbs due to poor appetite. No SI or HI. No self medication. She is not following w a counselor or psychologist.   Past Medical History:  Diagnosis Date   Anemia, unspecified    Anxiety    Arthritis    Asthma    mild exercise induced asthma as a child    Barrett's esophagus    Breast cancer ([place])    right breast invasive ductal carcinoma    Breast fibrocystic disorder    Bronchitis    Chicken pox    Constipation, chronic    Depressive disorder, not elsewhere classified    Diabetes mellitus    borderline   Exercise induced bronchospasm    Genital warts    GERD (gastroesophageal reflux disease)    Hyperlipidemia    Infected postoperative breast seroma    Insomnia, unspecified    Migraine, unspecified, without mention of intractable migraine without mention of status migrainosus    Obesity, unspecified    Osteoarthrosis, unspecified whether generalized or localized, ankle and foot    ankle/foot   Personal history of colonic polyps    Symptomatic menopausal or female climacteric states      Related testing: Retinal exam: Done Pneumovax: done  Objective:  BP 108/80    Pulse 77    Temp 98.6 F (37 C) (Oral)    Ht 5\' 3"  (1.6 m)    Wt 140 lb 4 oz (63.6 kg)    LMP 06/22/2000    SpO2 98%    BMI 24.84 kg/m  General:  Well developed, well nourished, in no apparent distress Skin:  Warm, no pallor or diaphoresis Head:  Normocephalic, atraumatic Eyes:  Pupils equal and round, sclera anicteric without injection  Lungs:  CTAB, no access msc  use Cardio:  RRR, no bruits, no LE edema Psych: Age appropriate judgment and insight  Assessment:   Type 2 diabetes mellitus with hyperglycemia, without long-term current use of insulin (HCC)   Plan:   Chronic, stable. Cont metformin XR 500 mg bid. Counseled on diet and exercise. Chronic, unstable. Cont Paxil 20 mg/d. Add Remeron 7.5 mg qhs. F/u in 1 mo if no better.  F/u in 6 mo. The patient voiced understanding and agreement to the plan.  [place], DO 06/11/21 10:54 AM

## 2021-06-11 NOTE — Patient Instructions (Addendum)
Give [place] 2-3 business days to get the results of your labs back.   Keep the diet clean and stay active.  I recommend getting the updated bivalent covid vaccination booster at your convenience.   Let us know if you need anything.

## 2021-06-19 DIAGNOSIS — Z1231 Encounter for screening mammogram for malignant neoplasm of breast: Secondary | ICD-10-CM | POA: Diagnosis not present

## 2021-06-19 LAB — HM MAMMOGRAPHY

## 2021-06-20 ENCOUNTER — Encounter: Payer: Self-pay | Admitting: Family Medicine

## 2021-06-25 DIAGNOSIS — H2512 Age-related nuclear cataract, left eye: Secondary | ICD-10-CM | POA: Diagnosis not present

## 2021-06-27 DIAGNOSIS — H25812 Combined forms of age-related cataract, left eye: Secondary | ICD-10-CM | POA: Diagnosis not present

## 2021-07-02 ENCOUNTER — Other Ambulatory Visit: Payer: Self-pay | Admitting: Family Medicine

## 2021-07-02 DIAGNOSIS — K219 Gastro-esophageal reflux disease without esophagitis: Secondary | ICD-10-CM

## 2021-07-02 DIAGNOSIS — E1165 Type 2 diabetes mellitus with hyperglycemia: Secondary | ICD-10-CM

## 2021-07-24 ENCOUNTER — Other Ambulatory Visit: Payer: Self-pay | Admitting: Family Medicine

## 2021-07-24 DIAGNOSIS — Z961 Presence of intraocular lens: Secondary | ICD-10-CM | POA: Diagnosis not present

## 2021-07-24 DIAGNOSIS — E1165 Type 2 diabetes mellitus with hyperglycemia: Secondary | ICD-10-CM

## 2021-07-24 DIAGNOSIS — E7800 Pure hypercholesterolemia, unspecified: Secondary | ICD-10-CM

## 2021-07-24 DIAGNOSIS — H5213 Myopia, bilateral: Secondary | ICD-10-CM | POA: Diagnosis not present

## 2021-07-30 ENCOUNTER — Telehealth: Payer: Self-pay | Admitting: Family Medicine

## 2021-07-30 ENCOUNTER — Other Ambulatory Visit: Payer: Self-pay | Admitting: Family Medicine

## 2021-07-30 DIAGNOSIS — F4310 Post-traumatic stress disorder, unspecified: Secondary | ICD-10-CM

## 2021-07-30 MED ORDER — TRAZODONE HCL 50 MG PO TABS: ORAL_TABLET | ORAL | 2 refills | Status: DC

## 2021-07-30 NOTE — Telephone Encounter (Signed)
Last OV--06/11/2021 Last RF--03/31/21

## 2021-07-30 NOTE — Telephone Encounter (Signed)
Medication: traZODone (DESYREL) 50 MG tablet   Has the patient contacted their pharmacy? Yes.     Preferred Pharmacy: [facility], [address]  [place], [place] [address]  Phone:  [phone]  Fax:  [phone]

## 2021-08-03 DIAGNOSIS — J209 Acute bronchitis, unspecified: Secondary | ICD-10-CM | POA: Diagnosis not present

## 2021-08-03 DIAGNOSIS — J0100 Acute maxillary sinusitis, unspecified: Secondary | ICD-10-CM | POA: Diagnosis not present

## 2021-08-20 ENCOUNTER — Other Ambulatory Visit: Payer: Self-pay | Admitting: Family Medicine

## 2021-08-20 DIAGNOSIS — F4310 Post-traumatic stress disorder, unspecified: Secondary | ICD-10-CM

## 2021-08-21 ENCOUNTER — Other Ambulatory Visit: Payer: Self-pay | Admitting: Family Medicine

## 2021-08-21 DIAGNOSIS — F4310 Post-traumatic stress disorder, unspecified: Secondary | ICD-10-CM

## 2021-09-08 ENCOUNTER — Encounter: Payer: Self-pay | Admitting: Gastroenterology

## 2021-09-10 DIAGNOSIS — Z96611 Presence of right artificial shoulder joint: Secondary | ICD-10-CM | POA: Diagnosis not present

## 2021-09-10 DIAGNOSIS — M19012 Primary osteoarthritis, left shoulder: Secondary | ICD-10-CM | POA: Diagnosis not present

## 2021-09-17 ENCOUNTER — Other Ambulatory Visit: Payer: Self-pay | Admitting: Orthopedic Surgery

## 2021-09-23 ENCOUNTER — Other Ambulatory Visit: Payer: Self-pay | Admitting: Orthopedic Surgery

## 2021-09-24 ENCOUNTER — Other Ambulatory Visit: Payer: Self-pay | Admitting: Orthopedic Surgery

## 2021-09-24 DIAGNOSIS — M25512 Pain in left shoulder: Secondary | ICD-10-CM

## 2021-09-26 ENCOUNTER — Ambulatory Visit
Admission: RE | Admit: 2021-09-26 | Discharge: 2021-09-26 | Disposition: A | Discharge status: Home / Self Care | Payer: Medicare Other | Source: Ambulatory Visit | Attending: Orthopedic Surgery | Admitting: Orthopedic Surgery

## 2021-09-26 DIAGNOSIS — M25512 Pain in left shoulder: Secondary | ICD-10-CM | POA: Diagnosis not present

## 2021-09-29 ENCOUNTER — Encounter: Payer: Self-pay | Admitting: Family Medicine

## 2021-09-29 ENCOUNTER — Other Ambulatory Visit: Payer: Self-pay | Admitting: Family Medicine

## 2021-09-29 DIAGNOSIS — M25511 Pain in right shoulder: Secondary | ICD-10-CM

## 2021-09-29 MED ORDER — DICLOFENAC SODIUM 75 MG PO TBEC: 75.0000 mg | DELAYED_RELEASE_TABLET | Freq: Two times a day (BID) | ORAL | 3 refills | Status: DC

## 2021-09-29 NOTE — Patient Instructions (Signed)
DUE TO COVID-19 ONLY ONE VISITOR  (aged 73 and older)  IS ALLOWED TO COME WITH YOU AND STAY IN THE WAITING ROOM ONLY DURING PRE OP AND PROCEDURE.   ?**NO VISITORS ARE ALLOWED IN THE SHORT STAY AREA OR RECOVERY ROOM!!** ? ?IF YOU WILL BE ADMITTED INTO THE HOSPITAL YOU ARE ALLOWED ONLY TWO SUPPORT PEOPLE DURING VISITATION HOURS ONLY (7 AM -8PM)   ?The support person(s) must pass our screening, gel in and out, and wear a mask at all times, including in the patient?s room. ?Patients must also wear a mask when staff or their support person are in the room. ?Visitors GUEST BADGE MUST BE WORN VISIBLY  ?One adult visitor may remain with you overnight and MUST be in the room by 8 P.M. ?  ? ? Your procedure is scheduled on: 10/09/21 ? ? Report to [facility] Main Entrance ? ?  Report to Short stay at : 5:15 AM ? ? Call this number if you have problems the morning of surgery [phone] ? ? Do not eat food :After Midnight. ? ? After Midnight you may have the following liquids until: 4:30 AM DAY OF SURGERY ? ?Water ?Black Coffee (sugar ok, NO MILK/CREAM OR CREAMERS)  ?Tea (sugar ok, NO MILK/CREAM OR CREAMERS) regular and decaf                             ?Plain Jell-O (NO RED)                                           ?Fruit ices (not with fruit pulp, NO RED)                                     ?Popsicles (NO RED)                                                                  ?Juice: apple, WHITE grape, WHITE cranberry ?Sports drinks like Gatorade (NO RED) ?Clear broth(vegetable,chicken,beef) ? ?             ?Drink Gatorade  drink AT :4:30 AM the day of surgery.    ?  ?The day of surgery:  ?Drink ONE (1) Pre-Surgery Clear Ensure or G2 at AM the morning of surgery. Drink in one sitting. Do not sip.  ?This drink was given to you during your hospital  ?pre-op appointment visit. ?Nothing else to drink after completing the  ?Pre-Surgery Clear Ensure or G2. ?  ?       If you have questions, please contact your surgeon?s  office.  ? ?Oral Hygiene is also important to reduce your risk of infection.                                    ?Remember - BRUSH YOUR TEETH THE MORNING OF SURGERY WITH YOUR REGULAR TOOTHPASTE ? ? Do NOT smoke after Midnight ? ? Take these medicines the morning of  surgery with A SIP OF WATER: Paxil,esomeprazole,misoprostol. ?How to Manage Your Diabetes ?Before and After Surgery ? ?Why is it important to control my blood sugar before and after surgery? ?Improving blood sugar levels before and after surgery helps healing and can limit problems. ?A way of improving blood sugar control is eating a healthy diet by: ? Eating less sugar and carbohydrates ? Increasing activity/exercise ? Talking with your doctor about reaching your blood sugar goals ?High blood sugars (greater than 180 mg/dL) can raise your risk of infections and slow your recovery, so you will need to focus on controlling your diabetes during the weeks before surgery. ?Make sure that the doctor who takes care of your diabetes knows about your planned surgery including the date and location. ? ?How do I manage my blood sugar before surgery? ?Check your blood sugar at least 4 times a day, starting 2 days before surgery, to make sure that the level is not too high or low. ?Check your blood sugar the morning of your surgery when you wake up and every 2 hours until you get to the Short Stay unit. ?If your blood sugar is less than 70 mg/dL, you will need to treat for low blood sugar: ?Do not take insulin. ?Treat a low blood sugar (less than 70 mg/dL) with ? cup of clear juice (cranberry or apple), 4 glucose tablets, OR glucose gel. ?Recheck blood sugar in 15 minutes after treatment (to make sure it is greater than 70 mg/dL). If your blood sugar is not greater than 70 mg/dL on recheck, call [phone] for further instructions. ?Report your blood sugar to the short stay nurse when you get to Short Stay. ? ?If you are admitted to the hospital after surgery: ?Your  blood sugar will be checked by the staff and you will probably be given insulin after surgery (instead of oral diabetes medicines) to make sure you have good blood sugar levels. ?The goal for blood sugar control after surgery is 80-180 mg/dL. ? ? ?WHAT DO I DO ABOUT MY DIABETES MEDICATION? ? ?Do not take oral diabetes medicines (pills) the morning of surgery. ? ?THE NIGHT BEFORE SURGERY, take metformin as usual.        ? ?THE MORNING OF SURGERY, DO NOT TAKE ANY ORAL DIABETIC MEDICATIONS DAY OF YOUR SURGERY ? ?Bring CPAP mask and tubing day of surgery. ?                  ?           You may not have any metal on your body including hair pins, jewelry, and body piercing ? ?           Do not wear make-up, lotions, powders, perfumes/cologne, or deodorant ? ?Do not wear nail polish including gel and S&S, artificial/acrylic nails, or any other type of covering on natural nails including finger and toenails. If you have artificial nails, gel coating, etc. that needs to be removed by a nail salon please have this removed prior to surgery or surgery may need to be canceled/ delayed if the surgeon/ anesthesia feels like they are unable to be safely monitored.  ? ?Do not shave  48 hours prior to surgery.  ? ? Do not bring valuables to the hospital. [facility] NOT ?            RESPONSIBLE   FOR VALUABLES. ? ? Contacts, dentures or bridgework may not be worn into surgery. ? ? Bring small overnight bag  day of surgery. ?  ? Patients discharged on the day of surgery will not be allowed to drive home.  Someone NEEDS to stay with you for the first 24 hours after anesthesia. ? ? Special Instructions: Bring a copy of your healthcare power of attorney and living will documents         the day of surgery if you haven't scanned them before. ? ?            Please read over the following fact sheets you were given: IF [place] [phone] ? ?    - Preparing for  Surgery ?Before surgery, you can play an important role.  Because skin is not sterile, your skin needs to be as free of germs as possible.  You can reduce the number of germs on your skin by washing with CHG (chlorahexidine gluconate) soap before surgery.  CHG is an antiseptic cleaner which kills germs and bonds with the skin to continue killing germs even after washing. ?Please DO NOT use if you have an allergy to CHG or antibacterial soaps.  If your skin becomes reddened/irritated stop using the CHG and inform your nurse when you arrive at Short Stay. ?Do not shave (including legs and underarms) for at least 48 hours prior to the first CHG shower.  You may shave your face/neck. ?Please follow these instructions carefully: ? 1.  Shower with CHG Soap the night before surgery and the  morning of Surgery. ? 2.  If you choose to wash your hair, wash your hair first as usual with your  normal  shampoo. ? 3.  After you shampoo, rinse your hair and body thoroughly to remove the  shampoo.                           4.  Use CHG as you would any other liquid soap.  You can apply chg directly  to the skin and wash  ?                     Gently with a scrungie or clean washcloth. ? 5.  Apply the CHG Soap to your body ONLY FROM THE NECK DOWN.   Do not use on face/ open      ?                     Wound or open sores. Avoid contact with eyes, ears mouth and genitals (private parts).  ?                     Production manager,  Genitals (private parts) with your normal soap. ?            6.  Wash thoroughly, paying special attention to the area where your surgery  will be performed. ? 7.  Thoroughly rinse your body with warm water from the neck down. ? 8.  DO NOT shower/wash with your normal soap after using and rinsing off  the CHG Soap. ?               9.  Pat yourself dry with a clean towel. ?           10.  Wear clean pajamas. ?           11.  Place clean sheets on your bed the night of your first shower and  do not  sleep with pets. ?Day  of Surgery : ?Do not apply any lotions/deodorants the morning of surgery.  Please wear clean clothes to the hospital/surgery center. ? ?FAILURE TO FOLLOW THESE INSTRUCTIONS MAY RESULT IN THE CANCELLATION OF YOUR

## 2021-09-30 ENCOUNTER — Encounter (HOSPITAL_COMMUNITY): Payer: Self-pay

## 2021-09-30 ENCOUNTER — Other Ambulatory Visit: Payer: Self-pay

## 2021-09-30 ENCOUNTER — Encounter (HOSPITAL_COMMUNITY)
Admission: RE | Admit: 2021-09-30 | Discharge: 2021-09-30 | Disposition: A | Discharge status: Home / Self Care | Payer: Medicare Other | Source: Ambulatory Visit | Attending: Orthopedic Surgery | Admitting: Orthopedic Surgery

## 2021-09-30 ENCOUNTER — Ambulatory Visit (HOSPITAL_COMMUNITY)
Admission: RE | Admit: 2021-09-30 | Discharge: 2021-09-30 | Disposition: A | Discharge status: Home / Self Care | Payer: Medicare Other | Source: Ambulatory Visit | Attending: Orthopedic Surgery | Admitting: Orthopedic Surgery

## 2021-09-30 VITALS — BP 134/77 | HR 69 | Temp 99.2°F | Resp 16 | Ht 63.0 in | Wt 141.0 lb

## 2021-09-30 DIAGNOSIS — E1165 Type 2 diabetes mellitus with hyperglycemia: Secondary | ICD-10-CM

## 2021-09-30 DIAGNOSIS — Z01818 Encounter for other preprocedural examination: Secondary | ICD-10-CM | POA: Insufficient documentation

## 2021-09-30 DIAGNOSIS — M47814 Spondylosis without myelopathy or radiculopathy, thoracic region: Secondary | ICD-10-CM | POA: Diagnosis not present

## 2021-09-30 LAB — BASIC METABOLIC PANEL
Anion gap: 5 (ref 5–15)
BUN: 13 mg/dL (ref 8–23)
CO2: 25 mmol/L (ref 22–32)
Calcium: 9.2 mg/dL (ref 8.9–10.3)
Chloride: 110 mmol/L (ref 98–111)
Creatinine, Ser: 0.67 mg/dL (ref 0.44–1.00)
GFR, Estimated: >60 mL/min (ref >60)
Glucose, Bld: 86 mg/dL (ref 70–99)
Potassium: 4.3 mmol/L (ref 3.5–5.1)
Sodium: 140 mmol/L (ref 135–145)

## 2021-09-30 LAB — CBC
HCT: 31.7 % — ABNORMAL LOW (ref 36.0–46.0)
Hemoglobin: 10.1 g/dL — ABNORMAL LOW (ref 12.0–15.0)
MCH: 27.6 pg (ref 26.0–34.0)
MCHC: 31.9 g/dL (ref 30.0–36.0)
MCV: 86.6 fL (ref 80.0–100.0)
Platelets: 238 10*3/uL (ref 150–400)
RBC: 3.66 MIL/uL — ABNORMAL LOW (ref 3.87–5.11)
RDW: 14.4 % (ref 11.5–15.5)
WBC: 4.9 10*3/uL (ref 4.0–10.5)
nRBC: 0 % (ref 0.0–0.2)

## 2021-09-30 LAB — GLUCOSE, CAPILLARY: Glucose-Capillary: 78 mg/dL (ref 70–99)

## 2021-09-30 LAB — SURGICAL PCR SCREEN
MRSA, PCR: NEGATIVE
Staphylococcus aureus: NEGATIVE

## 2021-09-30 LAB — HEMOGLOBIN A1C
Hgb A1c MFr Bld: 5.8 % — ABNORMAL HIGH (ref 4.8–5.6)
Mean Plasma Glucose: 119.76 mg/dL

## 2021-09-30 NOTE — Progress Notes (Signed)
Lab. Results: Hemoglobin: 10.1 ?

## 2021-09-30 NOTE — Progress Notes (Signed)
For Short Stay: ?[place] appointment date: ?Date of COVID positive in last 90 days: ?COVID vaccine: Moderna x 4: 01/09/21 ?Bowel Prep reminder: ? ? ?For Anesthesia: ?PCP - DO: Charleston Ropes ?Cardiologist -  ? ?Chest x-ray -  ?EKG -  ?Stress Test -  ?ECHO -  ?Cardiac Cath -  ?Pacemaker/ICD device last checked: ?Pacemaker orders received: ?Device Rep notified: ? ?Spinal Cord Stimulator: ? ?Sleep Study -  ?CPAP -  ? ?Fasting Blood Sugar - N/A ?Checks Blood Sugar ___0__ times a day ?Date and result of last Hgb A1c- ? ?Blood Thinner Instructions: ?Aspirin Instructions: ?Last Dose: ? ?Activity level: Can go up a flight of stairs and activities of daily living without stopping and without chest pain and/or shortness of breath ?  Able to exercise without chest pain and/or shortness of breath ?  Unable to go up a flight of stairs without chest pain and/or shortness of breath ?   ? ?Anesthesia review:  ? ?Patient denies shortness of breath, fever, cough and chest pain at PAT appointment ? ? ?Patient verbalized understanding of instructions that were given to them at the PAT appointment. Patient was also instructed that they will need to review over the PAT instructions again at home before surgery.  ?

## 2021-10-01 ENCOUNTER — Encounter: Payer: Self-pay | Admitting: Family Medicine

## 2021-10-01 ENCOUNTER — Encounter: Payer: Self-pay | Admitting: Gastroenterology

## 2021-10-01 ENCOUNTER — Ambulatory Visit (INDEPENDENT_AMBULATORY_CARE_PROVIDER_SITE_OTHER): Payer: Medicare Other | Admitting: Family Medicine

## 2021-10-01 VITALS — BP 120/76 | HR 82 | Temp 98.6°F | Ht 63.0 in | Wt 143.0 lb

## 2021-10-01 DIAGNOSIS — Z1211 Encounter for screening for malignant neoplasm of colon: Secondary | ICD-10-CM | POA: Diagnosis not present

## 2021-10-01 DIAGNOSIS — Z01818 Encounter for other preprocedural examination: Secondary | ICD-10-CM | POA: Diagnosis not present

## 2021-10-01 NOTE — Progress Notes (Signed)
Subjective:  ? ?Chief Complaint  ?Patient presents with  ? Medical Clearance  ? ? ?Alisha Matthews  is here for a Pre-operative physical at the request of Dr. Fermin Schwab.   ?She  is having shoulder arthroplasty surgery on 10/09/21.  ? ?Personal or family hx of adverse outcome to anesthesia? No  ?Chipped, cracked, missing, or loose teeth?  Yes, missing molar on L lower  ?Decreased ROM of neck? No  ?Able to walk up 2 flights of stairs without becoming significantly short of breath or having chest pain? Yes  ? ?Revised Goldman Criteria: ?High Risk Surgery (intraperitoneal, intrathoracic, aortic): No  ?Ischemic heart disease (Prior MI, +excercise stress test, angina, nitrate use, Qwave): No  ?History of heart failure: No  ?History of cerebrovascular disease: No  ?History of diabetes: Yes  ?Insulin therapy for DM: No  ?Preoperative Cr >2.0: No  ? ?Patient Active Problem List  ? Diagnosis Date Noted  ? Type 2 diabetes mellitus with hyperglycemia, without long-term current use of insulin ([place]) 12/04/2020  ? Bilateral shoulder pain 06/07/2019  ? Gastroesophageal reflux disease without esophagitis 06/07/2019  ? Long-term use of high-risk medication 06/07/2019  ? PTSD (post-traumatic stress disorder) 01/31/2019  ? Bilateral hand pain 02/01/2017  ? Migraine 09/15/2016  ? Insomnia 09/15/2016  ? Osteopenia 06/06/2014  ? 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 Diabetes mellitus without complication ([place]) 54/65/0354  ? Anxiety and depression 08/08/2012  ? Pure hypercholesterolemia 08/08/2012  ? ?Past Medical History:  ?Diagnosis Date  ? Anemia, unspecified   ? Anxiety   ? Arthritis   ? Asthma   ? mild exercise induced asthma as a child   ? Barrett's esophagus   ? Breast cancer ([place])   ? right breast invasive ductal carcinoma   ? Breast fibrocystic disorder   ? Bronchitis   ? Chicken pox   ? Constipation, chronic   ? Depressive disorder, not elsewhere classified   ? Diabetes mellitus   ? borderline  ? Exercise induced bronchospasm   ? Genital warts   ?  GERD (gastroesophageal reflux disease)   ? Hyperlipidemia   ? Infected postoperative breast seroma   ? Insomnia, unspecified   ? Migraine, unspecified, without mention of intractable migraine without mention of status migrainosus   ? Obesity, unspecified   ? Osteoarthrosis, unspecified whether generalized or localized, ankle and foot   ? ankle/foot  ? Personal history of colonic polyps   ? Symptomatic menopausal or female climacteric states   ?  ?Past Surgical History:  ?Procedure Laterality Date  ? BREAST LUMPECTOMY WITH NEEDLE LOCALIZATION AND AXILLARY SENTINEL LYMPH NODE BX  06/08/2012  ? Procedure: BREAST LUMPECTOMY WITH NEEDLE LOCALIZATION AND AXILLARY SENTINEL LYMPH NODE BX;  Surgeon: Rolm Bookbinder, MD;  Location: [facility];  Service: General;  Laterality: Right;  ? BREAST SURGERY    ? BUNIONECTOMY    ? 3 surgeries on both feet  ? CATARACT EXTRACTION, BILATERAL    ? 2022. 2023  ? CHOLECYSTECTOMY    ? COLONOSCOPY    ? dental implant    ? EYE SURGERY    ? Orbital right eye surgery  ? FOOT SURGERY    ? Left-revision  ? JOINT REPLACEMENT    ? NASAL SEPTUM SURGERY    ? RE-EXCISION OF BREAST CANCER,SUPERIOR MARGINS  06/27/2012  ? Procedure: RE-EXCISION OF BREAST CANCER,SUPERIOR MARGINS;  Surgeon: Rolm Bookbinder, MD;  Location: [facility];  Service: General;  Laterality: Right;  ? TOTAL HIP ARTHROPLASTY    ?  left      Perthes disease  ? TOTAL KNEE ARTHROPLASTY Left 02/20/2014  ? Procedure: TOTAL KNEE ARTHROPLASTY;  Surgeon: Hessie Dibble, MD;  Location: [facility];  Service: Orthopedics;  Laterality: Left;  ? TOTAL SHOULDER ARTHROPLASTY Right 06/27/2020  ? Procedure: TOTAL SHOULDER ARTHROPLASTY;  Surgeon: Tania Ade, MD;  Location: [facility];  Service: Orthopedics;  Laterality: Right;  ?  ?Current Outpatient Medications  ?Medication Sig Dispense Refill  ? ALPRAZolam (XANAX) 0.25 MG tablet TAKE 1 TABLET BY MOUTH AT BEDTIME AS NEEDED FOR ANXIETY (Patient taking differently: Take  0.25 mg by mouth at bedtime.) 30 tablet 5  ? atorvastatin (LIPITOR) 20 MG tablet TAKE 1 TABLET BY MOUTH DAILY 90 tablet 3  ? Blood Glucose Monitoring Suppl (ACCU-CHEK GUIDE ME) w/Device KIT USE DAILY TO CHECK BLOOD SUGAR. DX E11.9 1 kit 0  ? Calcium-Magnesium-Vitamin D (CALCIUM 500 PO) Take 500 mg by mouth 3 (three) times daily.    ? clindamycin (CLEOCIN) 150 MG capsule Take 600 mg by mouth See admin instructions. Take 600 mg by mouth prior to dental procedures    ? Coenzyme Q10 (COQ10) 200 MG CAPS Take 200 mg by mouth daily.    ? diclofenac (VOLTAREN) 75 MG EC tablet Take 1 tablet (75 mg total) by mouth 2 (two) times daily. 180 tablet 3  ? esomeprazole (NEXIUM) 40 MG capsule TAKE 1 CAPSULE BY MOUTH DAILY 90 capsule 3  ? frovatriptan (FROVA) 2.5 MG tablet Take 1 tablet (2.5 mg total) by mouth as needed for migraine (max 3 tabs in 24 hours). If recurs, may repeat after 2 hours. Max of 3 tabs in 24 hours. 10 tablet 1  ? glipiZIDE (GLUCOTROL) 5 MG tablet Take 1 tab with every dose of prednisone. 30 tablet 0  ? LevOCARNitine (CARNITINE PO) Take 3,000 mg by mouth daily.     ? metFORMIN (GLUCOPHAGE-XR) 500 MG 24 hr tablet TAKE 1 TABLET BY MOUTH 2 TIMES DAILY 180 tablet 3  ? misoprostol (CYTOTEC) 200 MCG tablet TAKE 1 TABLET BY MOUTH 2 TIMES DAILY. 180 tablet 2  ? Multiple Vitamins-Minerals (ANTIOXIDANT PO) Take 1 capsule by mouth daily. Fruit    ? OVER THE COUNTER MEDICATION Take 1 capsule by mouth daily. Procast package: Elite-100 omega 3 vitamin    ? OVER THE COUNTER MEDICATION Take 1 capsule by mouth daily. Procast: Memory,brain and liver vitamin    ? OVER THE COUNTER MEDICATION Take 1 capsule by mouth daily. Circulation and vein support vitamin    ? PARoxetine (PAXIL) 20 MG tablet TAKE ONE TABLET BY MOUTH DAILY 90 tablet 0  ? Probiotic Product (PROBIOTIC DAILY PO) Take 1 capsule by mouth daily.    ? traZODone (DESYREL) 50 MG tablet TAKE 1/2 TO 1 TABLET BY MOUTH AT [facility] NEEDED FOR SLEEP (Patient taking  differently: Take 50 mg by mouth at bedtime.) 90 tablet 2  ? ? ?Allergies  ?Allergen Reactions  ? Penicillins Rash  ?  ?Family History  ?Adopted: Yes  ?Problem Relation Age of Onset  ? Hypertension Mother   ? Diabetes Mother   ? Heart failure Mother   ? Heart disease Mother   ? Hyperlipidemia Mother   ? Stroke Mother   ? Mental illness Mother   ? Cancer Maternal Grandmother 72  ?     non hodgkins lymphoma  ? Cancer Maternal Grandfather   ? Deep vein thrombosis Brother   ?  ? ?Review of Systems: ? ?Constitutional:  no fevers ?Eye:  no  recent significant change in vision ?Ear:  no hearing loss ?Nose/Mouth/Throat:  No dental complaints ?Neck/Thyroid:  no lumps or masses ?Pulmonary:  No shortness of breath ?Cardiovascular:  no chest pain ?Gastrointestinal:  no abdominal pain ?GU:  negative for dysuria ?Musculoskeletal/Extremities:  no pain ?Skin/Integumentary ROS:  no abnormal skin lesions reported ?Neurologic:  no HA ? ? ?Objective:  ? ?Vitals:  ? 10/01/21 1108  ?BP: 120/76  ?Pulse: 82  ?Temp: 98.6 ?F (37 ?C)  ?TempSrc: Oral  ?SpO2: 99%  ?Weight: 143 lb (64.9 kg)  ?Height: _0  (1.6 m)  ? ?Body mass index is 25.33 kg/m?. ? ?General:  well developed, well nourished, in no apparent distress ?Skin:  warm, no pallor or diaphoresis ?Head:  normocephalic, atraumatic ?Eyes:  pupils equal and round, sclera anicteric without injection ?Ears:  canals without lesions, TMs shiny without retraction, no obvious effusion, no erythema ?Throat/Pharynx:  lips and gingiva without lesion; tongue and uvula midline; non-inflamed pharynx; no exudates or postnasal drainage ?Neck: neck supple without adenopathy, thyromegaly, or masses, no bruits, no jugular venous distention ?Lungs:  clear to auscultation, breath sounds equal bilaterally, no respiratory distress ?Cardio:  regular rate and rhythm without murmurs ?Abdomen:  abdomen soft, nontender; bowel sounds normal; no masses, hepatomegaly or splenomegaly ?Musculoskeletal:  symmetrical  muscle groups noted without atrophy or deformity ?Extremities:  no clubbing, cyanosis, or edema, no deformities, no skin discoloration ?Neuro:  gait normal; deep tendon reflexes normal and symmetric and alert and orient

## 2021-10-09 ENCOUNTER — Encounter (HOSPITAL_COMMUNITY)
Admission: RE | Disposition: A | Discharge status: Home / Self Care | Payer: Self-pay | Source: Ambulatory Visit | Attending: Orthopedic Surgery

## 2021-10-09 ENCOUNTER — Other Ambulatory Visit: Payer: Self-pay

## 2021-10-09 ENCOUNTER — Ambulatory Visit (HOSPITAL_COMMUNITY): Payer: Medicare Other

## 2021-10-09 ENCOUNTER — Ambulatory Visit (HOSPITAL_COMMUNITY): Payer: Medicare Other | Admitting: Anesthesiology

## 2021-10-09 ENCOUNTER — Ambulatory Visit (HOSPITAL_BASED_OUTPATIENT_CLINIC_OR_DEPARTMENT_OTHER): Payer: Medicare Other | Admitting: Anesthesiology

## 2021-10-09 ENCOUNTER — Encounter (HOSPITAL_COMMUNITY): Payer: Self-pay | Admitting: Orthopedic Surgery

## 2021-10-09 ENCOUNTER — Ambulatory Visit (HOSPITAL_COMMUNITY)
Admission: RE | Admit: 2021-10-09 | Discharge: 2021-10-09 | Disposition: A | Discharge status: Home / Self Care | Payer: Medicare Other | Source: Ambulatory Visit | Attending: Orthopedic Surgery | Admitting: Orthopedic Surgery

## 2021-10-09 DIAGNOSIS — F419 Anxiety disorder, unspecified: Secondary | ICD-10-CM | POA: Insufficient documentation

## 2021-10-09 DIAGNOSIS — E119 Type 2 diabetes mellitus without complications: Secondary | ICD-10-CM | POA: Insufficient documentation

## 2021-10-09 DIAGNOSIS — K219 Gastro-esophageal reflux disease without esophagitis: Secondary | ICD-10-CM | POA: Insufficient documentation

## 2021-10-09 DIAGNOSIS — G8918 Other acute postprocedural pain: Secondary | ICD-10-CM | POA: Diagnosis not present

## 2021-10-09 DIAGNOSIS — D649 Anemia, unspecified: Secondary | ICD-10-CM | POA: Diagnosis not present

## 2021-10-09 DIAGNOSIS — M25712 Osteophyte, left shoulder: Secondary | ICD-10-CM | POA: Insufficient documentation

## 2021-10-09 DIAGNOSIS — M19012 Primary osteoarthritis, left shoulder: Secondary | ICD-10-CM | POA: Insufficient documentation

## 2021-10-09 DIAGNOSIS — M75122 Complete rotator cuff tear or rupture of left shoulder, not specified as traumatic: Secondary | ICD-10-CM | POA: Insufficient documentation

## 2021-10-09 DIAGNOSIS — M75102 Unspecified rotator cuff tear or rupture of left shoulder, not specified as traumatic: Secondary | ICD-10-CM

## 2021-10-09 DIAGNOSIS — J45909 Unspecified asthma, uncomplicated: Secondary | ICD-10-CM | POA: Insufficient documentation

## 2021-10-09 DIAGNOSIS — F32A Depression, unspecified: Secondary | ICD-10-CM | POA: Insufficient documentation

## 2021-10-09 DIAGNOSIS — Z7984 Long term (current) use of oral hypoglycemic drugs: Secondary | ICD-10-CM | POA: Insufficient documentation

## 2021-10-09 DIAGNOSIS — D759 Disease of blood and blood-forming organs, unspecified: Secondary | ICD-10-CM | POA: Insufficient documentation

## 2021-10-09 HISTORY — PX: REVERSE SHOULDER ARTHROPLASTY: SHX5054

## 2021-10-09 LAB — TYPE AND SCREEN
ABO/RH(D): AB POS
Antibody Screen: NEGATIVE

## 2021-10-09 LAB — GLUCOSE, CAPILLARY
Glucose-Capillary: 81 mg/dL (ref 70–99)
Glucose-Capillary: 112 mg/dL — ABNORMAL HIGH (ref 70–99)

## 2021-10-09 SURGERY — ARTHROPLASTY, SHOULDER, TOTAL, REVERSE
Anesthesia: General | Site: Shoulder | Laterality: Left

## 2021-10-09 MED ORDER — PROPOFOL 10 MG/ML IV BOLUS
INTRAVENOUS | Status: AC
  Filled 2021-10-09: qty 20

## 2021-10-09 MED ORDER — TRANEXAMIC ACID-NACL 1000-0.7 MG/100ML-% IV SOLN
INTRAVENOUS | Status: AC
Start: 2021-10-09 — End: 2021-10-09
  Filled 2021-10-09: qty 100

## 2021-10-09 MED ORDER — LIDOCAINE 2% (20 MG/ML) 5 ML SYRINGE
INTRAMUSCULAR | Status: DC | PRN
Start: 2021-10-09 — End: 2021-10-09
  Administered 2021-10-09: 60 mg via INTRAVENOUS

## 2021-10-09 MED ORDER — VANCOMYCIN HCL IN DEXTROSE 1-5 GM/200ML-% IV SOLN
1000.0000 mg | INTRAVENOUS | Status: AC
  Filled 2021-10-09: qty 200

## 2021-10-09 MED ORDER — TRANEXAMIC ACID-NACL 1000-0.7 MG/100ML-% IV SOLN
1000.0000 mg | INTRAVENOUS | Status: AC
  Administered 2021-10-09: 1000 mg via INTRAVENOUS

## 2021-10-09 MED ORDER — PROPOFOL 10 MG/ML IV BOLUS
INTRAVENOUS | Status: DC | PRN
  Administered 2021-10-09: 120 mg via INTRAVENOUS

## 2021-10-09 MED ORDER — OXYCODONE-ACETAMINOPHEN 5-325 MG PO TABS: 1.0000 | ORAL_TABLET | Freq: Four times a day (QID) | ORAL | 0 refills | Status: DC | PRN

## 2021-10-09 MED ORDER — OXYCODONE HCL 5 MG/5ML PO SOLN: 5.0000 mg | Freq: Once | ORAL | Status: DC | PRN

## 2021-10-09 MED ORDER — ONDANSETRON HCL 4 MG/2ML IJ SOLN
INTRAMUSCULAR | Status: AC
  Filled 2021-10-09: qty 2

## 2021-10-09 MED ORDER — WATER FOR IRRIGATION, STERILE IR SOLN
Status: DC | PRN
  Administered 2021-10-09: 2000 mL

## 2021-10-09 MED ORDER — PHENYLEPHRINE HCL-NACL 20-0.9 MG/250ML-% IV SOLN
INTRAVENOUS | Status: AC
  Filled 2021-10-09: qty 250

## 2021-10-09 MED ORDER — PHENYLEPHRINE HCL (PRESSORS) 10 MG/ML IV SOLN
INTRAVENOUS | Status: AC
  Filled 2021-10-09: qty 1

## 2021-10-09 MED ORDER — MIDAZOLAM HCL 2 MG/2ML IJ SOLN
INTRAMUSCULAR | Status: AC
  Administered 2021-10-09: 1 mg via INTRAVENOUS
  Filled 2021-10-09: qty 2

## 2021-10-09 MED ORDER — ORAL CARE MOUTH RINSE: 15.0000 mL | Freq: Once | OROMUCOSAL | Status: AC

## 2021-10-09 MED ORDER — MIDAZOLAM HCL 2 MG/2ML IJ SOLN
INTRAMUSCULAR | Status: AC
  Filled 2021-10-09: qty 2

## 2021-10-09 MED ORDER — ACETAMINOPHEN 500 MG PO TABS: 1000.0000 mg | ORAL_TABLET | Freq: Once | ORAL | Status: AC

## 2021-10-09 MED ORDER — LIDOCAINE HCL (PF) 2 % IJ SOLN
INTRAMUSCULAR | Status: AC
  Filled 2021-10-09: qty 5

## 2021-10-09 MED ORDER — FENTANYL CITRATE PF 50 MCG/ML IJ SOSY
PREFILLED_SYRINGE | INTRAMUSCULAR | Status: AC
  Administered 2021-10-09: 50 ug via INTRAVENOUS
  Filled 2021-10-09: qty 2

## 2021-10-09 MED ORDER — DEXAMETHASONE SODIUM PHOSPHATE 10 MG/ML IJ SOLN
INTRAMUSCULAR | Status: AC
  Filled 2021-10-09: qty 1

## 2021-10-09 MED ORDER — SUGAMMADEX SODIUM 200 MG/2ML IV SOLN
INTRAVENOUS | Status: DC | PRN
  Administered 2021-10-09: 150 mg via INTRAVENOUS

## 2021-10-09 MED ORDER — ROCURONIUM BROMIDE 10 MG/ML (PF) SYRINGE
PREFILLED_SYRINGE | INTRAVENOUS | Status: AC
  Filled 2021-10-09: qty 10

## 2021-10-09 MED ORDER — PHENYLEPHRINE HCL-NACL 20-0.9 MG/250ML-% IV SOLN
INTRAVENOUS | Status: DC | PRN
Start: 2021-10-09 — End: 2021-10-09
  Administered 2021-10-09: 25 ug/min via INTRAVENOUS

## 2021-10-09 MED ORDER — ACETAMINOPHEN 500 MG PO TABS
ORAL_TABLET | ORAL | Status: AC
  Administered 2021-10-09: 1000 mg via ORAL
  Filled 2021-10-09: qty 2

## 2021-10-09 MED ORDER — VANCOMYCIN HCL IN DEXTROSE 1-5 GM/200ML-% IV SOLN
INTRAVENOUS | Status: AC
  Administered 2021-10-09: 1000 mg via INTRAVENOUS
  Filled 2021-10-09: qty 200

## 2021-10-09 MED ORDER — MIDAZOLAM HCL 5 MG/5ML IJ SOLN
INTRAMUSCULAR | Status: DC | PRN
Start: 2021-10-09 — End: 2021-10-09
  Administered 2021-10-09 (×2): 1 mg via INTRAVENOUS

## 2021-10-09 MED ORDER — ONDANSETRON HCL 4 MG/2ML IJ SOLN
INTRAMUSCULAR | Status: DC | PRN
  Administered 2021-10-09: 4 mg via INTRAVENOUS

## 2021-10-09 MED ORDER — TIZANIDINE HCL 2 MG PO TABS: 2.0000 mg | ORAL_TABLET | Freq: Three times a day (TID) | ORAL | 0 refills | Status: AC | PRN

## 2021-10-09 MED ORDER — FENTANYL CITRATE PF 50 MCG/ML IJ SOSY: 50.0000 ug | PREFILLED_SYRINGE | INTRAMUSCULAR | Status: DC

## 2021-10-09 MED ORDER — LACTATED RINGERS IV SOLN: INTRAVENOUS | Status: DC

## 2021-10-09 MED ORDER — MIDAZOLAM HCL 2 MG/2ML IJ SOLN: 1.0000 mg | INTRAMUSCULAR | Status: DC

## 2021-10-09 MED ORDER — GLYCOPYRROLATE 0.2 MG/ML IJ SOLN
INTRAMUSCULAR | Status: DC | PRN
  Administered 2021-10-09: .2 mg via INTRAVENOUS

## 2021-10-09 MED ORDER — BUPIVACAINE-EPINEPHRINE (PF) 0.5% -1:200000 IJ SOLN
INTRAMUSCULAR | Status: DC | PRN
  Administered 2021-10-09: 15 mL via PERINEURAL

## 2021-10-09 MED ORDER — FENTANYL CITRATE PF 50 MCG/ML IJ SOSY: 25.0000 ug | PREFILLED_SYRINGE | INTRAMUSCULAR | Status: DC | PRN

## 2021-10-09 MED ORDER — DEXAMETHASONE SODIUM PHOSPHATE 10 MG/ML IJ SOLN
INTRAMUSCULAR | Status: DC | PRN
  Administered 2021-10-09: 10 mg via INTRAVENOUS

## 2021-10-09 MED ORDER — ROCURONIUM BROMIDE 10 MG/ML (PF) SYRINGE
PREFILLED_SYRINGE | INTRAVENOUS | Status: DC | PRN
  Administered 2021-10-09: 50 mg via INTRAVENOUS

## 2021-10-09 MED ORDER — BUPIVACAINE LIPOSOME 1.3 % IJ SUSP
INTRAMUSCULAR | Status: DC | PRN
  Administered 2021-10-09: 10 mL via PERINEURAL

## 2021-10-09 MED ORDER — CHLORHEXIDINE GLUCONATE 0.12 % MT SOLN
15.0000 mL | Freq: Once | OROMUCOSAL | Status: AC
  Administered 2021-10-09: 15 mL via OROMUCOSAL

## 2021-10-09 MED ORDER — SODIUM CHLORIDE 0.9 % IR SOLN
Status: DC | PRN
  Administered 2021-10-09 (×2): 1000 mL

## 2021-10-09 MED ORDER — OXYCODONE HCL 5 MG PO TABS: 5.0000 mg | ORAL_TABLET | Freq: Once | ORAL | Status: DC | PRN

## 2021-10-09 MED ORDER — GLYCOPYRROLATE 0.2 MG/ML IJ SOLN
INTRAMUSCULAR | Status: AC
  Filled 2021-10-09: qty 1

## 2021-10-09 SURGICAL SUPPLY — 85 items
AID PSTN UNV HD RSTRNT DISP (MISCELLANEOUS) ×1
BAG COUNTER SPONGE SURGICOUNT (BAG) ×1 IMPLANT
BAG SPEC THK2 15X12 ZIP CLS (MISCELLANEOUS) ×1
BAG SPNG CNTER NS LX DISP (BAG) ×1
BAG ZIPLOCK 12X15 (MISCELLANEOUS) ×2 IMPLANT
BASEPLATE P2 COATD GLND 6.5X30 (Shoulder) IMPLANT
BIT DRILL 1.6MX128 (BIT) ×2 IMPLANT
BIT DRILL 2.5 DIA 127 CALI (BIT) ×1 IMPLANT
BIT DRILL 4 DIA CALIBRATED (BIT) ×1 IMPLANT
BLADE SAW SAG 73X25 THK (BLADE) ×1
BLADE SAW SGTL 73X25 THK (BLADE) ×1 IMPLANT
BSPLAT GLND 30 STRL LF SHLDR (Shoulder) ×1 IMPLANT
CEMENT BONE DEPUY (Cement) ×1 IMPLANT
COOLER ICEMAN CLASSIC (MISCELLANEOUS) ×1 IMPLANT
COVER BACK TABLE 60X90IN (DRAPES) ×2 IMPLANT
COVER SURGICAL LIGHT HANDLE (MISCELLANEOUS) ×2 IMPLANT
DRAPE INCISE IOBAN 66X45 STRL (DRAPES) ×2 IMPLANT
DRAPE ORTHO SPLIT 77X108 STRL (DRAPES) ×4
DRAPE POUCH INSTRU U-SHP 10X18 (DRAPES) ×2 IMPLANT
DRAPE SURG 17X11 SM STRL (DRAPES) ×2 IMPLANT
DRAPE SURG ORHT 6 SPLT 77X108 (DRAPES) ×2 IMPLANT
DRAPE TOP 10253 STERILE (DRAPES) ×2 IMPLANT
DRAPE U-SHAPE 47X51 STRL (DRAPES) ×2 IMPLANT
DRSG AQUACEL AG ADV 3.5X 6 (GAUZE/BANDAGES/DRESSINGS) ×2 IMPLANT
DURAPREP 26ML APPLICATOR (WOUND CARE) ×4 IMPLANT
ELECT BLADE TIP CTD 4 INCH (ELECTRODE) ×2 IMPLANT
ELECT REM PT RETURN 15FT ADLT (MISCELLANEOUS) ×2 IMPLANT
GLOVE BIO SURGEON STRL SZ7 (GLOVE) ×2 IMPLANT
GLOVE BIO SURGEON STRL SZ7.5 (GLOVE) ×2 IMPLANT
GLOVE BIOGEL PI IND STRL 6.5 (GLOVE) ×1 IMPLANT
GLOVE BIOGEL PI IND STRL 7.0 (GLOVE) ×1 IMPLANT
GLOVE BIOGEL PI IND STRL 8 (GLOVE) ×1 IMPLANT
GLOVE BIOGEL PI INDICATOR 6.5 (GLOVE) ×1
GLOVE BIOGEL PI INDICATOR 7.0 (GLOVE) ×1
GLOVE BIOGEL PI INDICATOR 8 (GLOVE) ×1
GLOVE SURG POLYISO LF SZ6.5 (GLOVE) ×2 IMPLANT
GOWN STRL REUS W/ TWL XL LVL3 (GOWN DISPOSABLE) ×1 IMPLANT
GOWN STRL REUS W/TWL XL LVL3 (GOWN DISPOSABLE) ×2
GUIDEWIRE GLENOID 2.5X220 (WIRE) ×1 IMPLANT
HANDPIECE INTERPULSE COAX TIP (DISPOSABLE) ×2
HEMOSTAT SURGICEL 2X14 (HEMOSTASIS) ×2 IMPLANT
HOOD PEEL AWAY FLYTE STAYCOOL (MISCELLANEOUS) ×6 IMPLANT
INSERT SMALL SOCKET 32MM NEU (Insert) ×1 IMPLANT
KIT BASIN OR (CUSTOM PROCEDURE TRAY) ×2 IMPLANT
KIT TURNOVER KIT A (KITS) IMPLANT
MANIFOLD NEPTUNE II (INSTRUMENTS) ×2 IMPLANT
NDL TROCAR POINT SZ 2 1/2 (NEEDLE) ×1 IMPLANT
NEEDLE TROCAR POINT SZ 2 1/2 (NEEDLE) ×2 IMPLANT
NS IRRIG 1000ML POUR BTL (IV SOLUTION) ×2 IMPLANT
P2 COATDE GLNOID BSEPLT 6.5X30 (Shoulder) ×2 IMPLANT
PACK SHOULDER (CUSTOM PROCEDURE TRAY) ×2 IMPLANT
PAD COLD SHLDR WRAP-ON (PAD) ×1 IMPLANT
PROTECTOR NERVE ULNAR (MISCELLANEOUS) IMPLANT
RESTRAINT HEAD UNIVERSAL NS (MISCELLANEOUS) ×2 IMPLANT
RETRIEVER SUT HEWSON (MISCELLANEOUS) ×2 IMPLANT
SCREW BONE LOCKING RSP 5.0X14 (Screw) ×2 IMPLANT
SCREW BONE LOCKING RSP 5.0X30 (Screw) ×2 IMPLANT
SCREW BONE RSP LOCK 5X14 (Screw) IMPLANT
SCREW BONE RSP LOCK 5X18 (Screw) IMPLANT
SCREW BONE RSP LOCK 5X22 (Screw) IMPLANT
SCREW BONE RSP LOCK 5X30 (Screw) IMPLANT
SCREW BONE RSP LOCKING 18MM LG (Screw) ×2 IMPLANT
SCREW BONE RSP LOCKING 5.0X32 (Screw) ×2 IMPLANT
SCREW RETAIN W/HEAD 4MM OFFSET (Shoulder) ×1 IMPLANT
SET HNDPC FAN SPRY TIP SCT (DISPOSABLE) ×1 IMPLANT
SLING ARM IMMOBILIZER LRG (SOFTGOODS) IMPLANT
SLING ARM IMMOBILIZER MED (SOFTGOODS) ×1 IMPLANT
SMARTMIX MINI TOWER (MISCELLANEOUS) ×2
SPONGE T-LAP 18X18 ~~LOC~~+RFID (SPONGE) ×1 IMPLANT
SPONGE T-LAP 4X18 ~~LOC~~+RFID (SPONGE) ×2 IMPLANT
STEM HUMERAL 8X48 SHOULDER (Miscellaneous) ×1 IMPLANT
STRIP CLOSURE SKIN 1/2X4 (GAUZE/BANDAGES/DRESSINGS) ×3 IMPLANT
SUCTION FRAZIER HANDLE 12FR (TUBING) ×2
SUCTION TUBE FRAZIER 12FR DISP (TUBING) ×1 IMPLANT
SUPPORT WRAP ARM LG (MISCELLANEOUS) ×2 IMPLANT
SUT ETHIBOND 2 V 37 (SUTURE) ×2 IMPLANT
SUT MNCRL AB 4-0 PS2 18 (SUTURE) ×2 IMPLANT
SUT VIC AB 2-0 CT1 27 (SUTURE) ×4
SUT VIC AB 2-0 CT1 TAPERPNT 27 (SUTURE) ×2 IMPLANT
TAPE LABRALWHITE 1.5X36 (TAPE) ×2 IMPLANT
TAPE SUT LABRALTAP WHT/BLK (SUTURE) ×2 IMPLANT
TOWEL OR 17X26 10 PK STRL BLUE (TOWEL DISPOSABLE) ×2 IMPLANT
TOWER SMARTMIX MINI (MISCELLANEOUS) ×1 IMPLANT
TUBE SUCTION HIGH CAP CLEAR NV (SUCTIONS) ×1 IMPLANT
WATER STERILE IRR 1000ML POUR (IV SOLUTION) ×3 IMPLANT

## 2021-10-09 NOTE — Anesthesia Procedure Notes (Signed)
Procedure Name: Intubation ?Date/Time: 10/09/2021 11:22 AM ?Performed by: Erlinda Solinger D, CRNA ?Pre-anesthesia Checklist: Patient identified, Emergency Drugs available, Suction available and Patient being monitored ?Patient Re-evaluated:Patient Re-evaluated prior to induction ?Oxygen Delivery Method: Circle system utilized ?Preoxygenation: Pre-oxygenation with 100% oxygen ?Induction Type: IV induction ?Ventilation: Mask ventilation without difficulty ?Grade View: Grade I ?Tube type: Oral ?Tube size: 7.0 mm ?Number of attempts: 1 ?Airway Equipment and Method: Stylet ?Placement Confirmation: ETT inserted through vocal cords under direct vision, positive ETCO2 and breath sounds checked- equal and bilateral ?Secured at: 21 cm ?Tube secured with: Tape ?Dental Injury: Teeth and Oropharynx as per pre-operative assessment  ? ? ? ? ?

## 2021-10-09 NOTE — H&P (Signed)
Alisha Matthews is an 71 y.o. female.   ?Chief Complaint: Left shoulder pain and dysfunction ?HPI: Endstage L shoulder arthritis with significant pain and dysfunction, failed conservative measures.  Pain interferes with sleep and quality of life.  ? ?Past Medical History:  ?Diagnosis Date  ? Anemia, unspecified   ? Anxiety   ? Arthritis   ? Asthma   ? mild exercise induced asthma as a child   ? Barrett's esophagus   ? Breast cancer ([place])   ? right breast invasive ductal carcinoma   ? Breast fibrocystic disorder   ? Bronchitis   ? Chicken pox   ? Constipation, chronic   ? Depressive disorder, not elsewhere classified   ? Diabetes mellitus   ? borderline  ? Exercise induced bronchospasm   ? Genital warts   ? GERD (gastroesophageal reflux disease)   ? Hyperlipidemia   ? Infected postoperative breast seroma   ? Insomnia, unspecified   ? Migraine, unspecified, without mention of intractable migraine without mention of status migrainosus   ? Obesity, unspecified   ? Osteoarthrosis, unspecified whether generalized or localized, ankle and foot   ? ankle/foot  ? Personal history of colonic polyps   ? Symptomatic menopausal or female climacteric states   ? ? ?Past Surgical History:  ?Procedure Laterality Date  ? BREAST LUMPECTOMY WITH NEEDLE LOCALIZATION AND AXILLARY SENTINEL LYMPH NODE BX  06/08/2012  ? Procedure: BREAST LUMPECTOMY WITH NEEDLE LOCALIZATION AND AXILLARY SENTINEL LYMPH NODE BX;  Surgeon: Rolm Bookbinder, MD;  Location: [place];  Service: General;  Laterality: Right;  ? BREAST SURGERY    ? BUNIONECTOMY    ? 3 surgeries on both feet  ? CATARACT EXTRACTION, BILATERAL    ? 2022. 2023  ? CHOLECYSTECTOMY    ? COLONOSCOPY    ? dental implant    ? EYE SURGERY    ? Orbital right eye surgery  ? FOOT SURGERY    ? Left-revision  ? JOINT REPLACEMENT    ? NASAL SEPTUM SURGERY    ? RE-EXCISION OF BREAST CANCER,SUPERIOR MARGINS  06/27/2012  ? Procedure: RE-EXCISION OF BREAST CANCER,SUPERIOR MARGINS;  Surgeon:  Rolm Bookbinder, MD;  Location: [place];  Service: General;  Laterality: Right;  ? TOTAL HIP ARTHROPLASTY    ? left      Perthes disease  ? TOTAL KNEE ARTHROPLASTY Left 02/20/2014  ? Procedure: TOTAL KNEE ARTHROPLASTY;  Surgeon: Hessie Dibble, MD;  Location: [place];  Service: Orthopedics;  Laterality: Left;  ? TOTAL SHOULDER ARTHROPLASTY Right 06/27/2020  ? Procedure: TOTAL SHOULDER ARTHROPLASTY;  Surgeon: Tania Ade, MD;  Location: [facility];  Service: Orthopedics;  Laterality: Right;  ? ? ?Family History  ?Adopted: Yes  ?Problem Relation Age of Onset  ? Hypertension Mother   ? Diabetes Mother   ? Heart failure Mother   ? Heart disease Mother   ? Hyperlipidemia Mother   ? Stroke Mother   ? Mental illness Mother   ? Cancer Maternal Grandmother 62  ?     non hodgkins lymphoma  ? Cancer Maternal Grandfather   ? Deep vein thrombosis Brother   ? ?Social History:  reports that she has never smoked. She has never used smokeless tobacco. She reports that she does not drink alcohol and does not use drugs. ? ?Allergies:  ?Allergies  ?Allergen Reactions  ? Penicillins Rash  ? ? ?Medications Prior to Admission  ?Medication Sig Dispense Refill  ? ALPRAZolam (XANAX) 0.25 MG tablet TAKE  1 TABLET BY MOUTH AT BEDTIME AS NEEDED FOR ANXIETY (Patient taking differently: Take 0.25 mg by mouth at bedtime.) 30 tablet 5  ? atorvastatin (LIPITOR) 20 MG tablet TAKE 1 TABLET BY MOUTH DAILY 90 tablet 3  ? Calcium-Magnesium-Vitamin D (CALCIUM 500 PO) Take 500 mg by mouth 3 (three) times daily.    ? Coenzyme Q10 (COQ10) 200 MG CAPS Take 200 mg by mouth daily.    ? diclofenac (VOLTAREN) 75 MG EC tablet Take 1 tablet (75 mg total) by mouth 2 (two) times daily. 180 tablet 3  ? esomeprazole (NEXIUM) 40 MG capsule TAKE 1 CAPSULE BY MOUTH DAILY 90 capsule 3  ? LevOCARNitine (CARNITINE PO) Take 3,000 mg by mouth daily.     ? metFORMIN (GLUCOPHAGE-XR) 500 MG 24 hr tablet TAKE 1 TABLET BY MOUTH 2 TIMES DAILY 180 tablet 3  ?  misoprostol (CYTOTEC) 200 MCG tablet TAKE 1 TABLET BY MOUTH 2 TIMES DAILY. 180 tablet 2  ? Multiple Vitamins-Minerals (ANTIOXIDANT PO) Take 1 capsule by mouth daily. Fruit    ? OVER THE COUNTER MEDICATION Take 1 capsule by mouth daily. Procast package: Elite-100 omega 3 vitamin    ? OVER THE COUNTER MEDICATION Take 1 capsule by mouth daily. Procast: Memory,brain and liver vitamin    ? OVER THE COUNTER MEDICATION Take 1 capsule by mouth daily. Circulation and vein support vitamin    ? PARoxetine (PAXIL) 20 MG tablet TAKE ONE TABLET BY MOUTH DAILY 90 tablet 0  ? Probiotic Product (PROBIOTIC DAILY PO) Take 1 capsule by mouth daily.    ? traZODone (DESYREL) 50 MG tablet TAKE 1/2 TO 1 TABLET BY MOUTH AT [facility] NEEDED FOR SLEEP (Patient taking differently: Take 50 mg by mouth at bedtime.) 90 tablet 2  ? Blood Glucose Monitoring Suppl (ACCU-CHEK GUIDE ME) w/Device KIT USE DAILY TO CHECK BLOOD SUGAR. DX E11.9 1 kit 0  ? clindamycin (CLEOCIN) 150 MG capsule Take 600 mg by mouth See admin instructions. Take 600 mg by mouth prior to dental procedures    ? frovatriptan (FROVA) 2.5 MG tablet Take 1 tablet (2.5 mg total) by mouth as needed for migraine (max 3 tabs in 24 hours). If recurs, may repeat after 2 hours. Max of 3 tabs in 24 hours. 10 tablet 1  ? glipiZIDE (GLUCOTROL) 5 MG tablet Take 1 tab with every dose of prednisone. 30 tablet 0  ? ? ?Results for orders placed or performed during the hospital encounter of 10/09/21 (from the past 48 hour(s))  ?Glucose, capillary     Status: None  ? Collection Time: 10/09/21  9:33 AM  ?Result Value Ref Range  ? Glucose-Capillary 81 70 - 99 mg/dL  ?  Comment: Glucose reference range applies only to samples taken after fasting for at least 8 hours.  ? ?No results found. ? ?Review of Systems  ?All other systems reviewed and are negative. ? ?Blood pressure 120/66, pulse (!) 59, temperature 98.3 ?F (36.8 ?C), temperature source Oral, resp. rate 15, height 5' 3"  (1.6 m), weight 64.9 kg,  last menstrual period 06/22/2000, SpO2 100 %. ?Physical Exam ?HENT:  ?   Head: Atraumatic.  ?Eyes:  ?   Extraocular Movements: Extraocular movements intact.  ?Cardiovascular:  ?   Pulses: Normal pulses.  ?Pulmonary:  ?   Effort: Pulmonary effort is normal.  ?Musculoskeletal:  ?   Comments: Left shoulder pain with limited range of motion.  Distally neurovascularly intact  ?Neurological:  ?   Mental Status: She is alert.  ?  Psychiatric:     ?   Mood and Affect: Mood normal.  ?  ? ?Assessment/Plan ?Left shoulder end-stage osteoarthritis, failed conservative management ?Plan left total shoulder replacement ?Risks / benefits of surgery discussed ?Consent on chart  ?NPO for OR ?Preop antibiotics ? ? ?Rhae Hammock, MD ?10/09/2021, 10:26 AM ? ? ? ?

## 2021-10-09 NOTE — Discharge Instructions (Addendum)

## 2021-10-09 NOTE — Evaluation (Signed)
Occupational Therapy Evaluation ?Patient Details ?Name: Alisha Matthews ?MRN: [id number] ?DOB: July 27, 1950 ?Today's Date: 10/09/2021 ? ? ?History of Present Illness Patient is a 71 year old woman s/p left reverse TSA  ? ?Clinical Impression ?  ?Alisha Matthews is a 71 year old woman s/p shoulder replacement without functional use of left non-dominant upper extremity secondary to effects of surgery and interscalene block and shoulder precautions. Therapist provided education and instruction to patient and spouse in regards to exercises, precautions, positioning, donning upper extremity clothing and bathing while maintaining shoulder precautions, ice and edema management and donning/doffing sling. Patient and spouse verbalized understanding and demonstrated as needed. Patient needed assistance to donn shirt, underwear, pants, socks and shoes and provided with instruction on compensatory strategies to perform ADLs. Patient to follow up with MD for further therapy needs.  ?  ?   ? ?Recommendations for follow up therapy are one component of a multi-disciplinary discharge planning process, led by the attending physician.  Recommendations may be updated based on patient status, additional functional criteria and insurance authorization.  ? ?Follow Up Recommendations ? Follow physician's recommendations for discharge plan and follow up therapies  ?  ?Assistance Recommended at Discharge PRN  ?Patient can return home with the following Assistance with cooking/housework;A little help with bathing/dressing/bathroom ? ?  ?Functional Status Assessment ? Patient has had a recent decline in their functional status and demonstrates the ability to make significant improvements in function in a reasonable and predictable amount of time.  ?Equipment Recommendations ? None recommended by OT  ?  ?Recommendations for Other Services   ? ? ?  ?Precautions / Restrictions Precautions ?Precautions: Shoulder ?Type of Shoulder Precautions: No AROM, No  PROM ?Shoulder Interventions: Shoulder sling/immobilizer;At all times;Off for dressing/bathing/exercises ?Precaution Booklet Issued:  (handouts) ?Required Braces or Orthoses: Sling ?Restrictions ?Weight Bearing Restrictions: Yes  ? ?  ? ?Mobility Bed Mobility ?Overal bed mobility: Independent ?  ?  ?  ?  ?  ?  ?  ?  ? ?Transfers ?Overall transfer level: Independent ?  ?  ?  ?  ?  ?  ?  ?  ?  ?  ? ?  ?Balance Overall balance assessment: No apparent balance deficits (not formally assessed) ?  ?  ?  ?  ?  ?  ?  ?  ?  ?  ?  ?  ?  ?  ?  ?  ?  ?  ?   ? ?ADL either performed or assessed with clinical judgement  ? ?ADL   ?  ?  ?  ?  ?  ?  ?  ?  ?  ?  ?  ?  ?  ?  ?  ?  ?  ?  ?  ?   ? ? ? ?Vision Patient Visual Report: No change from baseline ?   ?   ?Perception   ?  ?Praxis   ?  ? ?Pertinent Vitals/Pain Pain Assessment ?Pain Assessment: No/denies pain  ? ? ? ?Hand Dominance   ?  ?Extremity/Trunk Assessment Upper Extremity Assessment ?Upper Extremity Assessment: LUE deficits/detail ?LUE Deficits / Details: impaired AROM and sensation  secondary to block ?  ?Lower Extremity Assessment ?Lower Extremity Assessment: Overall WFL for tasks assessed ?  ?Cervical / Trunk Assessment ?Cervical / Trunk Assessment: Normal ?  ?Communication   ?  ?Cognition Arousal/Alertness: Awake/alert ?Behavior During Therapy: [facility] for tasks assessed/performed ?Overall Cognitive Status: Within Functional Limits for tasks assessed ?  ?  ?  ?  ?  ?  ?  ?  ?  ?  ?  ?  ?  ?  ?  ?  ?  ?  ?  ?  General Comments    ? ?  ?Exercises   ?  ?Shoulder Instructions Shoulder Instructions ?Donning/doffing shirt without moving shoulder: Caregiver independent with task ?Method for sponge bathing under operated UE: Independent ?Donning/doffing sling/immobilizer: Caregiver independent with task ?Correct positioning of sling/immobilizer: Independent ?ROM for elbow, wrist and digits of operated UE: Independent ?Sling wearing schedule (on at all times/off for ADL's):  Independent ?Proper positioning of operated UE when showering: Independent ?Dressing change: Independent ?Positioning of UE while sleeping: Independent  ? ? ?Home Living Family/patient expects to be discharged to:: Private residence ?Living Arrangements: Spouse/significant other ?Available Help at Discharge: Available 24 hours/day ?Type of Home: House ?  ?  ?  ?  ?  ?  ?  ?  ?  ?  ?  ?  ?  ?  ?  ? ?  ?Prior Functioning/Environment   ?  ?  ?  ?  ?  ?  ?  ?  ?  ? ?  ?  ?OT Problem List: Decreased strength;Decreased range of motion;Impaired UE functional use ?  ?   ?OT Treatment/Interventions:    ?  ?OT Goals(Current goals can be found in the care plan section) Acute Rehab OT Goals ?OT Goal Formulation: All assessment and education complete, DC therapy  ?OT Frequency:   ?  ? ?Co-evaluation   ?  ?  ?  ?  ? ?  ?AM-PAC OT "6 Clicks" Daily Activity     ?Outcome Measure Help from another person eating meals?: A Little ?Help from another person taking care of personal grooming?: None ?Help from another person toileting, which includes using toliet, bedpan, or urinal?: None ?Help from another person bathing (including washing, rinsing, drying)?: A Little ?Help from another person to put on and taking off regular upper body clothing?: A Lot ?Help from another person to put on and taking off regular lower body clothing?: A Little ?6 Click Score: 19 ?  ?End of Session Nurse Communication:  (Ot education complete) ? ?Activity Tolerance: Patient tolerated treatment well ?Patient left: in chair;with family/visitor present ? ?OT Visit Diagnosis: Muscle weakness (generalized) (M62.81)  ?              ?Time: 8270-7867 ?OT Time Calculation (min): 30 min ?Charges:  OT General Charges ?$OT Visit: 1 Visit ?OT Evaluation ?$OT Eval Low Complexity: 1 Low ?OT Treatments ?$Self Care/Home Management : 8-22 mins ? ?Amory Simonetti, OTR/L ?Acute Care Rehab Services  ?Office [phone] ?Pager: [phone]  ? ?Jayston Trevino L Josh Nicolosi ?10/09/2021, 3:53 PM ?

## 2021-10-09 NOTE — Anesthesia Procedure Notes (Signed)
Anesthesia Regional Block: Interscalene brachial plexus block  ? ?Pre-Anesthetic Checklist: , timeout performed,  Correct Patient, Correct Site, Correct Laterality,  Correct Procedure, Correct Position, site marked,  Risks and benefits discussed,  Pre-op evaluation,  At surgeon's request and post-op pain management ? ?Laterality: Left ? ?Prep: Maximum Sterile Barrier Precautions used, chloraprep     ?  ?Needles:  ?Injection technique: Single-shot ? ?Needle Type: Echogenic Stimulator Needle   ? ? ?Needle Length: 9cm  ?Needle Gauge: 22  ? ? ? ?Additional Needles: ? ? ?Procedures:,,,, ultrasound used (permanent image in chart),,    ?Narrative:  ?Start time: 10/09/2021 10:44 AM ?End time: 10/09/2021 10:47 AM ?Injection made incrementally with aspirations every 5 mL. ? ?Performed by: Personally  ?Anesthesiologist: Brennan Bailey, MD ? ?Additional Notes: ?Risks, benefits, and alternative discussed. Patient gave consent for procedure. Patient prepped and draped in sterile fashion. Sedation administered, patient remains easily responsive to voice. Relevant anatomy identified with ultrasound guidance. Local anesthetic given in 5cc increments with no signs or symptoms of intravascular injection. No pain or paraesthesias with injection. Patient monitored throughout procedure with signs of LAST or immediate complications. Tolerated well. Ultrasound image placed in chart.  ?Tawny Asal, MD ? ? ? ? ? ? ?

## 2021-10-09 NOTE — Anesthesia Preprocedure Evaluation (Addendum)
Anesthesia Evaluation  ?Patient identified by MRN, date of birth, ID band ?Patient awake ? ? ? ?Reviewed: ?Allergy & Precautions, NPO status , Patient's Chart, lab work & pertinent test results ? ?History of Anesthesia Complications ?Negative for: history of anesthetic complications ? ?Airway ?Mallampati: II ? ?TM Distance: >3 FB ?Neck ROM: Full ? ? ? Dental ? ?(+) Chipped,  ?  ?Pulmonary ?asthma ,  ?  ?Pulmonary exam normal ? ? ? ? ? ? ? Cardiovascular ?negative cardio ROS ?Normal cardiovascular exam ? ? ?  ?Neuro/Psych ? Headaches, Anxiety Depression   ? GI/Hepatic ?Neg liver ROS, GERD  Medicated and Controlled,  ?Endo/Other  ?diabetes, Type 2, Oral Hypoglycemic Agents ? Renal/GU ?negative Renal ROS  ?negative genitourinary ?  ?Musculoskeletal ? ?(+) Arthritis ,  ? Abdominal ?  ?Peds ? Hematology ? ?(+) Blood dyscrasia, anemia , Hgb 10.1   ?Anesthesia Other Findings ?Day of surgery medications reviewed with patient. ? Reproductive/Obstetrics ?negative OB ROS ? ?  ? ? ? ? ? ? ? ? ? ? ? ? ? ?  ?  ? ? ? ? ? ? ? ?Anesthesia Physical ?Anesthesia Plan ? ?ASA: 2 ? ?Anesthesia Plan: General  ? ?Post-op Pain Management: Tylenol PO (pre-op)* and Regional block*  ? ?Induction: Intravenous ? ?PONV Risk Score and Plan: 3 and Dexamethasone, Ondansetron and Treatment may vary due to age or medical condition ? ?Airway Management Planned: Oral ETT ? ?Additional Equipment: None ? ?Intra-op Plan:  ? ?Post-operative Plan: Extubation in OR ? ?Informed Consent: I have reviewed the patients History and Physical, chart, labs and discussed the procedure including the risks, benefits and alternatives for the proposed anesthesia with the patient or authorized representative who has indicated his/her understanding and acceptance.  ? ? ? ?Dental advisory given ? ?Plan Discussed with: CRNA ? ?Anesthesia Plan Comments:   ? ? ? ? ? ?Anesthesia Quick Evaluation ? ?

## 2021-10-09 NOTE — Transfer of Care (Signed)
Immediate Anesthesia Transfer of Care Note ? ?Patient: Alisha Matthews ? ?Procedure(s) Performed: REVERSE SHOULDER ARTHROPLASTY (Left: Shoulder) ? ?Patient Location: PACU ? ?Anesthesia Type:General and Regional ? ?Level of Consciousness: awake, alert  and oriented ? ?Airway & Oxygen Therapy: Patient Spontanous Breathing and Patient connected to face mask oxygen ? ?Post-op Assessment: Report given to RN and Post -op Vital signs reviewed and stable ? ?Post vital signs: Reviewed and stable ? ?Last Vitals:  ?Vitals Value Taken Time  ?BP 124/62 10/09/21 1253  ?Temp 36.5 ?C 10/09/21 1253  ?Pulse 96 10/09/21 1256  ?Resp 16 10/09/21 1256  ?SpO2 94 % 10/09/21 1256  ?Vitals shown include unvalidated device data. ? ?Last Pain:  ?Vitals:  ? 10/09/21 1253  ?TempSrc:   ?PainSc: (P) 0-No pain  ?   ? ?  ? ?Complications: No notable events documented. ?

## 2021-10-09 NOTE — Progress Notes (Signed)
Assisted Dr. Birdie Sons with left, interscalene , ultrasound guided block. Side rails up, monitors on throughout procedure. See vital signs in flow sheet. Tolerated Procedure well. ? ?

## 2021-10-09 NOTE — Anesthesia Postprocedure Evaluation (Signed)
Anesthesia Post Note ? ?Patient: Alisha Matthews ? ?Procedure(s) Performed: REVERSE SHOULDER ARTHROPLASTY (Left: Shoulder) ? ?  ? ?Patient location during evaluation: PACU ?Anesthesia Type: General ?Level of consciousness: awake and alert ?Pain management: pain level controlled ?Vital Signs Assessment: post-procedure vital signs reviewed and stable ?Respiratory status: spontaneous breathing, nonlabored ventilation and respiratory function stable ?Cardiovascular status: blood pressure returned to baseline ?Postop Assessment: no apparent nausea or vomiting ?Anesthetic complications: no ? ? ?No notable events documented. ? ?Last Vitals:  ?Vitals:  ? 10/09/21 1315 10/09/21 1330  ?BP: 108/76 128/69  ?Pulse: 88 84  ?Resp: 20 18  ?Temp:  (!) 36.4 ?C  ?SpO2: 97% 98%  ?  ?Last Pain:  ?Vitals:  ? 10/09/21 1330  ?TempSrc:   ?PainSc: 0-No pain  ? ? ?  ?  ?  ?  ?  ?  ? ?[name] ? ? ? ? ?

## 2021-10-09 NOTE — Op Note (Signed)
Procedure(s): ?REVERSE SHOULDER ARTHROPLASTY Procedure Note ? ?Carlea Badour Naim ?female ?71 y.o. ?10/09/2021 ? ?Preoperative diagnosis: Left shoulder end-stage osteoarthritis ?Postoperative diagnosis: Left shoulder end-stage osteoarthritis with rotator cuff tear ? ? ?Procedure(s) and Anesthesia Type: ?   * REVERSE SHOULDER ARTHROPLASTY - Choice ? ? ?Indications:  71 y.o. female  With endstage left shoulder arthritis. Pain and dysfunction interfered with quality of life and nonoperative treatment with activity modification, NSAIDS and injections failed. ?    ?Surgeon: Rhae Hammock  ? ?Assistants: Forensic psychologist was present and scrubbed throughout the procedure and was essential in positioning, retraction, exposure, and closure) ? ?Anesthesia: General endotracheal anesthesia with preoperative interscalene block given by the attending anesthesiologist ? ? ? ?Procedure Detail ? ?REVERSE SHOULDER ARTHROPLASTY ? ? ?Estimated Blood Loss:  200 mL ?        ?Drains: none ? ?Blood Given: none  ?        ?Specimens: none ?       ?Complications:  * No complications entered in OR log * ?        ?Disposition: PACU - hemodynamically stable. ?        ?Condition: stable ?   ? ? ?OPERATIVE FINDINGS: She was noted to have a full-thickness rotator cuff tear intraoperatively. ?A DJO Altivate pressfit reverse total shoulder arthroplasty was placed with a  ?size 8 stem, a 32-4 glenosphere, and a standard-mm poly insert. The base plate  ?fixation was excellent. ? ?PROCEDURE: The patient was identified in the preoperative holding area  ?where I personally marked the operative site after verifying site, side,  ?and procedure with the patient. An interscalene block given by  ?the attending anesthesiologist in the holding area and the patient was taken back to the operating room where all extremities were  ?carefully padded in position after general anesthesia was induced. She  ?was placed in a beach-chair position and the operative  upper extremity was  ?prepped and draped in a standard sterile fashion. An approximately 10-  ?cm incision was made from the tip of the coracoid process to the center  ?point of the humerus at the level of the axilla. Dissection was carried  ?down through subcutaneous tissues to the level of the cephalic vein  ?which was taken laterally with the deltoid. The pectoralis major was  ?retracted medially. The subdeltoid space was developed and the lateral  ?edge of the conjoined tendon was identified. The undersurface of  ?conjoined tendon was palpated and the musculocutaneous nerve was not in  ?the field. Retractor was placed underneath the conjoined and second  ?retractor was placed lateral into the deltoid. The circumflex humeral  ?artery and vessels were identified and clamped and coagulated. The  ?biceps tendon was tenotomized.  The subscapularis was taken down as a peel with the underlying capsule.  The  ?joint was then gently externally rotated while the capsule was released  ?from the humeral neck around to just beyond the 6 o'clock position. At  ?this point, the joint was dislocated and the humeral head was presented  ?into the wound.  The rotator cuff was noted to be severely degenerated and there was a small full-thickness tear in the supraspinatus.  The decision was made to proceed with reverse total shoulder arthroplasty.   ?The excessive osteophyte formation was removed with a  ?large rongeur.  The cutting guide was used to make the appropriate  ?head cut and the head was saved for potentially bone grafting.  The glenoid was  exposed with the arm in an  ?abducted extended position. The anterior and posterior labrum were  ?completely excised and the capsule was released circumferentially to  ?allow for exposure of the glenoid for preparation. The 2.5 mm drill was  ?placed using the guide in 5-10? inferior angulation and the tap was then advanced in the same hole. Small and large reamers were then used. The  tap was then removed and the Metaglene was then screwed in with excellent purchase.  The peripheral guide was then used to drilled measured and filled peripheral locking screws. The size 32-4 glenosphere was then impacted on the [facility] taper and the central screw was placed. The humerus was then again exposed and the diaphyseal reamers were used followed by the metaphyseal reamers. The final broach was left in place in the proximal trial was placed. The joint was reduced and with this implant it was felt that soft tissue tensioning was appropriate with excellent stability and excellent range of motion. Therefore, final humeral stem was placed press-fit.  And then the trial polyethylene inserts were tested again and the above implant was felt to be the most appropriate for final insertion. The joint was reduced taken through full range of motion and felt to be stable. Soft tissue tension was appropriate.  ?The joint was then copiously irrigated with pulse  ?lavage and the wound was then closed. The subscapularis was not repaired.  Skin was closed with 2-0 Vicryl in a deep dermal layer and 4-0  ?Monocryl for skin closure. Steri-Strips were applied. Sterile  ?dressings were then applied as well as a sling. The patient was allowed  ?to awaken from general anesthesia, transferred to stretcher, and taken  ?to recovery room in stable condition.  ? ?POSTOPERATIVE PLAN: The patient will be observed in the recovery room and if her pain is well controlled with the regional block and she is hemodynamically stable she could be discharged home today with family. ?

## 2021-10-10 ENCOUNTER — Encounter (HOSPITAL_COMMUNITY): Payer: Self-pay | Admitting: Orthopedic Surgery

## 2021-10-24 DIAGNOSIS — Z9889 Other specified postprocedural states: Secondary | ICD-10-CM | POA: Diagnosis not present

## 2021-10-24 DIAGNOSIS — M25512 Pain in left shoulder: Secondary | ICD-10-CM | POA: Diagnosis not present

## 2021-11-03 DIAGNOSIS — Z96612 Presence of left artificial shoulder joint: Secondary | ICD-10-CM | POA: Diagnosis not present

## 2021-11-03 DIAGNOSIS — R531 Weakness: Secondary | ICD-10-CM | POA: Diagnosis not present

## 2021-11-03 DIAGNOSIS — M25612 Stiffness of left shoulder, not elsewhere classified: Secondary | ICD-10-CM | POA: Diagnosis not present

## 2021-11-05 DIAGNOSIS — M25612 Stiffness of left shoulder, not elsewhere classified: Secondary | ICD-10-CM | POA: Diagnosis not present

## 2021-11-05 DIAGNOSIS — Z96612 Presence of left artificial shoulder joint: Secondary | ICD-10-CM | POA: Diagnosis not present

## 2021-11-05 DIAGNOSIS — R531 Weakness: Secondary | ICD-10-CM | POA: Diagnosis not present

## 2021-11-11 ENCOUNTER — Other Ambulatory Visit: Payer: Self-pay | Admitting: Family Medicine

## 2021-11-11 DIAGNOSIS — F4310 Post-traumatic stress disorder, unspecified: Secondary | ICD-10-CM

## 2021-11-11 DIAGNOSIS — R531 Weakness: Secondary | ICD-10-CM | POA: Diagnosis not present

## 2021-11-11 DIAGNOSIS — Z96612 Presence of left artificial shoulder joint: Secondary | ICD-10-CM | POA: Diagnosis not present

## 2021-11-11 DIAGNOSIS — M25612 Stiffness of left shoulder, not elsewhere classified: Secondary | ICD-10-CM | POA: Diagnosis not present

## 2021-11-11 DIAGNOSIS — K219 Gastro-esophageal reflux disease without esophagitis: Secondary | ICD-10-CM

## 2021-11-11 MED ORDER — MISOPROSTOL 200 MCG PO TABS: ORAL_TABLET | ORAL | 2 refills | Status: DC

## 2021-11-11 MED ORDER — PAROXETINE HCL 20 MG PO TABS: 20.0000 mg | ORAL_TABLET | Freq: Every day | ORAL | 0 refills | Status: DC

## 2021-11-11 NOTE — Addendum Note (Signed)
Addended by: Sharon Seller B on: 11/11/2021 10:11 AM   Modules accepted: Orders

## 2021-11-13 DIAGNOSIS — M25612 Stiffness of left shoulder, not elsewhere classified: Secondary | ICD-10-CM | POA: Diagnosis not present

## 2021-11-13 DIAGNOSIS — Z96612 Presence of left artificial shoulder joint: Secondary | ICD-10-CM | POA: Diagnosis not present

## 2021-11-13 DIAGNOSIS — R531 Weakness: Secondary | ICD-10-CM | POA: Diagnosis not present

## 2021-11-18 DIAGNOSIS — R531 Weakness: Secondary | ICD-10-CM | POA: Diagnosis not present

## 2021-11-18 DIAGNOSIS — M25612 Stiffness of left shoulder, not elsewhere classified: Secondary | ICD-10-CM | POA: Diagnosis not present

## 2021-11-18 DIAGNOSIS — Z96612 Presence of left artificial shoulder joint: Secondary | ICD-10-CM | POA: Diagnosis not present

## 2021-11-20 DIAGNOSIS — M25612 Stiffness of left shoulder, not elsewhere classified: Secondary | ICD-10-CM | POA: Diagnosis not present

## 2021-11-20 DIAGNOSIS — Z96612 Presence of left artificial shoulder joint: Secondary | ICD-10-CM | POA: Diagnosis not present

## 2021-11-20 DIAGNOSIS — R531 Weakness: Secondary | ICD-10-CM | POA: Diagnosis not present

## 2021-11-24 DIAGNOSIS — Z96612 Presence of left artificial shoulder joint: Secondary | ICD-10-CM | POA: Diagnosis not present

## 2021-11-24 DIAGNOSIS — R531 Weakness: Secondary | ICD-10-CM | POA: Diagnosis not present

## 2021-11-24 DIAGNOSIS — M25612 Stiffness of left shoulder, not elsewhere classified: Secondary | ICD-10-CM | POA: Diagnosis not present

## 2021-11-26 DIAGNOSIS — Z96612 Presence of left artificial shoulder joint: Secondary | ICD-10-CM | POA: Diagnosis not present

## 2021-11-26 DIAGNOSIS — R531 Weakness: Secondary | ICD-10-CM | POA: Diagnosis not present

## 2021-11-26 DIAGNOSIS — M25612 Stiffness of left shoulder, not elsewhere classified: Secondary | ICD-10-CM | POA: Diagnosis not present

## 2021-12-01 ENCOUNTER — Telehealth: Payer: Self-pay | Admitting: Family Medicine

## 2021-12-01 DIAGNOSIS — R531 Weakness: Secondary | ICD-10-CM | POA: Diagnosis not present

## 2021-12-01 DIAGNOSIS — F4310 Post-traumatic stress disorder, unspecified: Secondary | ICD-10-CM

## 2021-12-01 DIAGNOSIS — Z96612 Presence of left artificial shoulder joint: Secondary | ICD-10-CM | POA: Diagnosis not present

## 2021-12-01 DIAGNOSIS — M25612 Stiffness of left shoulder, not elsewhere classified: Secondary | ICD-10-CM | POA: Diagnosis not present

## 2021-12-01 MED ORDER — ALPRAZOLAM 0.25 MG PO TABS: 0.2500 mg | ORAL_TABLET | Freq: Every day | ORAL | 5 refills | Status: DC

## 2021-12-01 NOTE — Telephone Encounter (Signed)
Refill request for Alprazolam Last OV---10/01/21 Next OV--12/10/21 Last RF--06/04/21---#30 with 5 refills [facility]/UDS last updated on 12/04/20

## 2021-12-04 DIAGNOSIS — R531 Weakness: Secondary | ICD-10-CM | POA: Diagnosis not present

## 2021-12-04 DIAGNOSIS — M25612 Stiffness of left shoulder, not elsewhere classified: Secondary | ICD-10-CM | POA: Diagnosis not present

## 2021-12-04 DIAGNOSIS — Z96612 Presence of left artificial shoulder joint: Secondary | ICD-10-CM | POA: Diagnosis not present

## 2021-12-05 ENCOUNTER — Ambulatory Visit: Payer: Medicare Other

## 2021-12-08 ENCOUNTER — Encounter: Payer: Medicare Other | Admitting: Gastroenterology

## 2021-12-08 DIAGNOSIS — M25612 Stiffness of left shoulder, not elsewhere classified: Secondary | ICD-10-CM | POA: Diagnosis not present

## 2021-12-08 DIAGNOSIS — Z96612 Presence of left artificial shoulder joint: Secondary | ICD-10-CM | POA: Diagnosis not present

## 2021-12-08 DIAGNOSIS — R531 Weakness: Secondary | ICD-10-CM | POA: Diagnosis not present

## 2021-12-10 ENCOUNTER — Ambulatory Visit (INDEPENDENT_AMBULATORY_CARE_PROVIDER_SITE_OTHER): Payer: Medicare Other | Admitting: Family Medicine

## 2021-12-10 ENCOUNTER — Ambulatory Visit: Payer: Medicare Other

## 2021-12-10 ENCOUNTER — Encounter: Payer: Self-pay | Admitting: Family Medicine

## 2021-12-10 VITALS — BP 115/78 | HR 69 | Temp 98.0°F | Resp 16 | Ht 63.0 in | Wt 140.0 lb

## 2021-12-10 DIAGNOSIS — E1165 Type 2 diabetes mellitus with hyperglycemia: Secondary | ICD-10-CM

## 2021-12-10 DIAGNOSIS — F419 Anxiety disorder, unspecified: Secondary | ICD-10-CM | POA: Diagnosis not present

## 2021-12-10 DIAGNOSIS — F32A Depression, unspecified: Secondary | ICD-10-CM

## 2021-12-10 NOTE — Progress Notes (Signed)
Subjective:   Chief Complaint  Patient presents with   Follow-up    Here for follow up     Alisha Matthews is a 71 y.o. female here for follow-up of diabetes.   Alisha Matthews does not check her sugars routinely.  Patient does not require insulin.   Medications include: metformin XR 500 mg bid Diet is fair.  Exercise: gardening, stair stepper No Cp or SOB.   GAD Currently taking Paxil 20 mg/d, Xanax prn, trazodone prn. Reports compliance, no AE's. No HI or SI. No self medication. Not following with a counselor or therapist right now.   Past Medical History:  Diagnosis Date   Anemia, unspecified    Anxiety    Arthritis    Asthma    mild exercise induced asthma as a child    Barrett's esophagus    Breast cancer ([place])    right breast invasive ductal carcinoma    Breast fibrocystic disorder    Bronchitis    Chicken pox    Constipation, chronic    Depressive disorder, not elsewhere classified    Diabetes mellitus    borderline   Exercise induced bronchospasm    Genital warts    GERD (gastroesophageal reflux disease)    Hyperlipidemia    Infected postoperative breast seroma    Insomnia, unspecified    Migraine, unspecified, without mention of intractable migraine without mention of status migrainosus    Obesity, unspecified    Osteoarthrosis, unspecified whether generalized or localized, ankle and foot    ankle/foot   Personal history of colonic polyps    Symptomatic menopausal or female climacteric states      Related testing: Retinal exam: Done Pneumovax: done  Objective:  BP 115/78 (BP Location: Right Arm, Patient Position: Sitting, Cuff Size: Normal)   Pulse 69   Temp 98 F (36.7 C) (Oral)   Resp 16   Ht '5\' 3"'$  (1.6 m)   Wt 140 lb (63.5 kg)   LMP 06/22/2000   SpO2 100%   BMI 24.80 kg/m  General:  Well developed, well nourished, in no apparent distress Skin:  Warm, no pallor or diaphoresis Lungs:  CTAB, no access msc use Cardio:  RRR, no bruits, no LE  edema Musculoskeletal:  Symmetrical muscle groups noted without atrophy or deformity Neuro:  Sensation intact to pinprick on feet Psych: Age appropriate judgment and insight  Assessment:   Type 2 diabetes mellitus with hyperglycemia, without long-term current use of insulin (HCC)  Anxiety and depression   Plan:   Chronic, stable. Stop Metformin for 3 mo, will take supp, reck in 3 mo. Counseled on diet and exercise. Chronic, stable. Cont Paxil 20 mg/d, Xanax/trazodone prn.  The patient voiced understanding and agreement to the plan.  Katie, DO 12/10/21 11:11 AM

## 2021-12-10 NOTE — Patient Instructions (Signed)
Stop the metformin until we see each other next.   Keep the diet clean and stay active.  Let us know if you need anything.

## 2021-12-11 DIAGNOSIS — M25612 Stiffness of left shoulder, not elsewhere classified: Secondary | ICD-10-CM | POA: Diagnosis not present

## 2021-12-11 DIAGNOSIS — R531 Weakness: Secondary | ICD-10-CM | POA: Diagnosis not present

## 2021-12-11 DIAGNOSIS — Z96612 Presence of left artificial shoulder joint: Secondary | ICD-10-CM | POA: Diagnosis not present

## 2021-12-12 ENCOUNTER — Ambulatory Visit (INDEPENDENT_AMBULATORY_CARE_PROVIDER_SITE_OTHER): Payer: Medicare Other

## 2021-12-12 DIAGNOSIS — Z0000 Encounter for general adult medical examination without abnormal findings: Secondary | ICD-10-CM | POA: Diagnosis not present

## 2021-12-15 DIAGNOSIS — R531 Weakness: Secondary | ICD-10-CM | POA: Diagnosis not present

## 2021-12-15 DIAGNOSIS — M25612 Stiffness of left shoulder, not elsewhere classified: Secondary | ICD-10-CM | POA: Diagnosis not present

## 2021-12-15 DIAGNOSIS — Z96612 Presence of left artificial shoulder joint: Secondary | ICD-10-CM | POA: Diagnosis not present

## 2021-12-18 DIAGNOSIS — R531 Weakness: Secondary | ICD-10-CM | POA: Diagnosis not present

## 2021-12-18 DIAGNOSIS — M25612 Stiffness of left shoulder, not elsewhere classified: Secondary | ICD-10-CM | POA: Diagnosis not present

## 2021-12-18 DIAGNOSIS — Z96612 Presence of left artificial shoulder joint: Secondary | ICD-10-CM | POA: Diagnosis not present

## 2021-12-22 DIAGNOSIS — R531 Weakness: Secondary | ICD-10-CM | POA: Diagnosis not present

## 2021-12-22 DIAGNOSIS — Z96612 Presence of left artificial shoulder joint: Secondary | ICD-10-CM | POA: Diagnosis not present

## 2021-12-22 DIAGNOSIS — M25612 Stiffness of left shoulder, not elsewhere classified: Secondary | ICD-10-CM | POA: Diagnosis not present

## 2021-12-25 DIAGNOSIS — M25612 Stiffness of left shoulder, not elsewhere classified: Secondary | ICD-10-CM | POA: Diagnosis not present

## 2021-12-25 DIAGNOSIS — R531 Weakness: Secondary | ICD-10-CM | POA: Diagnosis not present

## 2021-12-25 DIAGNOSIS — Z96612 Presence of left artificial shoulder joint: Secondary | ICD-10-CM | POA: Diagnosis not present

## 2021-12-29 DIAGNOSIS — Z96612 Presence of left artificial shoulder joint: Secondary | ICD-10-CM | POA: Diagnosis not present

## 2021-12-29 DIAGNOSIS — M25612 Stiffness of left shoulder, not elsewhere classified: Secondary | ICD-10-CM | POA: Diagnosis not present

## 2021-12-29 DIAGNOSIS — R531 Weakness: Secondary | ICD-10-CM | POA: Diagnosis not present

## 2022-01-01 DIAGNOSIS — M25612 Stiffness of left shoulder, not elsewhere classified: Secondary | ICD-10-CM | POA: Diagnosis not present

## 2022-01-01 DIAGNOSIS — R531 Weakness: Secondary | ICD-10-CM | POA: Diagnosis not present

## 2022-01-01 DIAGNOSIS — Z96612 Presence of left artificial shoulder joint: Secondary | ICD-10-CM | POA: Diagnosis not present

## 2022-01-04 IMAGING — CR DG CHEST 2V
2 series · 2 of 2 positions shown · non-contrast
Comparison: 02/13/2014

CLINICAL DATA: Preop total shoulder replacement

EXAM:
CHEST - 2 VIEW

[w chest pa *]
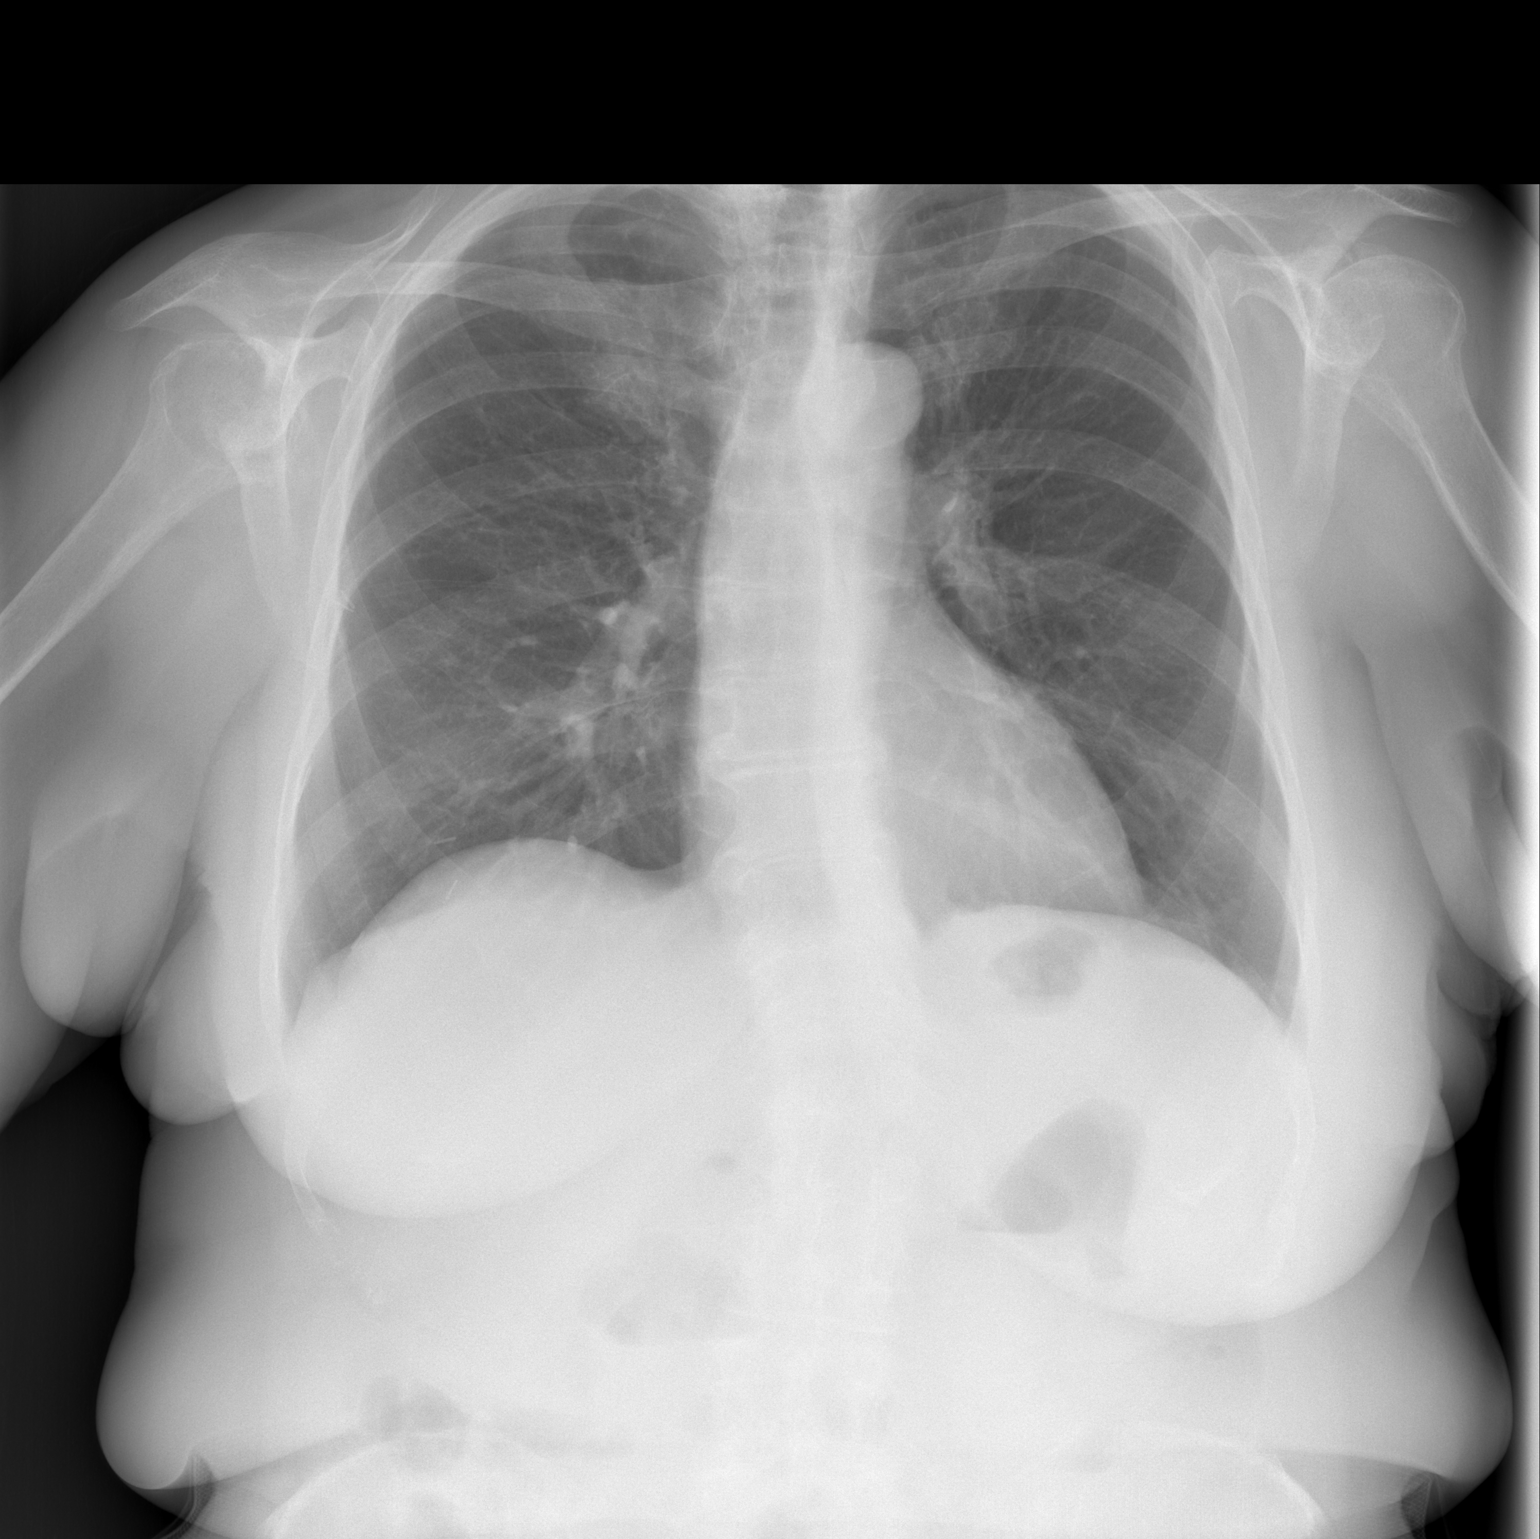

[w chest lat]
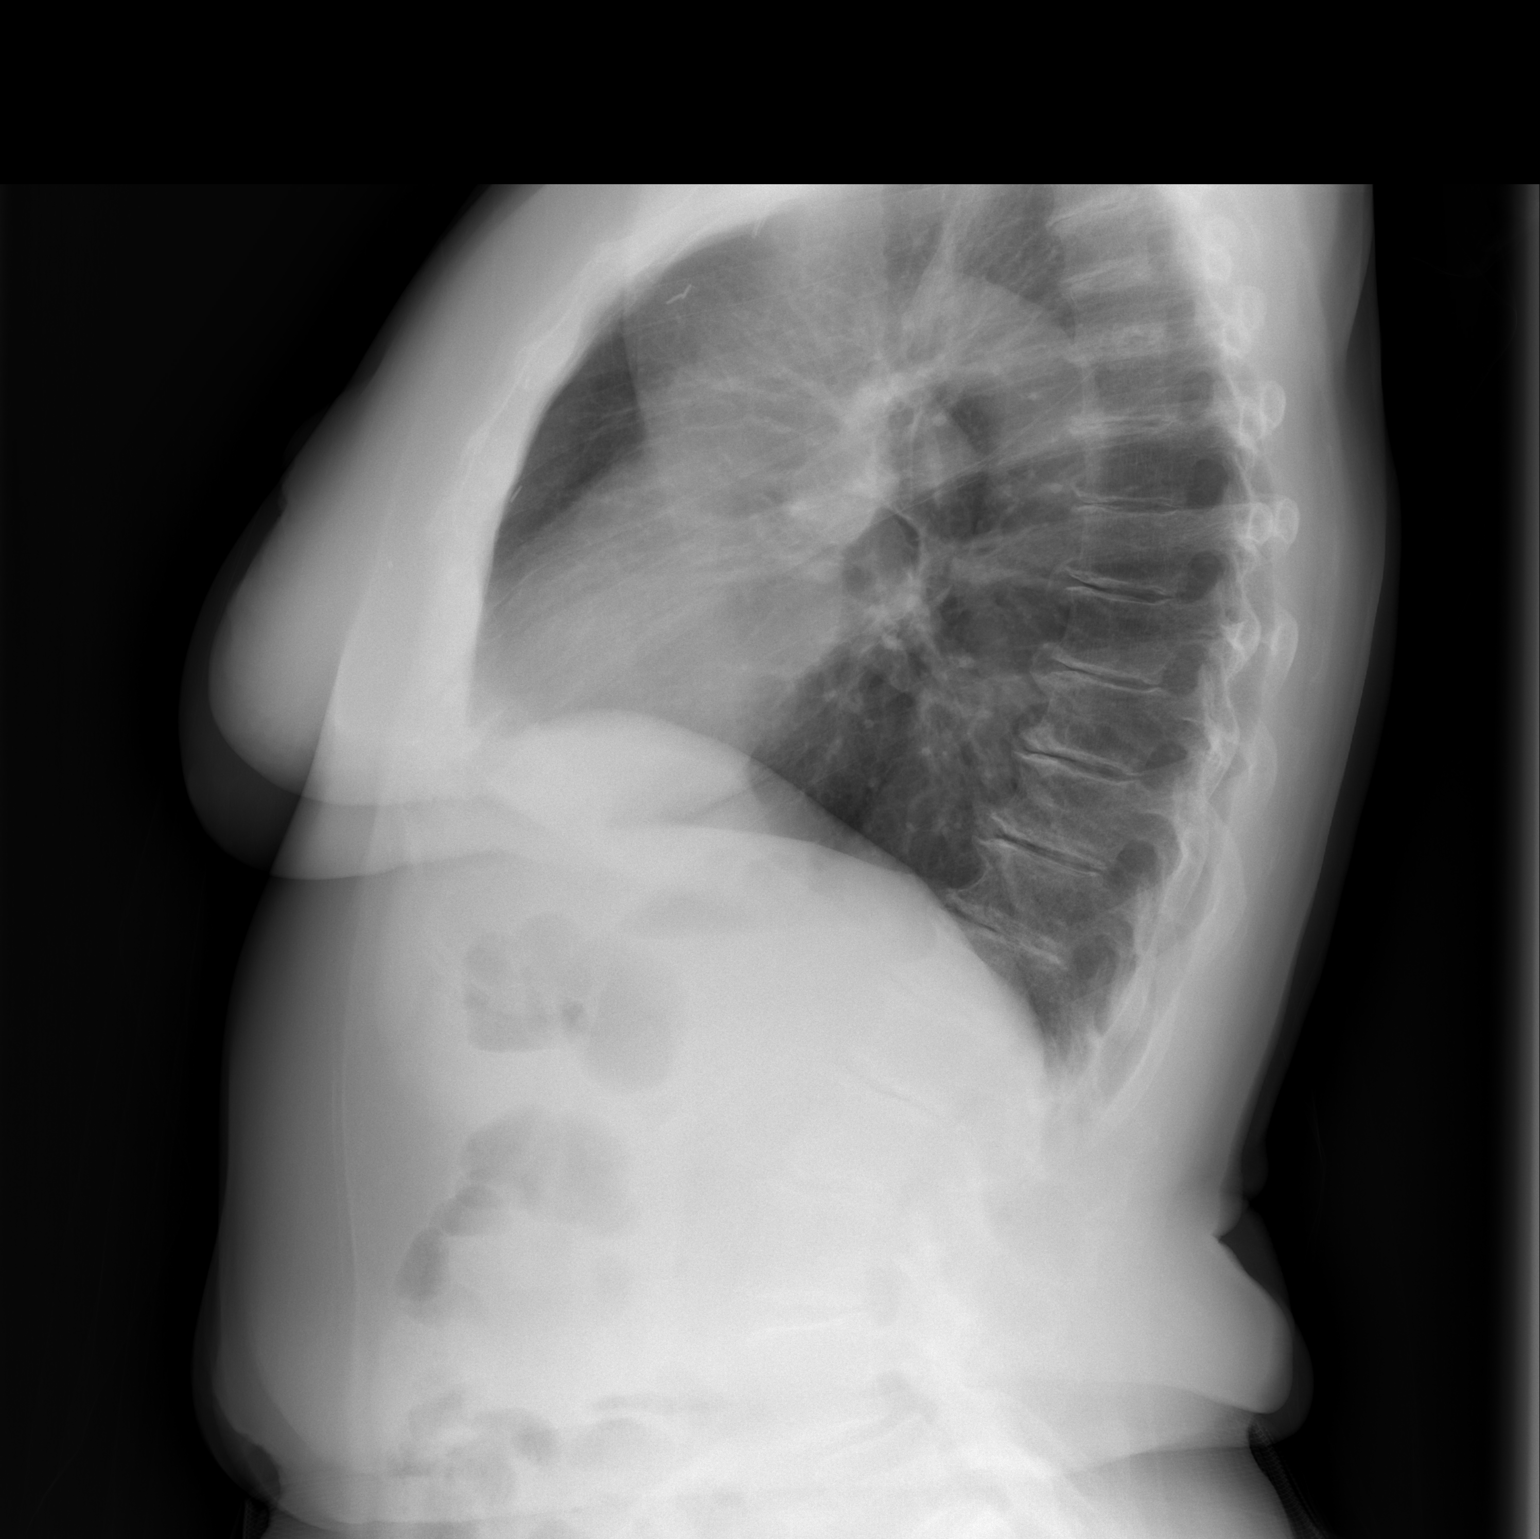

[2 of 2 positions shown; findings below may reference images not displayed]

FINDINGS: Heart and mediastinal contours are within normal limits. No focal
opacities or effusions. No acute bony abnormality. Degenerative
changes and scoliosis in the thoracolumbar spine.
IMPRESSION: No active cardiopulmonary disease.

## 2022-01-05 DIAGNOSIS — Z471 Aftercare following joint replacement surgery: Secondary | ICD-10-CM | POA: Diagnosis not present

## 2022-01-05 DIAGNOSIS — Z96612 Presence of left artificial shoulder joint: Secondary | ICD-10-CM | POA: Diagnosis not present

## 2022-01-06 DIAGNOSIS — Z96612 Presence of left artificial shoulder joint: Secondary | ICD-10-CM | POA: Diagnosis not present

## 2022-01-06 DIAGNOSIS — M25612 Stiffness of left shoulder, not elsewhere classified: Secondary | ICD-10-CM | POA: Diagnosis not present

## 2022-01-06 DIAGNOSIS — R531 Weakness: Secondary | ICD-10-CM | POA: Diagnosis not present

## 2022-01-09 DIAGNOSIS — Z96612 Presence of left artificial shoulder joint: Secondary | ICD-10-CM | POA: Diagnosis not present

## 2022-01-09 DIAGNOSIS — R531 Weakness: Secondary | ICD-10-CM | POA: Diagnosis not present

## 2022-01-09 DIAGNOSIS — M25612 Stiffness of left shoulder, not elsewhere classified: Secondary | ICD-10-CM | POA: Diagnosis not present

## 2022-01-11 ENCOUNTER — Encounter: Payer: Self-pay | Admitting: Family Medicine

## 2022-01-12 ENCOUNTER — Other Ambulatory Visit: Payer: Self-pay | Admitting: Family Medicine

## 2022-01-12 DIAGNOSIS — R531 Weakness: Secondary | ICD-10-CM | POA: Diagnosis not present

## 2022-01-12 DIAGNOSIS — M25612 Stiffness of left shoulder, not elsewhere classified: Secondary | ICD-10-CM | POA: Diagnosis not present

## 2022-01-12 DIAGNOSIS — Z96612 Presence of left artificial shoulder joint: Secondary | ICD-10-CM | POA: Diagnosis not present

## 2022-01-12 DIAGNOSIS — F4310 Post-traumatic stress disorder, unspecified: Secondary | ICD-10-CM

## 2022-01-13 ENCOUNTER — Encounter: Payer: Medicare Other | Admitting: Gastroenterology

## 2022-01-14 IMAGING — DX DG SHOULDER 2+V PORT*R*
1 series · 1 of 1 positions shown · non-contrast
Comparison: None.

CLINICAL DATA: Status post total shoulder arthroplasty

EXAM:
PORTABLE RIGHT SHOULDER: 1 V

[shoulder ap]
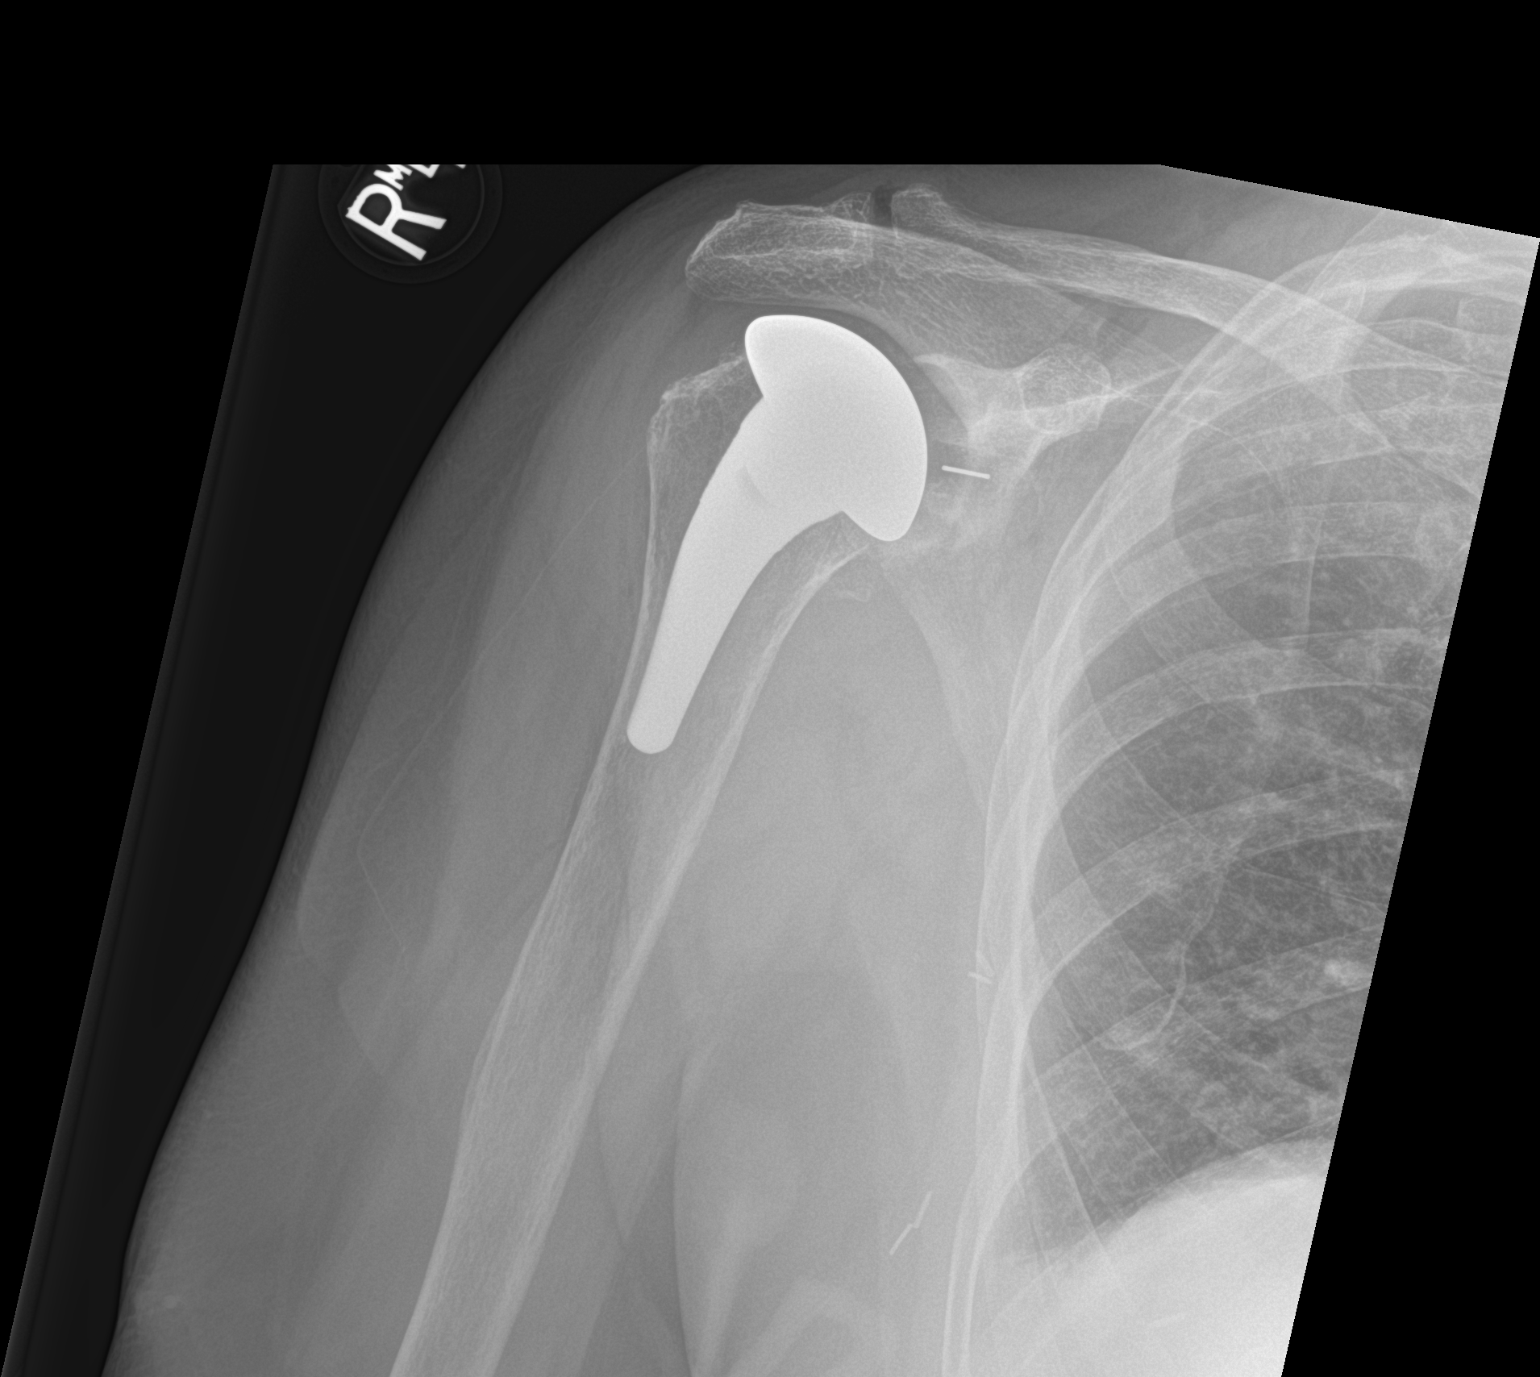

[1 of 1 positions shown; findings below may reference images not displayed]

FINDINGS: Frontal view obtained. Patient is status post total shoulder
arthroplasty with prosthetic component well seated. No fracture or
dislocation. Slight bony overgrowth noted at the acromioclavicular
joint. Visualized right lung clear. Surgical clips in the lateral
right breast region noted.
IMPRESSION: Shoulder replacement with prosthetic components well-seated on
frontal view. No fracture or dislocation. Osteoarthritic change in
acromioclavicular joint.

## 2022-01-15 DIAGNOSIS — Z96612 Presence of left artificial shoulder joint: Secondary | ICD-10-CM | POA: Diagnosis not present

## 2022-01-15 DIAGNOSIS — M25612 Stiffness of left shoulder, not elsewhere classified: Secondary | ICD-10-CM | POA: Diagnosis not present

## 2022-01-15 DIAGNOSIS — R531 Weakness: Secondary | ICD-10-CM | POA: Diagnosis not present

## 2022-01-21 DIAGNOSIS — Z961 Presence of intraocular lens: Secondary | ICD-10-CM | POA: Diagnosis not present

## 2022-01-21 DIAGNOSIS — H40013 Open angle with borderline findings, low risk, bilateral: Secondary | ICD-10-CM | POA: Diagnosis not present

## 2022-02-24 ENCOUNTER — Encounter: Payer: Self-pay | Admitting: Gastroenterology

## 2022-03-13 ENCOUNTER — Ambulatory Visit: Payer: Medicare Other | Admitting: Family Medicine

## 2022-03-13 DIAGNOSIS — Z23 Encounter for immunization: Secondary | ICD-10-CM | POA: Diagnosis not present

## 2022-03-17 ENCOUNTER — Encounter: Payer: Self-pay | Admitting: Family Medicine

## 2022-03-17 ENCOUNTER — Ambulatory Visit (INDEPENDENT_AMBULATORY_CARE_PROVIDER_SITE_OTHER): Payer: Medicare Other | Admitting: Family Medicine

## 2022-03-17 VITALS — BP 120/89 | HR 64 | Temp 98.4°F | Ht 63.0 in | Wt 140.1 lb

## 2022-03-17 DIAGNOSIS — E7800 Pure hypercholesterolemia, unspecified: Secondary | ICD-10-CM | POA: Diagnosis not present

## 2022-03-17 DIAGNOSIS — E1165 Type 2 diabetes mellitus with hyperglycemia: Secondary | ICD-10-CM

## 2022-03-17 LAB — MICROALBUMIN / CREATININE URINE RATIO
Creatinine,U: 48.6 mg/dL
Microalb Creat Ratio: 1.9 mg/g (ref 0.0–30.0)
Microalb, Ur: 0.9 mg/dL (ref 0.0–1.9)

## 2022-03-17 LAB — COMPREHENSIVE METABOLIC PANEL
ALT: 21 U/L (ref 0–35)
AST: 25 U/L (ref 0–37)
Albumin: 4 g/dL (ref 3.5–5.2)
Alkaline Phosphatase: 51 U/L (ref 39–117)
BUN: 7 mg/dL (ref 6–23)
CO2: 27 mEq/L (ref 19–32)
Calcium: 9.1 mg/dL (ref 8.4–10.5)
Chloride: 101 mEq/L (ref 96–112)
Creatinine, Ser: 0.7 mg/dL (ref 0.40–1.20)
GFR: 85.87 mL/min (ref >60.00)
Glucose, Bld: 86 mg/dL (ref 70–99)
Potassium: 4.1 mEq/L (ref 3.5–5.1)
Sodium: 134 mEq/L — ABNORMAL LOW (ref 135–145)
Total Bilirubin: 0.4 mg/dL (ref 0.2–1.2)
Total Protein: 6.4 g/dL (ref 6.0–8.3)

## 2022-03-17 LAB — LIPID PANEL
Cholesterol: 188 mg/dL (ref 0–200)
HDL: 81.3 mg/dL (ref >39.00)
LDL Cholesterol: 88 mg/dL (ref 0–99)
NonHDL: 106.92
Total CHOL/HDL Ratio: 2
Triglycerides: 93 mg/dL (ref 0.0–149.0)
VLDL: 18.6 mg/dL (ref 0.0–40.0)

## 2022-03-17 LAB — HEMOGLOBIN A1C: Hgb A1c MFr Bld: 6.3 % (ref 4.6–6.5)

## 2022-03-17 NOTE — Progress Notes (Signed)
Subjective:   Chief Complaint  Patient presents with   Follow-up    Alisha Matthews is a 72 y.o. female here for follow-up of diabetes.   [name] does not routinely monitor her sugars.  Patient does not require insulin.   Medications include: metformin XR 500 mg bid Diet is healthy.  Exercise: walking, stair stepper  Hyperlipidemia Patient presents for dyslipidemia follow up. Currently being treated with Lipitor 20 mg/d and compliance with treatment thus far has been good. She denies myalgias. Diet/exercise as above.  The patient is not known to have coexisting coronary artery disease. No CP or SOB.   Past Medical History:  Diagnosis Date   Anemia, unspecified    Anxiety    Arthritis    Asthma    mild exercise induced asthma as a child    Barrett's esophagus    Breast cancer ([place])    right breast invasive ductal carcinoma    Breast fibrocystic disorder    Bronchitis    Chicken pox    Constipation, chronic    Depressive disorder, not elsewhere classified    Diabetes mellitus    borderline   Exercise induced bronchospasm    Genital warts    GERD (gastroesophageal reflux disease)    Hyperlipidemia    Infected postoperative breast seroma    Insomnia, unspecified    Migraine, unspecified, without mention of intractable migraine without mention of status migrainosus    Obesity, unspecified    Osteoarthrosis, unspecified whether generalized or localized, ankle and foot    ankle/foot   Personal history of colonic polyps    Symptomatic menopausal or female climacteric states      Related testing: Retinal exam: Done Pneumovax: done  Objective:  BP 120/89 (BP Location: Left Arm, Patient Position: Sitting, Cuff Size: Large)   Pulse 64   Temp 98.4 F (36.9 C) (Oral)   Ht '5\' 3"'$  (1.6 m)   Wt 140 lb 2 oz (63.6 kg)   LMP 06/22/2000   SpO2 98%   BMI 24.82 kg/m  General:  Well developed, well nourished, in no apparent distress Skin:  Warm, no pallor or diaphoresis Lungs:   CTAB, no access msc use Cardio:  RRR, no bruits, no LE edema Psych: Age appropriate judgment and insight  Assessment:   Type 2 diabetes mellitus with hyperglycemia, without long-term current use of insulin (HCC) - Plan: Microalbumin / creatinine urine ratio, Lipid panel, Hemoglobin A1c, Comprehensive metabolic panel  Pure hypercholesterolemia   Plan:   Chronic, stable. Cont Metformin XR 500 mg bid. Counseled on diet and exercise. Chronic, stable. Cont Lipitor 20 mg/d.  F/u in 6 mo. The patient voiced understanding and agreement to the plan.  [place], DO 03/17/22 11:02 AM

## 2022-03-17 NOTE — Patient Instructions (Signed)
Give us 2-3 business days to get the results of your labs back.   Keep the diet clean and stay active.  Let us know if you need anything. 

## 2022-03-18 ENCOUNTER — Encounter: Payer: Self-pay | Admitting: Family Medicine

## 2022-03-18 ENCOUNTER — Other Ambulatory Visit: Payer: Self-pay | Admitting: Family Medicine

## 2022-03-18 ENCOUNTER — Telehealth: Payer: Self-pay | Admitting: Family Medicine

## 2022-03-18 DIAGNOSIS — E871 Hypo-osmolality and hyponatremia: Secondary | ICD-10-CM

## 2022-03-18 NOTE — Telephone Encounter (Signed)
Patient returning Robin's call to discuss labs. Please callback on mobile.

## 2022-03-18 NOTE — Telephone Encounter (Signed)
See result notes. 

## 2022-03-26 ENCOUNTER — Other Ambulatory Visit (INDEPENDENT_AMBULATORY_CARE_PROVIDER_SITE_OTHER): Payer: Medicare Other

## 2022-03-26 DIAGNOSIS — E871 Hypo-osmolality and hyponatremia: Secondary | ICD-10-CM | POA: Diagnosis not present

## 2022-03-26 LAB — BASIC METABOLIC PANEL
BUN: 9 mg/dL (ref 6–23)
CO2: 28 mEq/L (ref 19–32)
Calcium: 8.9 mg/dL (ref 8.4–10.5)
Chloride: 105 mEq/L (ref 96–112)
Creatinine, Ser: 0.81 mg/dL (ref 0.40–1.20)
GFR: 72.06 mL/min (ref >60.00)
Glucose, Bld: 90 mg/dL (ref 70–99)
Potassium: 4.5 mEq/L (ref 3.5–5.1)
Sodium: 139 mEq/L (ref 135–145)

## 2022-04-01 ENCOUNTER — Other Ambulatory Visit: Payer: Self-pay | Admitting: Family Medicine

## 2022-04-01 DIAGNOSIS — F4310 Post-traumatic stress disorder, unspecified: Secondary | ICD-10-CM

## 2022-04-01 MED ORDER — PAROXETINE HCL 20 MG PO TABS: 20.0000 mg | ORAL_TABLET | Freq: Every day | ORAL | 1 refills | Status: DC

## 2022-04-13 DIAGNOSIS — Z96612 Presence of left artificial shoulder joint: Secondary | ICD-10-CM | POA: Diagnosis not present

## 2022-04-13 DIAGNOSIS — M19012 Primary osteoarthritis, left shoulder: Secondary | ICD-10-CM | POA: Diagnosis not present

## 2022-04-21 ENCOUNTER — Telehealth: Payer: Self-pay | Admitting: Family Medicine

## 2022-04-21 DIAGNOSIS — F4310 Post-traumatic stress disorder, unspecified: Secondary | ICD-10-CM

## 2022-04-21 MED ORDER — TRAZODONE HCL 50 MG PO TABS: ORAL_TABLET | ORAL | 2 refills | Status: DC

## 2022-04-21 NOTE — Telephone Encounter (Signed)
Trazodone Refill request Last OV--03/17/2022 Last RF--#90 with 2 Refills on 07/30/2021

## 2022-04-24 DIAGNOSIS — Z23 Encounter for immunization: Secondary | ICD-10-CM | POA: Diagnosis not present

## 2022-04-28 ENCOUNTER — Telehealth: Payer: Self-pay | Admitting: Gastroenterology

## 2022-04-28 ENCOUNTER — Other Ambulatory Visit: Payer: Self-pay | Admitting: Gastroenterology

## 2022-04-28 ENCOUNTER — Encounter: Payer: Self-pay | Admitting: Gastroenterology

## 2022-04-28 ENCOUNTER — Ambulatory Visit (INDEPENDENT_AMBULATORY_CARE_PROVIDER_SITE_OTHER): Payer: Medicare Other | Admitting: Gastroenterology

## 2022-04-28 VITALS — BP 100/70 | HR 70 | Ht 63.0 in | Wt 140.0 lb

## 2022-04-28 DIAGNOSIS — K219 Gastro-esophageal reflux disease without esophagitis: Secondary | ICD-10-CM

## 2022-04-28 DIAGNOSIS — Z1212 Encounter for screening for malignant neoplasm of rectum: Secondary | ICD-10-CM

## 2022-04-28 DIAGNOSIS — K2270 Barrett's esophagus without dysplasia: Secondary | ICD-10-CM | POA: Diagnosis not present

## 2022-04-28 DIAGNOSIS — Z1211 Encounter for screening for malignant neoplasm of colon: Secondary | ICD-10-CM | POA: Diagnosis not present

## 2022-04-28 DIAGNOSIS — R197 Diarrhea, unspecified: Secondary | ICD-10-CM | POA: Diagnosis not present

## 2022-04-28 MED ORDER — NA SULFATE-K SULFATE-MG SULF 17.5-3.13-1.6 GM/177ML PO SOLN: 1.0000 | Freq: Once | ORAL | 0 refills | Status: AC

## 2022-04-28 MED ORDER — NA SULFATE-K SULFATE-MG SULF 17.5-3.13-1.6 GM/177ML PO SOLN: 1.0000 | Freq: Once | ORAL | 0 refills | Status: DC

## 2022-04-28 NOTE — Progress Notes (Signed)
Assessment    Diarrhea and CRC screening, average risk, R/O microscopic colitis, metformin or misoprostol side effect GERD, short segment Barrett's esophagus without dysplasia.   Recommendations   Schedule colonoscopy. The risks (including bleeding, perforation, infection, missed lesions, medication reactions and possible hospitalization or surgery if complications occur), benefits, and alternatives to colonoscopy with possible biopsy and possible polypectomy were discussed with the patient and they consent to proceed.   EGD recommended for Barrett's surveillance. Apparently her EGD recalls were not in our system and this was clearly explained to the patient and her husband.  She was advised on the standard recommendations for Barrett's surveillance however she declines EGD.  Continue esomeprazole 40 mg daily and follow antireflux measures She is advised discuss with her PCP discontinuing metformin and considering an alternative for misoprostol    HPI   Chief complaint: Diarrhea  Patient profile:  Alisha Matthews is a 71 y.o. female referred by Riki Sheer, DO for diarrhea.  She is accompanied by her husband.  She relates worsening problems with intermittent diarrhea for several months.  She states her diet choices may contribute to some of her diarrhea.  She has had occasional episodes of incontinence.  Metformin was held for 2 weeks and she noted some improvement in diarrhea.  She has GERD with short segment Barrett's esophagus without dysplasia diagnosed in 2001 however it appears her recalls for surveillance EGDs are not in the recall system.  Her reflux symptoms are under very good control and have been under good control for many years on daily esomeprazole.  Denies weight loss, abdominal pain, constipation, change in stool caliber, melena, hematochezia, nausea, vomiting, dysphagia, chest pain.   Previous Labs / Imaging::    Latest Ref Rng & Units 09/30/2021   11:32 AM  06/17/2020   11:52 AM 06/06/2019   11:10 AM  CBC  WBC 4.0 - 10.5 K/uL 4.9  4.9  4.5   Hemoglobin 12.0 - 15.0 g/dL 10.1  11.2  12.7   Hematocrit 36.0 - 46.0 % 31.7  34.8  38.1   Platelets 150 - 400 K/uL 238  210  224.0     No results found for: "LIPASE"    Latest Ref Rng & Units 03/26/2022    9:54 AM 03/17/2022   11:13 AM 09/30/2021   11:32 AM  CMP  Glucose 70 - 99 mg/dL 90  86  86   BUN 6 - 23 mg/dL _0 Creatinine 0.40 - 1.20 mg/dL 0.81  0.70  0.67   Sodium 135 - 145 mEq/L 139  134  140   Potassium 3.5 - 5.1 mEq/L 4.5  4.1  4.3   Chloride 96 - 112 mEq/L 105  101  110   CO2 19 - 32 mEq/L _1 Calcium 8.4 - 10.5 mg/dL 8.9  9.1  9.2   Total Protein 6.0 - 8.3 g/dL  6.4    Total Bilirubin 0.2 - 1.2 mg/dL  0.4    Alkaline Phos 39 - 117 U/L  51    AST 0 - 37 U/L  25    ALT 0 - 35 U/L  21      Previous GI evaluation    Endoscopies:  Colon Mar 2013: diverticulosis, internal/external hemorrhoids  EGD Sept 2001: short segment Barrett's without dysplasia  Imaging:     Past Medical History:  Diagnosis Date   Anemia, unspecified    Anxiety  Arthritis    Asthma    mild exercise induced asthma as a child    Barrett's esophagus    Breast cancer ([place])    right breast invasive ductal carcinoma    Breast fibrocystic disorder    Bronchitis    Chicken pox    Constipation, chronic    Depressive disorder, not elsewhere classified    Diabetes mellitus    borderline   Exercise induced bronchospasm    Genital warts    GERD (gastroesophageal reflux disease)    Hyperlipidemia    Infected postoperative breast seroma    Insomnia, unspecified    Migraine, unspecified, without mention of intractable migraine without mention of status migrainosus    Obesity, unspecified    Osteoarthrosis, unspecified whether generalized or localized, ankle and foot    ankle/foot   Personal history of colonic polyps    Symptomatic menopausal or female climacteric states    Past  Surgical History:  Procedure Laterality Date   BREAST LUMPECTOMY WITH NEEDLE LOCALIZATION AND AXILLARY SENTINEL LYMPH NODE BX  06/08/2012   Procedure: BREAST LUMPECTOMY WITH NEEDLE LOCALIZATION AND AXILLARY SENTINEL LYMPH NODE BX;  Surgeon: Rolm Bookbinder, MD;  Location: [facility];  Service: General;  Laterality: Right;   BREAST SURGERY     BUNIONECTOMY     3 surgeries on both feet   CATARACT EXTRACTION, BILATERAL     2022. 2023   CHOLECYSTECTOMY     COLONOSCOPY     dental implant     EYE SURGERY     Orbital right eye surgery   FOOT SURGERY     Left-revision   JOINT REPLACEMENT     NASAL SEPTUM SURGERY     RE-EXCISION OF BREAST CANCER,SUPERIOR MARGINS  06/27/2012   Procedure: RE-EXCISION OF BREAST CANCER,SUPERIOR MARGINS;  Surgeon: Rolm Bookbinder, MD;  Location: [facility];  Service: General;  Laterality: Right;   REVERSE SHOULDER ARTHROPLASTY Left 10/09/2021   Procedure: REVERSE SHOULDER ARTHROPLASTY;  Surgeon: Tania Ade, MD;  Location: [facility];  Service: Orthopedics;  Laterality: Left;   TOTAL HIP ARTHROPLASTY     left      Perthes disease   TOTAL KNEE ARTHROPLASTY Left 02/20/2014   Procedure: TOTAL KNEE ARTHROPLASTY;  Surgeon: Hessie Dibble, MD;  Location: [facility];  Service: Orthopedics;  Laterality: Left;   TOTAL SHOULDER ARTHROPLASTY Right 06/27/2020   Procedure: TOTAL SHOULDER ARTHROPLASTY;  Surgeon: Tania Ade, MD;  Location: [facility];  Service: Orthopedics;  Laterality: Right;   Family History  Adopted: Yes  Problem Relation Age of Onset   Hypertension Mother    Diabetes Mother    Heart failure Mother    Heart disease Mother    Hyperlipidemia Mother    Stroke Mother    Mental illness Mother    Cancer Maternal Grandmother 23       non hodgkins lymphoma   Cancer Maternal Grandfather    Deep vein thrombosis Brother    Social History   Tobacco Use   Smoking status: Never   Smokeless tobacco: Never  Vaping Use    Vaping Use: Never used  Substance Use Topics   Alcohol use: No   Drug use: No   Current Outpatient Medications  Medication Sig Dispense Refill   ALPRAZolam (XANAX) 0.25 MG tablet Take 1 tablet (0.25 mg total) by mouth at bedtime. 30 tablet 5   atorvastatin (LIPITOR) 20 MG tablet TAKE 1 TABLET BY MOUTH DAILY 90 tablet 3   Blood Glucose Monitoring  Suppl (ACCU-CHEK GUIDE ME) w/Device KIT USE DAILY TO CHECK BLOOD SUGAR. DX E11.9 1 kit 0   Calcium-Magnesium-Vitamin D (CALCIUM 500 PO) Take 500 mg by mouth 3 (three) times daily.     clindamycin (CLEOCIN) 150 MG capsule Take 600 mg by mouth See admin instructions. Take 600 mg by mouth prior to dental procedures     Coenzyme Q10 (COQ10) 200 MG CAPS Take 200 mg by mouth daily.     esomeprazole (NEXIUM) 40 MG capsule TAKE 1 CAPSULE BY MOUTH DAILY 90 capsule 3   frovatriptan (FROVA) 2.5 MG tablet Take 1 tablet (2.5 mg total) by mouth as needed for migraine (max 3 tabs in 24 hours). If recurs, may repeat after 2 hours. Max of 3 tabs in 24 hours. 10 tablet 1   LevOCARNitine (CARNITINE PO) Take 3,000 mg by mouth daily.      metFORMIN (GLUCOPHAGE-XR) 500 MG 24 hr tablet TAKE 1 TABLET BY MOUTH 2 TIMES DAILY 180 tablet 3   misoprostol (CYTOTEC) 200 MCG tablet TAKE 1 TABLET BY MOUTH 2 TIMES DAILY. 180 tablet 2   Multiple Vitamins-Minerals (ANTIOXIDANT PO) Take 1 capsule by mouth daily. Fruit     OVER THE COUNTER MEDICATION Take 1 capsule by mouth daily. Procaps package: Elite-100 omega 3 vitamin     OVER THE COUNTER MEDICATION Take 1 capsule by mouth daily. Procaps: Memory,brain and liver vitamin     OVER THE COUNTER MEDICATION Take 1 capsule by mouth daily. Circulation and vein support vitamin     PARoxetine (PAXIL) 20 MG tablet Take 1 tablet (20 mg total) by mouth daily. 90 tablet 1   Probiotic Product (PROBIOTIC DAILY PO) Take 1 capsule by mouth daily.     tiZANidine (ZANAFLEX) 2 MG tablet Take 1 tablet (2 mg total) by mouth every 8 (eight) hours as needed  for muscle spasms. 20 tablet 0   traZODone (DESYREL) 50 MG tablet TAKE 1/2 TO 1 TABLET BY MOUTH AT BEDTIMEAS NEEDED FOR SLEEP 90 tablet 2   No current facility-administered medications for this visit.   Allergies  Allergen Reactions   Penicillins Rash    Review of Systems: All other systems reviewed and negative except where noted in HPI.    Physical Exam    Wt Readings from Last 3 Encounters:  04/28/22 140 lb (63.5 kg)  03/17/22 140 lb 2 oz (63.6 kg)  12/10/21 140 lb (63.5 kg)    BP 100/70   Pulse 70   Ht _0  (1.6 m)   Wt 140 lb (63.5 kg)   LMP 06/22/2000   BMI 24.80 kg/m  Constitutional:  Generally well appearing female in no acute distress. Psychiatric: Pleasant. Normal mood and affect. Behavior is normal. HEENT: Pupils normal.  Conjunctivae are normal. No scleral icterus. Neck supple.  Cardiovascular: Normal rate, regular rhythm. No edema Pulmonary/chest: Effort normal and breath sounds normal. No wheezing, rales or rhonchi. Abdominal: Soft, nondistended, nontender. Bowel sounds active throughout. There are no masses palpable. No hepatomegaly. Rectal: Deferred to colonoscopy Neurological: Alert and oriented to person place and time. Skin: Skin is warm and dry. No rashes noted.  Lucio Edward, MD   cc:  Referring Provider Shelda Pal*

## 2022-04-28 NOTE — Patient Instructions (Signed)
You have been scheduled for a colonoscopy. Please follow written instructions given to you at your visit today.  Please pick up your prep supplies at the pharmacy within the next 1-3 days. If you use inhalers (even only as needed), please bring them with you on the day of your procedure.  The [facility] providers would like to encourage you to use MYCHART to communicate with providers for non-urgent requests or questions.  Due to long hold times on the telephone, sending your provider a message by MYCHART may be a faster and more efficient way to get a response.  Please allow 48 business hours for a response.  Please remember that this is for non-urgent requests.   Due to recent changes in healthcare laws, you may see the results of your imaging and laboratory studies on MyChart before your provider has had a chance to review them.  We understand that in some cases there may be results that are confusing or concerning to you. Not all laboratory results come back in the same time frame and the provider may be waiting for multiple results in order to interpret others.  Please give us 48 hours in order for your provider to thoroughly review all the results before contacting the office for clarification of your results.   Thank you for choosing me and [facility].  Malcolm T. Stark, Jr., MD., FACG  

## 2022-04-28 NOTE — Telephone Encounter (Signed)
Prescription sent to patient's pharmacy per patient's request. 

## 2022-04-28 NOTE — Telephone Encounter (Signed)
Patient called states she cancelled her medication at the pharmacy because they are charging her double. States she would like the medication to be send to [facility] on [address].

## 2022-05-21 ENCOUNTER — Encounter: Payer: Medicare Other | Admitting: Gastroenterology

## 2022-05-25 DIAGNOSIS — M25562 Pain in left knee: Secondary | ICD-10-CM | POA: Diagnosis not present

## 2022-05-25 DIAGNOSIS — M25552 Pain in left hip: Secondary | ICD-10-CM | POA: Diagnosis not present

## 2022-06-03 ENCOUNTER — Telehealth: Payer: Self-pay | Admitting: Family Medicine

## 2022-06-03 ENCOUNTER — Other Ambulatory Visit: Payer: Self-pay | Admitting: Family Medicine

## 2022-06-03 DIAGNOSIS — F4310 Post-traumatic stress disorder, unspecified: Secondary | ICD-10-CM

## 2022-06-03 MED ORDER — ALPRAZOLAM 0.25 MG PO TABS: 0.2500 mg | ORAL_TABLET | Freq: Every day | ORAL | 5 refills | Status: DC

## 2022-06-03 NOTE — Telephone Encounter (Signed)
Refill Request for Alprazolam Last OV---03/17/2022 Last RF---#30 with 5 refills on 12/01/2021 UDS/CSC--12/04/2020

## 2022-06-19 ENCOUNTER — Other Ambulatory Visit: Payer: Self-pay

## 2022-06-19 DIAGNOSIS — E1165 Type 2 diabetes mellitus with hyperglycemia: Secondary | ICD-10-CM

## 2022-06-19 MED ORDER — METFORMIN HCL ER 500 MG PO TB24: 500.0000 mg | ORAL_TABLET | Freq: Two times a day (BID) | ORAL | 0 refills | Status: DC

## 2022-06-25 DIAGNOSIS — Z1231 Encounter for screening mammogram for malignant neoplasm of breast: Secondary | ICD-10-CM | POA: Diagnosis not present

## 2022-06-25 LAB — HM MAMMOGRAPHY

## 2022-06-26 ENCOUNTER — Encounter: Payer: Self-pay | Admitting: Family Medicine

## 2022-06-29 DIAGNOSIS — E119 Type 2 diabetes mellitus without complications: Secondary | ICD-10-CM | POA: Diagnosis not present

## 2022-06-29 DIAGNOSIS — R197 Diarrhea, unspecified: Secondary | ICD-10-CM | POA: Diagnosis not present

## 2022-06-29 DIAGNOSIS — E669 Obesity, unspecified: Secondary | ICD-10-CM | POA: Diagnosis not present

## 2022-06-29 DIAGNOSIS — F419 Anxiety disorder, unspecified: Secondary | ICD-10-CM | POA: Diagnosis not present

## 2022-06-29 DIAGNOSIS — F329 Major depressive disorder, single episode, unspecified: Secondary | ICD-10-CM | POA: Diagnosis not present

## 2022-06-30 ENCOUNTER — Encounter: Payer: Self-pay | Admitting: Gastroenterology

## 2022-06-30 ENCOUNTER — Ambulatory Visit (AMBULATORY_SURGERY_CENTER): Payer: Medicare Other | Admitting: Gastroenterology

## 2022-06-30 VITALS — BP 122/58 | HR 62 | Temp 96.8°F | Resp 16 | Ht 63.0 in | Wt 140.0 lb

## 2022-06-30 DIAGNOSIS — K52831 Collagenous colitis: Secondary | ICD-10-CM | POA: Diagnosis not present

## 2022-06-30 DIAGNOSIS — K6389 Other specified diseases of intestine: Secondary | ICD-10-CM | POA: Diagnosis not present

## 2022-06-30 DIAGNOSIS — R197 Diarrhea, unspecified: Secondary | ICD-10-CM

## 2022-06-30 DIAGNOSIS — K640 First degree hemorrhoids: Secondary | ICD-10-CM

## 2022-06-30 DIAGNOSIS — K5730 Diverticulosis of large intestine without perforation or abscess without bleeding: Secondary | ICD-10-CM

## 2022-06-30 MED ORDER — SODIUM CHLORIDE 0.9 % IV SOLN: 500.0000 mL | Freq: Once | INTRAVENOUS | Status: DC

## 2022-06-30 NOTE — Op Note (Signed)
[facility] Patient Name: Alisha Matthews Procedure Date: 06/30/2022 10:05 AM MRN: [id number] Endoscopist: Ladene Artist , MD, [id number] Age: 72 Referring MD:  Date of Birth: Aug 14, 1950 Gender: Female Account #: [id number] Procedure:                Colonoscopy Indications:              Clinically significant diarrhea of unexplained                            origin Medicines:                Monitored Anesthesia Care Procedure:                Pre-Anesthesia Assessment:                           - Prior to the procedure, a History and Physical                            was performed, and patient medications and                            allergies were reviewed. The patient's tolerance of                            previous anesthesia was also reviewed. The risks                            and benefits of the procedure and the sedation                            options and risks were discussed with the patient.                            All questions were answered, and informed consent                            was obtained. Prior Anticoagulants: The patient has                            taken no anticoagulant or antiplatelet agents. ASA                            Grade Assessment: III - A patient with severe                            systemic disease. After reviewing the risks and                            benefits, the patient was deemed in satisfactory                            condition to undergo the procedure.  After obtaining informed consent, the colonoscope                            was passed under direct vision. Throughout the                            procedure, the patient's blood pressure, pulse, and                            oxygen saturations were monitored continuously. The                            [id number]-HQ190L Colonoscope was introduced through the                            anus and advanced to the the terminal ileum, with                             identification of the appendiceal orifice and IC                            valve. The terminal ileum, ileocecal valve,                            appendiceal orifice, and rectum were photographed.                            The quality of the bowel preparation was good. The                            colonoscopy was performed without difficulty. The                            patient tolerated the procedure well. Scope In: 10:23:25 AM Scope Out: 10:38:03 AM Scope Withdrawal Time: 0 hours 10 minutes 11 seconds  Total Procedure Duration: 0 hours 14 minutes 38 seconds  Findings:                 The perianal and digital rectal examinations were                            normal.                           The terminal ileum appeared normal.                           A few small-mouthed diverticula were found in the                            left colon. There was no evidence of diverticular                            bleeding.  Internal hemorrhoids were found during                            retroflexion. The hemorrhoids were moderate and                            Grade I (internal hemorrhoids that do not prolapse).                           The exam was otherwise without abnormality on                            direct and retroflexion views. Random biopsies                            obtained. Complications:            No immediate complications. Estimated blood loss:                            None. Estimated Blood Loss:     Estimated blood loss: none. Impression:               - The examined portion of the ileum was normal.                           - Mild diverticulosis in the left colon.                           - Internal hemorrhoids.                           - The examination was otherwise normal on direct                            and retroflexion views. Random biopsies obtained. Recommendation:           - Repeat colonoscopy  after studies are complete for                            surveillance based on pathology results.                           - Patient has a contact number available for                            emergencies. The signs and symptoms of potential                            delayed complications were discussed with the                            patient. Return to normal activities tomorrow.                            Written discharge instructions were provided to the  patient.                           - Resume previous diet.                           - Continue present medications.                           - Await pathology results. Ladene Artist, MD 06/30/2022 10:45:04 AM This report has been signed electronically.

## 2022-06-30 NOTE — Progress Notes (Signed)
History & Physical  Primary Care Physician:  Shelda Pal, DO Primary Gastroenterologist: Lucio Edward, MD  CHIEF COMPLAINT:  diarrhea   HPI: [name] is a 72 y.o. female with unexplained diarrhea for colonoscopy.   Past Medical History:  Diagnosis Date   Anemia, unspecified    Anxiety    Arthritis    Asthma    mild exercise induced asthma as a child    Barrett's esophagus    Breast cancer ([place])    right breast invasive ductal carcinoma    Breast fibrocystic disorder    Bronchitis    Chicken pox    Constipation, chronic    Depressive disorder, not elsewhere classified    Diabetes mellitus    borderline   Exercise induced bronchospasm    Genital warts    GERD (gastroesophageal reflux disease)    Hyperlipidemia    Infected postoperative breast seroma    Insomnia, unspecified    Migraine, unspecified, without mention of intractable migraine without mention of status migrainosus    Obesity, unspecified    Osteoarthrosis, unspecified whether generalized or localized, ankle and foot    ankle/foot   Personal history of colonic polyps    Symptomatic menopausal or female climacteric states     Past Surgical History:  Procedure Laterality Date   BREAST LUMPECTOMY WITH NEEDLE LOCALIZATION AND AXILLARY SENTINEL LYMPH NODE BX  06/08/2012   Procedure: BREAST LUMPECTOMY WITH NEEDLE LOCALIZATION AND AXILLARY SENTINEL LYMPH NODE BX;  Surgeon: Rolm Bookbinder, MD;  Location: [facility];  Service: General;  Laterality: Right;   BREAST SURGERY     BUNIONECTOMY     3 surgeries on both feet   CATARACT EXTRACTION, BILATERAL     2022. 2023   CHOLECYSTECTOMY     COLONOSCOPY     dental implant     EYE SURGERY     Orbital right eye surgery   FOOT SURGERY     Left-revision   JOINT REPLACEMENT     NASAL SEPTUM SURGERY     RE-EXCISION OF BREAST CANCER,SUPERIOR MARGINS  06/27/2012   Procedure: RE-EXCISION OF BREAST CANCER,SUPERIOR MARGINS;  Surgeon:  Rolm Bookbinder, MD;  Location: [facility];  Service: General;  Laterality: Right;   REVERSE SHOULDER ARTHROPLASTY Left 10/09/2021   Procedure: REVERSE SHOULDER ARTHROPLASTY;  Surgeon: Tania Ade, MD;  Location: [facility];  Service: Orthopedics;  Laterality: Left;   TOTAL HIP ARTHROPLASTY     left      Perthes disease   TOTAL KNEE ARTHROPLASTY Left 02/20/2014   Procedure: TOTAL KNEE ARTHROPLASTY;  Surgeon: Hessie Dibble, MD;  Location: [facility];  Service: Orthopedics;  Laterality: Left;   TOTAL SHOULDER ARTHROPLASTY Right 06/27/2020   Procedure: TOTAL SHOULDER ARTHROPLASTY;  Surgeon: Tania Ade, MD;  Location: [facility];  Service: Orthopedics;  Laterality: Right;    Prior to Admission medications   Medication Sig Start Date End Date Taking? Authorizing Provider  atorvastatin (LIPITOR) 20 MG tablet TAKE 1 TABLET BY MOUTH DAILY 07/24/21  Yes Shelda Pal, DO  Calcium-Magnesium-Vitamin D (CALCIUM 500 PO) Take 500 mg by mouth 3 (three) times daily.   Yes [provider]  Coenzyme Q10 (COQ10) 200 MG CAPS Take 200 mg by mouth daily.   Yes [provider]  esomeprazole (NEXIUM) 40 MG capsule TAKE 1 CAPSULE BY MOUTH DAILY 07/02/21  Yes Shelda Pal, DO  frovatriptan (FROVA) 2.5 MG tablet Take 1 tablet (2.5 mg total) by mouth as needed for  migraine (max 3 tabs in 24 hours). If recurs, may repeat after 2 hours. Max of 3 tabs in 24 hours. 03/02/17  Yes Wardell Honour, MD  LevOCARNitine (CARNITINE PO) Take 3,000 mg by mouth daily.    Yes [provider]  metFORMIN (GLUCOPHAGE-XR) 500 MG 24 hr tablet Take 1 tablet (500 mg total) by mouth 2 (two) times daily. 06/19/22  Yes Shelda Pal, DO  misoprostol (CYTOTEC) 200 MCG tablet TAKE 1 TABLET BY MOUTH 2 TIMES DAILY. 11/11/21  Yes Shelda Pal, DO  Multiple Vitamins-Minerals (ANTIOXIDANT PO) Take 1 capsule by mouth daily. Fruit   Yes [provider]  OVER THE  COUNTER MEDICATION Take 1 capsule by mouth daily. Procaps package: Elite-100 omega 3 vitamin   Yes [provider]  OVER THE COUNTER MEDICATION Take 1 capsule by mouth daily. Procaps: Memory,brain and liver vitamin   Yes [provider]  OVER THE COUNTER MEDICATION Take 1 capsule by mouth daily. Circulation and vein support vitamin   Yes [provider]  PARoxetine (PAXIL) 20 MG tablet Take 1 tablet (20 mg total) by mouth daily. 04/01/22  Yes Shelda Pal, DO  Probiotic Product (PROBIOTIC DAILY PO) Take 1 capsule by mouth daily.   Yes [provider]  ALPRAZolam (XANAX) 0.25 MG tablet Take 1 tablet (0.25 mg total) by mouth at bedtime. 06/03/22   Shelda Pal, DO  Blood Glucose Monitoring Suppl (ACCU-CHEK GUIDE ME) w/Device KIT USE DAILY TO CHECK BLOOD SUGAR. DX E11.9 03/17/21   Shelda Pal, DO  clindamycin (CLEOCIN) 150 MG capsule Take 600 mg by mouth See admin instructions. Take 600 mg by mouth prior to dental procedures    [provider]  tiZANidine (ZANAFLEX) 2 MG tablet Take 1 tablet (2 mg total) by mouth every 8 (eight) hours as needed for muscle spasms. 10/09/21 10/09/22  P[place]field, Amber, PA-C  traZODone (DESYREL) 50 MG tablet TAKE 1/2 TO 1 TABLET BY MOUTH AT [facility] NEEDED FOR SLEEP 04/21/22   Shelda Pal, DO    Current Outpatient Medications  Medication Sig Dispense Refill   atorvastatin (LIPITOR) 20 MG tablet TAKE 1 TABLET BY MOUTH DAILY 90 tablet 3   Calcium-Magnesium-Vitamin D (CALCIUM 500 PO) Take 500 mg by mouth 3 (three) times daily.     Coenzyme Q10 (COQ10) 200 MG CAPS Take 200 mg by mouth daily.     esomeprazole (NEXIUM) 40 MG capsule TAKE 1 CAPSULE BY MOUTH DAILY 90 capsule 3   frovatriptan (FROVA) 2.5 MG tablet Take 1 tablet (2.5 mg total) by mouth as needed for migraine (max 3 tabs in 24 hours). If recurs, may repeat after 2 hours. Max of 3 tabs in 24 hours. 10 tablet 1   LevOCARNitine  (CARNITINE PO) Take 3,000 mg by mouth daily.      metFORMIN (GLUCOPHAGE-XR) 500 MG 24 hr tablet Take 1 tablet (500 mg total) by mouth 2 (two) times daily. 180 tablet 0   misoprostol (CYTOTEC) 200 MCG tablet TAKE 1 TABLET BY MOUTH 2 TIMES DAILY. 180 tablet 2   Multiple Vitamins-Minerals (ANTIOXIDANT PO) Take 1 capsule by mouth daily. Fruit     OVER THE COUNTER MEDICATION Take 1 capsule by mouth daily. Procaps package: Elite-100 omega 3 vitamin     OVER THE COUNTER MEDICATION Take 1 capsule by mouth daily. Procaps: Memory,brain and liver vitamin     OVER THE COUNTER MEDICATION Take 1 capsule by mouth daily. Circulation and vein support vitamin  PARoxetine (PAXIL) 20 MG tablet Take 1 tablet (20 mg total) by mouth daily. 90 tablet 1   Probiotic Product (PROBIOTIC DAILY PO) Take 1 capsule by mouth daily.     ALPRAZolam (XANAX) 0.25 MG tablet Take 1 tablet (0.25 mg total) by mouth at bedtime. 30 tablet 5   Blood Glucose Monitoring Suppl (ACCU-CHEK GUIDE ME) w/Device KIT USE DAILY TO CHECK BLOOD SUGAR. DX E11.9 1 kit 0   clindamycin (CLEOCIN) 150 MG capsule Take 600 mg by mouth See admin instructions. Take 600 mg by mouth prior to dental procedures     tiZANidine (ZANAFLEX) 2 MG tablet Take 1 tablet (2 mg total) by mouth every 8 (eight) hours as needed for muscle spasms. 20 tablet 0   traZODone (DESYREL) 50 MG tablet TAKE 1/2 TO 1 TABLET BY MOUTH AT BEDTIMEAS NEEDED FOR SLEEP 90 tablet 2   Current Facility-Administered Medications  Medication Dose Route Frequency Provider Last Rate Last Admin   0.9 %  sodium chloride infusion  500 mL Intravenous Once Ladene Artist, MD        Allergies as of 06/30/2022 - Review Complete 06/30/2022  Allergen Reaction Noted   Penicillins Rash 08/04/2011    Family History  Adopted: Yes  Problem Relation Age of Onset   Hypertension Mother    Diabetes Mother    Heart failure Mother    Heart disease Mother    Hyperlipidemia Mother    Stroke Mother    Mental  illness Mother    Cancer Maternal Grandmother 25       non hodgkins lymphoma   Cancer Maternal Grandfather    Deep vein thrombosis Brother     Social History   Socioeconomic History   Marital status: Married    Spouse name: Shanon Brow   Number of children: 0   Years of education: college   Highest education level: Not on file  Occupational History   Occupation: retired    Fish farm manager: [employer]    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 No domestic abuse; 5 cats; no children.      Lives: with husband, 5 cats.      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      Tobacco: none       Alcohol: none      Drugs: none      Exercise:  Stair stepper x 30 minutes five days per week.  Spin gym weights daily.  Gardening.      Caffeine: + Moderate caffeine use.          Seatbelt: 100%; no texting while driving.      Guns:  Unloaded gun.      Advanced Directives:  Living Will --- FULL CODE but no prolonged measures.        ADLs: independent with ADLs; no assistant devices; drives.   Social Determinants of Health   Financial Resource Strain: Low Risk  (12/04/2020)   Overall  Financial Resource Strain (CARDIA)    Difficulty of Paying Living Expenses: Not hard at all  Food Insecurity: No Food Insecurity (12/04/2020)   Hunger Vital Sign    Worried About Running Out of Food in the Last Year: Never true    Ran Out of Food in the Last Year: Never true  Transportation Needs: No Transportation Needs (12/04/2020)   PRAPARE - Radiographer, therapeutic (Medical): No    Lack of Transportation (Non-Medical): No  Physical Activity: Sufficiently Active (12/04/2020)   Exercise Vital Sign    Days of Exercise per Week: 7 days    Minutes of Exercise per Session: 30 min  Stress: No Stress Concern Present (12/04/2020)   [place]    Feeling of Stress : Not at all  Social Connections: Moderately Isolated (12/04/2020)   Social Connection and Isolation Panel [NHANES]    Frequency of Communication with Friends and Family: More than three times a week    Frequency of Social Gatherings with Friends and Family: More than three times a week    Attends Religious Services: Never    Marine scientist or Organizations: No    Attends Archivist Meetings: Never    Marital Status: Married  Human resources officer Violence: Not At Risk (12/04/2020)   Humiliation, Afraid, Rape, and Kick questionnaire    Fear of Current or Ex-Partner: No    Emotionally Abused: No    Physically Abused: No    Sexually Abused: No    Review of Systems:  All systems reviewed were negative except where noted in HPI.   Physical Exam: General:  Alert, well-developed, in NAD Head:  Normocephalic and atraumatic. Eyes:  Sclera clear, no icterus.   Conjunctiva pink. Ears:  Normal auditory acuity. Mouth:  No deformity or lesions.  Neck:  Supple; no masses . Lungs:  Clear throughout to auscultation.   No wheezes, crackles, or rhonchi. No acute distress. Heart:  Regular rate and rhythm; no murmurs. Abdomen:  Soft, nondistended, nontender. No masses, hepatomegaly. No obvious masses.  Normal bowel .    Rectal:  Deferred   Msk:  Symmetrical without gross deformities.. Pulses:  Normal pulses noted. Extremities:  Without edema. Neurologic:  Alert and  oriented x4;  grossly normal neurologically. Skin:  Intact without significant lesions or rashes. Psych:  Alert and cooperative. Normal mood and  affect.   Impression / Plan:   Unexplained diarrhea for colonoscopy.  Pricilla Riffle. [place] Plan  06/30/2022, 10:11 AM See Shea Evans, [facility], to contact our on call provider

## 2022-06-30 NOTE — Progress Notes (Signed)
A and O x3. Report to RN. Tolerated MAC anesthesia well. 

## 2022-06-30 NOTE — Progress Notes (Signed)
Pt's states no medical or surgical changes since previsit or office visit. 

## 2022-06-30 NOTE — Progress Notes (Signed)
Called to room to assist during endoscopic procedure.  Patient ID and intended procedure confirmed with present staff. Received instructions for my participation in the procedure from the performing physician.  

## 2022-06-30 NOTE — Patient Instructions (Signed)
YOU HAD AN ENDOSCOPIC PROCEDURE TODAY AT THE [facility]:   Refer to the procedure report that was given to you for any specific questions about what was found during the examination.  If the procedure report does not answer your questions, please call your gastroenterologist to clarify.  If you requested that your care partner not be given the details of your procedure findings, then the procedure report has been included in a sealed envelope for you to review at your convenience later.  YOU SHOULD EXPECT: Some feelings of bloating in the abdomen. Passage of more gas than usual.  Walking can help get rid of the air that was put into your GI tract during the procedure and reduce the bloating. If you had a lower endoscopy (such as a colonoscopy or flexible sigmoidoscopy) you may notice spotting of blood in your stool or on the toilet paper. If you underwent a bowel prep for your procedure, you may not have a normal bowel movement for a few days.  Please Note:  You might notice some irritation and congestion in your nose or some drainage.  This is from the oxygen used during your procedure.  There is no need for concern and it should clear up in a day or so.  SYMPTOMS TO REPORT IMMEDIATELY:  Following lower endoscopy (colonoscopy or flexible sigmoidoscopy):  Excessive amounts of blood in the stool  Significant tenderness or worsening of abdominal pains  Swelling of the abdomen that is new, acute  Fever of 100F or higher  For urgent or emergent issues, a gastroenterologist can be reached at any hour by calling [phone]. Do not use MyChart messaging for urgent concerns.    DIET:  We do recommend a small meal at first, but then you may proceed to your regular diet.  Drink plenty of fluids but you should avoid alcoholic beverages for 24 hours.  ACTIVITY:  You should plan to take it easy for the rest of today and you should NOT DRIVE or use heavy machinery until tomorrow (because of  the sedation medicines used during the test).    FOLLOW UP: Our staff will call the number listed on your records the next business day following your procedure.  We will call around 7:15- 8:00 am to check on you and address any questions or concerns that you may have regarding the information given to you following your procedure. If we do not reach you, we will leave a message.     If any biopsies were taken you will be contacted by phone or by letter within the next 1-3 weeks.  Please call us at [phone] if you have not heard about the biopsies in 3 weeks.    SIGNATURES/CONFIDENTIALITY: You and/or your care partner have signed paperwork which will be entered into your electronic medical record.  These signatures attest to the fact that that the information above on your After Visit Summary has been reviewed and is understood.  Full responsibility of the confidentiality of this discharge information lies with you and/or your care-partner.  

## 2022-07-01 ENCOUNTER — Telehealth: Payer: Self-pay | Admitting: *Deleted

## 2022-07-01 NOTE — Telephone Encounter (Signed)
  Follow up Call-     06/30/2022   10:06 AM  Call back number  Post procedure Call Back phone  # [phone]  Permission to leave phone message Yes     Patient questions:  Do you have a fever, pain , or abdominal swelling? No. Pain Score  0 *  Have you tolerated food without any problems? Yes.    Have you been able to return to your normal activities? Yes.    Do you have any questions about your discharge instructions: Diet   No. Medications  No. Follow up visit  No.  Do you have questions or concerns about your Care? No.  Actions: * If pain score is 4 or above: No action needed, pain <4.

## 2022-07-06 ENCOUNTER — Other Ambulatory Visit: Payer: Self-pay | Admitting: Family Medicine

## 2022-07-06 DIAGNOSIS — K219 Gastro-esophageal reflux disease without esophagitis: Secondary | ICD-10-CM

## 2022-07-06 MED ORDER — ESOMEPRAZOLE MAGNESIUM 40 MG PO CPDR: 40.0000 mg | DELAYED_RELEASE_CAPSULE | Freq: Every day | ORAL | 3 refills | Status: DC

## 2022-07-10 ENCOUNTER — Other Ambulatory Visit: Payer: Self-pay

## 2022-07-10 MED ORDER — BUDESONIDE 3 MG PO CPEP: 9.0000 mg | ORAL_CAPSULE | Freq: Every day | ORAL | 0 refills | Status: DC

## 2022-07-20 ENCOUNTER — Other Ambulatory Visit: Payer: Self-pay | Admitting: Family Medicine

## 2022-07-20 DIAGNOSIS — E7800 Pure hypercholesterolemia, unspecified: Secondary | ICD-10-CM

## 2022-07-20 DIAGNOSIS — E1165 Type 2 diabetes mellitus with hyperglycemia: Secondary | ICD-10-CM

## 2022-07-20 MED ORDER — ATORVASTATIN CALCIUM 20 MG PO TABS: 20.0000 mg | ORAL_TABLET | Freq: Every day | ORAL | 3 refills | Status: DC

## 2022-08-24 ENCOUNTER — Telehealth: Payer: Self-pay | Admitting: *Deleted

## 2022-08-24 DIAGNOSIS — K219 Gastro-esophageal reflux disease without esophagitis: Secondary | ICD-10-CM

## 2022-08-24 MED ORDER — MISOPROSTOL 200 MCG PO TABS: ORAL_TABLET | ORAL | 2 refills | Status: DC

## 2022-08-24 NOTE — Telephone Encounter (Signed)
Spoke with patient and she needed a refill on her misoprostol.  Rx sent in.

## 2022-08-24 NOTE — Telephone Encounter (Signed)
Who Is Calling Patient / Member / Family / Caregiver Call Type Triage / Clinical Relationship To Patient Self Return Phone Number [phone] (Primary) Chief Complaint Prescription Refill or Medication Request (non symptomatic) Reason for Call Medication Question / Request Initial Comment Caller states she is needing a refill on her medication and needing pharmacy change [facility] [phone] Translation No Disp. Time Eilene Ghazi Time) Disposition Final User 08/22/2022 10:59:09 AM Send To Nurse Nicolasa Ducking, RN, [place] 08/22/2022 11:23:25 AM Attempt made - message left Zenia Resides, RN, [place] 08/22/2022 11:32:51 AM FINAL ATTEMPT MADE - message left Yes Zenia Resides, RN, [place] Final Disposition 08/22/2022 11:32:51 AM FINAL ATTEMPT MADE - message left Yes Zenia Resides, RN, [place]

## 2022-08-28 ENCOUNTER — Other Ambulatory Visit (HOSPITAL_COMMUNITY): Payer: Self-pay

## 2022-08-28 ENCOUNTER — Telehealth: Payer: Self-pay

## 2022-08-28 NOTE — Telephone Encounter (Signed)
PA request received via CMM for Budesonide '3MG'$  dr capsules  PA submitted to [facility] and is submitted and is pending determination  Key: [id number]

## 2022-08-28 NOTE — Telephone Encounter (Signed)
We received fax stating approval of the budesonide '3mg'$  capsules. Good thru 06/22/2023. Pharmacy informed.

## 2022-09-15 ENCOUNTER — Encounter: Payer: Self-pay | Admitting: Family Medicine

## 2022-09-15 ENCOUNTER — Ambulatory Visit (INDEPENDENT_AMBULATORY_CARE_PROVIDER_SITE_OTHER): Payer: Medicare Other | Admitting: Family Medicine

## 2022-09-15 VITALS — BP 120/89 | HR 75 | Temp 98.3°F | Ht 63.0 in | Wt 137.1 lb

## 2022-09-15 DIAGNOSIS — F32A Depression, unspecified: Secondary | ICD-10-CM | POA: Diagnosis not present

## 2022-09-15 DIAGNOSIS — F4310 Post-traumatic stress disorder, unspecified: Secondary | ICD-10-CM

## 2022-09-15 DIAGNOSIS — Z79899 Other long term (current) drug therapy: Secondary | ICD-10-CM

## 2022-09-15 DIAGNOSIS — E119 Type 2 diabetes mellitus without complications: Secondary | ICD-10-CM | POA: Diagnosis not present

## 2022-09-15 DIAGNOSIS — F419 Anxiety disorder, unspecified: Secondary | ICD-10-CM | POA: Diagnosis not present

## 2022-09-15 LAB — COMPREHENSIVE METABOLIC PANEL
ALT: 19 U/L (ref 0–35)
AST: 22 U/L (ref 0–37)
Albumin: 4 g/dL (ref 3.5–5.2)
Alkaline Phosphatase: 51 U/L (ref 39–117)
BUN: 11 mg/dL (ref 6–23)
CO2: 27 mEq/L (ref 19–32)
Calcium: 8.8 mg/dL (ref 8.4–10.5)
Chloride: 101 mEq/L (ref 96–112)
Creatinine, Ser: 0.7 mg/dL (ref 0.40–1.20)
GFR: 85.57 mL/min (ref >60.00)
Glucose, Bld: 94 mg/dL (ref 70–99)
Potassium: 4.8 mEq/L (ref 3.5–5.1)
Sodium: 134 mEq/L — ABNORMAL LOW (ref 135–145)
Total Bilirubin: 0.3 mg/dL (ref 0.2–1.2)
Total Protein: 6.2 g/dL (ref 6.0–8.3)

## 2022-09-15 LAB — LIPID PANEL
Cholesterol: 195 mg/dL (ref 0–200)
HDL: 81.2 mg/dL (ref >39.00)
LDL Cholesterol: 94 mg/dL (ref 0–99)
NonHDL: 114.18
Total CHOL/HDL Ratio: 2
Triglycerides: 103 mg/dL (ref 0.0–149.0)
VLDL: 20.6 mg/dL (ref 0.0–40.0)

## 2022-09-15 LAB — HEMOGLOBIN A1C: Hgb A1c MFr Bld: 6.2 % (ref 4.6–6.5)

## 2022-09-15 MED ORDER — ALPRAZOLAM 0.25 MG PO TABS: 0.2500 mg | ORAL_TABLET | Freq: Two times a day (BID) | ORAL | 5 refills | Status: DC | PRN

## 2022-09-15 NOTE — Patient Instructions (Signed)
Give us 2-3 business days to get the results of your labs back.   Keep the diet clean and stay active.  Let us know if you need anything. 

## 2022-09-15 NOTE — Progress Notes (Signed)
Subjective:   Chief Complaint  Patient presents with   Follow-up    6 month    Alisha Matthews is a 72 y.o. female here for follow-up of diabetes.   [name] does not routinely monitor her sugars.  Patient does not require insulin.   Medications include: metformin XR 500 mg bid Diet is fair.  Exercise: walking No CP or SOB.   GAD/depression Currently taking Paxil 20 mg/d. Reports compliance, no AE's. Uses Xanax 0.25 mg twice daily as needed. No AE's with this. Not following with a counselor. No self medication, no SI/HI.   Past Medical History:  Diagnosis Date   Anemia, unspecified    Anxiety    Arthritis    Asthma    mild exercise induced asthma as a child    Barrett's esophagus    Breast cancer ([place])    right breast invasive ductal carcinoma    Breast fibrocystic disorder    Bronchitis    Chicken pox    Constipation, chronic    Depressive disorder, not elsewhere classified    Diabetes mellitus    borderline   Exercise induced bronchospasm    Genital warts    GERD (gastroesophageal reflux disease)    Hyperlipidemia    Infected postoperative breast seroma    Insomnia, unspecified    Migraine, unspecified, without mention of intractable migraine without mention of status migrainosus    Obesity, unspecified    Osteoarthrosis, unspecified whether generalized or localized, ankle and foot    ankle/foot   Personal history of colonic polyps    Symptomatic menopausal or female climacteric states      Related testing: Retinal exam: Done Pneumovax: not done  Objective:  BP 120/89 (BP Location: Left Arm, Patient Position: Sitting, Cuff Size: Normal)   Pulse 75   Temp 98.3 F (36.8 C) (Oral)   Ht 5\' 3"  (1.6 m)   Wt 137 lb 2 oz (62.2 kg)   LMP 06/22/2000   SpO2 90%   BMI 24.29 kg/m  General:  Well developed, well nourished, in no apparent distress Skin:  Warm, no pallor or diaphoresis Head:  Normocephalic, atraumatic Eyes:  Pupils equal and round, sclera anicteric  without injection  Lungs:  CTAB, no access msc use Cardio:  RRR, no bruits, no LE edema Psych: Age appropriate judgment and insight  Assessment:   Diabetes mellitus without complication ([place]) - Plan: Comprehensive metabolic panel, Lipid panel, Hemoglobin A1c  Anxiety and depression  Encounter for long-term (current) use of high-risk medication - Plan: Drug Monitoring Panel [id number] , Urine  PTSD (post-traumatic stress disorder) - Plan: ALPRAZolam (XANAX) 0.25 MG tablet   Plan:   Chronic, stable.  Continue metformin XR 500 mg twice daily.  Counseled on diet and exercise. Chronic, stable.  Continue Xanax 0.25 mg twice daily as needed and Paxil 20 mg daily.  Controlled substance contract and UDS updated today. F/u in 6 mo. The patient voiced understanding and agreement to the plan.  [place], DO 09/15/22 10:29 AM

## 2022-09-16 ENCOUNTER — Telehealth: Payer: Self-pay | Admitting: Family Medicine

## 2022-09-16 ENCOUNTER — Other Ambulatory Visit: Payer: Self-pay | Admitting: Family Medicine

## 2022-09-16 DIAGNOSIS — F4310 Post-traumatic stress disorder, unspecified: Secondary | ICD-10-CM

## 2022-09-16 MED ORDER — ALPRAZOLAM 0.25 MG PO TABS: 0.2500 mg | ORAL_TABLET | Freq: Two times a day (BID) | ORAL | 5 refills | Status: DC | PRN

## 2022-09-16 NOTE — Telephone Encounter (Signed)
Pt states the pharmacy did not receive the rx sent in for xanax.   [facility] - [address], [address], [place] [address] Phone: [phone]  Fax: [phone]

## 2022-09-16 NOTE — Telephone Encounter (Signed)
Called the patient left message on both cell/home number prescription has been resent.

## 2022-09-17 LAB — DRUG MONITORING PANEL 376104, URINE

## 2022-09-17 LAB — DM TEMPLATE

## 2022-09-23 ENCOUNTER — Other Ambulatory Visit: Payer: Self-pay | Admitting: Family Medicine

## 2022-09-23 DIAGNOSIS — F4310 Post-traumatic stress disorder, unspecified: Secondary | ICD-10-CM

## 2022-09-23 MED ORDER — PAROXETINE HCL 20 MG PO TABS: 20.0000 mg | ORAL_TABLET | Freq: Every day | ORAL | 1 refills | Status: DC

## 2022-10-13 ENCOUNTER — Other Ambulatory Visit: Payer: Self-pay | Admitting: Family Medicine

## 2022-10-28 DIAGNOSIS — M65311 Trigger thumb, right thumb: Secondary | ICD-10-CM | POA: Diagnosis not present

## 2022-11-17 ENCOUNTER — Other Ambulatory Visit: Payer: Self-pay | Admitting: Family Medicine

## 2022-11-17 DIAGNOSIS — E1165 Type 2 diabetes mellitus with hyperglycemia: Secondary | ICD-10-CM

## 2022-11-26 DIAGNOSIS — M65311 Trigger thumb, right thumb: Secondary | ICD-10-CM | POA: Diagnosis not present

## 2022-11-30 ENCOUNTER — Telehealth: Payer: Self-pay | Admitting: Family Medicine

## 2022-11-30 NOTE — Telephone Encounter (Signed)
Copied from CRM [id number]. Topic: Medicare AWV >> Nov 30, 2022 11:45 AM Payton Doughty wrote: Reason for CRM: LM 11/30/2022 to schedule AWV   Verlee Rossetti; Care Guide Ambulatory Clinical Support [place] [facility] Direct Dial: [phone]

## 2023-01-11 ENCOUNTER — Other Ambulatory Visit: Payer: Self-pay | Admitting: Family Medicine

## 2023-01-11 DIAGNOSIS — F4310 Post-traumatic stress disorder, unspecified: Secondary | ICD-10-CM

## 2023-02-04 ENCOUNTER — Encounter (INDEPENDENT_AMBULATORY_CARE_PROVIDER_SITE_OTHER): Payer: Self-pay

## 2023-02-15 ENCOUNTER — Other Ambulatory Visit: Payer: Self-pay | Admitting: Family Medicine

## 2023-02-15 DIAGNOSIS — E1165 Type 2 diabetes mellitus with hyperglycemia: Secondary | ICD-10-CM

## 2023-03-04 DIAGNOSIS — Z961 Presence of intraocular lens: Secondary | ICD-10-CM | POA: Diagnosis not present

## 2023-03-04 DIAGNOSIS — H40013 Open angle with borderline findings, low risk, bilateral: Secondary | ICD-10-CM | POA: Diagnosis not present

## 2023-03-08 DIAGNOSIS — Z23 Encounter for immunization: Secondary | ICD-10-CM | POA: Diagnosis not present

## 2023-03-11 ENCOUNTER — Other Ambulatory Visit: Payer: Self-pay | Admitting: Family Medicine

## 2023-03-11 DIAGNOSIS — F4310 Post-traumatic stress disorder, unspecified: Secondary | ICD-10-CM

## 2023-03-15 ENCOUNTER — Ambulatory Visit: Payer: Medicare Other | Admitting: Family Medicine

## 2023-03-15 DIAGNOSIS — Z23 Encounter for immunization: Secondary | ICD-10-CM | POA: Diagnosis not present

## 2023-03-19 ENCOUNTER — Other Ambulatory Visit: Payer: Self-pay | Admitting: Family Medicine

## 2023-03-19 DIAGNOSIS — F4310 Post-traumatic stress disorder, unspecified: Secondary | ICD-10-CM

## 2023-03-19 NOTE — Telephone Encounter (Signed)
Last OV--3/26/.24 Last RF---09/16/22--#60 with 5 refills

## 2023-04-06 ENCOUNTER — Encounter: Payer: Self-pay | Admitting: Family Medicine

## 2023-04-06 ENCOUNTER — Ambulatory Visit (INDEPENDENT_AMBULATORY_CARE_PROVIDER_SITE_OTHER): Payer: Medicare Other | Admitting: Family Medicine

## 2023-04-06 VITALS — BP 120/82 | HR 65 | Temp 98.3°F | Ht 63.0 in | Wt 135.2 lb

## 2023-04-06 DIAGNOSIS — F419 Anxiety disorder, unspecified: Secondary | ICD-10-CM

## 2023-04-06 DIAGNOSIS — E1165 Type 2 diabetes mellitus with hyperglycemia: Secondary | ICD-10-CM

## 2023-04-06 DIAGNOSIS — Z7984 Long term (current) use of oral hypoglycemic drugs: Secondary | ICD-10-CM | POA: Diagnosis not present

## 2023-04-06 DIAGNOSIS — F32A Depression, unspecified: Secondary | ICD-10-CM | POA: Diagnosis not present

## 2023-04-06 DIAGNOSIS — E7800 Pure hypercholesterolemia, unspecified: Secondary | ICD-10-CM

## 2023-04-06 LAB — COMPREHENSIVE METABOLIC PANEL
ALT: 19 U/L (ref 0–35)
AST: 25 U/L (ref 0–37)
Albumin: 3.9 g/dL (ref 3.5–5.2)
Alkaline Phosphatase: 47 U/L (ref 39–117)
BUN: 8 mg/dL (ref 6–23)
CO2: 27 meq/L (ref 19–32)
Calcium: 9 mg/dL (ref 8.4–10.5)
Chloride: 102 meq/L (ref 96–112)
Creatinine, Ser: 0.78 mg/dL (ref 0.40–1.20)
GFR: 74.86 mL/min (ref >60.00)
Glucose, Bld: 93 mg/dL (ref 70–99)
Potassium: 4.5 meq/L (ref 3.5–5.1)
Sodium: 136 meq/L (ref 135–145)
Total Bilirubin: 0.4 mg/dL (ref 0.2–1.2)
Total Protein: 6.1 g/dL (ref 6.0–8.3)

## 2023-04-06 LAB — LIPID PANEL
Cholesterol: 198 mg/dL (ref 0–200)
HDL: 80.1 mg/dL (ref >39.00)
LDL Cholesterol: 102 mg/dL — ABNORMAL HIGH (ref 0–99)
NonHDL: 117.72
Total CHOL/HDL Ratio: 2
Triglycerides: 81 mg/dL (ref 0.0–149.0)
VLDL: 16.2 mg/dL (ref 0.0–40.0)

## 2023-04-06 LAB — MICROALBUMIN / CREATININE URINE RATIO
Creatinine,U: 71.9 mg/dL
Microalb Creat Ratio: 1 mg/g (ref 0.0–30.0)
Microalb, Ur: <0.7 mg/dL (ref 0.0–1.9)

## 2023-04-06 LAB — HEMOGLOBIN A1C: Hgb A1c MFr Bld: 6.2 % (ref 4.6–6.5)

## 2023-04-06 NOTE — Progress Notes (Signed)
Subjective:   Chief Complaint  Patient presents with   Follow-up    Alisha Matthews is a 72 y.o. female here for follow-up of diabetes.   Alisha Matthews does not routinely ck her sugars.  Patient does not require insulin.   Medications include: metformin XR 500 mg bid Diet is fair.  Exercise: walking, stair stepper, some strength training  Hyperlipidemia Patient presents for hyperlipidemia follow up. Currently being treated with Lipitor 20 mg/d and compliance with treatment thus far has been good. She denies myalgias. Diet/exercise as above. No CP or SOB.  The patient is not known to have coexisting coronary artery disease.  Anxiety/depression Taking Paxil 20 mg/d. Reports compliance, no AE's. Mood stable overall. Taking Xanax 0.25 mg bid prn. No AE's. Not following with therapist/counselor. No SI or HI. No self medication.   Past Medical History:  Diagnosis Date   Anemia, unspecified    Anxiety    Arthritis    Asthma    mild exercise induced asthma as a child    Barrett's esophagus    Breast cancer ([facility])    right breast invasive ductal carcinoma    Breast fibrocystic disorder    Bronchitis    Chicken pox    Constipation, chronic    Depressive disorder, not elsewhere classified    Diabetes mellitus    borderline   Exercise induced bronchospasm    Genital warts    GERD (gastroesophageal reflux disease)    Hyperlipidemia    Infected postoperative breast seroma    Insomnia, unspecified    Migraine, unspecified, without mention of intractable migraine without mention of status migrainosus    Obesity, unspecified    Osteoarthrosis, unspecified whether generalized or localized, ankle and foot    ankle/foot   Personal history of colonic polyps    Symptomatic menopausal or female climacteric states      Related testing: Retinal exam: Done Pneumovax: done  Objective:  BP 120/82 (BP Location: Left Arm, Patient Position: Sitting, Cuff Size: Normal)   Pulse 65   Temp 98.3 F  (36.8 C) (Oral)   Ht 5\' 3"  (1.6 m)   Wt 135 lb 4 oz (61.3 kg)   LMP 06/22/2000   SpO2 99%   BMI 23.96 kg/m  General:  Well developed, well nourished, in no apparent distress Skin:  Warm, no pallor or diaphoresis Head:  Normocephalic, atraumatic Eyes:  Pupils equal and round, sclera anicteric without injection  Lungs:  CTAB, no access msc use Cardio:  RRR, no bruits, no LE edema Musculoskeletal:  Symmetrical muscle groups noted without atrophy or deformity Neuro:  Sensation intact to pinprick on feet Psych: Age appropriate judgment and insight  Assessment:   Type 2 diabetes mellitus with hyperglycemia, without long-term current use of insulin ([facility]) - Plan: Comprehensive metabolic panel, Lipid panel, Hemoglobin A1c, Microalbumin / creatinine urine ratio  Pure hypercholesterolemia  Anxiety and depression   Plan:   Chronic, stable. Cont metformin XR 500 mg bid. Counseled on diet and exercise. Chronic, stable. Cont Lipitor 20 mg/d.  Chronic, stable. Cont Paxil 20 mg/d, trazodone qhs prn, Xanax 0.25 mg bid prn.  F/u in 6 mo. The patient voiced understanding and agreement to the plan.  Jilda Roche [place], DO 04/06/23 10:24 AM

## 2023-04-06 NOTE — Patient Instructions (Signed)
Give [place] 2-3 business days to get the results of your labs back.   Keep the diet clean and stay active.  Let us know if you need anything.

## 2023-04-15 IMAGING — CT CT SHOULDER*L* W/O CM
1 of 2 series · 9 of 14 positions shown, 12 images · non-contrast
Comparison: Chest CT 09/20/2020.

CLINICAL DATA: Left shoulder pain

EXAM:
CT OF THE UPPER LEFT EXTREMITY WITHOUT CONTRAST
TECHNIQUE: Multidetector CT imaging of the upper left extremity was performed
according to the standard protocol.
RADIATION DOSE REDUCTION: This exam was performed according to the
departmental dose-optimization program which includes automated
exposure control, adjustment of the mA and/or kV according to
patient size and/or use of iterative reconstruction technique.

[Series 14: shoulder 0.60 br40 s3 thins soft · axial · 0.52mm/px · z∈[-913,-777]mm · 9 of 285 slices shown, 12 images]
[im 29/285  soft-tissue]
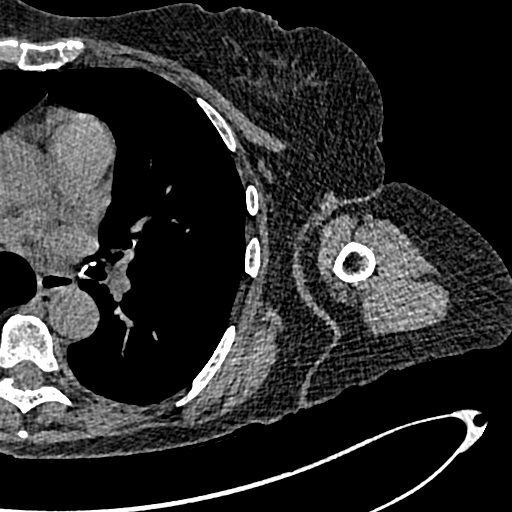
[im 29/285  bone]
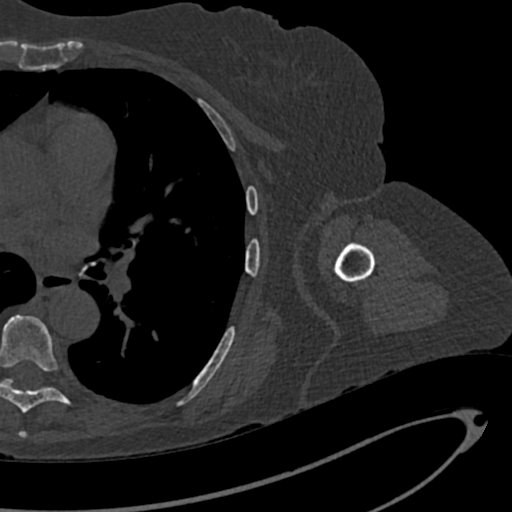
[im 57/285  bone]
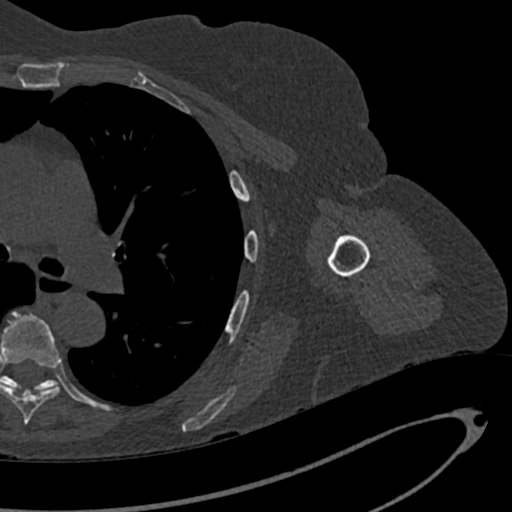
[im 86/285  bone]
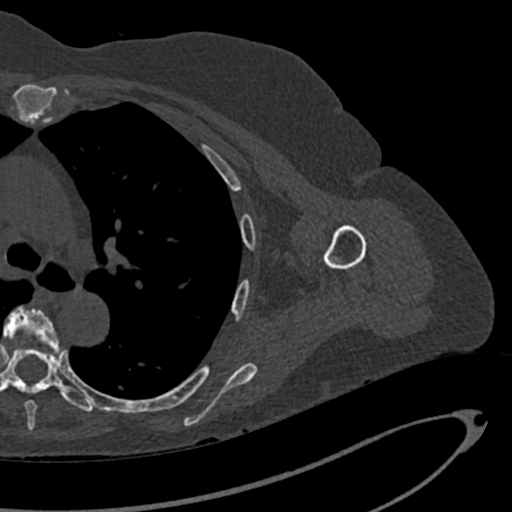
[im 114/285  bone]
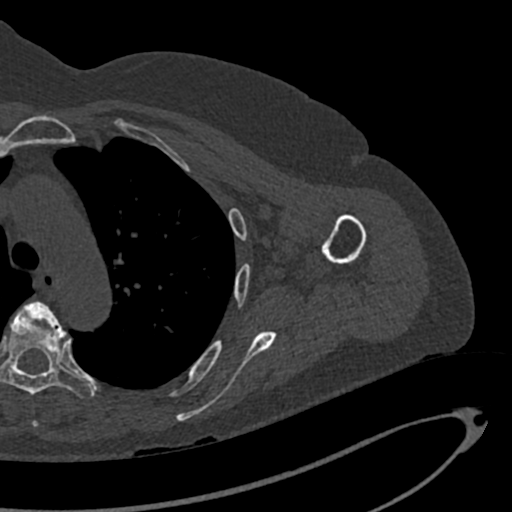
[im 143/285  soft-tissue]
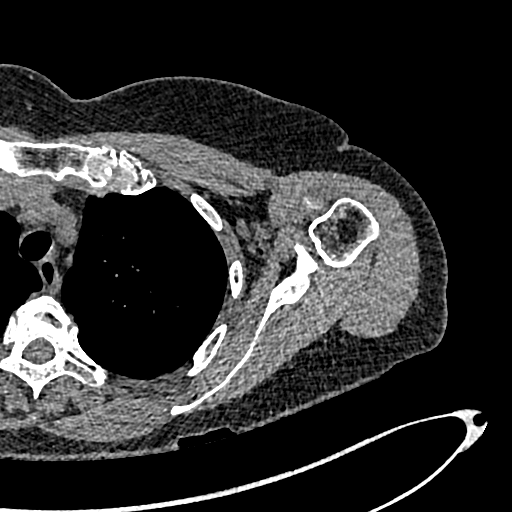
[im 143/285  bone]
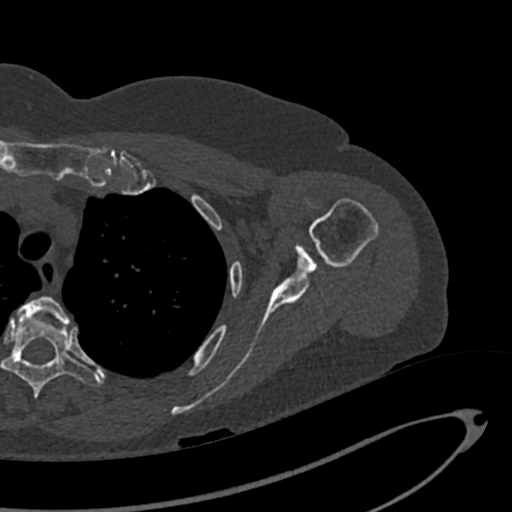
[im 171/285  bone]
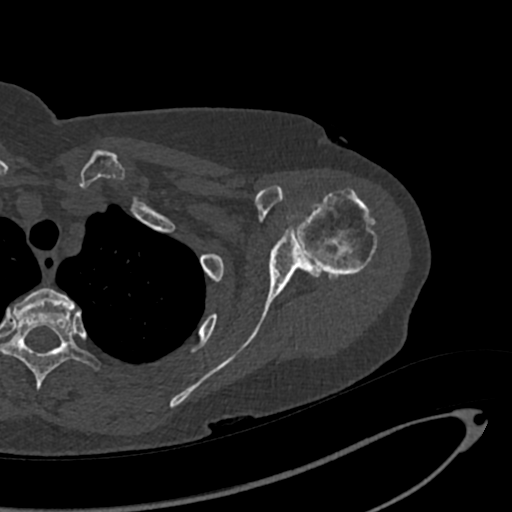
[im 199/285  bone]
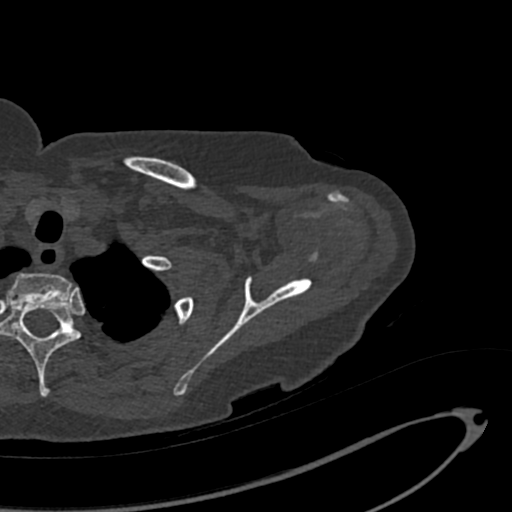
[im 228/285  bone]
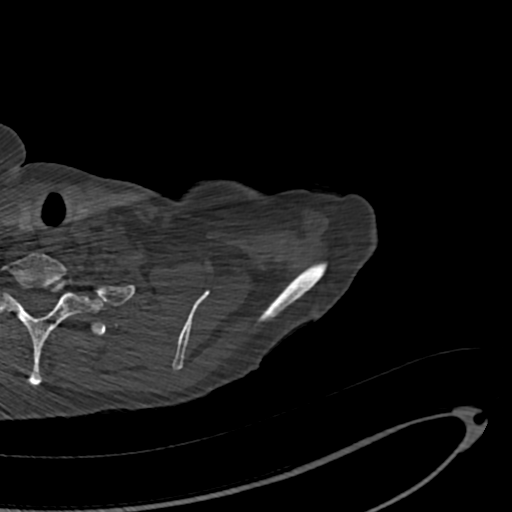
[im 256/285  soft-tissue]
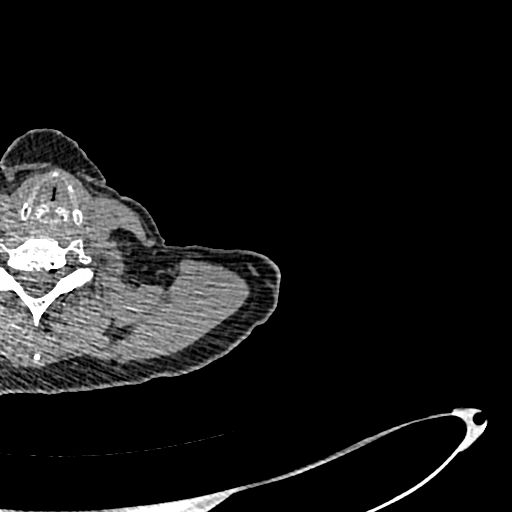
[im 256/285  bone]
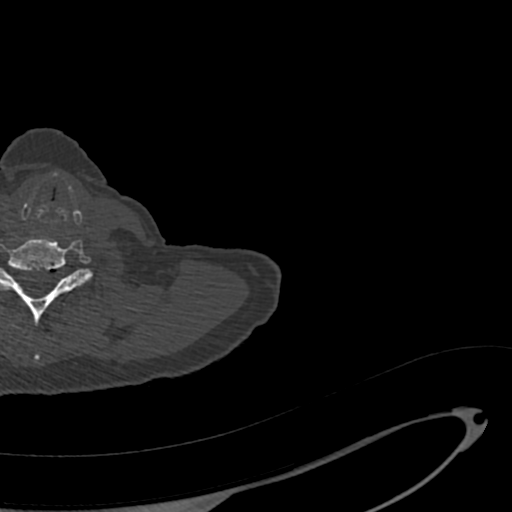

[9 of 14 positions shown; findings below may reference images not displayed]

FINDINGS: Bones/Joint/Cartilage

There is glenohumeral osteophyte formation with severe joint space
narrowing, bone on bone articulation, subchondral sclerosis, and
cystic change. There is an ossified joint body adjacent to the
posterosuperior glenoid measuring 1.2 x 1.3 cm (series 4, image 28).
There is a small joint effusion with mineralization. Likely adequate
glenoid bone stock with slight retroversion. There is mild AC joint
arthropathy. Chronic left anterior rib injuries.

Ligaments

Suboptimally assessed by CT.

Muscles and Tendons

No significant muscle atrophy.

Soft tissues

No focal fluid collection. Unchanged branching tubular opacity in
the superior segment of the left lower lobe, likely a bronchocele.
IMPRESSION: Severe glenohumeral osteoarthritis. No significant muscle atrophy.
Likely adequate glenoid bone stock with slight retroversion.
Osteochondral joint body adjacent to the posterosuperior glenoid
measuring 1.2 x 1.3 cm.

## 2023-04-19 IMAGING — DX DG CHEST 2V
2 series · 2 of 2 positions shown · non-contrast
Comparison: 06/17/2020

CLINICAL DATA: 70-year-old female with a history of preoperative
chest x-ray

EXAM:
CHEST - 2 VIEW

[chest pa]
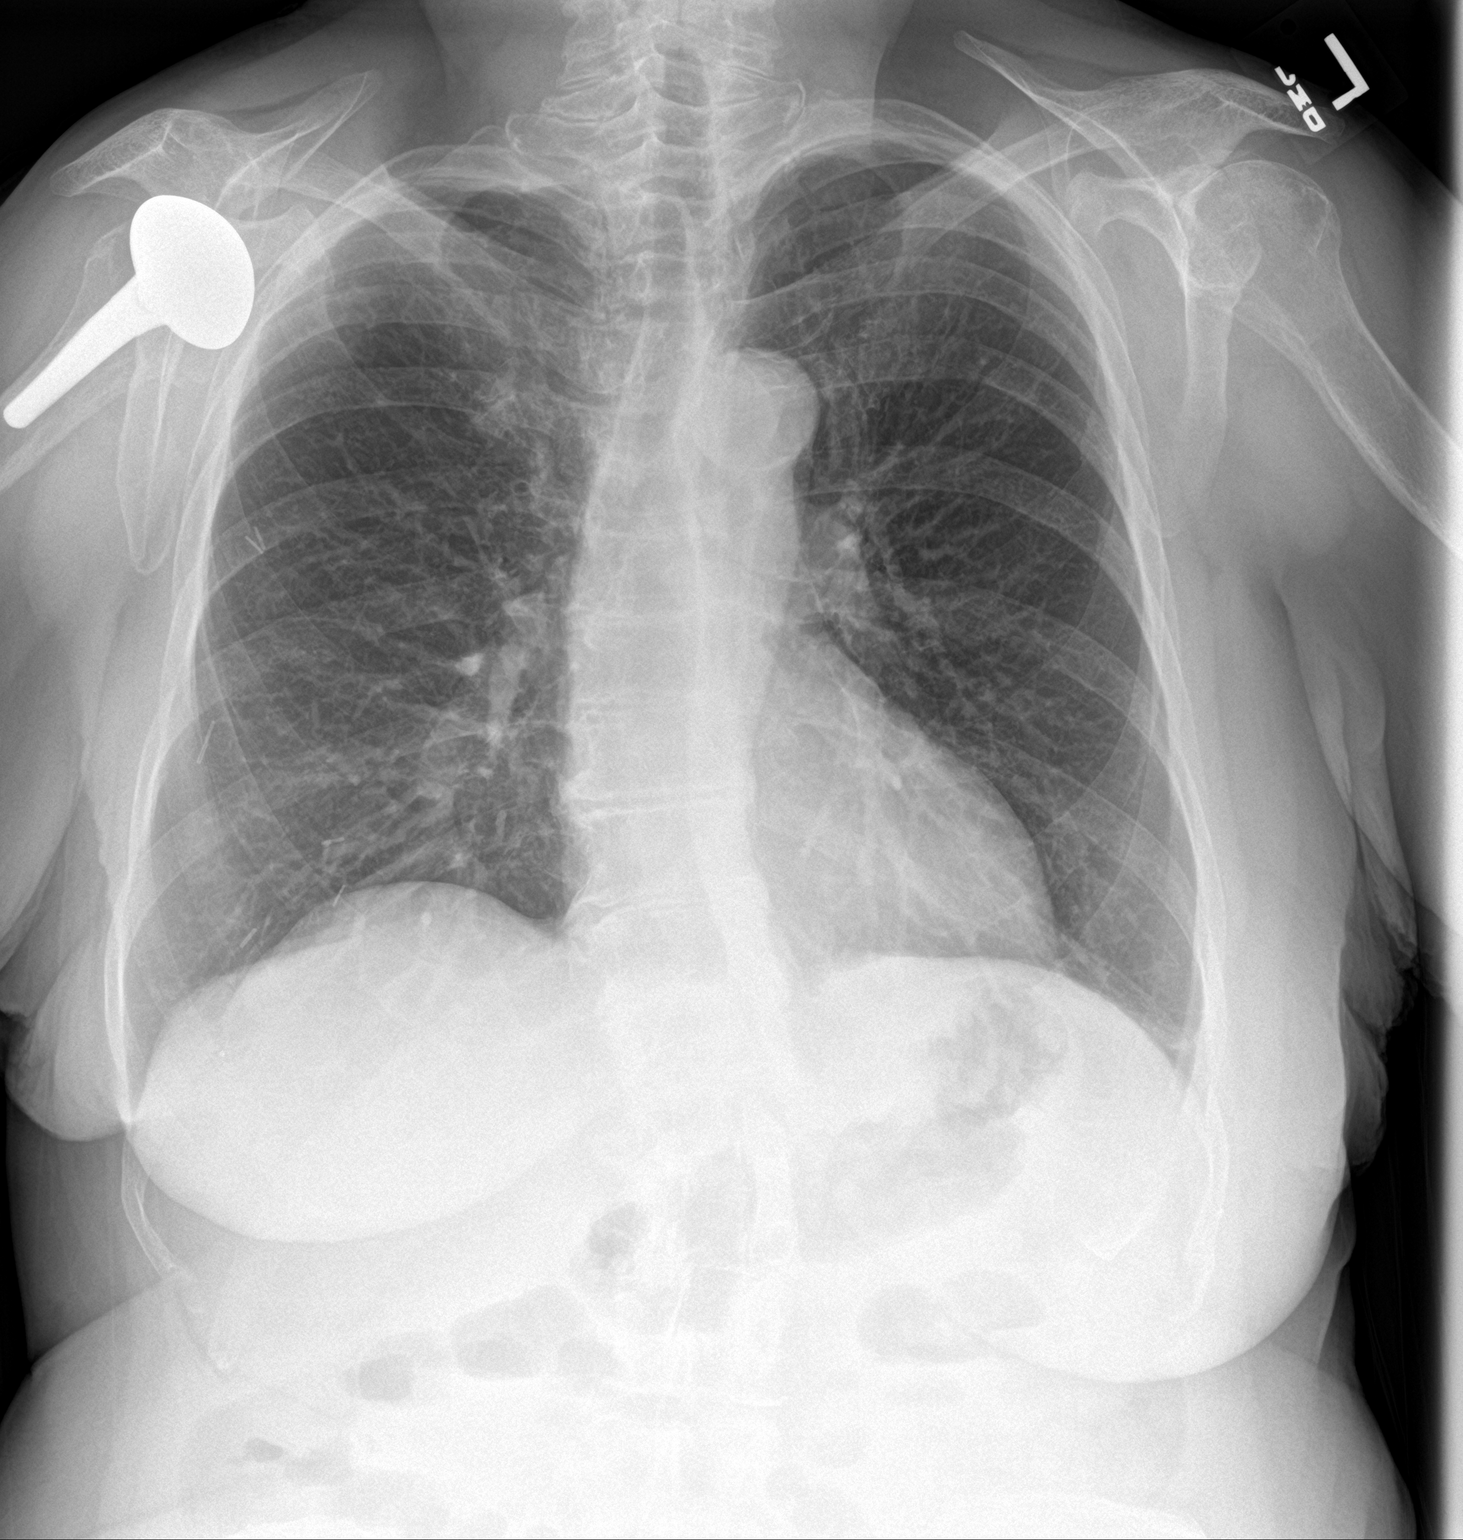

[chest lat]
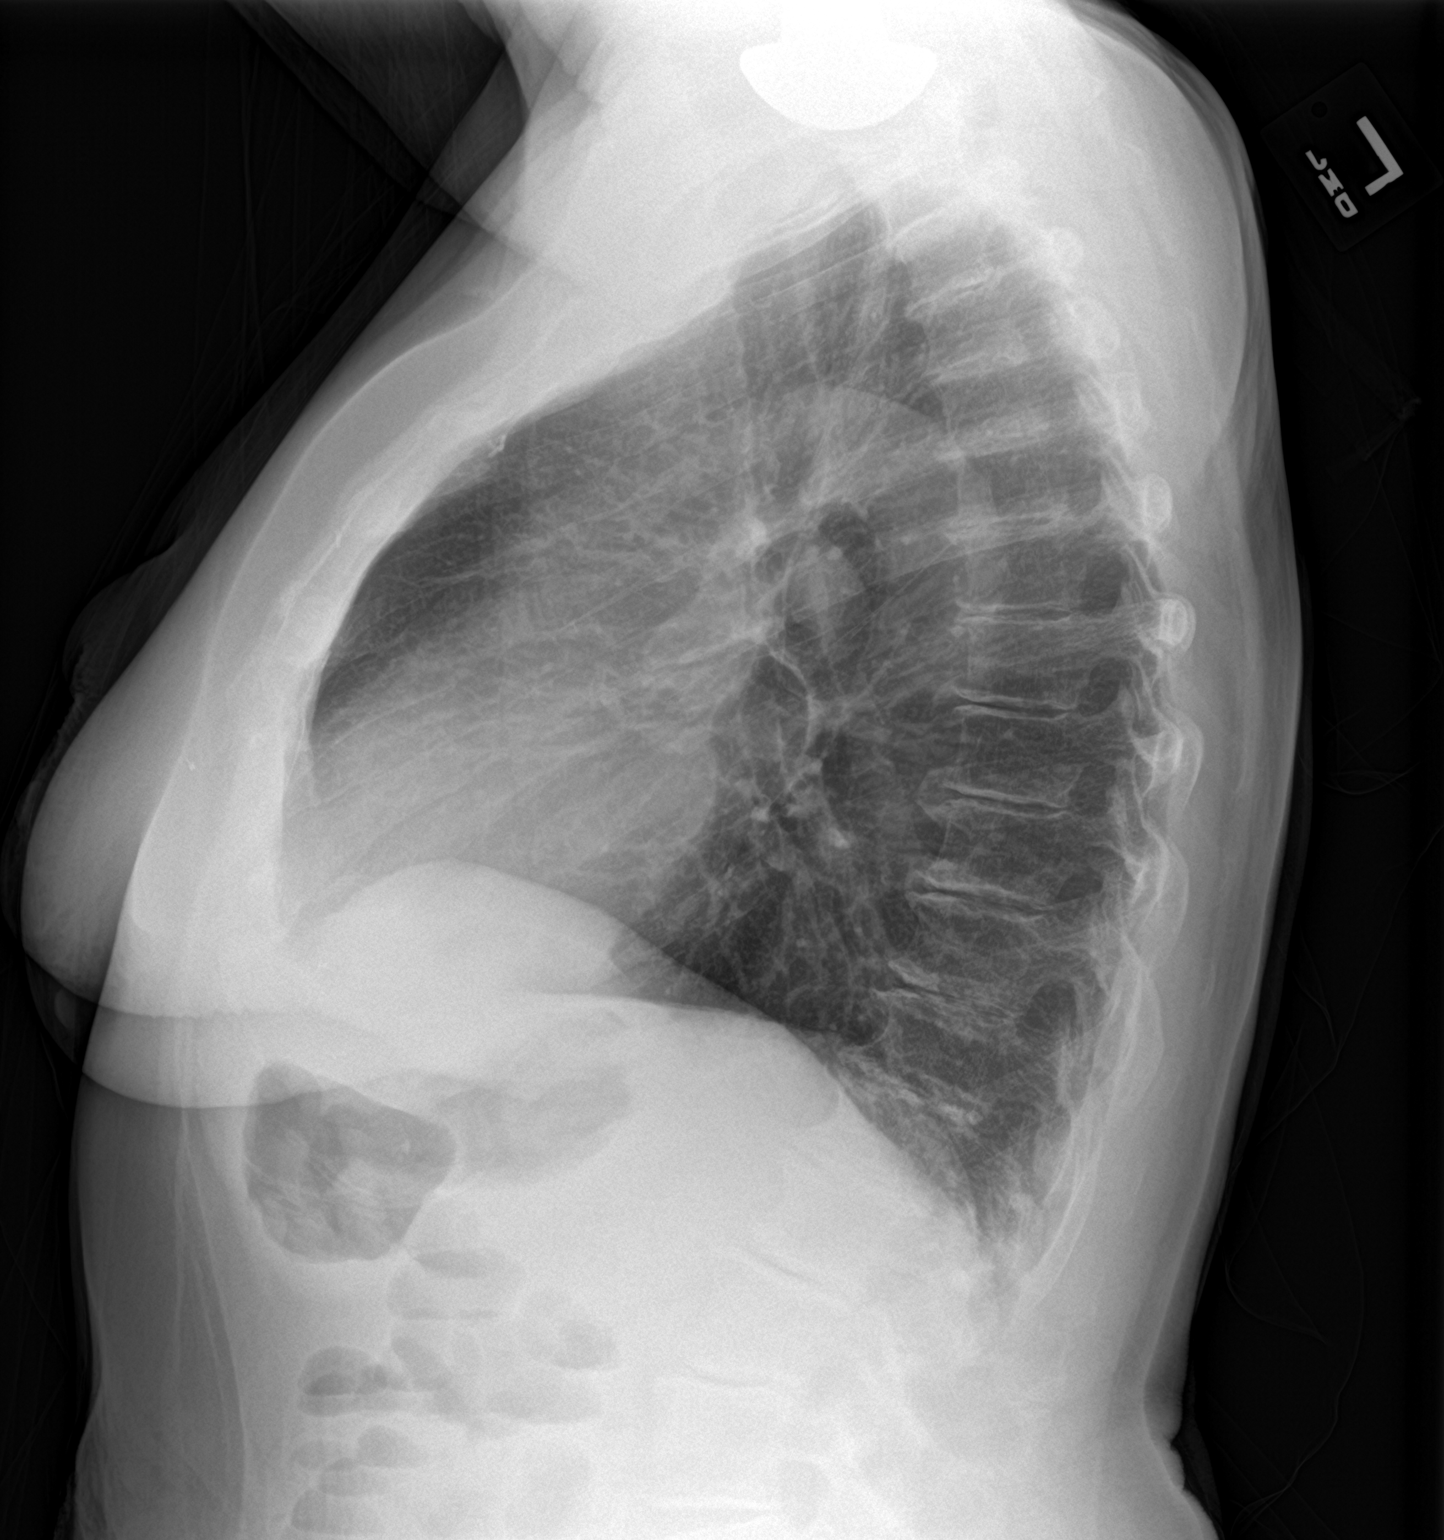

[2 of 2 positions shown; findings below may reference images not displayed]

FINDINGS: Cardiomediastinal silhouette unchanged in size and contour. No
evidence of central vascular congestion. No interlobular septal
thickening.

No pneumothorax or pleural effusion. Coarsened interstitial
markings, with no confluent airspace disease.

No acute displaced fracture. Degenerative changes of the spine.

Interval surgical changes of right shoulder arthroplasty.

Redemonstration of surgical changes of the right chest wall
IMPRESSION: No active cardiopulmonary disease.

## 2023-05-21 ENCOUNTER — Other Ambulatory Visit: Payer: Self-pay | Admitting: Family Medicine

## 2023-05-21 DIAGNOSIS — K219 Gastro-esophageal reflux disease without esophagitis: Secondary | ICD-10-CM

## 2023-06-08 DIAGNOSIS — R1110 Vomiting, unspecified: Secondary | ICD-10-CM | POA: Diagnosis not present

## 2023-06-08 DIAGNOSIS — K591 Functional diarrhea: Secondary | ICD-10-CM | POA: Diagnosis not present

## 2023-06-13 ENCOUNTER — Other Ambulatory Visit: Payer: Self-pay

## 2023-06-13 ENCOUNTER — Emergency Department (HOSPITAL_BASED_OUTPATIENT_CLINIC_OR_DEPARTMENT_OTHER)
Admission: EM | Admit: 2023-06-13 | Discharge: 2023-06-13 | Disposition: A | Discharge status: Home / Self Care | Payer: Medicare Other | Attending: Emergency Medicine | Admitting: Emergency Medicine

## 2023-06-13 ENCOUNTER — Encounter (HOSPITAL_BASED_OUTPATIENT_CLINIC_OR_DEPARTMENT_OTHER): Payer: Self-pay

## 2023-06-13 DIAGNOSIS — S91109A Unspecified open wound of unspecified toe(s) without damage to nail, initial encounter: Secondary | ICD-10-CM

## 2023-06-13 DIAGNOSIS — S99922A Unspecified injury of left foot, initial encounter: Secondary | ICD-10-CM | POA: Diagnosis present

## 2023-06-13 DIAGNOSIS — E119 Type 2 diabetes mellitus without complications: Secondary | ICD-10-CM | POA: Insufficient documentation

## 2023-06-13 DIAGNOSIS — S91115A Laceration without foreign body of left lesser toe(s) without damage to nail, initial encounter: Secondary | ICD-10-CM | POA: Insufficient documentation

## 2023-06-13 DIAGNOSIS — Z79899 Other long term (current) drug therapy: Secondary | ICD-10-CM | POA: Diagnosis not present

## 2023-06-13 DIAGNOSIS — Z23 Encounter for immunization: Secondary | ICD-10-CM | POA: Insufficient documentation

## 2023-06-13 DIAGNOSIS — W268XXA Contact with other sharp object(s), not elsewhere classified, initial encounter: Secondary | ICD-10-CM | POA: Insufficient documentation

## 2023-06-13 DIAGNOSIS — Z7984 Long term (current) use of oral hypoglycemic drugs: Secondary | ICD-10-CM | POA: Insufficient documentation

## 2023-06-13 DIAGNOSIS — S91205A Unspecified open wound of left lesser toe(s) with damage to nail, initial encounter: Secondary | ICD-10-CM | POA: Insufficient documentation

## 2023-06-13 DIAGNOSIS — Z853 Personal history of malignant neoplasm of breast: Secondary | ICD-10-CM | POA: Diagnosis not present

## 2023-06-13 MED ORDER — TETANUS-DIPHTH-ACELL PERTUSSIS 5-2.5-18.5 LF-MCG/0.5 IM SUSY
0.5000 mL | PREFILLED_SYRINGE | Freq: Once | INTRAMUSCULAR | Status: AC
  Administered 2023-06-13: 0.5 mL via INTRAMUSCULAR
  Filled 2023-06-13: qty 0.5

## 2023-06-13 NOTE — Discharge Instructions (Signed)
As discussed, skin has been repaired using tissue adhesive or Dermabond.  Just avoid getting area excessively wet or submerging your foot in water.  Pat dry if he gets wet.  Please do not hesitate to return if signs concerning for infection develop.  Otherwise, area should heal well.

## 2023-06-13 NOTE — ED Provider Notes (Signed)
[place] [facility] Provider Note   CSN: [id number] Arrival date & time: 06/13/23  1227     History  Chief Complaint  Patient presents with   Toe Injury    Alisha Matthews is a 72 y.o. female.  HPI   72 year old female presents emergency department with complaints of cut on left third toe.  Patient was attempting to remove a callus yesterday with scissors and excellently "went too deep" and cut her toe.  Reports bleeding from area last night which persisted until the morning when she took off her Band-Aid prompting visit to the emergency department.  She is her last Tdap was 2019 but requesting update given rusty scissors.  Denies any trauma elsewhere.  Past medical history significant for diabetes mellitus, GERD, breast cancer, hyperlipidemia, migraine, PTSD  Home Medications Prior to Admission medications   Medication Sig Start Date End Date Taking? Authorizing Provider  ALPRAZolam Prudy Feeler) 0.25 MG tablet TAKE ONE TABLET BY MOUTH TWICE DAILY AS NEEDED FOR ANXIETY 03/19/23   Wendling, Jilda Roche, DO  atorvastatin (LIPITOR) 20 MG tablet Take 1 tablet (20 mg total) by mouth daily. 07/20/22   Sharlene Dory, DO  Blood Glucose Monitoring Suppl (ACCU-CHEK GUIDE ME) w/Device KIT USE DAILY TO CHECK BLOOD SUGAR. DX E11.9 03/17/21   Sharlene Dory, DO  budesonide (ENTOCORT EC) 3 MG 24 hr capsule Take 3 capsules (9 mg total) by mouth daily. 07/10/22   Meryl Dare, MD  Calcium-Magnesium-Vitamin D (CALCIUM 500 PO) Take 500 mg by mouth 3 (three) times daily.    [provider]  clindamycin (CLEOCIN) 150 MG capsule Take 600 mg by mouth See admin instructions. Take 600 mg by mouth prior to dental procedures    [provider]  Coenzyme Q10 (COQ10) 200 MG CAPS Take 200 mg by mouth daily.    [provider]  diclofenac (VOLTAREN) 75 MG EC tablet TAKE 1 TABLET BY MOUTH 2 TIMES DAILY 10/13/22   Carmelia Roller, Jilda Roche,  DO  esomeprazole (NEXIUM) 40 MG capsule Take 1 capsule (40 mg total) by mouth daily. 07/06/22   Sharlene Dory, DO  frovatriptan (FROVA) 2.5 MG tablet Take 1 tablet (2.5 mg total) by mouth as needed for migraine (max 3 tabs in 24 hours). If recurs, may repeat after 2 hours. Max of 3 tabs in 24 hours. 03/02/17   Ethelda Chick, MD  LevOCARNitine (CARNITINE PO) Take 3,000 mg by mouth daily.     [provider]  metFORMIN (GLUCOPHAGE-XR) 500 MG 24 hr tablet TAKE ONE TABLET BY MOUTH TWO TIMES DAILY 02/15/23   Sharlene Dory, DO  misoprostol (CYTOTEC) 200 MCG tablet TAKE 1 TABLET BY MOUTH 2 TIMES DAILY. 05/22/23   Sharlene Dory, DO  Multiple Vitamins-Minerals (ANTIOXIDANT PO) Take 1 capsule by mouth daily. Fruit    [provider]  OVER THE COUNTER MEDICATION Take 1 capsule by mouth daily. Procaps package: Elite-100 omega 3 vitamin    [provider]  OVER THE COUNTER MEDICATION Take 1 capsule by mouth daily. Procaps: Memory,brain and liver vitamin    [provider]  OVER THE COUNTER MEDICATION Take 1 capsule by mouth daily. Circulation and vein support vitamin    [provider]  PARoxetine (PAXIL) 20 MG tablet Take 1 tablet (20 mg total) by mouth daily. 03/11/23   Sharlene Dory, DO  Probiotic Product (PROBIOTIC DAILY PO) Take 1 capsule by mouth daily.    [provider]  traZODone (DESYREL) 50 MG tablet TAKE 1/2 TO 1 TABLET BY MOUTH AT BEDTIME AS NEEDED FOR SLEEP 01/11/23   Sharlene Dory, DO      Allergies    Penicillins    Review of Systems   Review of Systems  All other systems reviewed and are negative.   Physical Exam Updated Vital Signs BP 133/78 (BP Location: Right Arm)   Pulse 66   Temp 98.6 F (37 C) (Oral)   Resp 18   Ht 5\' 3"  (1.6 m)   Wt 58.5 kg   LMP 06/22/2000   SpO2 100%   BMI 22.85 kg/m  Physical Exam Vitals and nursing note reviewed.  Constitutional:      General: She  is not in acute distress.    Appearance: She is well-developed.  HENT:     Head: Normocephalic and atraumatic.  Eyes:     Conjunctiva/sclera: Conjunctivae normal.  Cardiovascular:     Rate and Rhythm: Normal rate and regular rhythm.  Pulmonary:     Effort: Pulmonary effort is normal. No respiratory distress.     Breath sounds: Normal breath sounds.  Abdominal:     Palpations: Abdomen is soft.     Tenderness: There is no abdominal tenderness.  Musculoskeletal:        General: No swelling.     Cervical back: Neck supple.     Comments: Full range of motion of left third digit.  Skin avulsion appreciated at the tip of said digit.  No active bleeding upon exam.  Skin:    General: Skin is warm and dry.     Capillary Refill: Capillary refill takes less than 2 seconds.  Neurological:     Mental Status: She is alert.  Psychiatric:        Mood and Affect: Mood normal.     ED Results / Procedures / Treatments   Labs (all labs ordered are listed, but only abnormal results are displayed) Labs Reviewed - No data to display  EKG None  Radiology No results found.  Procedures .Laceration Repair  Date/Time: 06/13/2023 1:28 PM  Performed by: Peter Garter, PA Authorized by: Peter Garter, PA   Consent:    Consent obtained:  Verbal   Consent given by:  Patient   Risks, benefits, and alternatives were discussed: yes     Risks discussed:  Need for additional repair, nerve damage, infection and poor cosmetic result   Alternatives discussed:  No treatment and delayed treatment Universal protocol:    Procedure explained and questions answered to patient or proxy's satisfaction: yes     Patient identity confirmed:  Verbally with patient and arm band Anesthesia:    Anesthesia method:  None Laceration details:    Location:  Toe   Toe location:  L third toe   Length (cm):  1 (Skin avulsion of distal tip.  1 point centimeters in diameter.) Exploration:    Limited defect  created (wound extended): no     Hemostasis achieved with:  Direct pressure   Imaging outcome: foreign body not noted     Wound exploration: wound explored through full range of motion and entire depth of wound visualized     Contaminated: no   Treatment:    Area cleansed with:  Saline and chlorhexidine   Amount of cleaning:  Standard   Irrigation solution:  Sterile water   Irrigation volume:  75cc   Irrigation method:  Syringe   Visualized foreign bodies/material removed: no  Debridement:  None   Undermining:  None   Scar revision: no   Skin repair:    Repair method:  Tissue adhesive Approximation:    Approximation:  Close Repair type:    Repair type:  Simple Post-procedure details:    Dressing:  Bulky dressing   Procedure completion:  Tolerated well, no immediate complications     Medications Ordered in ED Medications  Tdap (BOOSTRIX) injection 0.5 mL (0.5 mLs Intramuscular Given 06/13/23 1303)    ED Course/ Medical Decision Making/ A&P                                 Medical Decision Making Risk Prescription drug management.   This patient presents to the ED for concern of cut, this involves an extensive number of treatment options, and is a complaint that carries with it a high risk of complications and morbidity.  The differential diagnosis includes laceration, skin avulsion, abrasion, fracture, dislocation, ligamentous/tendinous injury, neurovascular compromise, other   Co morbidities that complicate the patient evaluation  See HPI   Additional history obtained:  Additional history obtained from EMR External records from outside source obtained and reviewed including hospital records   Lab Tests:  N/a   Imaging Studies ordered:  N/a   Cardiac Monitoring: / EKG:  The patient was maintained on a cardiac monitor.  I personally viewed and interpreted the cardiac monitored which showed an underlying rhythm of: sinus rhythm   Consultations  Obtained:  N/a   Problem List / ED Course / Critical interventions / Medication management  Toe injury I ordered medication including Tdap, Dermabond  Reevaluation of the patient after these medicines showed that the patient improved I have reviewed the patients home medicines and have made adjustments as needed   Social Determinants of Health:  Denies tobacco, licit drug use.   Test / Admission - Considered:  Skin avulsion of toe Vitals signs within normal range and stable throughout visit. 72 year old female presents emergency department with complaints of a cut on her left third toe.  Is attempting to cut off a callus when she "went too deep."  On exam, small skin avulsion distal aspect of left third toe with no active bleeding.  No obvious bony tenderness.  X-ray deemed unnecessary clinically.  Area cleaned with layer of Dermabond applied to distal tip of affected digit.  Proper wound care with skin adhesive discussed as described in AVS.  Routine follow-up with PCP recommended for reevaluation.  Treatment plan discussed length with patient and she acknowledged understanding was agreeable to said plan.  Patient overall well-appearing, afebrile in no acute distress. Worrisome signs and symptoms were discussed with the patient, and the patient acknowledged understanding to return to the ED if noticed. Patient was stable upon discharge.          Final Clinical Impression(s) / ED Diagnoses Final diagnoses:  Avulsion of skin of toe, initial encounter    Rx / DC Orders ED Discharge Orders     None         Peter Garter, [place] 06/13/23 1332    Terald Sleeper, MD 06/13/23 1354

## 2023-06-13 NOTE — ED Notes (Addendum)
Dermabond applied by EDP at bedside.

## 2023-06-13 NOTE — ED Triage Notes (Signed)
Was doing a self pedicure and cut the 3rd toe. States was bleeding and slowed, however still oozing. Bandage in place upon arrival.

## 2023-06-14 ENCOUNTER — Telehealth: Payer: Self-pay | Admitting: *Deleted

## 2023-06-14 NOTE — Transitions of Care (Post Inpatient/ED Visit) (Signed)
   06/14/2023  Name: Alisha Matthews MRN: [id number] DOB: 11/07/1950  Today's TOC FU Call Status: Today's TOC FU Call Status:: Unsuccessful Call (1st Attempt) Unsuccessful Call (1st Attempt) Date: 06/14/23  Attempted to reach the patient regarding the most recent Inpatient/ED visit.  Follow Up Plan: Additional outreach attempts will be made to reach the patient to complete the Transitions of Care (Post Inpatient/ED visit) call.   Signature Rachid Parham, [facility]

## 2023-06-28 ENCOUNTER — Other Ambulatory Visit: Payer: Self-pay | Admitting: Family Medicine

## 2023-06-28 DIAGNOSIS — E1165 Type 2 diabetes mellitus with hyperglycemia: Secondary | ICD-10-CM

## 2023-07-01 DIAGNOSIS — Z1231 Encounter for screening mammogram for malignant neoplasm of breast: Secondary | ICD-10-CM | POA: Diagnosis not present

## 2023-07-01 LAB — HM MAMMOGRAPHY

## 2023-07-02 ENCOUNTER — Encounter: Payer: Self-pay | Admitting: Family Medicine

## 2023-07-26 DIAGNOSIS — M25512 Pain in left shoulder: Secondary | ICD-10-CM | POA: Diagnosis not present

## 2023-07-26 DIAGNOSIS — M25511 Pain in right shoulder: Secondary | ICD-10-CM | POA: Diagnosis not present

## 2023-07-28 DIAGNOSIS — M25552 Pain in left hip: Secondary | ICD-10-CM | POA: Diagnosis not present

## 2023-07-28 DIAGNOSIS — M25562 Pain in left knee: Secondary | ICD-10-CM | POA: Diagnosis not present

## 2023-08-03 DIAGNOSIS — Z961 Presence of intraocular lens: Secondary | ICD-10-CM | POA: Diagnosis not present

## 2023-08-03 DIAGNOSIS — H40013 Open angle with borderline findings, low risk, bilateral: Secondary | ICD-10-CM | POA: Diagnosis not present

## 2023-08-03 LAB — HM DIABETES EYE EXAM

## 2023-08-16 ENCOUNTER — Other Ambulatory Visit: Payer: Self-pay | Admitting: Family Medicine

## 2023-08-16 DIAGNOSIS — F4310 Post-traumatic stress disorder, unspecified: Secondary | ICD-10-CM

## 2023-08-16 NOTE — Telephone Encounter (Signed)
 Copied from CRM [id number]. Topic: Clinical - Medication Refill >> Aug 16, 2023 10:45 AM Benetta Spar A wrote: Most Recent Primary Care Visit:  Provider: Sharlene Dory  Department: [facility]  Visit Type: OFFICE VISIT  Date: 04/06/2023  Medication: PARoxetine (PAXIL) 20 MG tablet  Has the patient contacted their pharmacy? No (Agent: If no, request that the patient contact the pharmacy for the refill. If patient does not wish to contact the pharmacy document the reason why and proceed with request.) (Agent: If yes, when and what did the pharmacy advise?)  Is this the correct pharmacy for this prescription? Yes If no, delete pharmacy and type the correct one.  This is the patient's preferred pharmacy:  [facility] - [place], [place] - [address] [place] [address] Phone: [phone] Fax: [phone]  [id number] - [place], [place] - [address] [place] [address] Phone: [phone] Fax: [phone]   Has the prescription been filled recently? No  Is the patient out of the medication? Yes  Has the patient been seen for an appointment in the last year OR does the patient have an upcoming appointment? Yes  Can we respond through MyChart? Yes  Agent: Please be advised that Rx refills may take up to 3 business days. We ask that you follow-up with your pharmacy.

## 2023-08-31 ENCOUNTER — Other Ambulatory Visit: Payer: Self-pay | Admitting: Family Medicine

## 2023-08-31 DIAGNOSIS — F4310 Post-traumatic stress disorder, unspecified: Secondary | ICD-10-CM

## 2023-08-31 NOTE — Telephone Encounter (Signed)
 Copied from CRM [id number]. Topic: Clinical - Medication Refill >> Aug 31, 2023 10:25 AM Myrtice Lauth wrote: Most Recent Primary Care Visit:  Provider: Sharlene Dory  Department: [facility]  Visit Type: OFFICE VISIT  Date: 04/06/2023  Medication: traZODone (DESYREL) 50 MG tablet  Has the patient contacted their pharmacy? Yes (Agent: If no, request that the patient contact the pharmacy for the refill. If patient does not wish to contact the pharmacy document the reason why and proceed with request.) (Agent: If yes, when and what did the pharmacy advise?)  Is this the correct pharmacy for this prescription? Yes If no, delete pharmacy and type the correct one.  This is the patient's preferred pharmacy:  [facility] - [place], [place] - [address] [place] [address] Phone: [phone] Fax: [phone]  [id number] - [place], [place] - [address] [place] [address] Phone: [phone] Fax: [phone]   Has the prescription been filled recently? Yes  Is the patient out of the medication? Yes  Has the patient been seen for an appointment in the last year OR does the patient have an upcoming appointment? Yes  Can we respond through MyChart? Yes  Agent: Please be advised that Rx refills may take up to 3 business days. We ask that you follow-up with your pharmacy.

## 2023-09-01 MED ORDER — TRAZODONE HCL 50 MG PO TABS: 25.0000 mg | ORAL_TABLET | Freq: Every evening | ORAL | 0 refills | Status: DC | PRN

## 2023-09-14 ENCOUNTER — Other Ambulatory Visit: Payer: Self-pay | Admitting: Family Medicine

## 2023-09-14 DIAGNOSIS — F4310 Post-traumatic stress disorder, unspecified: Secondary | ICD-10-CM

## 2023-09-14 NOTE — Telephone Encounter (Signed)
 Requesting: alprazolam 0.25mg  Contract:09/24/22 UDS:09/15/22 Last Visit: 04/06/23 Next Visit: 10/06/23 Last Refill: 03/19/23 #60 and 5RF   Please Advise

## 2023-09-20 ENCOUNTER — Other Ambulatory Visit: Payer: Self-pay | Admitting: Family Medicine

## 2023-09-20 DIAGNOSIS — K219 Gastro-esophageal reflux disease without esophagitis: Secondary | ICD-10-CM

## 2023-10-03 ENCOUNTER — Other Ambulatory Visit: Payer: Self-pay | Admitting: Family Medicine

## 2023-10-03 DIAGNOSIS — E7800 Pure hypercholesterolemia, unspecified: Secondary | ICD-10-CM

## 2023-10-03 DIAGNOSIS — E1165 Type 2 diabetes mellitus with hyperglycemia: Secondary | ICD-10-CM

## 2023-10-06 ENCOUNTER — Encounter: Payer: Self-pay | Admitting: Family Medicine

## 2023-10-06 ENCOUNTER — Other Ambulatory Visit: Payer: Self-pay

## 2023-10-06 ENCOUNTER — Ambulatory Visit: Payer: Medicare Other | Admitting: Family Medicine

## 2023-10-06 VITALS — BP 128/86 | HR 65 | Temp 98.5°F | Ht 63.0 in | Wt 130.0 lb

## 2023-10-06 DIAGNOSIS — R799 Abnormal finding of blood chemistry, unspecified: Secondary | ICD-10-CM

## 2023-10-06 DIAGNOSIS — E1165 Type 2 diabetes mellitus with hyperglycemia: Secondary | ICD-10-CM

## 2023-10-06 DIAGNOSIS — Z7984 Long term (current) use of oral hypoglycemic drugs: Secondary | ICD-10-CM

## 2023-10-06 DIAGNOSIS — F32A Depression, unspecified: Secondary | ICD-10-CM | POA: Diagnosis not present

## 2023-10-06 DIAGNOSIS — F419 Anxiety disorder, unspecified: Secondary | ICD-10-CM

## 2023-10-06 LAB — CBC
HCT: 27.9 % — ABNORMAL LOW (ref 36.0–46.0)
Hemoglobin: 8.8 g/dL — ABNORMAL LOW (ref 12.0–15.0)
MCHC: 31.5 g/dL (ref 30.0–36.0)
MCV: 76.6 fl — ABNORMAL LOW (ref 78.0–100.0)
Platelets: 267 10*3/uL (ref 150.0–400.0)
RBC: 3.65 Mil/uL — ABNORMAL LOW (ref 3.87–5.11)
RDW: 17.3 % — ABNORMAL HIGH (ref 11.5–15.5)
WBC: 3.8 10*3/uL — ABNORMAL LOW (ref 4.0–10.5)

## 2023-10-06 LAB — LIPID PANEL
Cholesterol: 205 mg/dL — ABNORMAL HIGH (ref 0–200)
HDL: 83.1 mg/dL (ref >39.00)
LDL Cholesterol: 99 mg/dL (ref 0–99)
NonHDL: 121.55
Total CHOL/HDL Ratio: 2
Triglycerides: 115 mg/dL (ref 0.0–149.0)
VLDL: 23 mg/dL (ref 0.0–40.0)

## 2023-10-06 LAB — COMPREHENSIVE METABOLIC PANEL WITH GFR
ALT: 18 U/L (ref 0–35)
AST: 24 U/L (ref 0–37)
Albumin: 4 g/dL (ref 3.5–5.2)
Alkaline Phosphatase: 49 U/L (ref 39–117)
BUN: 10 mg/dL (ref 6–23)
CO2: 27 meq/L (ref 19–32)
Calcium: 9.1 mg/dL (ref 8.4–10.5)
Chloride: 105 meq/L (ref 96–112)
Creatinine, Ser: 0.72 mg/dL (ref 0.40–1.20)
GFR: 82.12 mL/min (ref >60.00)
Glucose, Bld: 99 mg/dL (ref 70–99)
Potassium: 4.6 meq/L (ref 3.5–5.1)
Sodium: 138 meq/L (ref 135–145)
Total Bilirubin: 0.3 mg/dL (ref 0.2–1.2)
Total Protein: 6 g/dL (ref 6.0–8.3)

## 2023-10-06 LAB — HEMOGLOBIN A1C: Hgb A1c MFr Bld: 6.3 % (ref 4.6–6.5)

## 2023-10-06 NOTE — Progress Notes (Signed)
 Subjective:   Chief Complaint  Patient presents with   Medical Management of Chronic Issues    Patient presents today for a 6 month follow-up   Quality Metric Gaps    AWV    Alisha Matthews Reasons Alisha Matthews is a 73 y.o. female here for follow-up of diabetes.   Alisha Matthews does routinely check her sugars daily.  Patient does not require insulin.   Medications include: metformin XR 500 mg bid Diet is healthy.  Exercise: walking, stair stepper, wt training No CP or SOB.   GAD Pt is currently being treated with Paxil 20 mg/d, xanax 0.25-0.5 mg bid prn.  Reports doing well since treatment. Having some stress.  No thoughts of harming self or others. No self-medication with alcohol, prescription drugs or illicit drugs. Pt is not following with a counselor/psychologist.  Past Medical History:  Diagnosis Date   Anemia, unspecified    Anxiety    Arthritis    Asthma    mild exercise induced asthma as a child    Barrett's esophagus    Breast cancer (HCC)    right breast invasive ductal carcinoma    Breast fibrocystic disorder    Bronchitis    Chicken pox    Constipation, chronic    Depressive disorder, not elsewhere classified    Diabetes mellitus    borderline   Exercise induced bronchospasm    Genital warts    GERD (gastroesophageal reflux disease)    Hyperlipidemia    Infected postoperative breast seroma    Insomnia, unspecified    Migraine, unspecified, without mention of intractable migraine without mention of status migrainosus    Obesity, unspecified    Osteoarthrosis, unspecified whether generalized or localized, ankle and foot    ankle/foot   Personal history of colonic polyps    Symptomatic menopausal or female climacteric states      Related testing: Retinal exam: Done Pneumovax: done  Objective:  BP 128/86   Pulse 65   Temp 98.5 F (36.9 C)   Ht 5\' 3"  (1.6 m)   Wt 130 lb (59 kg)   LMP 06/22/2000   SpO2 99%   BMI 23.03 kg/m  General:  Well developed, well nourished, in no  apparent distress Lungs:  CTAB, no access msc use Cardio:  RRR, no bruits, no LE edema Psych: Age appropriate judgment and insight  Assessment:   Type 2 diabetes mellitus with hyperglycemia, without long-term current use of insulin (HCC) - Plan: Comprehensive metabolic panel with GFR, Hemoglobin A1c, Lipid panel, CBC  Anxiety and depression   Plan:   Chronic, stable. Cont Lipitor 20 mg/d, Metformin XR 500 mg bid. Counseled on diet and exercise. Chronic, stable. Cont Paxil 20 mg/d, Xanax prn.  F/u in 6 mo. The patient voiced understanding and agreement to the plan.  Alisha Dials [place], DO 10/06/23 11:00 AM

## 2023-10-06 NOTE — Patient Instructions (Signed)
 Give [place] 2-3 business days to get the results of your labs back.   Keep the diet clean and stay active.  Let us know if you need anything.

## 2023-10-07 ENCOUNTER — Other Ambulatory Visit (INDEPENDENT_AMBULATORY_CARE_PROVIDER_SITE_OTHER)

## 2023-10-07 ENCOUNTER — Encounter: Payer: Self-pay | Admitting: Family Medicine

## 2023-10-07 DIAGNOSIS — R799 Abnormal finding of blood chemistry, unspecified: Secondary | ICD-10-CM

## 2023-10-07 LAB — IBC + FERRITIN
Ferritin: 5.7 ng/mL — ABNORMAL LOW (ref 10.0–291.0)
Iron: 24 ug/dL — ABNORMAL LOW (ref 42–145)
Saturation Ratios: 5.3 % — ABNORMAL LOW (ref 20.0–50.0)
TIBC: 456.4 ug/dL — ABNORMAL HIGH (ref 250.0–450.0)
Transferrin: 326 mg/dL (ref 212.0–360.0)

## 2023-10-15 ENCOUNTER — Other Ambulatory Visit (INDEPENDENT_AMBULATORY_CARE_PROVIDER_SITE_OTHER)

## 2023-10-15 DIAGNOSIS — R799 Abnormal finding of blood chemistry, unspecified: Secondary | ICD-10-CM | POA: Diagnosis not present

## 2023-10-15 LAB — URINALYSIS, ROUTINE W REFLEX MICROSCOPIC
Bilirubin Urine: NEGATIVE
Hgb urine dipstick: NEGATIVE
Ketones, ur: NEGATIVE
Nitrite: NEGATIVE
RBC / HPF: NONE SEEN (ref 0–?)
Specific Gravity, Urine: 1.015 (ref 1.000–1.030)
Total Protein, Urine: NEGATIVE
Urine Glucose: NEGATIVE
Urobilinogen, UA: 0.2 (ref 0.0–1.0)
pH: 6 (ref 5.0–8.0)

## 2023-10-15 NOTE — Progress Notes (Signed)
Lab on 

## 2023-10-21 ENCOUNTER — Encounter: Payer: Self-pay | Admitting: Family Medicine

## 2023-10-21 ENCOUNTER — Other Ambulatory Visit (INDEPENDENT_AMBULATORY_CARE_PROVIDER_SITE_OTHER)

## 2023-10-21 DIAGNOSIS — E1165 Type 2 diabetes mellitus with hyperglycemia: Secondary | ICD-10-CM | POA: Diagnosis not present

## 2023-10-21 LAB — CBC WITH DIFFERENTIAL/PLATELET
Basophils Absolute: 0.1 10*3/uL (ref 0.0–0.1)
Basophils Relative: 1 % (ref 0.0–3.0)
Eosinophils Absolute: 0.2 10*3/uL (ref 0.0–0.7)
Eosinophils Relative: 3 % (ref 0.0–5.0)
HCT: 30.3 % — ABNORMAL LOW (ref 36.0–46.0)
Hemoglobin: 9.9 g/dL — ABNORMAL LOW (ref 12.0–15.0)
Lymphocytes Relative: 36 % (ref 12.0–46.0)
Lymphs Abs: 2 10*3/uL (ref 0.7–4.0)
MCHC: 32.6 g/dL (ref 30.0–36.0)
MCV: 78 fl (ref 78.0–100.0)
Monocytes Absolute: 0.5 10*3/uL (ref 0.1–1.0)
Monocytes Relative: 8.2 % (ref 3.0–12.0)
Neutro Abs: 2.9 10*3/uL (ref 1.4–7.7)
Neutrophils Relative %: 51.8 % (ref 43.0–77.0)
Platelets: 276 10*3/uL (ref 150.0–400.0)
RBC: 3.89 Mil/uL (ref 3.87–5.11)
RDW: 19.9 % — ABNORMAL HIGH (ref 11.5–15.5)
WBC: 5.6 10*3/uL (ref 4.0–10.5)

## 2023-10-27 ENCOUNTER — Telehealth: Payer: Self-pay

## 2023-10-27 ENCOUNTER — Other Ambulatory Visit: Payer: Self-pay | Admitting: Family Medicine

## 2023-10-27 MED ORDER — PROMETHAZINE HCL 12.5 MG PO TABS: 12.5000 mg | ORAL_TABLET | Freq: Three times a day (TID) | ORAL | 0 refills | Status: AC | PRN

## 2023-10-27 NOTE — Telephone Encounter (Signed)
 Copied from CRM [id number]. Topic: Clinical - Medical Advice >> Oct 27, 2023  1:48 PM [place] A wrote: Reason for CRM: Patient called and said she has nausea and would like to have Promethazine  called in for if possible. Scheduled appointment for 11/01/23

## 2023-10-27 NOTE — Telephone Encounter (Signed)
Called pt was advised and stated understand

## 2023-11-01 ENCOUNTER — Encounter: Payer: Self-pay | Admitting: Family Medicine

## 2023-11-01 ENCOUNTER — Ambulatory Visit (INDEPENDENT_AMBULATORY_CARE_PROVIDER_SITE_OTHER): Admitting: Family Medicine

## 2023-11-01 VITALS — BP 116/68 | HR 72 | Temp 99.3°F | Ht 63.0 in | Wt 132.6 lb

## 2023-11-01 DIAGNOSIS — D509 Iron deficiency anemia, unspecified: Secondary | ICD-10-CM | POA: Diagnosis not present

## 2023-11-01 DIAGNOSIS — R799 Abnormal finding of blood chemistry, unspecified: Secondary | ICD-10-CM | POA: Diagnosis not present

## 2023-11-01 DIAGNOSIS — K2900 Acute gastritis without bleeding: Secondary | ICD-10-CM | POA: Diagnosis not present

## 2023-11-01 NOTE — Progress Notes (Signed)
 Chief Complaint  Patient presents with   Nausea    Subjective: Patient is a 73 y.o. female here for nausea.  Patient has been feeling overall poor and low energy over the past couple months.  She has been taking oral iron is finally starting to feel better.  She does stay well-hydrated overall.  Things got worse last week when she had gastroenteritis and we sent in promethazine  which did help.  Symptoms are largely recovered from since then.  She is still set to give us  a stool sample to ensure there is no blood in that.  Past Medical History:  Diagnosis Date   Anemia, unspecified    Anxiety    Arthritis    Asthma    mild exercise induced asthma as a child    Barrett's esophagus    Breast cancer (HCC)    right breast invasive ductal carcinoma    Breast fibrocystic disorder    Bronchitis    Chicken pox    Constipation, chronic    Depressive disorder, not elsewhere classified    Diabetes mellitus    borderline   Exercise induced bronchospasm    Genital warts    GERD (gastroesophageal reflux disease)    Hyperlipidemia    Infected postoperative breast seroma    Insomnia, unspecified    Migraine, unspecified, without mention of intractable migraine without mention of status migrainosus    Obesity, unspecified    Osteoarthrosis, unspecified whether generalized or localized, ankle and foot    ankle/foot   Personal history of colonic polyps    Symptomatic menopausal or female climacteric states     Objective: BP 116/68   Pulse 72   Temp 99.3 F (37.4 C)   Ht 5\' 3"  (1.6 m)   Wt 132 lb 9.6 oz (60.1 kg)   LMP 06/22/2000   SpO2 100%   BMI 23.49 kg/m  General: Awake, appears stated age Heart: RRR, no LE edema Lungs: CTAB, no rales, wheezes or rhonchi. No accessory muscle use Mouth: MMM Abd: BS+, NT, ND, soft Psych: Age appropriate judgment and insight, normal affect and mood  Assessment and Plan: Iron deficiency anemia, unspecified iron deficiency anemia  type  Abnormal blood cell count - Plan: Fecal occult blood, imunochemical  Acute gastritis without hemorrhage, unspecified gastritis type  1/2. F/u with looking for bleeding. May need to see GI again. Cont OTC supplementation daily. List of foods rich in Fe provided in AVS.  3. Getting better overall, did well w Phenergan  prn.  The patient voiced understanding and agreement to the plan.  Alisha Dials [place], DO 11/01/23  12:52 PM

## 2023-11-01 NOTE — Patient Instructions (Addendum)
 Iron Rich foods:  Red meat Prunes Spinach Liver Cream of Wheat  Moderate sources: Iron-fortified cereals (bran, etc) Almonds Beans/peas Pork Lamb Ham Scallops [place] Peaches Peanuts  We will be in touch with your results.   Let us  know if you need anything.

## 2023-11-02 ENCOUNTER — Other Ambulatory Visit: Payer: Self-pay

## 2023-11-02 ENCOUNTER — Ambulatory Visit: Payer: Self-pay | Admitting: Family Medicine

## 2023-11-02 DIAGNOSIS — D509 Iron deficiency anemia, unspecified: Secondary | ICD-10-CM

## 2023-11-02 LAB — FECAL OCCULT BLOOD, IMMUNOCHEMICAL: Fecal Occult Bld: POSITIVE — AB

## 2023-11-12 DIAGNOSIS — A932 Colorado tick fever: Secondary | ICD-10-CM | POA: Diagnosis not present

## 2023-11-23 ENCOUNTER — Other Ambulatory Visit: Payer: Self-pay | Admitting: Family Medicine

## 2023-11-23 DIAGNOSIS — E1165 Type 2 diabetes mellitus with hyperglycemia: Secondary | ICD-10-CM

## 2023-11-26 ENCOUNTER — Encounter: Payer: Self-pay | Admitting: Physician Assistant

## 2023-11-26 ENCOUNTER — Ambulatory Visit: Admitting: Physician Assistant

## 2023-11-26 ENCOUNTER — Other Ambulatory Visit

## 2023-11-26 VITALS — BP 102/70 | HR 98 | Ht 63.0 in

## 2023-11-26 DIAGNOSIS — K760 Fatty (change of) liver, not elsewhere classified: Secondary | ICD-10-CM | POA: Diagnosis not present

## 2023-11-26 DIAGNOSIS — K7689 Other specified diseases of liver: Secondary | ICD-10-CM

## 2023-11-26 DIAGNOSIS — K648 Other hemorrhoids: Secondary | ICD-10-CM | POA: Diagnosis not present

## 2023-11-26 DIAGNOSIS — K2270 Barrett's esophagus without dysplasia: Secondary | ICD-10-CM | POA: Diagnosis not present

## 2023-11-26 DIAGNOSIS — K52831 Collagenous colitis: Secondary | ICD-10-CM

## 2023-11-26 DIAGNOSIS — K625 Hemorrhage of anus and rectum: Secondary | ICD-10-CM | POA: Diagnosis not present

## 2023-11-26 DIAGNOSIS — Z853 Personal history of malignant neoplasm of breast: Secondary | ICD-10-CM

## 2023-11-26 DIAGNOSIS — K2970 Gastritis, unspecified, without bleeding: Secondary | ICD-10-CM

## 2023-11-26 DIAGNOSIS — D509 Iron deficiency anemia, unspecified: Secondary | ICD-10-CM

## 2023-11-26 LAB — CBC WITH DIFFERENTIAL/PLATELET
Basophils Absolute: 0 10*3/uL (ref 0.0–0.1)
Basophils Relative: 0.8 % (ref 0.0–3.0)
Eosinophils Absolute: 0.1 10*3/uL (ref 0.0–0.7)
Eosinophils Relative: 2.3 % (ref 0.0–5.0)
HCT: 36.4 % (ref 36.0–46.0)
Hemoglobin: 11.9 g/dL — ABNORMAL LOW (ref 12.0–15.0)
Lymphocytes Relative: 34.9 % (ref 12.0–46.0)
Lymphs Abs: 1.9 10*3/uL (ref 0.7–4.0)
MCHC: 32.7 g/dL (ref 30.0–36.0)
MCV: 82.6 fl (ref 78.0–100.0)
Monocytes Absolute: 0.4 10*3/uL (ref 0.1–1.0)
Monocytes Relative: 7 % (ref 3.0–12.0)
Neutro Abs: 2.9 10*3/uL (ref 1.4–7.7)
Neutrophils Relative %: 55 % (ref 43.0–77.0)
Platelets: 255 10*3/uL (ref 150.0–400.0)
RBC: 4.41 Mil/uL (ref 3.87–5.11)
RDW: 23.2 % — ABNORMAL HIGH (ref 11.5–15.5)
WBC: 5.3 10*3/uL (ref 4.0–10.5)

## 2023-11-26 LAB — COMPREHENSIVE METABOLIC PANEL WITH GFR
ALT: 22 U/L (ref 0–35)
AST: 22 U/L (ref 0–37)
Albumin: 4.1 g/dL (ref 3.5–5.2)
Alkaline Phosphatase: 54 U/L (ref 39–117)
BUN: 13 mg/dL (ref 6–23)
CO2: 27 meq/L (ref 19–32)
Calcium: 9.4 mg/dL (ref 8.4–10.5)
Chloride: 101 meq/L (ref 96–112)
Creatinine, Ser: 0.81 mg/dL (ref 0.40–1.20)
GFR: 71.22 mL/min (ref >60.00)
Glucose, Bld: 102 mg/dL — ABNORMAL HIGH (ref 70–99)
Potassium: 4.1 meq/L (ref 3.5–5.1)
Sodium: 135 meq/L (ref 135–145)
Total Bilirubin: 0.3 mg/dL (ref 0.2–1.2)
Total Protein: 6.7 g/dL (ref 6.0–8.3)

## 2023-11-26 NOTE — Patient Instructions (Addendum)
 Your provider has requested that you go to the basement level for lab work before leaving today. Press "B" on the elevator. The lab is located at the first door on the left as you exit the elevator.   - Can try loperamide 4 mg initially, then 2 mg after each unformed stool for =2 days, with a maximum of 16 mg/day.  - If loperamide is not working, you could try bismuth salicylate (Pepto-Bismol) 30 mL or two tablets every 30 minutes for eight doses. Pepto-Bismol may make your stools black.   Go to the ER if any severe abdominal pain, fever, or weakness  Metabolic dysfunction associated seatohepatitis  Now the leading cause of liver failure in the [place] .  It is normally from such risk factors as obesity, diabetes, insulin  resistance, high cholesterol, or metabolic syndrome.  The only definitive therapy is weight loss and exercise.   Suggest walking 20-30 mins daily.  Decreasing carbohydrates, increasing veggies.    Fatty Liver Fatty liver is the accumulation of fat in liver cells. It is also called hepatosteatosis or steatohepatitis. It is normal for your liver to contain some fat. If fat is more than 5 to 10% of your liver's weight, you have fatty liver.  There are often no symptoms (problems) for years while damage is still occurring. People often learn about their fatty liver when they have medical tests for other reasons. Fat can damage your liver for years or even decades without causing problems. When it becomes severe, it can cause fatigue, weight loss, weakness, and confusion. This makes you more likely to develop more serious liver problems. The liver is the largest organ in the body. It does a lot of work and often gives no warning signs when it is sick until late in a disease. The liver has many important jobs including: Breaking down foods. Storing vitamins, iron, and other minerals. Making proteins. Making bile for food digestion. Breaking down many products including  medications, alcohol and some poisons.  PROGNOSIS  Fatty liver may cause no damage or it can lead to an inflammation of the liver. This is, called steatohepatitis.  Over time the liver may become scarred and hardened. This condition is called cirrhosis. Cirrhosis is serious and may lead to liver failure or cancer. NASH is one of the leading causes of cirrhosis. About 10-20% of Americans have fatty liver and a smaller 2-5% has NASH.  TREATMENT  Weight loss, fat restriction, and exercise in overweight patients produces inconsistent results but is worth trying. Good control of diabetes may reduce fatty liver. Eat a balanced, healthy diet. Increase your physical activity. There are no medical or surgical treatments for a fatty liver or NASH, but improving your diet and increasing your exercise may help prevent or reverse some of the damage.  Due to recent changes in healthcare laws, you may see the results of your imaging and laboratory studies on MyChart before your provider has had a chance to review them.  We understand that in some cases there may be results that are confusing or concerning to you. Not all laboratory results come back in the same time frame and the provider may be waiting for multiple results in order to interpret others.  Please give us  48 hours in order for your provider to thoroughly review all the results before contacting the office for clarification of your results.

## 2023-11-26 NOTE — Progress Notes (Addendum)
 11/26/2023 Alisha Matthews [id number] Nov 11, 1950  Referring provider: Jobe Mulder* Primary GI doctor: Dr. Yvone Herd (Dr. Sandrea Cruel)  ASSESSMENT AND PLAN:  IDA  Vegetarian, does not eat a lot of iron 11/01/2023 positive FOBT 10/21/2023  HGB 9.9 MCV 78.0 Platelets 276.0 10/07/2023 Iron 24 Ferritin 5.7 Recent Labs    10/06/23 1120 10/21/23 1317  HGB 8.8 Repeated and verified X2.* 9.9*  - check B12 iron/ferritin, consider referral to hematology - tolerating iron well once a day Differential includes internal hemorrhoids and potential upper GI source such as ulcers or gastritis. Iron levels improved with ferrous sulfate. - Order CBC to recheck hemoglobin and iron levels. - Perform upper endoscopy to evaluate for potential upper GI bleeding sources with colonoscopy last year that was unremarkable - Consider CT scan versus VCE if EGD negative  Rectal bleeding with internal hermorrhoids 06/30/2022 colonoscopy for diarrhea normal TI, diverticulosis left colon, internal hemorrhoids, random biopsies positive for collagenous colitis -Small volume BRB on TP after wiping -No significant bleeding  Collagenous colitis  Found on colonoscopy 06/2022, had bad experience with colonoscopy with dehydration/abnormal heart rate declined budesonide  9 mg p.o. daily, started on probiotic OTC that started to form her stools every 7-10 days soft stools some formed, has BM daily, worse with anxiety On diflofenac 75 mg BID, on paxil  20 mg, on nexium  40 mg -Avoid NSAIDs -Can use imodium AD/pepto on as needed basis. -Avoid lactose and high fructose  Gastritis and 1 cm length Barrett's esophagus, no GERD Status post cholecystectomy 2002 mild Barrett's esophagus, gastritis, hiatal hernia On nexium  40 mg daily No symptoms but she is on diflofecan BID, some concern for worsening barret's, gastritis, PUD, etc with iron def Will proceed with EGD  History of right breast cancer Grade 2 invasive ductal  carcinoma status postlumpectomy and radiation 2014  Fatty liver with complex cyst right lower lobe  seen on abdominal ultrasound 2013, no solid component No recent imaging Unremarkable liver function  I have reviewed the clinic note as outlined by Santina Cull, PA and agree with the assessment, plan and medical decision making.  [name] returns to the office with a history of collagenous colitis as well as recent anemia.  Colonoscopy performed in 2024 confirmed a diagnosis of collagenous colitis.  She declined budesonide  treatment and has been managing symptoms with a probiotic and Pepto-Bismol.  Recent labs showed iron deficiency anemia.  Last EGD was performed in 2002 with gastritis and Barrett's esophagus.  In the setting of IDA it is appropriate to update EGD.  Since colonoscopy was recent and reported to have a good bowel prep this does not need to be repeated.  Celiac testing performed at last visit was normal.  Prior records indicate a history of fatty liver-LFTs are normal.  [name], MD   Patient Care Team: Jobe Mulder, DO as PCP - General (Family Medicine) Candi Chafe, Pearletha Bouche, MD as Consulting Physician (Ophthalmology)  HISTORY OF PRESENT ILLNESS: 73 y.o. female with a past medical history listed below presents for evaluation of IDA.   Discussed the use of AI scribe software for clinical note transcription with the patient, who gave verbal consent to proceed.  History of Present Illness   Alisha Matthews is a 73 year old female with collagenous colitis and anemia who presents with concerns about her anemia and gastrointestinal symptoms.  She has a history of collagenous colitis, diagnosed during a colonoscopy in January of the previous year, along with diverticulosis and internal  hemorrhoids. She experiences periodic bouts of loose, watery stools, which worsened after the colonoscopy, leading to dehydration and irregular heartbeat. She manages her symptoms with  a probiotic called [employer] and Pepto Bismol as needed. Episodes of loose stools occur approximately once a week or every ten days and may be triggered by anxiety. She declined budesonide  treatment due to concerns about side effects and cost.  She has a history of anemia, first noted during a checkup two months ago, when low iron levels were detected. A fecal occult blood test revealed a small amount of blood in her stool. She believes her low iron levels may be due to dietary insufficiency, as she follows a mostly vegetarian diet. She has been taking ferrous sulfate 325 mg for seven weeks, which has improved her iron levels. No black stools, but she reports occasional bright red blood on toilet paper, likely from an external hemorrhoid.  Her past medical history includes a small segment of Barrett's esophagus, hiatal hernia, and inflammation found during an endoscopy in 2002. No current symptoms of GERD, such as heartburn or reflux, and she has not had an endoscopy since then. She takes Nexium  40 mg, diclofenac  75 mg, and misoprostol  200 mcg as part of her medication regimen. She also takes Xanax  and trazodone  for PTSD and sleep issues.  She maintains a daily exercise routine, including stair stepping and walking a mile, and reports good sleep and fluid intake. She has experienced weight loss associated with diarrhea episodes but has since stabilized. She consumes a protein shake and L-carnitine to support her nutrition.  No current nausea, vomiting, or difficulty swallowing. She has not experienced any significant weight loss recently, aside from the temporary loss during diarrhea episodes. No current issues with swallowing or unintentional weight loss.      She  reports that she has never smoked. She has never used smokeless tobacco. She reports that she does not drink alcohol and does not use drugs.  RELEVANT GI HISTORY, IMAGING AND LABS: Results   LABS CBC: RBC borderline normal  (10/05/2023) Iron: Low (10/05/2023) Ferritin: Low (10/05/2023) Fecal occult blood: Positive (09/2023) Hb: 9 g/dL (16/03/9603) Ferritin: 5.7 ng/mL (10/06/2023)  DIAGNOSTIC Colonoscopy: Diverticulosis, internal hemorrhoids, collagenous colitis (06/2022) Endoscopy: Small segment of Barrett's esophagus, inflammation, hiatal hernia (2002)      CBC    Component Value Date/Time   WBC 5.6 10/21/2023 1317   RBC 3.89 10/21/2023 1317   HGB 9.9 (L) 10/21/2023 1317   HGB 10.3 (L) 08/30/2018 1644   HGB 11.9 02/06/2014 0926   HCT 30.3 (L) 10/21/2023 1317   HCT 31.8 (L) 08/30/2018 1644   HCT 35.6 02/06/2014 0926   PLT 276.0 10/21/2023 1317   PLT 231 08/30/2018 1644   MCV 78.0 10/21/2023 1317   MCV 84 08/30/2018 1644   MCV 87.9 02/06/2014 0926   MCH 27.6 09/30/2021 1132   MCHC 32.6 10/21/2023 1317   RDW 19.9 (H) 10/21/2023 1317   RDW 13.6 08/30/2018 1644   RDW 12.9 02/06/2014 0926   LYMPHSABS 2.0 10/21/2023 1317   LYMPHSABS 1.5 08/30/2017 1036   LYMPHSABS 1.5 02/06/2014 0926   MONOABS 0.5 10/21/2023 1317   MONOABS 0.4 02/06/2014 0926   EOSABS 0.2 10/21/2023 1317   EOSABS 0.2 08/30/2017 1036   BASOSABS 0.1 10/21/2023 1317   BASOSABS 0.0 08/30/2017 1036   BASOSABS 0.0 02/06/2014 0926   Recent Labs    10/06/23 1120 10/21/23 1317  HGB 8.8 Repeated and verified X2.* 9.9*    CMP  Component Value Date/Time   NA 138 10/06/2023 1120   NA 136 08/30/2018 1644   NA 139 02/06/2014 0926   K 4.6 10/06/2023 1120   K 4.2 02/06/2014 0926   CL 105 10/06/2023 1120   CL 102 06/23/2012 1618   CO2 27 10/06/2023 1120   CO2 28 02/06/2014 0926   GLUCOSE 99 10/06/2023 1120   GLUCOSE 64 (L) 02/06/2014 0926   GLUCOSE 101 (H) 06/23/2012 1618   BUN 10 10/06/2023 1120   BUN 6 (L) 08/30/2018 1644   BUN 11.1 02/06/2014 0926   CREATININE 0.72 10/06/2023 1120   CREATININE 0.75 02/25/2016 1422   CREATININE 0.8 02/06/2014 0926   CALCIUM  9.1 10/06/2023 1120   CALCIUM  9.4 02/06/2014 0926   PROT  6.0 10/06/2023 1120   PROT 6.3 08/30/2018 1644   PROT 6.7 02/06/2014 0926   ALBUMIN 4.0 10/06/2023 1120   ALBUMIN 4.4 08/30/2018 1644   ALBUMIN 3.7 02/06/2014 0926   AST 24 10/06/2023 1120   AST 25 02/06/2014 0926   ALT 18 10/06/2023 1120   ALT 23 02/06/2014 0926   ALKPHOS 49 10/06/2023 1120   ALKPHOS 55 02/06/2014 0926   BILITOT 0.3 10/06/2023 1120   BILITOT <0.2 08/30/2018 1644   BILITOT 0.30 02/06/2014 0926   GFRNONAA >60 09/30/2021 1132   GFRNONAA >89 07/09/2014 0916   GFRAA 99 08/30/2018 1644   GFRAA >89 07/09/2014 0916      Latest Ref Rng & Units 10/06/2023   11:20 AM 04/06/2023   10:53 AM 09/15/2022   10:36 AM  Hepatic Function  Total Protein 6.0 - 8.3 g/dL 6.0  6.1  6.2   Albumin 3.5 - 5.2 g/dL 4.0  3.9  4.0   AST 0 - 37 U/L 24  25  22    ALT 0 - 35 U/L 18  19  19    Alk Phosphatase 39 - 117 U/L 49  47  51   Total Bilirubin 0.2 - 1.2 mg/dL 0.3  0.4  0.3       Current Medications:   Current Outpatient Medications (Endocrine & Metabolic):    metFORMIN  (GLUCOPHAGE -XR) 500 MG 24 hr tablet, Take 1 tablet (500 mg total) by mouth 2 (two) times daily.   budesonide  (ENTOCORT EC ) 3 MG 24 hr capsule, Take 3 capsules (9 mg total) by mouth daily. (Patient not taking: Reported on 11/26/2023)  Current Outpatient Medications (Cardiovascular):    atorvastatin  (LIPITOR) 20 MG tablet, TAKE ONE TABLET BY MOUTH DAILY  Current Outpatient Medications (Respiratory):    promethazine  (PHENERGAN ) 12.5 MG tablet, Take 1 tablet (12.5 mg total) by mouth every 8 (eight) hours as needed for nausea or vomiting.  Current Outpatient Medications (Analgesics):    diclofenac  (VOLTAREN ) 75 MG EC tablet, TAKE 1 TABLET BY MOUTH 2 TIMES DAILY   frovatriptan  (FROVA ) 2.5 MG tablet, Take 1 tablet (2.5 mg total) by mouth as needed for migraine (max 3 tabs in 24 hours). If recurs, may repeat after 2 hours. Max of 3 tabs in 24 hours.  Current Outpatient Medications (Hematological):    ferrous sulfate 325 (65  FE) MG EC tablet, Take 325 mg by mouth 3 (three) times daily with meals.  Current Outpatient Medications (Other):    ALPRAZolam  (XANAX ) 0.25 MG tablet, TAKE ONE TABLET BY MOUTH TWICE DAILY AS NEEDED FOR ANXIETY   Blood Glucose Monitoring Suppl (ACCU-CHEK GUIDE ME) w/Device KIT, USE DAILY TO CHECK BLOOD SUGAR. DX E11.9   Calcium -Magnesium -Vitamin D  (CALCIUM  500 PO), Take 500 mg by mouth  3 (three) times daily.   Coenzyme Q10 (COQ10) 200 MG CAPS, Take 200 mg by mouth daily.   esomeprazole  (NEXIUM ) 40 MG capsule, TAKE ONE CAPSULE BY MOUTH DAILY   LevOCARNitine (CARNITINE PO), Take 3,000 mg by mouth daily.    misoprostol  (CYTOTEC ) 200 MCG tablet, TAKE 1 TABLET BY MOUTH 2 TIMES DAILY.   Multiple Vitamins-Minerals (ANTIOXIDANT PO), Take 1 capsule by mouth daily. Fruit   OVER THE COUNTER MEDICATION, Take 1 capsule by mouth daily. Procaps package: Elite-100 omega 3 vitamin   OVER THE COUNTER MEDICATION, Take 1 capsule by mouth daily. Procaps: Memory,brain and liver vitamin   OVER THE COUNTER MEDICATION, Take 1 capsule by mouth daily. Circulation and vein support vitamin   PARoxetine  (PAXIL ) 20 MG tablet, Take 1 tablet (20 mg total) by mouth daily.   Probiotic Product (PROBIOTIC DAILY PO), Take 1 capsule by mouth daily.   traZODone  (DESYREL ) 50 MG tablet, Take 0.5-1 tablets (25-50 mg total) by mouth at bedtime as needed for sleep.  Medical History:  Past Medical History:  Diagnosis Date   Anemia, unspecified    Anxiety    Arthritis    Asthma    mild exercise induced asthma as a child    Barrett's esophagus    Breast cancer (HCC)    right breast invasive ductal carcinoma    Breast fibrocystic disorder    Bronchitis    Chicken pox    Constipation, chronic    Depressive disorder, not elsewhere classified    Diabetes mellitus    borderline   Exercise induced bronchospasm    Genital warts    GERD (gastroesophageal reflux disease)    Hyperlipidemia    Infected postoperative breast seroma     Insomnia, unspecified    Migraine, unspecified, without mention of intractable migraine without mention of status migrainosus    Obesity, unspecified    Osteoarthrosis, unspecified whether generalized or localized, ankle and foot    ankle/foot   Personal history of colonic polyps    Symptomatic menopausal or female climacteric states    Allergies:  Allergies  Allergen Reactions   Penicillins Rash     Surgical History:  She  has a past surgical history that includes Cholecystectomy; Bunionectomy; Nasal septum surgery; Total hip arthroplasty; Foot surgery; Breast lumpectomy with needle localization and axillary sentinel lymph node bx (06/08/2012); Re-excision of breast cancer,superior margins (06/27/2012); Eye surgery; Colonoscopy; Total knee arthroplasty (Left, 02/20/2014); Breast surgery; Joint replacement; Total shoulder arthroplasty (Right, 06/27/2020); dental implant; Cataract extraction, bilateral; and Reverse shoulder arthroplasty (Left, 10/09/2021). Family History:  Her family history includes Cancer in her maternal grandfather; Cancer (age of onset: 61) in her maternal grandmother; Deep vein thrombosis in her brother; Diabetes in her mother; Heart disease in her mother; Heart failure in her mother; Hyperlipidemia in her mother; Hypertension in her mother; Mental illness in her mother; Stroke in her mother. She was adopted.  REVIEW OF SYSTEMS  : All other systems reviewed and negative except where noted in the History of Present Illness.  PHYSICAL EXAM: BP 102/70   Pulse 98   Ht 5\' 3"  (1.6 m)   LMP 06/22/2000   BMI 23.49 kg/m  Physical Exam   GENERAL APPEARANCE: Well nourished, in no apparent distress. HEENT: No cervical lymphadenopathy, unremarkable thyroid , sclerae anicteric, conjunctiva pink. RESPIRATORY: Respiratory effort normal, breath sounds equal bilaterally without rales, rhonchi, or wheezing. CARDIO: Regular rate and rhythm with no murmurs, rubs, or gallops, peripheral  pulses intact. ABDOMEN: Soft, non-distended, active bowel sounds  in all four quadrants, non-tender to palpation, no rebound, no mass appreciated. RECTAL: Declines. MUSCULOSKELETAL: Full range of motion, normal gait, without edema. SKIN: Dry, intact without rashes or lesions. No jaundice. NEURO: Alert, oriented, no focal deficits. PSYCH: Cooperative, normal mood and affect.      Edmonia Gottron, PA-C 11:28 AM

## 2023-11-27 LAB — IGA: Immunoglobulin A: 97 mg/dL (ref 70–320)

## 2023-11-27 LAB — TISSUE TRANSGLUTAMINASE, IGA: (tTG) Ab, IgA: <1 U/mL

## 2023-11-29 NOTE — Progress Notes (Unsigned)
 [facility] History and Physical   Primary Care Physician:  Jobe Mulder, DO   Reason for Procedure:  Iron deficiency anemia, history of gastritis and nondysplastic Barrett's esophagus  Plan:    Upper endoscopy     HPI: Alisha Matthews is a 73 y.o. female undergoing upper endoscopy for investigation of iron deficiency anemia, history of gastritis and nondysplastic Barrett's esophagus.  Labs in May 2025 disclosed hemoglobin 9.9 with MCV 78.  Iron was low at 105-month prior.  FOBT positive.  Patient is vegetarian and does not eat a lot of iron.  Colonoscopy in 2024 did not show any polyps but did yield evidence of microscopic colitis on biopsy.  Terminal ileum was normal.  Diagnosed with Barrett's esophagus over 20 years ago.  Denies GERD symptoms.  No family history of colorectal cancer or polyps.   Past Medical History:  Diagnosis Date   Anxiety    Arthritis    Barrett's esophagus    Breast cancer (HCC)    right breast invasive ductal carcinoma    Breast fibrocystic disorder    Bronchitis    Cataract    Chicken pox    Constipation, chronic    Depressive disorder, not elsewhere classified    Diabetes mellitus    borderline   Exercise induced bronchospasm    Genital warts    GERD (gastroesophageal reflux disease)    Hyperlipidemia    Infected postoperative breast seroma    Insomnia, unspecified    Migraine, unspecified, without mention of intractable migraine without mention of status migrainosus    Obesity, unspecified    Osteoarthrosis, unspecified whether generalized or localized, ankle and foot    ankle/foot   Personal history of colonic polyps    Symptomatic menopausal or female climacteric states     Past Surgical History:  Procedure Laterality Date   BREAST LUMPECTOMY WITH NEEDLE LOCALIZATION AND AXILLARY SENTINEL LYMPH NODE BX  06/08/2012   Procedure: BREAST LUMPECTOMY WITH NEEDLE LOCALIZATION AND AXILLARY SENTINEL LYMPH NODE BX;  Surgeon:  Enid Harry, MD;  Location: [facility];  Service: General;  Laterality: Right;   BREAST SURGERY     BUNIONECTOMY     3 surgeries on both feet   CATARACT EXTRACTION, BILATERAL     2022. 2023   CHOLECYSTECTOMY     COLONOSCOPY     dental implant     EYE SURGERY     Orbital right eye surgery   FOOT SURGERY     Left-revision   JOINT REPLACEMENT     NASAL SEPTUM SURGERY     RE-EXCISION OF BREAST CANCER,SUPERIOR MARGINS  06/27/2012   Procedure: RE-EXCISION OF BREAST CANCER,SUPERIOR MARGINS;  Surgeon: Enid Harry, MD;  Location:  SURGERY CENTER;  Service: General;  Laterality: Right;   REVERSE SHOULDER ARTHROPLASTY Left 10/09/2021   Procedure: REVERSE SHOULDER ARTHROPLASTY;  Surgeon: Sammye Cristal, MD;  Location: [facility];  Service: Orthopedics;  Laterality: Left;   TOTAL HIP ARTHROPLASTY     left      Perthes disease   TOTAL KNEE ARTHROPLASTY Left 02/20/2014   Procedure: TOTAL KNEE ARTHROPLASTY;  Surgeon: Alphonzo Ask, MD;  Location: [facility];  Service: Orthopedics;  Laterality: Left;   TOTAL SHOULDER ARTHROPLASTY Right 06/27/2020   Procedure: TOTAL SHOULDER ARTHROPLASTY;  Surgeon: Sammye Cristal, MD;  Location: [facility];  Service: Orthopedics;  Laterality: Right;    Prior to Admission medications   Medication Sig Start Date End Date Taking? Authorizing Provider  ALPRAZolam  (XANAX ) 0.25  MG tablet TAKE ONE TABLET BY MOUTH TWICE DAILY AS NEEDED FOR ANXIETY 09/14/23   Jobe Mulder, DO  atorvastatin  (LIPITOR) 20 MG tablet TAKE ONE TABLET BY MOUTH DAILY 10/04/23   Jobe Mulder, DO  Blood Glucose Monitoring Suppl (ACCU-CHEK GUIDE ME) w/Device KIT USE DAILY TO CHECK BLOOD SUGAR. DX E11.9 03/17/21   Jobe Mulder, DO  budesonide  (ENTOCORT EC ) 3 MG 24 hr capsule Take 3 capsules (9 mg total) by mouth daily. Patient not taking: Reported on 11/26/2023 07/10/22   Asencion Blacksmith, MD  Calcium -Magnesium -Vitamin D  (CALCIUM  500 PO) Take 500  mg by mouth 3 (three) times daily.    [provider]  Coenzyme Q10 (COQ10) 200 MG CAPS Take 200 mg by mouth daily.    [provider]  diclofenac  (VOLTAREN ) 75 MG EC tablet TAKE 1 TABLET BY MOUTH 2 TIMES DAILY 10/04/23   Jobe Mulder, DO  esomeprazole  (NEXIUM ) 40 MG capsule TAKE ONE CAPSULE BY MOUTH DAILY 09/20/23   Jobe Mulder, DO  ferrous sulfate 325 (65 FE) MG EC tablet Take 325 mg by mouth 3 (three) times daily with meals.    [provider]  frovatriptan  (FROVA ) 2.5 MG tablet Take 1 tablet (2.5 mg total) by mouth as needed for migraine (max 3 tabs in 24 hours). If recurs, may repeat after 2 hours. Max of 3 tabs in 24 hours. 03/02/17   Smith, Kristi M, MD  LevOCARNitine (CARNITINE PO) Take 3,000 mg by mouth daily.     [provider]  metFORMIN  (GLUCOPHAGE -XR) 500 MG 24 hr tablet Take 1 tablet (500 mg total) by mouth 2 (two) times daily. 11/23/23   Jobe Mulder, DO  misoprostol  (CYTOTEC ) 200 MCG tablet TAKE 1 TABLET BY MOUTH 2 TIMES DAILY. 05/22/23   Jobe Mulder, DO  Multiple Vitamins-Minerals (ANTIOXIDANT PO) Take 1 capsule by mouth daily. Fruit    [provider]  OVER THE COUNTER MEDICATION Take 1 capsule by mouth daily. Procaps package: Elite-100 omega 3 vitamin    [provider]  OVER THE COUNTER MEDICATION Take 1 capsule by mouth daily. Procaps: Memory,brain and liver vitamin    [provider]  OVER THE COUNTER MEDICATION Take 1 capsule by mouth daily. Circulation and vein support vitamin    [provider]  PARoxetine  (PAXIL ) 20 MG tablet Take 1 tablet (20 mg total) by mouth daily. 08/16/23   Jobe Mulder, DO  Probiotic Product (PROBIOTIC DAILY PO) Take 1 capsule by mouth daily.    [provider]  promethazine  (PHENERGAN ) 12.5 MG tablet Take 1 tablet (12.5 mg total) by mouth every 8 (eight) hours as needed for nausea or vomiting. 10/27/23   Jobe Mulder, DO  traZODone  (DESYREL ) 50 MG tablet Take 0.5-1 tablets (25-50 mg total) by mouth at bedtime as needed for sleep. 09/01/23   Jobe Mulder, DO    Current Outpatient Medications  Medication Sig Dispense Refill   ALPRAZolam  (XANAX ) 0.25 MG tablet TAKE ONE TABLET BY MOUTH TWICE DAILY AS NEEDED FOR ANXIETY 60 tablet 5   atorvastatin  (LIPITOR) 20 MG tablet TAKE ONE TABLET BY MOUTH DAILY 90 tablet 3   Calcium -Magnesium -Vitamin D  (CALCIUM  500 PO) Take 500 mg by mouth 3 (three) times daily.     Coenzyme Q10 (COQ10) 200 MG CAPS Take 200 mg by mouth daily.     diclofenac  (VOLTAREN ) 75 MG EC tablet TAKE 1 TABLET BY MOUTH 2 TIMES DAILY 180 tablet 3  esomeprazole  (NEXIUM ) 40 MG capsule TAKE ONE CAPSULE BY MOUTH DAILY 90 capsule 3   ferrous sulfate 325 (65 FE) MG EC tablet Take 325 mg by mouth 3 (three) times daily with meals.     LevOCARNitine (CARNITINE PO) Take 3,000 mg by mouth daily.      metFORMIN  (GLUCOPHAGE -XR) 500 MG 24 hr tablet Take 1 tablet (500 mg total) by mouth 2 (two) times daily. 180 tablet 1   misoprostol  (CYTOTEC ) 200 MCG tablet TAKE 1 TABLET BY MOUTH 2 TIMES DAILY. 180 tablet 2   Multiple Vitamins-Minerals (ANTIOXIDANT PO) Take 1 capsule by mouth daily. Fruit     OVER THE COUNTER MEDICATION Take 1 capsule by mouth daily. Procaps package: Elite-100 omega 3 vitamin     OVER THE COUNTER MEDICATION Take 1 capsule by mouth daily. Procaps: Memory,brain and liver vitamin     OVER THE COUNTER MEDICATION Take 1 capsule by mouth daily. Circulation and vein support vitamin     PARoxetine  (PAXIL ) 20 MG tablet Take 1 tablet (20 mg total) by mouth daily. 90 tablet 0   Probiotic Product (PROBIOTIC DAILY PO) Take 1 capsule by mouth daily.     promethazine  (PHENERGAN ) 12.5 MG tablet Take 1 tablet (12.5 mg total) by mouth every 8 (eight) hours as needed for nausea or vomiting. 20 tablet 0   traZODone  (DESYREL ) 50 MG tablet Take 0.5-1 tablets (25-50 mg total) by mouth at bedtime as needed  for sleep. 90 tablet 0   Blood Glucose Monitoring Suppl (ACCU-CHEK GUIDE ME) w/Device KIT USE DAILY TO CHECK BLOOD SUGAR. DX E11.9 (Patient not taking: Reported on 12/01/2023) 1 kit 0   budesonide  (ENTOCORT EC ) 3 MG 24 hr capsule Take 3 capsules (9 mg total) by mouth daily. (Patient not taking: Reported on 12/01/2023) 180 capsule 0   frovatriptan  (FROVA ) 2.5 MG tablet Take 1 tablet (2.5 mg total) by mouth as needed for migraine (max 3 tabs in 24 hours). If recurs, may repeat after 2 hours. Max of 3 tabs in 24 hours. (Patient not taking: Reported on 12/01/2023) 10 tablet 1   Current Facility-Administered Medications  Medication Dose Route Frequency Provider Last Rate Last Admin   0.9 %  sodium chloride  infusion  500 mL Intravenous Once Truddie Furrow, MD        Allergies as of 12/01/2023 - Review Complete 12/01/2023  Allergen Reaction Noted   Penicillins Rash 08/04/2011    Family History  Adopted: Yes  Problem Relation Age of Onset   Hypertension Mother    Diabetes Mother    Heart failure Mother    Heart disease Mother    Hyperlipidemia Mother    Stroke Mother    Mental illness Mother    Cancer Maternal Grandmother 29       non hodgkins lymphoma   Cancer Maternal Grandfather    Deep vein thrombosis Brother     Social History   Socioeconomic History   Marital status: Married    Spouse name: Myrtie Atkinson   Number of children: 0   Years of education: college   Highest education level: Not on file  Occupational History   Occupation: retired    Associate Professor: [place]    Comment: previous CMA at [employer]  Tobacco Use   Smoking status: Never   Smokeless tobacco: Never  Vaping Use   Vaping status: Never Used  Substance and Sexual Activity   Alcohol use: No    Comment: socially   Drug use: No   Sexual activity:  Never    Partners: Male    Birth control/protection: Post-menopausal    Comment: post menopausal  Other Topics Concern   Not on file  Social History Narrative   Marital  status:  Married x 19 years happily, second marriage. No domestic abuse; 5 cats; no children.      Lives: with husband, 5 cats.      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      Tobacco: none       Alcohol: none      Drugs: none      Exercise:  Stair stepper x 30 minutes five days per week.  Spin gym weights daily.  Gardening.      Caffeine: + Moderate caffeine use.          Seatbelt: 100%; no texting while driving.      Guns:  Unloaded gun.      Advanced Directives:  Living Will --- FULL CODE but no prolonged measures.        ADLs: independent with ADLs; no assistant devices; drives.   Social Drivers of Corporate investment banker Strain: Low Risk  (12/04/2020)   Overall Financial Resource Strain (CARDIA)    Difficulty of Paying Living Expenses: Not hard at all  Food Insecurity: No Food Insecurity (12/04/2020)   Hunger Vital Sign    Worried About Running Out of Food in the Last Year: Never true    Ran Out of Food in the Last Year: Never true  Transportation Needs: No Transportation Needs (12/04/2020)   PRAPARE - Administrator, Civil Service (Medical): No    Lack of Transportation (Non-Medical): No  Physical Activity: Sufficiently Active (12/04/2020)   Exercise Vital Sign    Days of Exercise per Week: 7 days    Minutes of Exercise per Session: 30 min  Stress: No Stress Concern Present (12/04/2020)   [employer] of Occupational Health - Occupational Stress Questionnaire    Feeling of Stress : Not at all  Social Connections: Moderately Isolated (12/04/2020)   Social Connection and Isolation Panel [NHANES]    Frequency of Communication with Friends and Family: More than three times a week    Frequency of Social Gatherings with Friends and Family: More than three times a week    Attends Religious Services: Never    Database administrator or Organizations: No    Attends Banker Meetings: Never    Marital  Status: Married  Catering manager Violence: Not At Risk (12/04/2020)   Humiliation, Afraid, Rape, and Kick questionnaire    Fear of Current or Ex-Partner: No    Emotionally Abused: No    Physically Abused: No    Sexually Abused: No    Review of Systems:  All other review of systems negative except as mentioned in the HPI.  Physical Exam: Vital signs BP 104/60   Pulse 73   Temp 98.1 F (36.7 C) (Temporal)   Ht 5' 3 (1.6 m)   Wt 132 lb (59.9 kg)   LMP 06/22/2000   SpO2 97%   BMI 23.38 kg/m   General:   Alert,  Well-developed, well-nourished, pleasant and cooperative in NAD Airway:  Mallampati 2 Lungs:  Clear throughout to auscultation.   Heart:  Regular rate and rhythm; no murmurs, clicks, rubs,  or gallops. Abdomen:  Soft, nontender and nondistended. Normal bowel sounds.   Neuro/Psych:  Normal mood and affect.  A and O x 3  [name], MD [facility]

## 2023-11-30 ENCOUNTER — Ambulatory Visit: Payer: Self-pay | Admitting: Physician Assistant

## 2023-12-01 ENCOUNTER — Encounter: Payer: Self-pay | Admitting: Pediatrics

## 2023-12-01 ENCOUNTER — Ambulatory Visit: Admitting: Pediatrics

## 2023-12-01 VITALS — BP 91/51 | HR 64 | Temp 98.1°F | Resp 16 | Ht 63.0 in | Wt 132.0 lb

## 2023-12-01 DIAGNOSIS — D509 Iron deficiency anemia, unspecified: Secondary | ICD-10-CM | POA: Diagnosis not present

## 2023-12-01 DIAGNOSIS — K2270 Barrett's esophagus without dysplasia: Secondary | ICD-10-CM

## 2023-12-01 DIAGNOSIS — E669 Obesity, unspecified: Secondary | ICD-10-CM | POA: Diagnosis not present

## 2023-12-01 DIAGNOSIS — F32A Depression, unspecified: Secondary | ICD-10-CM | POA: Diagnosis not present

## 2023-12-01 DIAGNOSIS — Z8719 Personal history of other diseases of the digestive system: Secondary | ICD-10-CM | POA: Diagnosis not present

## 2023-12-01 DIAGNOSIS — K2950 Unspecified chronic gastritis without bleeding: Secondary | ICD-10-CM | POA: Diagnosis not present

## 2023-12-01 DIAGNOSIS — K449 Diaphragmatic hernia without obstruction or gangrene: Secondary | ICD-10-CM | POA: Diagnosis not present

## 2023-12-01 DIAGNOSIS — E119 Type 2 diabetes mellitus without complications: Secondary | ICD-10-CM | POA: Diagnosis not present

## 2023-12-01 DIAGNOSIS — F419 Anxiety disorder, unspecified: Secondary | ICD-10-CM | POA: Diagnosis not present

## 2023-12-01 DIAGNOSIS — K3189 Other diseases of stomach and duodenum: Secondary | ICD-10-CM | POA: Diagnosis not present

## 2023-12-01 MED ORDER — SODIUM CHLORIDE 0.9 % IV SOLN: 500.0000 mL | Freq: Once | INTRAVENOUS | Status: DC

## 2023-12-01 NOTE — Progress Notes (Signed)
 Vss nad trans to pacu

## 2023-12-01 NOTE — Progress Notes (Signed)
 Called to room to assist during endoscopic procedure.  Patient ID and intended procedure confirmed with present staff. Received instructions for my participation in the procedure from the performing physician.

## 2023-12-01 NOTE — Op Note (Signed)
 [facility] Patient Name: Alisha Matthews Procedure Date: 12/01/2023 3:20 PM MRN: [id number] Endoscopist: [name] , MD, [id number] Age: 73 Referring MD:  Date of Birth: Aug 27, 1950 Gender: Female Account #: [id number] Procedure:                Upper GI endoscopy Indications:              Iron deficiency anemia, Surveillance for malignancy                            due to personal history of Barrett's esophagus,                            Follow-up of gastritis Medicines:                Monitored Anesthesia Care Procedure:                Pre-Anesthesia Assessment:                           - Prior to the procedure, a History and Physical                            was performed, and patient medications and                            allergies were reviewed. The patient's tolerance of                            previous anesthesia was also reviewed. The risks                            and benefits of the procedure and the sedation                            options and risks were discussed with the patient.                            All questions were answered, and informed consent                            was obtained. Prior Anticoagulants: The patient has                            taken no anticoagulant or antiplatelet agents. ASA                            Grade Assessment: II - A patient with mild systemic                            disease. After reviewing the risks and benefits,                            the patient was deemed in satisfactory condition to  undergo the procedure.                           After obtaining informed consent, the endoscope was                            passed under direct vision. Throughout the                            procedure, the patient's blood pressure, pulse, and                            oxygen saturations were monitored continuously. The                            Olympus Scope SN [id number] was  introduced through the                            mouth, and advanced to the second part of duodenum.                            The upper GI endoscopy was accomplished without                            difficulty. The patient tolerated the procedure                            well. Scope In: 3:32:59 PM Scope Out: 3:42:04 PM Total Procedure Duration: 0 hours 9 minutes 5 seconds  Findings:                 The upper third of the esophagus and middle third                            of the esophagus were normal.                           There were esophageal mucosal changes suspicious                            for short-segment Barrett's esophagus present in                            the lower third of the esophagus characterized by a                            single tongue of salmon colored mucosa. The maximum                            longitudinal extent of these mucosal changes was 1                            cm in length. Mucosa was biopsied with a cold  forceps for histology. One specimen bottle was sent                            to pathology.                           The gastric body and gastric antrum were normal.                            Biopsies were taken with a cold forceps for                            Helicobacter pylori testing.                           Localized mild inflammation characterized by                            erythema was found in the gastric fundus. Biopsies                            were taken with a cold forceps for Helicobacter                            pylori testing.                           The cardia (on retroflexion) was normal.                           A small hiatal hernia was present.                           The duodenal bulb and second portion of the                            duodenum were normal. Biopsies for histology were                            taken with a cold forceps for evaluation of celiac                             disease. Complications:            No immediate complications. Estimated blood loss:                            Minimal. Estimated Blood Loss:     Estimated blood loss was minimal. Impression:               - Normal upper third of esophagus and middle third                            of esophagus.                           -  Esophageal mucosal changes suspicious for                            short-segment Barrett's esophagus. Biopsied.                           - Normal gastric body and antrum. Biopsied.                           - Gastritis. Biopsied.                           - Normal cardia.                           - Small hiatal hernia.                           - Normal duodenal bulb and second portion of the                            duodenum. Biopsied. Recommendation:           - Discharge patient to home (ambulatory).                           - Await pathology results.                           - Continue Nexium  40 mg orally daily.                           - The findings and recommendations were discussed                            with the patient's family.                           - Return to GI clinic in 2 months with Dr. Yvone Herd                            or APP>                           - Patient has a contact number available for                            emergencies. The signs and symptoms of potential                            delayed complications were discussed with the                            patient. Return to normal activities tomorrow.                            Written discharge instructions were provided to the  patient. [name], MD 12/01/2023 3:49:41 PM This report has been signed electronically.

## 2023-12-01 NOTE — Progress Notes (Signed)
 Patient has a chipped tooth on the upper left of her mouth.

## 2023-12-01 NOTE — Patient Instructions (Signed)
Discharge instructions given. Biopsies taken. Office visit scheduled. Resume previous medications. YOU HAD AN ENDOSCOPIC PROCEDURE TODAY AT THE [facility]:   Refer to the procedure report that was given to you for any specific questions about what was found during the examination.  If the procedure report does not answer your questions, please call your gastroenterologist to clarify.  If you requested that your care partner not be given the details of your procedure findings, then the procedure report has been included in a sealed envelope for you to review at your convenience later.  YOU SHOULD EXPECT: Some feelings of bloating in the abdomen. Passage of more gas than usual.  Walking can help get rid of the air that was put into your GI tract during the procedure and reduce the bloating. If you had a lower endoscopy (such as a colonoscopy or flexible sigmoidoscopy) you may notice spotting of blood in your stool or on the toilet paper. If you underwent a bowel prep for your procedure, you may not have a normal bowel movement for a few days.  Please Note:  You might notice some irritation and congestion in your nose or some drainage.  This is from the oxygen used during your procedure.  There is no need for concern and it should clear up in a day or so.  SYMPTOMS TO REPORT IMMEDIATELY: Following upper endoscopy (EGD)  Vomiting of blood or coffee ground material  New chest pain or pain under the shoulder blades  Painful or persistently difficult swallowing  New shortness of breath  Fever of 100F or higher  Black, tarry-looking stools  For urgent or emergent issues, a gastroenterologist can be reached at any hour by calling [phone]. Do not use MyChart messaging for urgent concerns.    DIET:  We do recommend a small meal at first, but then you may proceed to your regular diet.  Drink plenty of fluids but you should avoid alcoholic beverages for 24 hours.  ACTIVITY:  You  should plan to take it easy for the rest of today and you should NOT DRIVE or use heavy machinery until tomorrow (because of the sedation medicines used during the test).    FOLLOW UP: Our staff will call the number listed on your records the next business day following your procedure.  We will call around 7:15- 8:00 am to check on you and address any questions or concerns that you may have regarding the information given to you following your procedure. If we do not reach you, we will leave a message.     If any biopsies were taken you will be contacted by phone or by letter within the next 1-3 weeks.  Please call us at [phone] if you have not heard about the biopsies in 3 weeks.    SIGNATURES/CONFIDENTIALITY: You and/or your care partner have signed paperwork which will be entered into your electronic medical record.  These signatures attest to the fact that that the information above on your After Visit Summary has been reviewed and is understood.  Full responsibility of the confidentiality of this discharge information lies with you and/or your care-partner.

## 2023-12-02 ENCOUNTER — Telehealth: Payer: Self-pay

## 2023-12-02 LAB — IBC + FERRITIN
Ferritin: 9.7 ng/mL — ABNORMAL LOW (ref 10.0–291.0)
Iron: 63 ug/dL (ref 42–145)
Saturation Ratios: 16.6 % — ABNORMAL LOW (ref 20.0–50.0)
TIBC: 379.4 ug/dL (ref 250.0–450.0)
Transferrin: 271 mg/dL (ref 212.0–360.0)

## 2023-12-02 LAB — VITAMIN B12: Vitamin B-12: 1130 pg/mL — ABNORMAL HIGH (ref 211–911)

## 2023-12-02 NOTE — Telephone Encounter (Signed)
  Follow up Call-     12/01/2023    2:49 PM 06/30/2022   10:06 AM  Call back number  Post procedure Call Back phone  # [phone]  Permission to leave phone message Yes Yes     Patient questions:  Do you have a fever, pain , or abdominal swelling? No. Pain Score  0 *  Have you tolerated food without any problems? Yes.    Have you been able to return to your normal activities? Yes.    Do you have any questions about your discharge instructions: Diet   No. Medications  No. Follow up visit  No.  Do you have questions or concerns about your Care? No.  Actions: * If pain score is 4 or above: No action needed, pain <4.

## 2023-12-02 NOTE — Telephone Encounter (Signed)
 Spoke with Alisha Matthews in the lab & the machine has been down since last Friday, however it was fixed today and labs are in process now. She said remaining labs should be posted by the end of the day.

## 2023-12-02 NOTE — Telephone Encounter (Signed)
-----   Message from Truddie Furrow sent at 12/02/2023 10:33 AM EDT ----- Regarding: Lab for results from 11/26/2023 Pod B team -  Can you please contact the lab to follow-up lab results that were ordered by Mylinda Asa on 11/26/2023 for Ms. Budney?  They are listed as collected but there is no result which seems unusual since it has been several days since they were drawn.  I saw the patient for EGD yesterday and she was inquiring about her results.  Thanks,  Haskell Linker

## 2023-12-06 ENCOUNTER — Ambulatory Visit: Payer: Self-pay | Admitting: Pediatrics

## 2023-12-06 LAB — SURGICAL PATHOLOGY

## 2023-12-30 ENCOUNTER — Other Ambulatory Visit: Payer: Self-pay | Admitting: Family Medicine

## 2023-12-30 DIAGNOSIS — F4310 Post-traumatic stress disorder, unspecified: Secondary | ICD-10-CM

## 2024-01-01 ENCOUNTER — Other Ambulatory Visit: Payer: Self-pay | Admitting: Family Medicine

## 2024-01-01 DIAGNOSIS — F4310 Post-traumatic stress disorder, unspecified: Secondary | ICD-10-CM

## 2024-01-18 ENCOUNTER — Ambulatory Visit (INDEPENDENT_AMBULATORY_CARE_PROVIDER_SITE_OTHER)

## 2024-01-18 VITALS — Ht 63.0 in | Wt 132.0 lb

## 2024-01-18 DIAGNOSIS — Z0000 Encounter for general adult medical examination without abnormal findings: Secondary | ICD-10-CM

## 2024-01-18 NOTE — Progress Notes (Signed)
 Subjective:   Alisha Matthews is a 73 y.o. who presents for a Medicare Wellness preventive visit.  As a reminder, Annual Wellness Visits don't include a physical exam, and some assessments may be limited, especially if this visit is performed virtually. We may recommend an in-person follow-up visit with your provider if needed.  Visit Complete: Virtual I connected with  Alisha Matthews on 01/18/24 by a audio enabled telemedicine application and verified that I am speaking with the correct person using two identifiers.  Patient Location: Home  Provider Location: Home Office  I discussed the limitations of evaluation and management by telemedicine. The patient expressed understanding and agreed to proceed.  Vital Signs: Because this visit was a virtual/telehealth visit, some criteria may be missing or patient reported. Any vitals not documented were not able to be obtained and vitals that have been documented are patient reported.  VideoDeclined- This patient declined Librarian, academic. Therefore the visit was completed with audio only.  Persons Participating in Visit: Patient.  AWV Questionnaire: No: Patient Medicare AWV questionnaire was not completed prior to this visit.  Cardiac Risk Factors include: advanced age (>71men, >43 women);dyslipidemia     Objective:    Today's Vitals   01/18/24 1352  Weight: 132 lb (59.9 kg)  Height: 5' 3 (1.6 m)   Body mass index is 23.38 kg/m.     01/18/2024    2:04 PM 06/13/2023   12:42 PM 12/12/2021    3:43 PM 09/30/2021   11:14 AM 12/04/2020   10:36 AM 06/27/2020    6:07 AM 11/27/2019   11:12 AM  Advanced Directives  Does Patient Have a Medical Advance Directive? No Yes Yes Yes Yes No Yes  Type of Advance Directive  Living will;Healthcare Power of [employer] Power of [place];Out of facility DNR (pink MOST or yellow form);Living will Healthcare Power of [place];Living will Healthcare Power of [place];Living  will  Healthcare Power of [place];Living will  Does patient want to make changes to medical advance directive?   No - Patient declined    No - Patient declined  Copy of Healthcare Power of Attorney in Chart?   No - copy requested  No - copy requested  No - copy requested  Would patient like information on creating a medical advance directive? Yes (MAU/Ambulatory/Procedural Areas - Information given)     No - Patient declined     Current Medications (verified) Outpatient Encounter Medications as of 01/18/2024  Medication Sig   ALPRAZolam  (XANAX ) 0.25 MG tablet TAKE ONE TABLET BY MOUTH TWICE DAILY AS NEEDED FOR ANXIETY   atorvastatin  (LIPITOR) 20 MG tablet TAKE ONE TABLET BY MOUTH DAILY   Calcium -Magnesium -Vitamin D  (CALCIUM  500 PO) Take 500 mg by mouth 3 (three) times daily.   Coenzyme Q10 (COQ10) 200 MG CAPS Take 200 mg by mouth daily.   diclofenac  (VOLTAREN ) 75 MG EC tablet TAKE 1 TABLET BY MOUTH 2 TIMES DAILY   esomeprazole  (NEXIUM ) 40 MG capsule TAKE ONE CAPSULE BY MOUTH DAILY   ferrous sulfate 325 (65 FE) MG EC tablet Take 325 mg by mouth 3 (three) times daily with meals.   LevOCARNitine (CARNITINE PO) Take 3,000 mg by mouth daily.    metFORMIN  (GLUCOPHAGE -XR) 500 MG 24 hr tablet Take 1 tablet (500 mg total) by mouth 2 (two) times daily.   misoprostol  (CYTOTEC ) 200 MCG tablet TAKE 1 TABLET BY MOUTH 2 TIMES DAILY.   Multiple Vitamins-Minerals (ANTIOXIDANT PO) Take 1 capsule by mouth daily. Fruit  OVER THE COUNTER MEDICATION Take 1 capsule by mouth daily. Procaps package: Elite-100 omega 3 vitamin   OVER THE COUNTER MEDICATION Take 1 capsule by mouth daily. Procaps: Memory,brain and liver vitamin   OVER THE COUNTER MEDICATION Take 1 capsule by mouth daily. Circulation and vein support vitamin   PARoxetine  (PAXIL ) 20 MG tablet take ONE tablet by MOUTH daily   Probiotic Product (PROBIOTIC DAILY PO) Take 1 capsule by mouth daily.   promethazine  (PHENERGAN ) 12.5 MG tablet Take 1 tablet (12.5  mg total) by mouth every 8 (eight) hours as needed for nausea or vomiting.   traZODone  (DESYREL ) 50 MG tablet TAKE ONE-HALF TO ONE TABLET BY MOUTH AT BEDTIME AS NEEDED FOR SLEEP   Blood Glucose Monitoring Suppl (ACCU-CHEK GUIDE ME) w/Device KIT USE DAILY TO CHECK BLOOD SUGAR. DX E11.9 (Patient not taking: Reported on 12/01/2023)   budesonide  (ENTOCORT EC ) 3 MG 24 hr capsule Take 3 capsules (9 mg total) by mouth daily. (Patient not taking: Reported on 12/01/2023)   frovatriptan  (FROVA ) 2.5 MG tablet Take 1 tablet (2.5 mg total) by mouth as needed for migraine (max 3 tabs in 24 hours). If recurs, may repeat after 2 hours. Max of 3 tabs in 24 hours. (Patient not taking: Reported on 12/01/2023)   No facility-administered encounter medications on file as of 01/18/2024.    Allergies (verified) Penicillins   History: Past Medical History:  Diagnosis Date   Anxiety    Arthritis    Barrett's esophagus    Breast cancer (HCC)    right breast invasive ductal carcinoma    Breast fibrocystic disorder    Bronchitis    Cataract    Chicken pox    Constipation, chronic    Depressive disorder, not elsewhere classified    Diabetes mellitus    borderline   Exercise induced bronchospasm    Genital warts    GERD (gastroesophageal reflux disease)    Hyperlipidemia    Infected postoperative breast seroma    Insomnia, unspecified    Migraine, unspecified, without mention of intractable migraine without mention of status migrainosus    Obesity, unspecified    Osteoarthrosis, unspecified whether generalized or localized, ankle and foot    ankle/foot   Personal history of colonic polyps    Symptomatic menopausal or female climacteric states    Past Surgical History:  Procedure Laterality Date   BREAST LUMPECTOMY WITH NEEDLE LOCALIZATION AND AXILLARY SENTINEL LYMPH NODE BX  06/08/2012   Procedure: BREAST LUMPECTOMY WITH NEEDLE LOCALIZATION AND AXILLARY SENTINEL LYMPH NODE BX;  Surgeon: Donnice Bury,  MD;  Location: [facility];  Service: General;  Laterality: Right;   BREAST SURGERY     BUNIONECTOMY     3 surgeries on both feet   CATARACT EXTRACTION, BILATERAL     2022. 2023   CHOLECYSTECTOMY     COLONOSCOPY     dental implant     EYE SURGERY     Orbital right eye surgery   FOOT SURGERY     Left-revision   JOINT REPLACEMENT     NASAL SEPTUM SURGERY     RE-EXCISION OF BREAST CANCER,SUPERIOR MARGINS  06/27/2012   Procedure: RE-EXCISION OF BREAST CANCER,SUPERIOR MARGINS;  Surgeon: Donnice Bury, MD;  Location: [facility];  Service: General;  Laterality: Right;   REVERSE SHOULDER ARTHROPLASTY Left 10/09/2021   Procedure: REVERSE SHOULDER ARTHROPLASTY;  Surgeon: Dozier Soulier, MD;  Location: [facility];  Service: Orthopedics;  Laterality: Left;   TOTAL HIP ARTHROPLASTY  left      Perthes disease   TOTAL KNEE ARTHROPLASTY Left 02/20/2014   Procedure: TOTAL KNEE ARTHROPLASTY;  Surgeon: Maude KANDICE Herald, MD;  Location: [facility];  Service: Orthopedics;  Laterality: Left;   TOTAL SHOULDER ARTHROPLASTY Right 06/27/2020   Procedure: TOTAL SHOULDER ARTHROPLASTY;  Surgeon: Dozier Soulier, MD;  Location: [facility];  Service: Orthopedics;  Laterality: Right;   Family History  Adopted: Yes  Problem Relation Age of Onset   Hypertension Mother    Diabetes Mother    Heart failure Mother    Heart disease Mother    Hyperlipidemia Mother    Stroke Mother    Mental illness Mother    Cancer Maternal Grandmother 34       non hodgkins lymphoma   Cancer Maternal Grandfather    Deep vein thrombosis Brother    Social History   Socioeconomic History   Marital status: Married    Spouse name: Alm   Number of children: 0   Years of education: college   Highest education level: Not on file  Occupational History   Occupation: retired    Associate Professor: [employer]    Comment: previous CMA at [employer]  Tobacco Use   Smoking status: Never   Smokeless tobacco: Never  Vaping  Use   Vaping status: Never Used  Substance and Sexual Activity   Alcohol use: No    Comment: socially   Drug use: No   Sexual activity: Never    Partners: Male    Birth control/protection: Post-menopausal    Comment: post menopausal  Other Topics Concern   Not on file  Social History Narrative   Marital status:  Married x 19 years happily, second marriage. No domestic abuse; 5 cats; no children.      Lives: with husband, 5 cats.      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      Tobacco: none       Alcohol: none      Drugs: none      Exercise:  Stair stepper x 30 minutes five days per week.  Spin gym weights daily.  Gardening.      Caffeine: + Moderate caffeine use.          Seatbelt: 100%; no texting while driving.      Guns:  Unloaded gun.      Advanced Directives:  Living Will --- FULL CODE but no prolonged measures.        ADLs: independent with ADLs; no assistant devices; drives.   Social Drivers of Corporate investment banker Strain: Low Risk  (01/18/2024)   Overall Financial Resource Strain (CARDIA)    Difficulty of Paying Living Expenses: Not hard at all  Food Insecurity: No Food Insecurity (01/18/2024)   Hunger Vital Sign    Worried About Running Out of Food in the Last Year: Never true    Ran Out of Food in the Last Year: Never true  Transportation Needs: No Transportation Needs (01/18/2024)   PRAPARE - Administrator, Civil Service (Medical): No    Lack of Transportation (Non-Medical): No  Physical Activity: Sufficiently Active (01/18/2024)   Exercise Vital Sign    Days of Exercise per Week: 7 days    Minutes of Exercise per Session: 40 min  Stress: No Stress Concern Present (01/18/2024)   [employer] of Occupational Health - Occupational Stress Questionnaire    Feeling  of Stress: Not at all  Social Connections: Moderately Isolated (01/18/2024)   Social Connection and Isolation Panel    Frequency of  Communication with Friends and Family: More than three times a week    Frequency of Social Gatherings with Friends and Family: More than three times a week    Attends Religious Services: Never    Database administrator or Organizations: No    Attends Engineer, structural: Never    Marital Status: Married    Tobacco Counseling Counseling given: Not Answered    Clinical Intake:  Pre-visit preparation completed: Yes  Pain : No/denies pain  Diabetes: Yes CBG done?: No Did pt. bring in CBG monitor from home?: No  Lab Results  Component Value Date   HGBA1C 6.3 10/06/2023   HGBA1C 6.2 04/06/2023   HGBA1C 6.2 09/15/2022     How often do you need to have someone help you when you read instructions, pamphlets, or other written materials from your doctor or pharmacy?: 1 - Never  Interpreter Needed?: No  Information entered by :: Charmaine Bloodgood LPN   Activities of Daily Living     01/18/2024    2:04 PM  In your present state of health, do you have any difficulty performing the following activities:  Hearing? 0  Vision? 0  Difficulty concentrating or making decisions? 0  Walking or climbing stairs? 0  Dressing or bathing? 0  Doing errands, shopping? 0  Preparing Food and eating ? N  Using the Toilet? N  In the past six months, have you accidently leaked urine? N  Do you have problems with loss of bowel control? N  Managing your Medications? N  Managing your Finances? N  Housekeeping or managing your Housekeeping? N    Patient Care Team: Frann Mabel Mt, DO as PCP - General (Family Medicine) Octavia, Charlie Hamilton, MD as Consulting Physician (Ophthalmology) McGreal, Inocente HERO, MD as Consulting Physician (Gastroenterology)  I have updated your Care Teams any recent Medical Services you may have received from other providers in the past year.     Assessment:   This is a routine wellness examination for Alisha Matthews.  Hearing/Vision screen Hearing Screening -  Comments:: Denies hearing difficulties   Vision Screening - Comments:: Wears rx glasses - up to date with routine eye exams with Dr. Octavia    Goals Addressed             This Visit's Progress    Maintain healthy active lifestyle.   On track      Depression Screen     01/18/2024    2:02 PM 04/06/2023   10:11 AM 12/12/2021    3:44 PM 12/10/2021   10:24 AM 12/10/2021   10:23 AM 06/11/2021   10:46 AM 12/04/2020   10:38 AM  PHQ 2/9 Scores  PHQ - 2 Score 0 0 0 0 0 1 0  PHQ- 9 Score  0         Fall Risk     01/18/2024    2:04 PM 04/06/2023   10:10 AM 09/15/2022   10:21 AM 12/12/2021    3:44 PM 12/10/2021   10:23 AM  Fall Risk   Falls in the past year? 0 0 0 0 0  Number falls in past yr: 0 0 0 0 0  Injury with Fall? 0 0 0 0 0  Risk for fall due to : No Fall Risks No Fall Risks  No Fall Risks   Follow up Falls  prevention discussed;Education provided;Falls evaluation completed Falls evaluation completed  Falls evaluation completed       Data saved with a previous flowsheet row definition    MEDICARE RISK AT HOME:  Medicare Risk at Home Any stairs in or around the home?: Yes If so, are there any without handrails?: No Home free of loose throw rugs in walkways, pet beds, electrical cords, etc?: Yes Adequate lighting in your home to reduce risk of falls?: Yes Life alert?: No Use of a cane, walker or w/c?: No Grab bars in the bathroom?: Yes Shower chair or bench in shower?: No Elevated toilet seat or a handicapped toilet?: Yes  TIMED UP AND GO:  Was the test performed?  No  Cognitive Function: Declined/Normal: No cognitive concerns noted by patient or family. Patient alert, oriented, able to answer questions appropriately and recall recent events. No signs of memory loss or confusion.        12/12/2021    3:55 PM  6CIT Screen  What Year? 0 points  What month? 0 points  What time? 0 points  Count back from 20 0 points  Months in reverse 0 points  Repeat phrase 0  points  Total Score 0 points    Immunizations Immunization History  Administered Date(s) Administered   Fluad Quad(high Dose 65+) 03/01/2019   Influenza Inj Mdck Quad Pf 03/08/2023   Influenza Split 03/22/2012, 04/08/2015   Influenza, High Dose Seasonal PF 04/06/2018, 03/12/2020, 03/06/2021   Influenza,inj,Quad PF,6+ Mos 05/01/2013, 03/19/2014   Influenza-Unspecified 04/20/2016, 03/01/2019, 03/06/2021, 03/13/2022   Moderna Sars-Covid-2 Vaccination 08/24/2019, 09/21/2019, 04/16/2020, 01/09/2021   Pfizer(Comirnaty)Fall Seasonal Vaccine 12 years and older 03/15/2023   Pneumococcal Conjugate-13 07/09/2014   Pneumococcal Polysaccharide-23 06/22/2005, 09/10/2015   Tdap 08/30/2017, 06/13/2023   Zoster, Live 08/01/2013    Screening Tests Health Maintenance  Topic Date Due   Diabetic kidney evaluation - Urine ACR  Never done   COVID-19 Vaccine (6 - Moderna risk 2024-25 season) 06/21/2024 (Originally 09/12/2023)   INFLUENZA VACCINE  01/21/2024   FOOT EXAM  04/05/2024   HEMOGLOBIN A1C  04/06/2024   OPHTHALMOLOGY EXAM  08/02/2024   Diabetic kidney evaluation - eGFR measurement  11/25/2024   Medicare Annual Wellness (AWV)  01/17/2025   Colonoscopy  06/30/2032   DTaP/Tdap/Td (3 - Td or Tdap) 06/12/2033   Pneumococcal Vaccine: 50+ Years  Completed   Hepatitis C Screening  Completed   Hepatitis B Vaccines  Aged Out   HPV VACCINES  Aged Out   Meningococcal B Vaccine  Aged Out   DEXA SCAN  Discontinued   Zoster Vaccines- Shingrix  Discontinued    Health Maintenance  Health Maintenance Due  Topic Date Due   Diabetic kidney evaluation - Urine ACR  Never done    Additional Screening:  Vision Screening: Recommended annual ophthalmology exams for early detection of glaucoma and other disorders of the eye. Would you like a referral to an eye doctor? No    Dental Screening: Recommended annual dental exams for proper oral hygiene  Community Resource Referral / Chronic Care  Management: CRR required this visit?  No   CCM required this visit?  No   Plan:    I have personally reviewed and noted the following in the patient's chart:   Medical and social history Use of alcohol, tobacco or illicit drugs  Current medications and supplements including opioid prescriptions. Patient is not currently taking opioid prescriptions. Functional ability and status Nutritional status Physical activity Advanced directives List of other physicians Hospitalizations,  surgeries, and ER visits in previous 12 months Vitals Screenings to include cognitive, depression, and falls Referrals and appointments  In addition, I have reviewed and discussed with patient certain preventive protocols, quality metrics, and best practice recommendations. A written personalized care plan for preventive services as well as general preventive health recommendations were provided to patient.   Alisha Matthews, [place]   [id number]   After Visit Summary: (MyChart) Due to this being a telephonic visit, the after visit summary with patients personalized plan was offered to patient via MyChart   Notes: Nothing significant to report at this time.

## 2024-01-18 NOTE — Patient Instructions (Signed)
 Alisha Matthews , Thank you for taking time out of your busy schedule to complete your Annual Wellness Visit with me. I enjoyed our conversation and look forward to speaking with you again next year. I, as well as your care team,  appreciate your ongoing commitment to your health goals. Please review the following plan we discussed and let me know if I can assist you in the future. Your Game plan/ To Do List    Follow up Visits: Next Medicare AWV with our clinical staff: In 1 year    Have you seen your provider in the last 6 months (3 months if uncontrolled diabetes)? Yes Next Office Visit with your provider: 04/12/24 @ 11:00  Clinician Recommendations:  Aim for 30 minutes of exercise or brisk walking, 6-8 glasses of water , and 5 servings of fruits and vegetables each day.       This is a list of the screening recommended for you and due dates:  Health Maintenance  Topic Date Due   Yearly kidney health urinalysis for diabetes  Never done   COVID-19 Vaccine (6 - Moderna risk 2024-25 season) 06/21/2024*   Flu Shot  01/21/2024   Complete foot exam   04/05/2024   Hemoglobin A1C  04/06/2024   Eye exam for diabetics  08/02/2024   Yearly kidney function blood test for diabetes  11/25/2024   Medicare Annual Wellness Visit  01/17/2025   Colon Cancer Screening  06/30/2032   DTaP/Tdap/Td vaccine (3 - Td or Tdap) 06/12/2033   Pneumococcal Vaccine for age over 48  Completed   Hepatitis C Screening  Completed   Hepatitis B Vaccine  Aged Out   HPV Vaccine  Aged Out   Meningitis B Vaccine  Aged Out   DEXA scan (bone density measurement)  Discontinued   Zoster (Shingles) Vaccine  Discontinued  *Topic was postponed. The date shown is not the original due date.    Advanced directives: (ACP Link)Information on Advanced Care Planning can be found at [employer]  Secretary of [facility] Advance Health Care Directives Advance Health Care Directives. [email or web address]   Advance Care Planning is important because  it:  [x]  Makes sure you receive the medical care that is consistent with your values, goals, and preferences  [x]  It provides guidance to your family and loved ones and reduces their decisional burden about whether or not they are making the right decisions based on your wishes.  Follow the link provided in your after visit summary or read over the paperwork we have mailed to you to help you started getting your Advance Directives in place. If you need assistance in completing these, please reach out to us  so that we can help you!  See attachments for Preventive Care and Fall Prevention Tips.

## 2024-02-01 ENCOUNTER — Ambulatory Visit: Admitting: Pediatrics

## 2024-03-02 ENCOUNTER — Other Ambulatory Visit: Payer: Self-pay | Admitting: Family Medicine

## 2024-03-02 DIAGNOSIS — K219 Gastro-esophageal reflux disease without esophagitis: Secondary | ICD-10-CM

## 2024-03-06 DIAGNOSIS — Z23 Encounter for immunization: Secondary | ICD-10-CM | POA: Diagnosis not present

## 2024-03-29 ENCOUNTER — Other Ambulatory Visit: Payer: Self-pay | Admitting: Family Medicine

## 2024-03-29 DIAGNOSIS — F4310 Post-traumatic stress disorder, unspecified: Secondary | ICD-10-CM

## 2024-04-01 ENCOUNTER — Other Ambulatory Visit: Payer: Self-pay | Admitting: Family Medicine

## 2024-04-01 DIAGNOSIS — F4310 Post-traumatic stress disorder, unspecified: Secondary | ICD-10-CM

## 2024-04-07 DIAGNOSIS — L03031 Cellulitis of right toe: Secondary | ICD-10-CM | POA: Diagnosis not present

## 2024-04-08 ENCOUNTER — Other Ambulatory Visit: Payer: Self-pay | Admitting: Family Medicine

## 2024-04-08 DIAGNOSIS — F4310 Post-traumatic stress disorder, unspecified: Secondary | ICD-10-CM

## 2024-04-12 ENCOUNTER — Encounter: Payer: Self-pay | Admitting: Family Medicine

## 2024-04-12 ENCOUNTER — Ambulatory Visit: Payer: Self-pay | Admitting: Family Medicine

## 2024-04-12 ENCOUNTER — Ambulatory Visit: Admitting: Family Medicine

## 2024-04-12 VITALS — BP 105/66 | HR 79 | Temp 98.0°F | Resp 16 | Ht 63.0 in | Wt 130.0 lb

## 2024-04-12 DIAGNOSIS — Z79899 Other long term (current) drug therapy: Secondary | ICD-10-CM

## 2024-04-12 DIAGNOSIS — G5701 Lesion of sciatic nerve, right lower limb: Secondary | ICD-10-CM

## 2024-04-12 DIAGNOSIS — F32A Depression, unspecified: Secondary | ICD-10-CM | POA: Diagnosis not present

## 2024-04-12 DIAGNOSIS — Z7984 Long term (current) use of oral hypoglycemic drugs: Secondary | ICD-10-CM

## 2024-04-12 DIAGNOSIS — D509 Iron deficiency anemia, unspecified: Secondary | ICD-10-CM

## 2024-04-12 DIAGNOSIS — F419 Anxiety disorder, unspecified: Secondary | ICD-10-CM

## 2024-04-12 DIAGNOSIS — E1165 Type 2 diabetes mellitus with hyperglycemia: Secondary | ICD-10-CM | POA: Diagnosis not present

## 2024-04-12 LAB — COMPREHENSIVE METABOLIC PANEL WITH GFR
ALT: 19 U/L (ref 0–35)
AST: 20 U/L (ref 0–37)
Albumin: 4.1 g/dL (ref 3.5–5.2)
Alkaline Phosphatase: 54 U/L (ref 39–117)
BUN: 7 mg/dL (ref 6–23)
CO2: 26 meq/L (ref 19–32)
Calcium: 9 mg/dL (ref 8.4–10.5)
Chloride: 102 meq/L (ref 96–112)
Creatinine, Ser: 0.79 mg/dL (ref 0.40–1.20)
GFR: 73.2 mL/min (ref >60.00)
Glucose, Bld: 102 mg/dL — ABNORMAL HIGH (ref 70–99)
Potassium: 4.3 meq/L (ref 3.5–5.1)
Sodium: 135 meq/L (ref 135–145)
Total Bilirubin: 0.4 mg/dL (ref 0.2–1.2)
Total Protein: 6.2 g/dL (ref 6.0–8.3)

## 2024-04-12 LAB — HEMOGLOBIN A1C: Hgb A1c MFr Bld: 5.9 % (ref 4.6–6.5)

## 2024-04-12 LAB — LIPID PANEL
Cholesterol: 208 mg/dL — ABNORMAL HIGH (ref 0–200)
HDL: 77 mg/dL (ref >39.00)
LDL Cholesterol: 109 mg/dL — ABNORMAL HIGH (ref 0–99)
NonHDL: 131.04
Total CHOL/HDL Ratio: 3
Triglycerides: 108 mg/dL (ref 0.0–149.0)
VLDL: 21.6 mg/dL (ref 0.0–40.0)

## 2024-04-12 LAB — IBC + FERRITIN
Ferritin: 17.3 ng/mL (ref 10.0–291.0)
Iron: 126 ug/dL (ref 42–145)
Saturation Ratios: 39 % (ref 20.0–50.0)
TIBC: 323.4 ug/dL (ref 250.0–450.0)
Transferrin: 231 mg/dL (ref 212.0–360.0)

## 2024-04-12 LAB — CBC
HCT: 38.7 % (ref 36.0–46.0)
Hemoglobin: 12.7 g/dL (ref 12.0–15.0)
MCHC: 32.8 g/dL (ref 30.0–36.0)
MCV: 94 fl (ref 78.0–100.0)
Platelets: 232 K/uL (ref 150.0–400.0)
RBC: 4.11 Mil/uL (ref 3.87–5.11)
RDW: 12.8 % (ref 11.5–15.5)
WBC: 4.5 K/uL (ref 4.0–10.5)

## 2024-04-12 MED ORDER — CYCLOBENZAPRINE HCL 10 MG PO TABS: 5.0000 mg | ORAL_TABLET | Freq: Three times a day (TID) | ORAL | 0 refills | Status: AC | PRN

## 2024-04-12 NOTE — Patient Instructions (Addendum)
 Give us  2-3 business days to get the results of your labs back.   Keep the diet clean and stay active.  Heat (pad or rice pillow in microwave) over affected area, 10-15 minutes twice daily.   Ice/cold pack over area for 10-15 min twice daily.  OK to take Tylenol  1000 mg (2 extra strength tabs) or 975 mg (3 regular strength tabs) every 6 hours as needed.  Take Flexeril (cyclobenzaprine) 1-2 hours before planned bedtime. If it makes you drowsy, do not take during the day. You can try half a tab the following night.  Let us  know if you need anything.  Piriformis Syndrome Rehab It is normal to feel mild stretching, pulling, tightness, or discomfort as you do these exercises, but you should stop right away if you feel sudden pain or your pain gets worse.   Stretching and range of motion exercises These exercises warm up your muscles and joints and improve the movement and flexibility of your hip and pelvis. These exercises also help to relieve pain, numbness, and tingling. Exercise A: Hip rotators    Lie on your back on a firm surface. Pull your left / right knee toward your same shoulder with your left / right hand until your knee is pointing toward the ceiling. Hold your left / right ankle with your other hand. Keeping your knee steady, gently pull your left / right ankle toward your other shoulder until you feel a stretch in your buttocks. Hold this position for 30 seconds. Repeat 2 times. Complete this stretch 3 times per week. Exercise B: Hip extensors Lie on your back on a firm surface. Both of your legs should be straight. Pull your left / right knee to your chest. Hold your leg in this position by holding onto the back of your thigh or the front of your knee. Hold this position for 30 seconds. Slowly return to the starting position. Repeat 2 times. Complete this stretch 3 times per week.  Strengthening exercises These exercises build strength and endurance in your hip and thigh  muscles. Endurance is the ability to use your muscles for a long time, even after they get tired. Exercise C: Straight leg raises (hip abductors)     Lie on your side with your left / right leg in the top position. Lie so your head, shoulder, knee, and hip line up. Bend your bottom knee to help you balance. Lift your top leg up 4-6 inches (10-15 cm), keeping your toes pointed straight ahead. Hold this position for 1 second. Slowly lower your leg to the starting position. Let your muscles relax completely. Repeat for a total of 10 repetitions. Repeat 2 times. Complete this exercise 3 times per week. Exercise D: Hip abductors and rotators, quadruped    Get on your hands and knees on a firm, lightly padded surface. Your hands should be directly below your shoulders, and your knees should be directly below your hips. Lift your left / right knee out to the side. Keep your knee bent. Do not twist your body. Hold this position for 1 seconds. Slowly lower your leg. Repeat for a total of 10 repetitions.  Repeat 1 times. Complete this exercise 3 times per week. Exercise E: Straight leg raises (hip extensors) Lie on your abdomen on a bed or a firm surface with a pillow under your hips. Squeeze your buttock muscles and lift your left / right thigh off the bed. Do not let your back arch. Hold this position for  3 seconds. Slowly return to the starting position. Let your muscles relax completely before doing another repetition. Repeat 2 times. Complete this exercise 3 times per week.  This information is not intended to replace advice given to you by your health care provider. Make sure you discuss any questions you have with your health care provider. Document Released: 06/08/2005 Document Revised: 02/11/2016 Document Reviewed: 05/21/2015 Elsevier Interactive Patient Education  [employer].

## 2024-04-12 NOTE — Progress Notes (Signed)
 Subjective:   Chief Complaint  Patient presents with   Follow-up    Follow Up    Alisha Matthews is a 73 y.o. female here for follow-up of diabetes.   [name] does not routinely check her sugars Patient does not require insulin .   Medications include: Metformin  XR 500 mg twice daily Diet is fair.  Exercise: some stair stepper use, some walking No chest pain or shortness of breath  Anxiety/depression Pt is currently being treated with Paxil  20 mg daily, Xanax  0.25 mg twice daily as needed.  Reports doing well since treatment. No thoughts of harming self or others. No self-medication with alcohol, prescription drugs or illicit drugs. Pt is not following with a counselor/psychologist.  Patient has had right buttock pain over the past few months.  No injury or change activity.  No bruising, redness, swelling, neurologic signs or symptoms.  Rating down her right lower extremity.  She has tried doing some stretching without significant relief.  Past Medical History:  Diagnosis Date   Anxiety    Arthritis    Barrett's esophagus    Breast cancer (HCC)    right breast invasive ductal carcinoma    Breast fibrocystic disorder    Bronchitis    Cataract    Chicken pox    Constipation, chronic    Depressive disorder, not elsewhere classified    Diabetes mellitus    borderline   Exercise induced bronchospasm    Genital warts    GERD (gastroesophageal reflux disease)    Hyperlipidemia    Infected postoperative breast seroma    Insomnia, unspecified    Migraine, unspecified, without mention of intractable migraine without mention of status migrainosus    Obesity, unspecified    Osteoarthrosis, unspecified whether generalized or localized, ankle and foot    ankle/foot   Personal history of colonic polyps    Symptomatic menopausal or female climacteric states      Related testing: Retinal exam: Done Pneumovax: done  Objective:  BP 105/66 (BP Location: Left Arm, Patient Position:  Sitting)   Pulse 79   Temp 98 F (36.7 C) (Oral)   Resp 16   Ht 5' 3 (1.6 m)   Wt 130 lb (59 kg)   LMP 06/22/2000   SpO2 98%   BMI 23.03 kg/m  General:  Well developed, well nourished, in no apparent distress Lungs:  CTAB, no access msc use Cardio:  RRR, no bruits, no LE edema Musculoskeletal: TTP over external hip rotators on the right No TTP over the greater trochanteric region, negative Ober's, there is no pain with resisted hip abduction Neuro: Gait is normal Psych: Age appropriate judgment and insight  Assessment:   Type 2 diabetes mellitus with hyperglycemia, without long-term current use of insulin  (HCC) - Plan: Comprehensive metabolic panel with GFR, Lipid panel, Hemoglobin A1c, Microalbumin / creatinine urine ratio  Anxiety and depression  Piriformis syndrome of right side  High risk medication use - Plan: Drug Monitoring Panel [phone] , Urine   Plan:   Chronic, stable.  Continue metformin  XR 500 mg twice daily.  Counseled on diet and exercise. Chronic, stable.  Continue Paxil  20 mg daily, Xanax  0.25 mg twice daily as needed. Heat, ice, Tylenol , stretches/exercises.  Update CSC and UDS. F/u in 6 mo. The patient voiced understanding and agreement to the plan.  Mabel [place], DO 04/12/24 11:21 AM

## 2024-04-13 LAB — MICROALBUMIN / CREATININE URINE RATIO
Creatinine,U: 69.1 mg/dL
Microalb Creat Ratio: UNDETERMINED mg/g (ref 0.0–30.0)
Microalb, Ur: <0.7 mg/dL

## 2024-04-14 LAB — DRUG MONITORING PANEL 376104, URINE

## 2024-04-14 LAB — DM TEMPLATE

## 2024-05-12 ENCOUNTER — Other Ambulatory Visit: Payer: Self-pay | Admitting: Family Medicine

## 2024-05-12 DIAGNOSIS — E1165 Type 2 diabetes mellitus with hyperglycemia: Secondary | ICD-10-CM

## 2024-06-17 ENCOUNTER — Other Ambulatory Visit: Payer: Self-pay | Admitting: Family Medicine

## 2024-06-17 DIAGNOSIS — F4310 Post-traumatic stress disorder, unspecified: Secondary | ICD-10-CM

## 2024-07-03 LAB — HM MAMMOGRAPHY

## 2024-07-08 ENCOUNTER — Other Ambulatory Visit: Payer: Self-pay | Admitting: Family Medicine

## 2024-07-08 DIAGNOSIS — F4310 Post-traumatic stress disorder, unspecified: Secondary | ICD-10-CM

## 2024-07-25 ENCOUNTER — Encounter: Payer: Self-pay | Admitting: Family Medicine

## 2024-10-10 ENCOUNTER — Ambulatory Visit: Admitting: Family Medicine

## 2025-01-23 ENCOUNTER — Ambulatory Visit
